# Patient Record
Sex: Female | Born: 1950 | Race: White | Hispanic: No | Marital: Married | Smoking: Former smoker
Health system: Southern US, Community
[De-identification: ages and names within clinical notes are randomized; demographics above are authoritative.]

## PROBLEM LIST (undated history)

## (undated) DIAGNOSIS — K219 Gastro-esophageal reflux disease without esophagitis: Secondary | ICD-10-CM

## (undated) DIAGNOSIS — T7840XA Allergy, unspecified, initial encounter: Secondary | ICD-10-CM

## (undated) DIAGNOSIS — Z87442 Personal history of urinary calculi: Secondary | ICD-10-CM

## (undated) DIAGNOSIS — F329 Major depressive disorder, single episode, unspecified: Secondary | ICD-10-CM

## (undated) DIAGNOSIS — J439 Emphysema, unspecified: Secondary | ICD-10-CM

## (undated) DIAGNOSIS — M797 Fibromyalgia: Secondary | ICD-10-CM

## (undated) DIAGNOSIS — M1990 Unspecified osteoarthritis, unspecified site: Secondary | ICD-10-CM

## (undated) DIAGNOSIS — F32A Depression, unspecified: Secondary | ICD-10-CM

## (undated) DIAGNOSIS — A6000 Herpesviral infection of urogenital system, unspecified: Secondary | ICD-10-CM

## (undated) DIAGNOSIS — Z8619 Personal history of other infectious and parasitic diseases: Secondary | ICD-10-CM

## (undated) DIAGNOSIS — K220 Achalasia of cardia: Secondary | ICD-10-CM

## (undated) HISTORY — DX: Gastro-esophageal reflux disease without esophagitis: K21.9

## (undated) HISTORY — DX: Emphysema, unspecified: J43.9

## (undated) HISTORY — DX: Allergy, unspecified, initial encounter: T78.40XA

## (undated) HISTORY — PX: LAPAROSCOPY: SHX197

---

## 1997-03-17 DIAGNOSIS — M159 Polyosteoarthritis, unspecified: Secondary | ICD-10-CM | POA: Insufficient documentation

## 1999-12-23 ENCOUNTER — Ambulatory Visit (HOSPITAL_COMMUNITY): Admission: RE | Admit: 1999-12-23 | Discharge: 1999-12-23 | Payer: Self-pay | Admitting: Neurosurgery

## 1999-12-23 ENCOUNTER — Encounter: Payer: Self-pay | Admitting: Neurosurgery

## 2004-01-02 ENCOUNTER — Ambulatory Visit: Payer: Self-pay | Admitting: Pain Medicine

## 2004-01-25 ENCOUNTER — Ambulatory Visit: Payer: Self-pay | Admitting: Pain Medicine

## 2004-02-28 ENCOUNTER — Ambulatory Visit: Payer: Self-pay | Admitting: Pain Medicine

## 2004-03-21 ENCOUNTER — Ambulatory Visit: Payer: Self-pay | Admitting: Pain Medicine

## 2004-04-23 ENCOUNTER — Ambulatory Visit: Payer: Self-pay | Admitting: Pain Medicine

## 2004-05-01 ENCOUNTER — Ambulatory Visit: Payer: Self-pay | Admitting: Pain Medicine

## 2004-05-23 ENCOUNTER — Ambulatory Visit: Payer: Self-pay | Admitting: Pain Medicine

## 2004-05-29 ENCOUNTER — Ambulatory Visit: Payer: Self-pay | Admitting: Pain Medicine

## 2004-06-20 ENCOUNTER — Ambulatory Visit: Payer: Self-pay | Admitting: Pain Medicine

## 2004-07-18 ENCOUNTER — Ambulatory Visit: Payer: Self-pay | Admitting: Pain Medicine

## 2004-08-05 ENCOUNTER — Ambulatory Visit: Payer: Self-pay | Admitting: Pain Medicine

## 2004-08-22 ENCOUNTER — Ambulatory Visit: Payer: Self-pay | Admitting: Pain Medicine

## 2004-08-28 ENCOUNTER — Ambulatory Visit: Payer: Self-pay | Admitting: Pain Medicine

## 2004-09-19 ENCOUNTER — Ambulatory Visit: Payer: Self-pay | Admitting: Pain Medicine

## 2004-10-17 ENCOUNTER — Ambulatory Visit: Payer: Self-pay | Admitting: Pain Medicine

## 2004-11-28 ENCOUNTER — Ambulatory Visit: Payer: Self-pay | Admitting: Pain Medicine

## 2004-12-23 ENCOUNTER — Ambulatory Visit: Payer: Self-pay | Admitting: Pain Medicine

## 2005-01-21 ENCOUNTER — Ambulatory Visit: Payer: Self-pay | Admitting: Pain Medicine

## 2005-02-26 ENCOUNTER — Ambulatory Visit: Payer: Self-pay | Admitting: Pain Medicine

## 2005-04-03 ENCOUNTER — Ambulatory Visit: Payer: Self-pay | Admitting: Pain Medicine

## 2005-05-06 ENCOUNTER — Ambulatory Visit: Payer: Self-pay | Admitting: Pain Medicine

## 2005-05-28 ENCOUNTER — Ambulatory Visit: Payer: Self-pay | Admitting: Pain Medicine

## 2005-07-01 ENCOUNTER — Ambulatory Visit: Payer: Self-pay | Admitting: Pain Medicine

## 2005-07-04 DIAGNOSIS — I959 Hypotension, unspecified: Secondary | ICD-10-CM | POA: Insufficient documentation

## 2005-07-24 ENCOUNTER — Ambulatory Visit: Payer: Self-pay | Admitting: Pain Medicine

## 2005-08-26 ENCOUNTER — Ambulatory Visit: Payer: Self-pay | Admitting: Pain Medicine

## 2005-09-25 ENCOUNTER — Ambulatory Visit: Payer: Self-pay | Admitting: Pain Medicine

## 2005-10-21 ENCOUNTER — Ambulatory Visit: Payer: Self-pay | Admitting: Pain Medicine

## 2005-11-27 ENCOUNTER — Emergency Department: Payer: Self-pay | Admitting: Emergency Medicine

## 2005-11-27 ENCOUNTER — Other Ambulatory Visit: Payer: Self-pay

## 2005-12-01 DIAGNOSIS — H8110 Benign paroxysmal vertigo, unspecified ear: Secondary | ICD-10-CM | POA: Insufficient documentation

## 2005-12-02 ENCOUNTER — Ambulatory Visit: Payer: Self-pay | Admitting: Pain Medicine

## 2005-12-22 ENCOUNTER — Ambulatory Visit: Payer: Self-pay | Admitting: Pain Medicine

## 2006-01-20 ENCOUNTER — Ambulatory Visit: Payer: Self-pay | Admitting: Pain Medicine

## 2006-01-26 ENCOUNTER — Ambulatory Visit: Payer: Self-pay | Admitting: Pain Medicine

## 2006-03-05 ENCOUNTER — Ambulatory Visit: Payer: Self-pay | Admitting: Pain Medicine

## 2006-04-13 ENCOUNTER — Ambulatory Visit: Payer: Self-pay | Admitting: Pain Medicine

## 2006-05-05 ENCOUNTER — Ambulatory Visit: Payer: Self-pay | Admitting: Pain Medicine

## 2006-06-09 ENCOUNTER — Ambulatory Visit: Payer: Self-pay | Admitting: Pain Medicine

## 2006-06-29 ENCOUNTER — Ambulatory Visit: Payer: Self-pay | Admitting: Pain Medicine

## 2006-07-16 ENCOUNTER — Ambulatory Visit: Payer: Self-pay | Admitting: Pain Medicine

## 2006-08-13 ENCOUNTER — Ambulatory Visit: Payer: Self-pay | Admitting: Pain Medicine

## 2006-09-07 ENCOUNTER — Ambulatory Visit: Payer: Self-pay

## 2006-09-10 ENCOUNTER — Ambulatory Visit: Payer: Self-pay | Admitting: Pain Medicine

## 2006-10-05 ENCOUNTER — Ambulatory Visit: Payer: Self-pay | Admitting: Pain Medicine

## 2006-11-10 ENCOUNTER — Ambulatory Visit: Payer: Self-pay | Admitting: Pain Medicine

## 2006-12-15 ENCOUNTER — Ambulatory Visit: Payer: Self-pay | Admitting: Pain Medicine

## 2006-12-29 DIAGNOSIS — F419 Anxiety disorder, unspecified: Secondary | ICD-10-CM | POA: Insufficient documentation

## 2007-01-12 ENCOUNTER — Ambulatory Visit: Payer: Self-pay | Admitting: Pain Medicine

## 2007-02-09 ENCOUNTER — Ambulatory Visit: Payer: Self-pay | Admitting: Pain Medicine

## 2007-03-16 ENCOUNTER — Ambulatory Visit: Payer: Self-pay | Admitting: Pain Medicine

## 2007-04-14 ENCOUNTER — Ambulatory Visit: Payer: Self-pay | Admitting: Pain Medicine

## 2007-05-25 ENCOUNTER — Ambulatory Visit: Payer: Self-pay | Admitting: Pain Medicine

## 2007-06-24 ENCOUNTER — Ambulatory Visit: Payer: Self-pay | Admitting: Pain Medicine

## 2007-07-28 ENCOUNTER — Ambulatory Visit: Payer: Self-pay | Admitting: Pain Medicine

## 2007-08-24 ENCOUNTER — Ambulatory Visit: Payer: Self-pay | Admitting: Pain Medicine

## 2007-09-28 ENCOUNTER — Ambulatory Visit: Payer: Self-pay | Admitting: Pain Medicine

## 2007-10-26 ENCOUNTER — Ambulatory Visit: Payer: Self-pay | Admitting: Pain Medicine

## 2007-11-23 ENCOUNTER — Ambulatory Visit: Payer: Self-pay | Admitting: Pain Medicine

## 2007-12-21 ENCOUNTER — Ambulatory Visit: Payer: Self-pay | Admitting: Pain Medicine

## 2008-01-20 ENCOUNTER — Ambulatory Visit: Payer: Self-pay | Admitting: Pain Medicine

## 2008-01-28 ENCOUNTER — Emergency Department: Payer: Self-pay | Admitting: Unknown Physician Specialty

## 2008-03-14 ENCOUNTER — Ambulatory Visit: Payer: Self-pay | Admitting: Pain Medicine

## 2008-04-11 ENCOUNTER — Ambulatory Visit: Payer: Self-pay | Admitting: Pain Medicine

## 2008-05-11 ENCOUNTER — Ambulatory Visit: Payer: Self-pay | Admitting: Pain Medicine

## 2008-06-08 ENCOUNTER — Ambulatory Visit: Payer: Self-pay | Admitting: Pain Medicine

## 2008-07-11 ENCOUNTER — Ambulatory Visit: Payer: Self-pay | Admitting: Pain Medicine

## 2008-08-08 ENCOUNTER — Ambulatory Visit: Payer: Self-pay | Admitting: Pain Medicine

## 2008-08-09 ENCOUNTER — Ambulatory Visit: Payer: Self-pay | Admitting: Pain Medicine

## 2008-09-07 ENCOUNTER — Ambulatory Visit: Payer: Self-pay | Admitting: Pain Medicine

## 2008-10-04 ENCOUNTER — Ambulatory Visit: Payer: Self-pay | Admitting: Pain Medicine

## 2008-10-16 ENCOUNTER — Ambulatory Visit: Payer: Self-pay | Admitting: Pain Medicine

## 2008-12-05 ENCOUNTER — Ambulatory Visit: Payer: Self-pay | Admitting: Pain Medicine

## 2008-12-26 ENCOUNTER — Ambulatory Visit: Payer: Self-pay | Admitting: Pain Medicine

## 2009-02-06 ENCOUNTER — Ambulatory Visit: Payer: Self-pay | Admitting: Pain Medicine

## 2009-03-06 ENCOUNTER — Ambulatory Visit: Payer: Self-pay | Admitting: Pain Medicine

## 2009-04-04 ENCOUNTER — Ambulatory Visit: Payer: Self-pay | Admitting: Pain Medicine

## 2009-05-03 ENCOUNTER — Ambulatory Visit: Payer: Self-pay | Admitting: Pain Medicine

## 2009-05-31 ENCOUNTER — Ambulatory Visit: Payer: Self-pay | Admitting: Pain Medicine

## 2009-06-04 ENCOUNTER — Ambulatory Visit: Payer: Self-pay | Admitting: Pain Medicine

## 2009-06-28 ENCOUNTER — Ambulatory Visit: Payer: Self-pay | Admitting: Pain Medicine

## 2009-07-23 ENCOUNTER — Ambulatory Visit: Payer: Self-pay | Admitting: Internal Medicine

## 2009-08-02 ENCOUNTER — Ambulatory Visit: Payer: Self-pay | Admitting: Pain Medicine

## 2009-08-30 ENCOUNTER — Ambulatory Visit: Payer: Self-pay | Admitting: Pain Medicine

## 2009-09-12 ENCOUNTER — Ambulatory Visit: Payer: Self-pay | Admitting: Pain Medicine

## 2009-10-02 ENCOUNTER — Ambulatory Visit: Payer: Self-pay | Admitting: Pain Medicine

## 2009-10-24 ENCOUNTER — Ambulatory Visit: Payer: Self-pay | Admitting: Pain Medicine

## 2009-11-27 ENCOUNTER — Ambulatory Visit: Payer: Self-pay | Admitting: Pain Medicine

## 2009-12-05 ENCOUNTER — Ambulatory Visit: Payer: Self-pay | Admitting: Pain Medicine

## 2010-01-24 ENCOUNTER — Ambulatory Visit: Payer: Self-pay | Admitting: Pain Medicine

## 2010-01-24 ENCOUNTER — Ambulatory Visit: Payer: Self-pay | Admitting: Family Medicine

## 2010-01-29 ENCOUNTER — Ambulatory Visit: Payer: Self-pay | Admitting: Internal Medicine

## 2010-02-28 ENCOUNTER — Ambulatory Visit: Payer: Self-pay | Admitting: Pain Medicine

## 2010-03-28 ENCOUNTER — Ambulatory Visit: Payer: Self-pay | Admitting: Pain Medicine

## 2010-04-30 ENCOUNTER — Ambulatory Visit: Payer: Self-pay | Admitting: Pain Medicine

## 2010-05-20 ENCOUNTER — Ambulatory Visit: Payer: Self-pay | Admitting: Pain Medicine

## 2010-06-25 ENCOUNTER — Ambulatory Visit: Payer: Self-pay | Admitting: Pain Medicine

## 2010-07-25 ENCOUNTER — Ambulatory Visit: Payer: Self-pay | Admitting: Pain Medicine

## 2010-08-19 ENCOUNTER — Ambulatory Visit: Payer: Self-pay | Admitting: Pain Medicine

## 2010-09-24 ENCOUNTER — Ambulatory Visit: Payer: Self-pay | Admitting: Pain Medicine

## 2010-10-02 ENCOUNTER — Ambulatory Visit: Payer: Self-pay | Admitting: Pain Medicine

## 2010-10-24 ENCOUNTER — Ambulatory Visit: Payer: Self-pay | Admitting: Pain Medicine

## 2010-10-28 ENCOUNTER — Ambulatory Visit: Payer: Self-pay | Admitting: Pain Medicine

## 2010-11-19 ENCOUNTER — Ambulatory Visit: Payer: Self-pay | Admitting: Pain Medicine

## 2010-12-17 ENCOUNTER — Ambulatory Visit: Payer: Self-pay | Admitting: Pain Medicine

## 2010-12-25 ENCOUNTER — Ambulatory Visit: Payer: Self-pay | Admitting: Pain Medicine

## 2010-12-26 ENCOUNTER — Ambulatory Visit: Payer: Self-pay | Admitting: Family Medicine

## 2011-01-03 ENCOUNTER — Ambulatory Visit: Payer: Self-pay | Admitting: Specialist

## 2011-01-21 ENCOUNTER — Ambulatory Visit: Payer: Self-pay | Admitting: Pain Medicine

## 2011-02-20 ENCOUNTER — Ambulatory Visit: Payer: Self-pay | Admitting: Pain Medicine

## 2011-03-25 ENCOUNTER — Ambulatory Visit: Payer: Self-pay | Admitting: Pain Medicine

## 2011-04-25 ENCOUNTER — Ambulatory Visit: Payer: Self-pay | Admitting: Pain Medicine

## 2011-05-21 ENCOUNTER — Ambulatory Visit: Payer: Self-pay | Admitting: Pain Medicine

## 2011-06-17 ENCOUNTER — Ambulatory Visit: Payer: Self-pay | Admitting: Pain Medicine

## 2011-06-30 ENCOUNTER — Ambulatory Visit: Payer: Self-pay | Admitting: Pain Medicine

## 2011-07-22 ENCOUNTER — Ambulatory Visit: Payer: Self-pay | Admitting: Pain Medicine

## 2011-08-21 ENCOUNTER — Ambulatory Visit: Payer: Self-pay | Admitting: Pain Medicine

## 2011-09-16 ENCOUNTER — Ambulatory Visit: Payer: Self-pay | Admitting: Pain Medicine

## 2011-10-16 ENCOUNTER — Ambulatory Visit: Payer: Self-pay | Admitting: Pain Medicine

## 2011-11-18 ENCOUNTER — Ambulatory Visit: Payer: Self-pay | Admitting: Pain Medicine

## 2011-12-18 ENCOUNTER — Ambulatory Visit: Payer: Self-pay | Admitting: Pain Medicine

## 2012-01-15 ENCOUNTER — Ambulatory Visit: Payer: Self-pay | Admitting: Pain Medicine

## 2012-02-17 ENCOUNTER — Ambulatory Visit: Payer: Self-pay | Admitting: Pain Medicine

## 2012-03-16 ENCOUNTER — Ambulatory Visit: Payer: Self-pay | Admitting: Pain Medicine

## 2012-04-15 ENCOUNTER — Ambulatory Visit: Payer: Self-pay | Admitting: Pain Medicine

## 2012-05-17 ENCOUNTER — Ambulatory Visit: Payer: Self-pay | Admitting: Pain Medicine

## 2012-06-08 ENCOUNTER — Ambulatory Visit: Payer: Self-pay | Admitting: Pain Medicine

## 2012-07-15 ENCOUNTER — Ambulatory Visit: Payer: Self-pay | Admitting: Pain Medicine

## 2012-08-16 ENCOUNTER — Ambulatory Visit: Payer: Self-pay | Admitting: Pain Medicine

## 2012-08-27 ENCOUNTER — Ambulatory Visit: Payer: Self-pay | Admitting: Specialist

## 2012-09-14 ENCOUNTER — Ambulatory Visit: Payer: Self-pay | Admitting: Pain Medicine

## 2012-10-13 ENCOUNTER — Ambulatory Visit: Payer: Self-pay | Admitting: Pain Medicine

## 2012-11-09 ENCOUNTER — Ambulatory Visit: Payer: Self-pay | Admitting: Pain Medicine

## 2012-12-07 ENCOUNTER — Ambulatory Visit: Payer: Self-pay | Admitting: Pain Medicine

## 2012-12-27 ENCOUNTER — Ambulatory Visit: Payer: Self-pay | Admitting: Pain Medicine

## 2013-01-04 ENCOUNTER — Ambulatory Visit: Payer: Self-pay | Admitting: Pain Medicine

## 2013-02-03 ENCOUNTER — Ambulatory Visit: Payer: Self-pay | Admitting: Pain Medicine

## 2013-03-07 ENCOUNTER — Ambulatory Visit: Payer: Self-pay | Admitting: Pain Medicine

## 2013-04-06 ENCOUNTER — Ambulatory Visit: Payer: Self-pay | Admitting: Pain Medicine

## 2013-04-18 ENCOUNTER — Ambulatory Visit: Payer: Self-pay | Admitting: Pain Medicine

## 2013-05-05 ENCOUNTER — Ambulatory Visit: Payer: Self-pay | Admitting: Pain Medicine

## 2013-05-09 ENCOUNTER — Ambulatory Visit: Payer: Self-pay | Admitting: Unknown Physician Specialty

## 2013-05-10 DIAGNOSIS — D126 Benign neoplasm of colon, unspecified: Secondary | ICD-10-CM | POA: Insufficient documentation

## 2013-05-11 LAB — PATHOLOGY REPORT

## 2013-06-02 ENCOUNTER — Ambulatory Visit: Payer: Self-pay | Admitting: Pain Medicine

## 2013-06-02 ENCOUNTER — Ambulatory Visit: Payer: Self-pay | Admitting: Unknown Physician Specialty

## 2013-06-15 ENCOUNTER — Ambulatory Visit: Payer: Self-pay | Admitting: Surgery

## 2013-06-22 ENCOUNTER — Ambulatory Visit: Payer: Self-pay | Admitting: Gastroenterology

## 2013-06-29 DIAGNOSIS — K220 Achalasia of cardia: Secondary | ICD-10-CM | POA: Insufficient documentation

## 2013-07-05 ENCOUNTER — Ambulatory Visit: Payer: Self-pay | Admitting: Pain Medicine

## 2013-07-06 ENCOUNTER — Ambulatory Visit: Payer: Self-pay | Admitting: Pain Medicine

## 2013-08-04 ENCOUNTER — Ambulatory Visit: Payer: Self-pay | Admitting: Pain Medicine

## 2013-08-18 DIAGNOSIS — M1990 Unspecified osteoarthritis, unspecified site: Secondary | ICD-10-CM | POA: Insufficient documentation

## 2013-08-24 DIAGNOSIS — M797 Fibromyalgia: Secondary | ICD-10-CM | POA: Insufficient documentation

## 2013-08-24 DIAGNOSIS — F329 Major depressive disorder, single episode, unspecified: Secondary | ICD-10-CM | POA: Insufficient documentation

## 2013-08-24 DIAGNOSIS — J449 Chronic obstructive pulmonary disease, unspecified: Secondary | ICD-10-CM | POA: Insufficient documentation

## 2013-08-24 DIAGNOSIS — F32A Depression, unspecified: Secondary | ICD-10-CM | POA: Insufficient documentation

## 2013-09-01 ENCOUNTER — Ambulatory Visit: Payer: Self-pay | Admitting: Pain Medicine

## 2013-09-29 ENCOUNTER — Ambulatory Visit: Payer: Self-pay | Admitting: Pain Medicine

## 2013-10-05 ENCOUNTER — Ambulatory Visit: Payer: Self-pay | Admitting: Pain Medicine

## 2013-10-27 ENCOUNTER — Ambulatory Visit: Payer: Self-pay | Admitting: Pain Medicine

## 2013-11-24 ENCOUNTER — Ambulatory Visit: Payer: Self-pay | Admitting: Pain Medicine

## 2013-12-22 ENCOUNTER — Ambulatory Visit: Payer: Self-pay | Admitting: Pain Medicine

## 2014-01-24 ENCOUNTER — Ambulatory Visit: Payer: Self-pay | Admitting: Pain Medicine

## 2014-02-23 ENCOUNTER — Ambulatory Visit: Payer: Self-pay | Admitting: Pain Medicine

## 2014-03-28 ENCOUNTER — Ambulatory Visit: Payer: Self-pay | Admitting: Pain Medicine

## 2014-04-10 ENCOUNTER — Ambulatory Visit: Payer: Self-pay | Admitting: Pain Medicine

## 2014-04-27 ENCOUNTER — Ambulatory Visit: Payer: Self-pay | Admitting: Pain Medicine

## 2014-05-22 ENCOUNTER — Ambulatory Visit: Payer: Self-pay | Admitting: Pain Medicine

## 2014-05-29 ENCOUNTER — Ambulatory Visit: Payer: Self-pay | Admitting: Pain Medicine

## 2014-06-20 ENCOUNTER — Ambulatory Visit
Admit: 2014-06-20 | Disposition: A | Discharge status: Home / Self Care | Payer: Self-pay | Attending: Pain Medicine | Admitting: Pain Medicine

## 2014-07-18 ENCOUNTER — Encounter: Payer: Self-pay | Admitting: Pain Medicine

## 2014-07-18 ENCOUNTER — Ambulatory Visit: Payer: BC Managed Care – PPO | Attending: Pain Medicine | Admitting: Pain Medicine

## 2014-07-18 VITALS — BP 133/66 | HR 75 | Temp 98.1°F | Resp 18 | Ht 65.0 in | Wt 150.0 lb

## 2014-07-18 DIAGNOSIS — M5135 Other intervertebral disc degeneration, thoracolumbar region: Secondary | ICD-10-CM | POA: Insufficient documentation

## 2014-07-18 DIAGNOSIS — M51369 Other intervertebral disc degeneration, lumbar region without mention of lumbar back pain or lower extremity pain: Secondary | ICD-10-CM

## 2014-07-18 DIAGNOSIS — M19011 Primary osteoarthritis, right shoulder: Secondary | ICD-10-CM

## 2014-07-18 DIAGNOSIS — M19019 Primary osteoarthritis, unspecified shoulder: Secondary | ICD-10-CM | POA: Diagnosis not present

## 2014-07-18 DIAGNOSIS — M5134 Other intervertebral disc degeneration, thoracic region: Secondary | ICD-10-CM | POA: Diagnosis not present

## 2014-07-18 DIAGNOSIS — M79605 Pain in left leg: Secondary | ICD-10-CM | POA: Diagnosis present

## 2014-07-18 DIAGNOSIS — M170 Bilateral primary osteoarthritis of knee: Secondary | ICD-10-CM

## 2014-07-18 DIAGNOSIS — M1710 Unilateral primary osteoarthritis, unspecified knee: Secondary | ICD-10-CM | POA: Insufficient documentation

## 2014-07-18 DIAGNOSIS — M5136 Other intervertebral disc degeneration, lumbar region: Secondary | ICD-10-CM | POA: Diagnosis not present

## 2014-07-18 DIAGNOSIS — M19012 Primary osteoarthritis, left shoulder: Secondary | ICD-10-CM

## 2014-07-18 DIAGNOSIS — M1990 Unspecified osteoarthritis, unspecified site: Secondary | ICD-10-CM | POA: Insufficient documentation

## 2014-07-18 DIAGNOSIS — M179 Osteoarthritis of knee, unspecified: Secondary | ICD-10-CM | POA: Insufficient documentation

## 2014-07-18 MED ORDER — OXYCODONE HCL 5 MG PO TABS: 5.0000 mg | ORAL_TABLET | ORAL | Status: DC | PRN

## 2014-07-18 MED ORDER — OXYCODONE HCL ER 10 MG PO T12A: 10.0000 mg | EXTENDED_RELEASE_TABLET | Freq: Every morning | ORAL | Status: DC

## 2014-07-18 MED ORDER — OXYCODONE HCL ER 10 MG PO T12A: 10.0000 mg | EXTENDED_RELEASE_TABLET | Freq: Three times a day (TID) | ORAL | Status: DC

## 2014-07-18 NOTE — Progress Notes (Signed)
   Subjective:    Patient ID: Katrina Simon, female    DOB: 1950-04-22, 64 y.o.   MRN: [id number]  Knee Pain  The incident occurred more than 1 week ago. There was no injury mechanism. The pain is present in the left leg, left hip, left thigh, left knee, left ankle, left heel, right hip, right thigh, right knee, left foot, left toes, right leg, right ankle, right heel, right foot and right toes. The quality of the pain is described as aching. The pain is at a severity of 6/10. The pain is moderate. The pain has been constant since onset. Associated symptoms include an inability to bear weight, a loss of motion and numbness. The symptoms are aggravated by movement, palpation and weight bearing. She has tried acetaminophen, immobilization, elevation, heat, ice, non-weight bearing, NSAIDs and rest for the symptoms. The treatment provided moderate relief.      Review of Systems  Cardiovascular: Positive for leg swelling.  Musculoskeletal: Positive for myalgias, back pain, joint swelling, arthralgias and gait problem.  Neurological: Positive for weakness and numbness.       Objective:   Physical Exam  Constitutional: She is oriented to person, place, and time.  HENT:  Head: Normocephalic.  Cardiovascular: Normal rate.   Musculoskeletal: She exhibits edema and tenderness.  Neurological: She is alert and oriented to person, place, and time.          Assessment & Plan:   Patient Active Problem List   Diagnosis Date Noted  . DDD (degenerative disc disease), lumbar 07/18/2014  . DDD (degenerative disc disease), thoracolumbar 07/18/2014  . DJD (degenerative joint disease) of knee 07/18/2014  . DJD (degenerative joint disease) shoulder, sacroiliac joint  07/18/2014   Geniculate nerves of knee

## 2014-07-18 NOTE — Patient Instructions (Addendum)
Trigger Point Injections Patient Information  Description: Trigger points are areas of muscle sensitive to touch which cause pain with movement, sometimes felt some distance from the site of palpation.  Usually the muscle containing these trigger points if felt as a tight band or knot.   The area of maximum tenderness or trigger point is identified, and after antiseptic preparation of the skin, a small needle is placed into this site.  Reproduction of the pain often occurs and numbing medicine (local anesthetic) is injected into the site, sometimes along with steroid preparation.  The entire block usually lasts less than 5 minutes.  Conditions which may be treated by trigger points:   Muscular pain and spasm  Nerve irritation  Preparation for the injection:  1. Do not eat any solid food or dairy products within 6 hours of your appointment. 2. You may drink clear liquids up to 2 hours before appointment.  Clear liquids include water, black coffee, juice or soda.  No milk or cream please. 3. You may take your regular medications, including pain medications, with a sip of water before your appointment.  Diabetics should hold regular insulin ( if take separately) and take 1/2 normal NPH dose the morning of the procedure.  Carry some sugar containing items with you to your appointment. 4. A driver must accompany you and be prepared to drive you home after your procedure.  5. Bring all your current medications with you. 6. An IV may be inserted and sedation may be given at the discretion of the physician.  7. A blood pressure cuff, EKG, and other monitors will often be applied during the procedure.  Some patients may need to have extra oxygen administered for a short period. 8. You will be asked to provide medical information, including your allergies and medications, prior to the procedure.  We must know immediately if you are taking blood thinners (like Coumadin/Warfarin) or if you are allergic to  IV iodine contrast (dye).  We must know if you could possibly be pregnant.  Possible side-effects:   Bleeding from needle site  Infection (rare, may require surgery)  Nerve injury (rare)  Numbness & tingling (temporary)  Punctured lung (if injection around chest)  Light-headedness (temporary)  Pain at injection site (several days)  Decreased blood pressure (rare, temporary)  Weakness in arm/leg (temporary)  Call if you experience:   Hive or difficulty breathing (go to the emergency room)  Inflammation or drainage at the injection site(s)  Teach back 3 done  Please note:  Although the local anesthetic injected can often make your painful muscle feel good for several hours after the injection, the pain may return.  It takes 3-7 days for steroids to work.  You may not notice any pain relief for at least one week.  If effective, we will often do a series of injections spaced 3-6 weeks apart to maximally decrease your pain.  If you have any questions please call [phone] [facility]

## 2014-07-19 NOTE — Addendum Note (Signed)
Addended by: Morley Kos on: 07/19/2014 04:04 PM   Modules accepted: Level of Service

## 2014-07-25 ENCOUNTER — Ambulatory Visit: Payer: BC Managed Care – PPO | Admitting: Pain Medicine

## 2014-07-26 ENCOUNTER — Ambulatory Visit: Payer: BC Managed Care – PPO | Attending: Pain Medicine | Admitting: Pain Medicine

## 2014-07-26 ENCOUNTER — Encounter: Payer: Self-pay | Admitting: Pain Medicine

## 2014-07-26 ENCOUNTER — Other Ambulatory Visit: Payer: Self-pay | Admitting: Pain Medicine

## 2014-07-26 VITALS — BP 128/66 | HR 85 | Temp 98.0°F | Resp 14 | Ht 65.0 in | Wt 154.0 lb

## 2014-07-26 DIAGNOSIS — M170 Bilateral primary osteoarthritis of knee: Secondary | ICD-10-CM

## 2014-07-26 DIAGNOSIS — M545 Low back pain: Secondary | ICD-10-CM | POA: Diagnosis not present

## 2014-07-26 DIAGNOSIS — M1711 Unilateral primary osteoarthritis, right knee: Secondary | ICD-10-CM | POA: Insufficient documentation

## 2014-07-26 MED ORDER — MIDAZOLAM HCL 5 MG/5ML IJ SOLN
INTRAMUSCULAR | Status: AC
  Administered 2014-07-26: 5 mg
  Filled 2014-07-26: qty 5

## 2014-07-26 MED ORDER — FENTANYL CITRATE (PF) 100 MCG/2ML IJ SOLN
INTRAMUSCULAR | Status: AC
  Administered 2014-07-26: 100 ug
  Filled 2014-07-26: qty 2

## 2014-07-26 MED ORDER — LEVOFLOXACIN IN D5W 250 MG/50ML IV SOLN
INTRAVENOUS | Status: AC
  Administered 2014-07-26: 08:00:00
  Filled 2014-07-26: qty 50

## 2014-07-26 MED ORDER — BUPIVACAINE HCL (PF) 0.25 % IJ SOLN
INTRAMUSCULAR | Status: AC
  Administered 2014-07-26: 08:00:00
  Filled 2014-07-26: qty 30

## 2014-07-26 MED ORDER — TRIAMCINOLONE ACETONIDE 40 MG/ML IJ SUSP
INTRAMUSCULAR | Status: AC
  Administered 2014-07-26: 08:00:00
  Filled 2014-07-26: qty 1

## 2014-07-26 NOTE — Progress Notes (Deleted)
MR:  08/25/2014 UDS:  07/18/2014

## 2014-07-26 NOTE — Patient Instructions (Addendum)
Continue present medications and antibiotics  F/U PCP for evaliation of  BP and general medical  Condition.  F/U surgical evaluation.  F/U neurological evaluation.  May consider radiofrequency rhizolysis or intraspinal procedures pending response to present treatment and F/U evaluation.  Patient to call Pain Management Center should patient have concerns prior to scheduled return appointment.     Knee Injection Joint injections are shots. Your caregiver will place a needle into your knee joint. The needle is used to put medicine into the joint. These shots can be used to help treat different painful knee conditions such as osteoarthritis, bursitis, local flare-ups of rheumatoid arthritis, and pseudogout. Anti-inflammatory medicines such as corticosteroids and anesthetics are the most common medicines used for joint and soft tissue injections.  PROCEDURE  The skin over the kneecap will be cleaned with an antiseptic solution.  Your caregiver will inject a small amount of a local anesthetic (a medicine like Novocaine) just under the skin in the area that was cleaned.  After the area becomes numb, a second injection is done. This second injection usually includes an anesthetic and an anti-inflammatory medicine called a steroid or cortisone. The needle is carefully placed in between the kneecap and the knee, and the medicine is injected into the joint space.  After the injection is done, the needle is removed. Your caregiver may place a bandage over the injection site. The whole procedure takes no more than a couple of minutes. BEFORE THE PROCEDURE  Wash all of the skin around the entire knee area. Try to remove any loose, scaling skin. There is no other specific preparation necessary unless advised otherwise by your caregiver. LET YOUR CAREGIVER KNOW ABOUT:   Allergies.  Medications taken including herbs, eye drops, over the counter medications, and creams.  Use of steroids (by mouth or  creams).  Possible pregnancy, if applicable.  Previous problems with anesthetics or Novocaine.  History of blood clots (thrombophlebitis).  History of bleeding or blood problems.  Previous surgery.  Other health problems. RISKS AND COMPLICATIONS Side effects from cortisone shots are rare. They include:   Slight bruising of the skin.  Shrinkage of the normal fatty tissue under the skin where the shot was given.  Increase in pain after the shot.  Infection.  Weakening of tendons or tendon rupture.  Allergic reaction to the medicine.  Diabetics may have a temporary increase in their blood sugar after a shot.  Cortisone can temporarily weaken the immune system. While receiving these shots, you should not get certain vaccines. Also, avoid contact with anyone who has chickenpox or measles. Especially if you have never had these diseases or have not been previously immunized. Your immune system may not be strong enough to fight off the infection while the cortisone is in your system. AFTER THE PROCEDURE   You can go home after the procedure.  You may need to put ice on the joint 15-20 minutes every 3 or 4 hours until the pain goes away.  You may need to put an elastic bandage on the joint. HOME CARE INSTRUCTIONS   Only take over-the-counter or prescription medicines for pain, discomfort, or fever as directed by your caregiver.  You should avoid stressing the joint. Unless advised otherwise, avoid activities that put a lot of pressure on a knee joint, such as:  Jogging.  Bicycling.  Recreational climbing.  Hiking.  Laying down and elevating the leg/knee above the level of your heart can help to minimize swelling. SEEK MEDICAL CARE IF:  You have repeated or worsening swelling.  There is drainage from the puncture area.  You develop red streaking that extends above or below the site where the needle was inserted. SEEK IMMEDIATE MEDICAL CARE IF:   You develop a  fever.  You have pain that gets worse even though you are taking pain medicine.  The area is red and warm, and you have trouble moving the joint. MAKE SURE YOU:   Understand these instructions.  Will watch your condition.  Will get help right away if you are not doing well or get worse. Document Released: 05/25/2006 Document Revised: 05/26/2011 Document Reviewed: 02/19/2007 [facility] Patient Information 2015 [place], [place]. This information is not intended to replace advice given to you by your health care provider. Make sure you discuss any questions you have with your health care provider. Pain Management Discharge Instructions  General Discharge Instructions :  If you need to reach your doctor call: Monday-Friday 8:00 am - 4:00 pm at [phone] or toll free [phone].  After clinic hours [phone] to have operator reach doctor.  Bring all of your medication bottles to all your appointments in the pain clinic.  To cancel or reschedule your appointment with Pain Management please remember to call 24 hours in advance to avoid a fee.  Refer to the educational materials which you have been given on: General Risks, I had my Procedure. Discharge Instructions, Post Sedation.  Post Procedure Instructions:  The drugs you were given will stay in your system until tomorrow, so for the next 24 hours you should not drive, make any legal decisions or drink any alcoholic beverages.  You may eat anything you prefer, but it is better to start with liquids then soups and crackers, and gradually work up to solid foods.  Please notify your doctor immediately if you have any unusual bleeding, trouble breathing or pain that is not related to your normal pain.  Depending on the type of procedure that was done, some parts of your body may feel week and/or numb.  This usually clears up by tonight or the next day.  Walk with the use of an assistive device or accompanied by an adult for the 24  hours.  You may use ice on the affected area for the first 24 hours.  Put ice in a Ziploc bag and cover with a towel and place against area 15 minutes on 15 minutes off.  You may switch to heat after 24 hours.

## 2014-07-26 NOTE — Progress Notes (Signed)
Patient is a 64 year old female who returns to pain management Center for further evaluation and treatment of pain involving the lower back and lower extremity regions with severe pain involving the region of the right knee patient significant degenerative changes of the right knee. We will proceed with geniculate nerve block of the right knee in attempt to decrease severity of patient's symptoms minimize the risk of medication escalation hopefully retard progression of patient's symptoms and avoid the need for more involved treatment this patient. Expectations of the procedure were discussed and explained the patient was understanding and in agreement with suggested treatment plan   Geniculate nerve blocks of the right knee  Patient was taken to fluoroscopy suite. Patient in supine position with EKG blood pressure, pulse, and pulse oximetry monitoring IN place. Patient was administered IV Versed and IV fentanyl for moderate conscious sedation. Betadine prep proposed entry site accomplished and local anesthetic skin wheal to proposed entry site accomplished  Needle placement for block of the superior lateral geniculate nerve  The patient in supine position, and under fluoroscopic guidance local anesthetic skin wheal was accomplished with 1-1/2% lidocaine to proposed needle entry site a 22-gauge spinal needle was inserted at the junction of the shaft of the femur and condyle of the femur lateral aspect. Following needle placement at the junction of the shaft of the femur and condyle of the femur on the lateral side needle placement was accomplished on the medial side utilizing the same technique.  Needle placement for block of the superior medial geniculate nerve  The technique was repeated as was performed for needle placement for block of the superior lateral geniculate nerve.  Needle placement block of the inferior medial geniculate nerve  The patient in the supine position and under fluoroscopic  guidance, local anesthetic skin wheal of 1-1/2% lidocaine was injected at the proposed needle entry site. A 22-gauge needle was inserted at the junction of the shaft of the tibia and condyle of the tibia medial aspect of the tibia. Following needle placement at the junction of the shaft of the tibia and condyle of tibia, needle placement was then verified on lateral view for needle placements for the superior geniculate nerve needles in the inferior geniculate nerve needle.  Lateral view of the shaft of the femur revealed the needle tips of the needles positioned for block of the superior geniculate nerves were noted to be one half the distance the shaft of the femur. Lateral view of the shaft of the tibia revealed the needle tip of the needle position for block of the inferior geniculate nerve is noted to be one half the distance the shaft of the tibia. Each needle was injected with 1 cc of Pepcid bupivacaine with Kenalog. A total of 10 mg of Kenalog was used for the procedure. The patient tolerated the procedure well  Plan  Continue present medications oxycodone and OxyContin  F/U PCP for evaliation of  BP and general medical  condition.  F/U surgical evaluation.  F/U nrurological evaluation.  May consider radiofrequency rhizolysis or intraspinal procedures pending response to present treatment and F/U evaluation.  Patient to call Pain Management Center should patient have concerns prior to scheduled return appointment.

## 2014-07-26 NOTE — Progress Notes (Signed)
Discharged via w/c at  0910 . Tolerating po fluids well. Instructions (verbal and written) given to patient. Teachback x3.

## 2014-07-27 ENCOUNTER — Telehealth: Payer: Self-pay | Admitting: *Deleted

## 2014-07-27 NOTE — Addendum Note (Signed)
Addended by: Dewayne Shorter on: 07/27/2014 10:52 AM   Modules accepted: Orders

## 2014-07-27 NOTE — Telephone Encounter (Signed)
Patient denies any problems after procedure.

## 2014-08-09 ENCOUNTER — Other Ambulatory Visit: Payer: Self-pay | Admitting: Pain Medicine

## 2014-08-20 ENCOUNTER — Other Ambulatory Visit: Payer: Self-pay | Admitting: Pain Medicine

## 2014-08-20 DIAGNOSIS — M5136 Other intervertebral disc degeneration, lumbar region: Secondary | ICD-10-CM

## 2014-08-20 DIAGNOSIS — M5135 Other intervertebral disc degeneration, thoracolumbar region: Secondary | ICD-10-CM

## 2014-08-20 DIAGNOSIS — M170 Bilateral primary osteoarthritis of knee: Secondary | ICD-10-CM

## 2014-08-20 DIAGNOSIS — M159 Polyosteoarthritis, unspecified: Secondary | ICD-10-CM

## 2014-08-20 DIAGNOSIS — M150 Primary generalized (osteo)arthritis: Secondary | ICD-10-CM

## 2014-08-23 ENCOUNTER — Ambulatory Visit: Payer: BC Managed Care – PPO | Attending: Pain Medicine | Admitting: Pain Medicine

## 2014-08-23 VITALS — BP 139/65 | HR 65 | Temp 98.0°F | Resp 14 | Ht 65.0 in | Wt 155.0 lb

## 2014-08-23 DIAGNOSIS — M545 Low back pain: Secondary | ICD-10-CM | POA: Diagnosis present

## 2014-08-23 DIAGNOSIS — M19019 Primary osteoarthritis, unspecified shoulder: Secondary | ICD-10-CM | POA: Insufficient documentation

## 2014-08-23 DIAGNOSIS — M19012 Primary osteoarthritis, left shoulder: Secondary | ICD-10-CM | POA: Diagnosis not present

## 2014-08-23 DIAGNOSIS — M5136 Other intervertebral disc degeneration, lumbar region: Secondary | ICD-10-CM | POA: Insufficient documentation

## 2014-08-23 DIAGNOSIS — M461 Sacroiliitis, not elsewhere classified: Secondary | ICD-10-CM | POA: Insufficient documentation

## 2014-08-23 DIAGNOSIS — M170 Bilateral primary osteoarthritis of knee: Secondary | ICD-10-CM | POA: Insufficient documentation

## 2014-08-23 DIAGNOSIS — M47816 Spondylosis without myelopathy or radiculopathy, lumbar region: Secondary | ICD-10-CM | POA: Diagnosis not present

## 2014-08-23 DIAGNOSIS — M79605 Pain in left leg: Secondary | ICD-10-CM | POA: Diagnosis present

## 2014-08-23 DIAGNOSIS — M533 Sacrococcygeal disorders, not elsewhere classified: Secondary | ICD-10-CM | POA: Insufficient documentation

## 2014-08-23 DIAGNOSIS — M79604 Pain in right leg: Secondary | ICD-10-CM | POA: Diagnosis present

## 2014-08-23 DIAGNOSIS — M159 Polyosteoarthritis, unspecified: Secondary | ICD-10-CM

## 2014-08-23 DIAGNOSIS — M19011 Primary osteoarthritis, right shoulder: Secondary | ICD-10-CM | POA: Insufficient documentation

## 2014-08-23 DIAGNOSIS — M150 Primary generalized (osteo)arthritis: Secondary | ICD-10-CM

## 2014-08-23 DIAGNOSIS — M5135 Other intervertebral disc degeneration, thoracolumbar region: Secondary | ICD-10-CM

## 2014-08-23 MED ORDER — OXYCODONE HCL 5 MG PO TABS: ORAL_TABLET | ORAL | Status: DC

## 2014-08-23 MED ORDER — OXYCODONE HCL ER 10 MG PO T12A: EXTENDED_RELEASE_TABLET | ORAL | Status: DC

## 2014-08-23 NOTE — Progress Notes (Signed)
Discharged to home ambulatory with script in hand for oxycodone 5 mg and oxycodone 10 mg.  Pre procedure instructions given with teach back 3 done.

## 2014-08-23 NOTE — Progress Notes (Signed)
   Subjective:    Patient ID: Katrina Simon, female    DOB: 11-Dec-1950, 64 y.o.   MRN: [id number]  HPI  Patient is 64 year old female returns to [place] for further evaluation and treatment of pain involving the lower back lower extremity region and shoulders as well as knees. Patient has returned of significant pain involving the region of the knee. We have discussed patient's condition and will consider patient for injection of knee at time return appointment. Patient states that the pain interferes with activities of daily living including standing walking and also interferes with patient's ability to obtain restful sleep. We will consider injection of knee at return appointment as discussed. The patient is understanding and agrees with suggested treatment plan.     Review of Systems     Objective:   Physical Exam  There was tenderness of the splenius capitis and occipitalis regions palpation which reproduced pain of minimal degree with minimal tenderness of the trapezius levator scapula and rhomboid musculature regions. There was minimal tenderness of the acromioclavicular glenohumeral joint region. Patient was with unremarkable drop test. Tinel and Phalen's maneuver were without increased pain of any significant degree. Patient appeared to be with slightly decreased grip strength. Palpation over the thoracic facet thoracic paraspinal musculature region reproduced mild discomfort. Palpation over the lumbar paraspinal muscles lumbar facet region reproduced mild discomfort on the right as well as on the left. With the extension and palpation over the lumbar facets reproduce moderately severe discomfort. Palpation of the PSIS and PII S region reproduced mild to moderate discomfort. There was mild to moderate tends of the gluteal and piriformis musculature regions. Mild tenderness of the greater trochanteric region noted. Palpation of the knees reveal patient to be with crepitus of the  knees with without increased warmth and erythema of the knees. It was negative anterior and posterior drawer signs. No ballottement of the patella was noted. There was increased tenderness to palpation of the knees. There was negative and an anterior and posterior drawer signs. Range of motion of the knee call significant pain with crepitus pain noted of significant degree of both the left and right knees. Abdomen nontender and no costovertebral tenderness was noted.      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  Knee injections to be performed at time of return appointment as planned  F/U PCP for evaliation of  BP and general medical  condition.  F/U surgical evaluation.  F/U neurological evaluation.  May consider radiofrequency rhizolysis or intraspinal procedures pending response to present treatment and F/U evaluation.  Patient to call Pain Management Center should patient have concerns prior to scheduled return appointment.

## 2014-08-23 NOTE — Addendum Note (Signed)
Addended by: Burtis Junes on: 08/23/2014 07:39 AM   Modules accepted: Orders, Level of Service

## 2014-08-23 NOTE — Progress Notes (Signed)
   Subjective:    Patient ID: Katrina Simon, female    DOB: [birth year], 64 y.o.   MRN: [id number]  HPI    Review of Systems     Objective:   Physical Exam        Assessment & Plan:

## 2014-08-23 NOTE — Patient Instructions (Addendum)
Continue present medications.  Knee injection as planned and 2 weeks  F/U PCP for evaliation of  BP and general medical  condition.  F/U surgical evaluation.  F/U neurological evaluation.  May consider radiofrequency rhizolysis or intraspinal procedures pending response to present treatment and F/U evaluation.  Patient to call [facility] should patient have concerns prior to scheduled return appointment. GENERAL RISKS AND COMPLICATIONS  What are the risk, side effects and possible complications? Generally speaking, most procedures are safe.  However, with any procedure there are risks, side effects, and the possibility of complications.  The risks and complications are dependent upon the sites that are lesioned, or the type of nerve block to be performed.  The closer the procedure is to the spine, the more serious the risks are.  Great care is taken when placing the radio frequency needles, block needles or lesioning probes, but sometimes complications can occur. 1. Infection: Any time there is an injection through the skin, there is a risk of infection.  This is why sterile conditions are used for these blocks.  There are four possible types of infection. 1. Localized skin infection. 2. Central Nervous System Infection-This can be in the form of Meningitis, which can be deadly. 3. Epidural Infections-This can be in the form of an epidural abscess, which can cause pressure inside of the spine, causing compression of the spinal cord with subsequent paralysis. This would require an emergency surgery to decompress, and there are no guarantees that the patient would recover from the paralysis. 4. Discitis-This is an infection of the intervertebral discs.  It occurs in about 1% of discography procedures.  It is difficult to treat and it may lead to surgery.        2. Pain: the needles have to go through skin and soft tissues, will cause soreness.       3. Damage to internal structures:   The nerves to be lesioned may be near blood vessels or    other nerves which can be potentially damaged.       4. Bleeding: Bleeding is more common if the patient is taking blood thinners such as  aspirin, Coumadin, Ticiid, Plavix, etc., or if he/she have some genetic predisposition  such as hemophilia. Bleeding into the spinal canal can cause compression of the spinal  cord with subsequent paralysis.  This would require an emergency surgery to  decompress and there are no guarantees that the patient would recover from the  paralysis.       5. Pneumothorax:  Puncturing of a lung is a possibility, every time a needle is introduced in  the area of the chest or upper back.  Pneumothorax refers to free air around the  collapsed lung(s), inside of the thoracic cavity (chest cavity).  Another two possible  complications related to a similar event would include: Hemothorax and Chylothorax.   These are variations of the Pneumothorax, where instead of air around the collapsed  lung(s), you may have blood or chyle, respectively.       6. Spinal headaches: They may occur with any procedures in the area of the spine.       7. Persistent CSF (Cerebro-Spinal Fluid) leakage: This is a rare problem, but may occur  with prolonged intrathecal or epidural catheters either due to the formation of a fistulous  track or a dural tear.       8. Nerve damage: By working so close to the spinal cord, there is always a  possibility of  nerve damage, which could be as serious as a permanent spinal cord injury with  paralysis.       9. Death:  Although rare, severe deadly allergic reactions known as "Anaphylactic  reaction" can occur to any of the medications used.      10. Worsening of the symptoms:  We can always make thing worse.  What are the chances of something like this happening? Chances of any of this occuring are extremely low.  By statistics, you have more of a chance of getting killed in a motor vehicle accident: while  driving to the hospital than any of the above occurring .  Nevertheless, you should be aware that they are possibilities.  In general, it is similar to taking a shower.  Everybody knows that you can slip, hit your head and get killed.  Does that mean that you should not shower again?  Nevertheless always keep in mind that statistics do not mean anything if you happen to be on the wrong side of them.  Even if a procedure has a 1 (one) in a 1,000,000 (million) chance of going wrong, it you happen to be that one..Also, keep in mind that by statistics, you have more of a chance of having something go wrong when taking medications.  Who should not have this procedure? If you are on a blood thinning medication (e.g. Coumadin, Plavix, see list of "Blood Thinners"), or if you have an active infection going on, you should not have the procedure.  If you are taking any blood thinners, please inform your physician.  How should I prepare for this procedure?  Do not eat or drink anything at least six hours prior to the procedure.  Bring a driver with you .  It cannot be a taxi.  Come accompanied by an adult that can drive you back, and that is strong enough to help you if your legs get weak or numb from the local anesthetic.  Take all of your medicines the morning of the procedure with just enough water to swallow them.  If you have diabetes, make sure that you are scheduled to have your procedure done first thing in the morning, whenever possible.  If you have diabetes, take only half of your insulin dose and notify our nurse that you have done so as soon as you arrive at the clinic.  If you are diabetic, but only take blood sugar pills (oral hypoglycemic), then do not take them on the morning of your procedure.  You may take them after you have had the procedure.  Do not take aspirin or any aspirin-containing medications, at least eleven (11) days prior to the procedure.  They may prolong bleeding.  Wear  loose fitting clothing that may be easy to take off and that you would not mind if it got stained with Betadine or blood.  Do not wear any jewelry or perfume  Remove any nail coloring.  It will interfere with some of our monitoring equipment.  NOTE: Remember that this is not meant to be interpreted as a complete list of all possible complications.  Unforeseen problems may occur.  BLOOD THINNERS The following drugs contain aspirin or other products, which can cause increased bleeding during surgery and should not be taken for 2 weeks prior to and 1 week after surgery.  If you should need take something for relief of minor pain, you may take acetaminophen which is found in Tylenol,m Datril, Anacin-3 and Panadol. It  is not blood thinner. The products listed below are.  Do not take any of the products listed below in addition to any listed on your instruction sheet.  A.P.C or A.P.C with Codeine Codeine Phosphate Capsules #3 Ibuprofen Ridaura  ABC compound Congesprin Imuran rimadil  Advil Cope Indocin Robaxisal  Alka-Seltzer Effervescent Pain Reliever and Antacid Coricidin or Coricidin-D  Indomethacin Rufen  Alka-Seltzer plus Cold Medicine Cosprin Ketoprofen S-A-C Tablets  Anacin Analgesic Tablets or Capsules Coumadin Korlgesic Salflex  Anacin Extra Strength Analgesic tablets or capsules CP-2 Tablets Lanoril Salicylate  Anaprox Cuprimine Capsules Levenox Salocol  Anexsia-D Dalteparin Magan Salsalate  Anodynos Darvon compound Magnesium Salicylate Sine-off  Ansaid Dasin Capsules Magsal Sodium Salicylate  Anturane Depen Capsules Marnal Soma  APF Arthritis pain formula Dewitt's Pills Measurin Stanback  Argesic Dia-Gesic Meclofenamic Sulfinpyrazone  Arthritis Bayer Timed Release Aspirin Diclofenac Meclomen Sulindac  Arthritis pain formula Anacin Dicumarol Medipren Supac  Analgesic (Safety coated) Arthralgen Diffunasal Mefanamic Suprofen  Arthritis Strength Bufferin Dihydrocodeine Mepro Compound  Suprol  Arthropan liquid Dopirydamole Methcarbomol with Aspirin Synalgos  ASA tablets/Enseals Disalcid Micrainin Tagament  Ascriptin Doan's Midol Talwin  Ascriptin A/D Dolene Mobidin Tanderil  Ascriptin Extra Strength Dolobid Moblgesic Ticlid  Ascriptin with Codeine Doloprin or Doloprin with Codeine Momentum Tolectin  Asperbuf Duoprin Mono-gesic Trendar  Aspergum Duradyne Motrin or Motrin IB Triminicin  Aspirin plain, buffered or enteric coated Durasal Myochrisine Trigesic  Aspirin Suppositories Easprin Nalfon Trillsate  Aspirin with Codeine Ecotrin Regular or Extra Strength Naprosyn Uracel  Atromid-S Efficin Naproxen Ursinus  Auranofin Capsules Elmiron Neocylate Vanquish  Axotal Emagrin Norgesic Verin  Azathioprine Empirin or Empirin with Codeine Normiflo Vitamin E  Azolid Emprazil Nuprin Voltaren  Bayer Aspirin plain, buffered or children's or timed BC Tablets or powders Encaprin Orgaran Warfarin Sodium  Buff-a-Comp Enoxaparin Orudis Zorpin  Buff-a-Comp with Codeine Equegesic Os-Cal-Gesic   Buffaprin Excedrin plain, buffered or Extra Strength Oxalid   Bufferin Arthritis Strength Feldene Oxphenbutazone   Bufferin plain or Extra Strength Feldene Capsules Oxycodone with Aspirin   Bufferin with Codeine Fenoprofen Fenoprofen Pabalate or Pabalate-SF   Buffets II Flogesic Panagesic   Buffinol plain or Extra Strength Florinal or Florinal with Codeine Panwarfarin   Buf-Tabs Flurbiprofen Penicillamine   Butalbital Compound Four-way cold tablets Penicillin   Butazolidin Fragmin Pepto-Bismol   Carbenicillin Geminisyn Percodan   Carna Arthritis Reliever Geopen Persantine   Carprofen Gold's salt Persistin   Chloramphenicol Goody's Phenylbutazone   Chloromycetin Haltrain Piroxlcam   Clmetidine heparin Plaquenil   Cllnoril Hyco-pap Ponstel   Clofibrate Hydroxy chloroquine Propoxyphen         Before stopping any of these medications, be sure to consult the physician who ordered them.  Some,  such as Coumadin (Warfarin) are ordered to prevent or treat serious conditions such as "deep thrombosis", "pumonary embolisms", and other heart problems.  The amount of time that you may need off of the medication may also vary with the medication and the reason for which you were taking it.  If you are taking any of these medications, please make sure you notify your pain physician before you undergo any procedures.         Knee Injection Joint injections are shots. Your caregiver will place a needle into your knee joint. The needle is used to put medicine into the joint. These shots can be used to help treat different painful knee conditions such as osteoarthritis, bursitis, local flare-ups of rheumatoid arthritis, and pseudogout. Anti-inflammatory medicines such as corticosteroids and anesthetics are the most  common medicines used for joint and soft tissue injections.  PROCEDURE 2. The skin over the kneecap will be cleaned with an antiseptic solution. 3. Your caregiver will inject a small amount of a local anesthetic (a medicine like Novocaine) just under the skin in the area that was cleaned. 4. After the area becomes numb, a second injection is done. This second injection usually includes an anesthetic and an anti-inflammatory medicine called a steroid or cortisone. The needle is carefully placed in between the kneecap and the knee, and the medicine is injected into the joint space. 5. After the injection is done, the needle is removed. Your caregiver may place a bandage over the injection site. The whole procedure takes no more than a couple of minutes. BEFORE THE PROCEDURE  Wash all of the skin around the entire knee area. Try to remove any loose, scaling skin. There is no other specific preparation necessary unless advised otherwise by your caregiver. LET YOUR CAREGIVER KNOW ABOUT:   Allergies.  Medications taken including herbs, eye drops, over the counter medications, and  creams.  Use of steroids (by mouth or creams).  Possible pregnancy, if applicable.  Previous problems with anesthetics or Novocaine.  History of blood clots (thrombophlebitis).  History of bleeding or blood problems.  Previous surgery.  Other health problems. RISKS AND COMPLICATIONS Side effects from cortisone shots are rare. They include:   Slight bruising of the skin.  Shrinkage of the normal fatty tissue under the skin where the shot was given.  Increase in pain after the shot.  Infection.  Weakening of tendons or tendon rupture.  Allergic reaction to the medicine.  Diabetics may have a temporary increase in their blood sugar after a shot.  Cortisone can temporarily weaken the immune system. While receiving these shots, you should not get certain vaccines. Also, avoid contact with anyone who has chickenpox or measles. Especially if you have never had these diseases or have not been previously immunized. Your immune system may not be strong enough to fight off the infection while the cortisone is in your system. AFTER THE PROCEDURE   You can go home after the procedure.  You may need to put ice on the joint 15-20 minutes every 3 or 4 hours until the pain goes away.  You may need to put an elastic bandage on the joint. HOME CARE INSTRUCTIONS  12. Only take over-the-counter or prescription medicines for pain, discomfort, or fever as directed by your caregiver. 13. You should avoid stressing the joint. Unless advised otherwise, avoid activities that put a lot of pressure on a knee joint, such as: 1. Jogging. 2. Bicycling. 3. Recreational climbing. 4. Hiking. 14. Laying down and elevating the leg/knee above the level of your heart can help to minimize swelling. SEEK MEDICAL CARE IF:   You have repeated or worsening swelling.  There is drainage from the puncture area.  You develop red streaking that extends above or below the site where the needle was  inserted. SEEK IMMEDIATE MEDICAL CARE IF:   You develop a fever.  You have pain that gets worse even though you are taking pain medicine.  The area is red and warm, and you have trouble moving the joint. MAKE SURE YOU:   Understand these instructions.  Will watch your condition.  Will get help right away if you are not doing well or get worse. Document Released: 05/25/2006 Document Revised: 05/26/2011 Document Reviewed: 02/19/2007 [facility] Patient Information 2015 [place], [place]. This information is not intended  to replace advice given to you by your health care provider. Make sure you discuss any questions you have with your health care provider.  

## 2014-09-06 ENCOUNTER — Ambulatory Visit: Payer: BC Managed Care – PPO | Admitting: Pain Medicine

## 2014-09-21 ENCOUNTER — Ambulatory Visit: Payer: BC Managed Care – PPO | Attending: Pain Medicine | Admitting: Pain Medicine

## 2014-09-21 ENCOUNTER — Encounter: Payer: Self-pay | Admitting: Pain Medicine

## 2014-09-21 VITALS — BP 135/63 | HR 76 | Temp 97.9°F | Resp 16 | Ht 65.0 in | Wt 158.0 lb

## 2014-09-21 DIAGNOSIS — M47816 Spondylosis without myelopathy or radiculopathy, lumbar region: Secondary | ICD-10-CM | POA: Diagnosis not present

## 2014-09-21 DIAGNOSIS — M19012 Primary osteoarthritis, left shoulder: Secondary | ICD-10-CM | POA: Diagnosis not present

## 2014-09-21 DIAGNOSIS — M533 Sacrococcygeal disorders, not elsewhere classified: Secondary | ICD-10-CM | POA: Insufficient documentation

## 2014-09-21 DIAGNOSIS — M159 Polyosteoarthritis, unspecified: Secondary | ICD-10-CM

## 2014-09-21 DIAGNOSIS — M79604 Pain in right leg: Secondary | ICD-10-CM | POA: Diagnosis present

## 2014-09-21 DIAGNOSIS — M19011 Primary osteoarthritis, right shoulder: Secondary | ICD-10-CM

## 2014-09-21 DIAGNOSIS — M5136 Other intervertebral disc degeneration, lumbar region: Secondary | ICD-10-CM | POA: Diagnosis not present

## 2014-09-21 DIAGNOSIS — M79605 Pain in left leg: Secondary | ICD-10-CM | POA: Diagnosis present

## 2014-09-21 DIAGNOSIS — M545 Low back pain: Secondary | ICD-10-CM | POA: Diagnosis present

## 2014-09-21 DIAGNOSIS — M5135 Other intervertebral disc degeneration, thoracolumbar region: Secondary | ICD-10-CM

## 2014-09-21 DIAGNOSIS — M150 Primary generalized (osteo)arthritis: Secondary | ICD-10-CM

## 2014-09-21 DIAGNOSIS — M170 Bilateral primary osteoarthritis of knee: Secondary | ICD-10-CM | POA: Diagnosis not present

## 2014-09-21 MED ORDER — OXYCODONE HCL 5 MG PO TABS: ORAL_TABLET | ORAL | Status: DC

## 2014-09-21 MED ORDER — OXYCODONE HCL ER 10 MG PO T12A: EXTENDED_RELEASE_TABLET | ORAL | Status: DC

## 2014-09-21 NOTE — Progress Notes (Signed)
Discharge patient home ambulatory at 0759hrs Teach back done Scripts given for oxycontin  And oxycodone

## 2014-09-21 NOTE — Patient Instructions (Addendum)
Continue present medication OxyContin and oxycodone  F/U PCP for evaliation of  BP and general medical  condition.  F/U surgical evaluation  F/U neurological evaluation  May consider radiofrequency rhizolysis or intraspinal procedures pending response to present treatment and F/U evaluation.  Patient to call [facility] should patient have concerns prior to scheduled return appointment.  Your were given scrips for Oxycodone and Oxycontin today

## 2014-09-21 NOTE — Progress Notes (Signed)
Safety precautions to be maintained throughout the outpatient stay will include: orient to surroundings, keep bed in low position, maintain call bell within reach at all times, provide assistance with transfer out of bed and ambulation.  

## 2014-09-21 NOTE — Progress Notes (Signed)
   Subjective:    Patient ID: Katrina Simon, female    DOB: July 14, 1950, 64 y.o.   MRN: [id number]  HPI  Patient is 64 year old female returns to [facility] for further evaluation and treatment of pain involving the lower back and lower extremity regions especially. Patient states that the pain is fairly well-controlled except for pain involving the knees. Patient will undergo follow-up orthopedic evaluation and is being considered for Synvisc injection of the right knee. We will observe patient's response to present treatment and will continue medications OxyContin and oxycodone. The patient was understanding and in agreement with suggested treatment plan.    Review of Systems     Objective:   Physical Exam There was mild tenderness of the splenius capitis and occipitalis musculature region. Palpation of the trapezius levator rhomboid musculature regions was without severe tenderness to palpation no crepitus of the thoracic region was noted. Patient appeared to be with slightly decreased grip strength with Tinel and Phalen for been without increased pain of significant degree. Palpation over the lumbar paraspinal musculature region lumbar facet region was with moderate tends to palpation. Lateral bending and rotation was with increased pain of moderate degree. There was tenderness of the PSIS and PII S regions of moderate degree. Straight leg raising tolerates approximately 30 without increased pain with dorsiflexion. There was negative clonus negative Homans. Knee was a tenderness to palpation without ballottement of the patella. Crepitus of the knee was noted and there was pain with range of motion maneuvers of the knee. No ballottement of the patella was noted. There was negative clonus negative Homans. 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  F/U surgical evaluation  F/U neurological evaluation  Orthopedic evaluation with Synvisc injection of knee as planned  May consider radiofrequency rhizolysis or intraspinal procedures pending response to present treatment and F/U evaluation.  Patient to call [facility] Management Center should patient have concerns prior to scheduled return appointment.

## 2014-10-19 ENCOUNTER — Ambulatory Visit: Payer: BC Managed Care – PPO | Attending: Pain Medicine | Admitting: Pain Medicine

## 2014-10-19 VITALS — BP 138/68 | HR 67 | Temp 98.2°F | Resp 16 | Ht 65.0 in | Wt 155.0 lb

## 2014-10-19 DIAGNOSIS — M170 Bilateral primary osteoarthritis of knee: Secondary | ICD-10-CM | POA: Diagnosis not present

## 2014-10-19 DIAGNOSIS — M533 Sacrococcygeal disorders, not elsewhere classified: Secondary | ICD-10-CM | POA: Insufficient documentation

## 2014-10-19 DIAGNOSIS — M47816 Spondylosis without myelopathy or radiculopathy, lumbar region: Secondary | ICD-10-CM | POA: Insufficient documentation

## 2014-10-19 DIAGNOSIS — M19012 Primary osteoarthritis, left shoulder: Secondary | ICD-10-CM | POA: Insufficient documentation

## 2014-10-19 DIAGNOSIS — M79604 Pain in right leg: Secondary | ICD-10-CM | POA: Diagnosis present

## 2014-10-19 DIAGNOSIS — M79605 Pain in left leg: Secondary | ICD-10-CM | POA: Diagnosis present

## 2014-10-19 DIAGNOSIS — M19011 Primary osteoarthritis, right shoulder: Secondary | ICD-10-CM | POA: Insufficient documentation

## 2014-10-19 DIAGNOSIS — M5136 Other intervertebral disc degeneration, lumbar region: Secondary | ICD-10-CM | POA: Diagnosis not present

## 2014-10-19 DIAGNOSIS — M5135 Other intervertebral disc degeneration, thoracolumbar region: Secondary | ICD-10-CM

## 2014-10-19 DIAGNOSIS — M160 Bilateral primary osteoarthritis of hip: Secondary | ICD-10-CM

## 2014-10-19 DIAGNOSIS — M545 Low back pain: Secondary | ICD-10-CM | POA: Diagnosis present

## 2014-10-19 MED ORDER — OXYCODONE HCL 5 MG PO TABS: ORAL_TABLET | ORAL | Status: DC

## 2014-10-19 MED ORDER — OXYCODONE HCL ER 10 MG PO T12A: EXTENDED_RELEASE_TABLET | ORAL | Status: DC

## 2014-10-19 NOTE — Patient Instructions (Signed)
Continue present medication oxycodone and OxyContin  F/U PCP Dr. Caryn Section for evaliation of  BP and general medical  condition  F/U surgical evaluation  F/U neurological evaluation  May consider radiofrequency rhizolysis or intraspinal procedures pending response to present treatment and F/U evaluation   Patient to call [facility] Center should patient have concerns prior to scheduled return appointmen.

## 2014-10-19 NOTE — Progress Notes (Signed)
   Subjective:    Patient ID: Katrina Simon, female    DOB: 18-Jun-1950, 64 y.o.   MRN: [id number]  HPI  Patient is 64 year old female returns to [facility] for further evaluation and treatment of pain involving the region of the lower back lower extremity region with pain in the shoulder of lesser degree. Present time patient states that pain is fairly well-controlled. Patient denies any significant pain of the lower back lower extremity region with standing walking twisting turning maneuvers. We discussed patient's condition and will continue present medication  at this time and may consider modification of treatment should there be significant change in condition. The patient was understanding and in agreement with suggested treatment plan.   Review of Systems     Objective:   Physical Exam    there was mild tenderness of the splenius capitis and occipitalis musculature region. Patient appeared to be with unremarkable Spurling's maneuver. Patient able to perform drop test without difficulty. And appeared to be with bilaterally equal grip strength. Tinel and Phalen's maneuver without increased pain of significant degree. There was tenderness to palpation over the thoracic facet thoracic paraspinal muscles region a minimal degree. Palpation over the lumbar paraspinal muscles lumbar facet region was with minimal tenderness to palpation with lateral bending and rotation and extension to palpation of the lumbar facets reproducing minimal discomfort. Minimal tenderness of the greater trochanteric region iliotibial band region. There was straight leg raising tolerated to approximately 30 without increased pain with dorsiflexion noted with negative clonus negative Homans. There was question decreased EHL strength without sensory deficit of dermatomal distribution detected there was negative clonus negative Homans. Abdomen nontender and no costovertebral angle tenderness was noted.       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 Caryn Section for evaliation of  BP and general medical  condition  F/U surgical evaluation  F/U neurological evaluation  May consider radiofrequency rhizolysis or intraspinal procedures pending response to present treatment and F/U evaluation   Patient to call [facility] Center should patient have concerns prior to scheduled return appointmen.

## 2014-10-19 NOTE — Progress Notes (Signed)
Safety precautions to be maintained throughout the outpatient stay will include: orient to surroundings, keep bed in low position, maintain call bell within reach at all times, provide assistance with transfer out of bed and ambulation.  

## 2014-10-24 ENCOUNTER — Encounter: Payer: BC Managed Care – PPO | Admitting: Pain Medicine

## 2014-10-30 ENCOUNTER — Other Ambulatory Visit: Payer: Self-pay | Admitting: Pain Medicine

## 2014-11-21 ENCOUNTER — Ambulatory Visit: Payer: BC Managed Care – PPO | Attending: Pain Medicine | Admitting: Pain Medicine

## 2014-11-21 ENCOUNTER — Encounter: Payer: Self-pay | Admitting: Pain Medicine

## 2014-11-21 VITALS — BP 133/62 | HR 76 | Temp 96.0°F | Resp 18 | Ht 65.0 in | Wt 157.0 lb

## 2014-11-21 DIAGNOSIS — M19011 Primary osteoarthritis, right shoulder: Secondary | ICD-10-CM | POA: Insufficient documentation

## 2014-11-21 DIAGNOSIS — M5135 Other intervertebral disc degeneration, thoracolumbar region: Secondary | ICD-10-CM

## 2014-11-21 DIAGNOSIS — M79605 Pain in left leg: Secondary | ICD-10-CM | POA: Diagnosis present

## 2014-11-21 DIAGNOSIS — M5126 Other intervertebral disc displacement, lumbar region: Secondary | ICD-10-CM | POA: Insufficient documentation

## 2014-11-21 DIAGNOSIS — M19012 Primary osteoarthritis, left shoulder: Secondary | ICD-10-CM | POA: Insufficient documentation

## 2014-11-21 DIAGNOSIS — M545 Low back pain: Secondary | ICD-10-CM | POA: Diagnosis present

## 2014-11-21 DIAGNOSIS — M533 Sacrococcygeal disorders, not elsewhere classified: Secondary | ICD-10-CM | POA: Diagnosis not present

## 2014-11-21 DIAGNOSIS — M170 Bilateral primary osteoarthritis of knee: Secondary | ICD-10-CM | POA: Insufficient documentation

## 2014-11-21 DIAGNOSIS — M47816 Spondylosis without myelopathy or radiculopathy, lumbar region: Secondary | ICD-10-CM | POA: Diagnosis not present

## 2014-11-21 DIAGNOSIS — M5136 Other intervertebral disc degeneration, lumbar region: Secondary | ICD-10-CM | POA: Diagnosis not present

## 2014-11-21 DIAGNOSIS — M160 Bilateral primary osteoarthritis of hip: Secondary | ICD-10-CM

## 2014-11-21 DIAGNOSIS — M79604 Pain in right leg: Secondary | ICD-10-CM | POA: Diagnosis present

## 2014-11-21 MED ORDER — OXYCODONE HCL ER 10 MG PO T12A: EXTENDED_RELEASE_TABLET | ORAL | Status: DC

## 2014-11-21 MED ORDER — OXYCODONE HCL 5 MG PO TABS: ORAL_TABLET | ORAL | Status: DC

## 2014-11-21 NOTE — Patient Instructions (Signed)
PLAN   Continue present medications oxycodone and OxyContin  F/U PCP Dr. Caryn Section for evaliation of  BP and general medical  condition  F/U surgical evaluation. May consider pending follow-up evaluations  F/U neurological evaluation. May consider pending follow-up evaluations  May consider radiofrequency rhizolysis or intraspinal procedures pending response to present treatment and F/U evaluation   Patient to call [facility] Management Center should patient have concerns prior to scheduled return appointment.

## 2014-11-21 NOTE — Progress Notes (Signed)
Safety precautions to be maintained throughout the outpatient stay will include: orient to surroundings, keep bed in low position, maintain call bell within reach at all times, provide assistance with transfer out of bed and ambulation.  

## 2014-11-21 NOTE — Progress Notes (Signed)
   Subjective:    Patient ID: Katrina Simon, female    DOB: May 10, 1950, 64 y.o.   MRN: [id number]  HPI  Patient is 64 year old female returns to [place] for pain involving the lower back and lower extremity region. Patient is pain involving shoulder as well. Pain is fairly well-controlled. We will continue oxycodone and OxyContin as prescribed and patient is to call [facility] should any change in condition prior to scheduled return appointment. The patient is understanding and agrees with suggested treatment plan.    Review of Systems     Objective:   Physical Exam  There was mild tenderness of the splenius capitis and occipitalis musculature region. Palpation of the cervical facet cervical paraspinal musculature region reproduced mild discomfort. There was mild tenderness over the thoracic facet thoracic paraspinal musculature region. There was mild tenderness of the acromioclavicular and glenohumeral. There was unremarkable without tenderness. Patient appeared to be with bilaterally equal grip. Tinel and Phalen's maneuver without increased pain of significant degree. Palpation over the lumbar paraspinals region lumbar facet palpation of mild degree. Lateral bending and rotation and extension and palpation of the lumbar facets reproduce mild to moderate discomfort. There was tenderness of the gluteal and piriformis musculature region of mild degree. There was mild tenderness of the PSIS and PII S regions. Straight leg raising was tolerates approximately 30 without increased pain with dorsiflexion noted. There was negative clonus negative Homans. DTRs difficult to elicit difficulty relaxing. Abdomen nontender and no costovertebral angle tenderness noted.    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 Caryn Section for evaliation of  BP and general medical  condition  F/U surgical evaluation. May consider pending follow-up evaluations  F/U neurological evaluation. May consider pending follow-up evaluations  May consider radiofrequency rhizolysis or intraspinal procedures pending response to present treatment and F/U evaluation   Patient to call [facility] should patient have concerns prior to scheduled return appointment.

## 2014-12-06 DIAGNOSIS — Z87442 Personal history of urinary calculi: Secondary | ICD-10-CM | POA: Insufficient documentation

## 2014-12-06 DIAGNOSIS — K219 Gastro-esophageal reflux disease without esophagitis: Secondary | ICD-10-CM | POA: Insufficient documentation

## 2014-12-06 DIAGNOSIS — G894 Chronic pain syndrome: Secondary | ICD-10-CM | POA: Insufficient documentation

## 2014-12-19 ENCOUNTER — Encounter: Payer: Self-pay | Admitting: Pain Medicine

## 2014-12-19 ENCOUNTER — Ambulatory Visit: Payer: BC Managed Care – PPO | Attending: Pain Medicine | Admitting: Pain Medicine

## 2014-12-19 VITALS — BP 122/59 | HR 76 | Temp 98.4°F | Resp 15 | Ht 65.0 in | Wt 154.0 lb

## 2014-12-19 DIAGNOSIS — M19011 Primary osteoarthritis, right shoulder: Secondary | ICD-10-CM

## 2014-12-19 DIAGNOSIS — M533 Sacrococcygeal disorders, not elsewhere classified: Secondary | ICD-10-CM | POA: Insufficient documentation

## 2014-12-19 DIAGNOSIS — M19012 Primary osteoarthritis, left shoulder: Secondary | ICD-10-CM | POA: Insufficient documentation

## 2014-12-19 DIAGNOSIS — M5135 Other intervertebral disc degeneration, thoracolumbar region: Secondary | ICD-10-CM

## 2014-12-19 DIAGNOSIS — M5136 Other intervertebral disc degeneration, lumbar region: Secondary | ICD-10-CM | POA: Diagnosis not present

## 2014-12-19 DIAGNOSIS — M47816 Spondylosis without myelopathy or radiculopathy, lumbar region: Secondary | ICD-10-CM | POA: Diagnosis not present

## 2014-12-19 DIAGNOSIS — M79605 Pain in left leg: Secondary | ICD-10-CM | POA: Diagnosis present

## 2014-12-19 DIAGNOSIS — M79604 Pain in right leg: Secondary | ICD-10-CM | POA: Diagnosis present

## 2014-12-19 DIAGNOSIS — M150 Primary generalized (osteo)arthritis: Secondary | ICD-10-CM

## 2014-12-19 DIAGNOSIS — M170 Bilateral primary osteoarthritis of knee: Secondary | ICD-10-CM

## 2014-12-19 DIAGNOSIS — M159 Polyosteoarthritis, unspecified: Secondary | ICD-10-CM

## 2014-12-19 DIAGNOSIS — M51369 Other intervertebral disc degeneration, lumbar region without mention of lumbar back pain or lower extremity pain: Secondary | ICD-10-CM

## 2014-12-19 DIAGNOSIS — M160 Bilateral primary osteoarthritis of hip: Secondary | ICD-10-CM

## 2014-12-19 DIAGNOSIS — M545 Low back pain: Secondary | ICD-10-CM | POA: Diagnosis present

## 2014-12-19 MED ORDER — OXYCODONE HCL 5 MG PO TABS: ORAL_TABLET | ORAL | Status: DC

## 2014-12-19 MED ORDER — OXYCODONE HCL ER 10 MG PO T12A: EXTENDED_RELEASE_TABLET | ORAL | Status: DC

## 2014-12-19 NOTE — Progress Notes (Signed)
Safety precautions to be maintained throughout the outpatient stay will include: orient to surroundings, keep bed in low position, maintain call bell within reach at all times, provide assistance with transfer out of bed and ambulation.  

## 2014-12-19 NOTE — Patient Instructions (Signed)
PLAN   Continue present medication OxyContin and oxycodone  F/U PCP Dr. Caryn Section for evaliation of  BP and general medical  condition  F/U surgical evaluation. May consider pending follow-up evaluations  F/U neurological evaluation. May consider pending follow-up evaluations  May consider radiofrequency rhizolysis or intraspinal procedures pending response to present treatment and F/U evaluation   Patient to call [facility] Center should patient have concerns prior to scheduled return appointment.

## 2014-12-19 NOTE — Progress Notes (Signed)
   Subjective:    Patient ID: Katrina Simon, female    DOB: February 03, 1951, 64 y.o.   MRN: [id number]  HPI Patient 64 year old female returns to [place] for follow-up evaluation and treatment of pain involving the lower back and lower extremity regions. The patient states that the pain occurs across the lower back region and radiates to the lower extremities. Patient states that pain of the shoulders is fairly well-controlled at this time. Patient states that the pain increases as the day progresses The patient denies any trauma change in events of daily living the cause change in symptomatology patient will continue oxycodone and OxyContin as prescribed at this time and will call [facility] Center should there be change in condition prior to scheduled return appointment the patient was in agreement with suggested treatment plan   Review of Systems     Objective:   Physical Exam  There was tenderness to palpation of paraspinal muscle region cervical region cervical facet region of mild degree. There was mild tenderness to palpation over these cervical facet cervical paraspinal musculature region as well as the splenius capitis and occipitalis musculature regions. There appeared to be unremarkable Spurling's maneuver. There was minimal tenderness of the acromioclavicular and glenohumeral joint regions. Patient appeared to be with bilaterally equal grip strength. Tinel and Phalen's maneuver were without increase of pain of significant degree. There was tenderness over the lumbar paraspinal muscles lumbar facet region of mild degree. Lateral bending and rotation and extension and palpation of the lumbar facets reproduce mild discomfort. Straight leg raising was tolerated to 30 without increased pain with dorsiflexion noted. There was negative clonus negative Homans. There was mild tinnitus of the PSIS and PII S regions. EHL strength appeared to be slightly decreased with no sensory deficit  of dermatomal distribution detected. Abdomen was nontender with no costovertebral angle tenderness noted.      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 May consider pending follow-up evaluations  F/U neurological evaluation. May consider pending follow-up evaluations  May consider radiofrequency rhizolysis or intraspinal procedures pending response to present treatment and F/U evaluation   Patient to call [facility] Management Center should patient have concerns prior to scheduled return appointment.

## 2015-01-18 ENCOUNTER — Ambulatory Visit: Payer: BC Managed Care – PPO | Attending: Pain Medicine | Admitting: Pain Medicine

## 2015-01-18 ENCOUNTER — Encounter: Payer: Self-pay | Admitting: Pain Medicine

## 2015-01-18 VITALS — BP 132/67 | HR 66 | Temp 97.9°F | Resp 16 | Ht 65.0 in | Wt 154.0 lb

## 2015-01-18 DIAGNOSIS — M159 Polyosteoarthritis, unspecified: Secondary | ICD-10-CM

## 2015-01-18 DIAGNOSIS — M47816 Spondylosis without myelopathy or radiculopathy, lumbar region: Secondary | ICD-10-CM | POA: Diagnosis not present

## 2015-01-18 DIAGNOSIS — M79602 Pain in left arm: Secondary | ICD-10-CM | POA: Diagnosis present

## 2015-01-18 DIAGNOSIS — M51369 Other intervertebral disc degeneration, lumbar region without mention of lumbar back pain or lower extremity pain: Secondary | ICD-10-CM

## 2015-01-18 DIAGNOSIS — M5136 Other intervertebral disc degeneration, lumbar region: Secondary | ICD-10-CM | POA: Insufficient documentation

## 2015-01-18 DIAGNOSIS — M533 Sacrococcygeal disorders, not elsewhere classified: Secondary | ICD-10-CM | POA: Insufficient documentation

## 2015-01-18 DIAGNOSIS — M542 Cervicalgia: Secondary | ICD-10-CM | POA: Diagnosis present

## 2015-01-18 DIAGNOSIS — M19012 Primary osteoarthritis, left shoulder: Secondary | ICD-10-CM | POA: Diagnosis not present

## 2015-01-18 DIAGNOSIS — M19011 Primary osteoarthritis, right shoulder: Secondary | ICD-10-CM | POA: Diagnosis not present

## 2015-01-18 DIAGNOSIS — M79601 Pain in right arm: Secondary | ICD-10-CM | POA: Diagnosis present

## 2015-01-18 DIAGNOSIS — M170 Bilateral primary osteoarthritis of knee: Secondary | ICD-10-CM | POA: Diagnosis not present

## 2015-01-18 DIAGNOSIS — M160 Bilateral primary osteoarthritis of hip: Secondary | ICD-10-CM

## 2015-01-18 DIAGNOSIS — M5135 Other intervertebral disc degeneration, thoracolumbar region: Secondary | ICD-10-CM

## 2015-01-18 DIAGNOSIS — M150 Primary generalized (osteo)arthritis: Secondary | ICD-10-CM

## 2015-01-18 MED ORDER — ORPHENADRINE CITRATE 30 MG/ML IJ SOLN: 60.0000 mg | Freq: Once | INTRAMUSCULAR | Status: DC

## 2015-01-18 MED ORDER — SODIUM CHLORIDE 0.9 % IJ SOLN: 20.0000 mL | Freq: Once | INTRAMUSCULAR | Status: DC

## 2015-01-18 MED ORDER — TRIAMCINOLONE ACETONIDE 40 MG/ML IJ SUSP: 40.0000 mg | Freq: Once | INTRAMUSCULAR | Status: DC

## 2015-01-18 MED ORDER — LACTATED RINGERS IV SOLN: 1000.0000 mL | INTRAVENOUS | Status: DC

## 2015-01-18 MED ORDER — OXYCODONE HCL ER 10 MG PO T12A: EXTENDED_RELEASE_TABLET | ORAL | Status: DC

## 2015-01-18 MED ORDER — FENTANYL CITRATE (PF) 100 MCG/2ML IJ SOLN: 100.0000 ug | Freq: Once | INTRAMUSCULAR | Status: DC

## 2015-01-18 MED ORDER — OXYCODONE HCL 5 MG PO TABS: ORAL_TABLET | ORAL | Status: DC

## 2015-01-18 MED ORDER — CEFAZOLIN SODIUM 1-5 GM-% IV SOLN: 1.0000 g | Freq: Once | INTRAVENOUS | Status: DC

## 2015-01-18 MED ORDER — LIDOCAINE HCL (PF) 1 % IJ SOLN: 10.0000 mL | Freq: Once | INTRAMUSCULAR | Status: DC

## 2015-01-18 MED ORDER — MIDAZOLAM HCL 2 MG/2ML IJ SOLN: 5.0000 mg | Freq: Once | INTRAMUSCULAR | Status: DC

## 2015-01-18 MED ORDER — MIDAZOLAM HCL 5 MG/5ML IJ SOLN: 5.0000 mg | Freq: Once | INTRAMUSCULAR | Status: DC

## 2015-01-18 MED ORDER — BUPIVACAINE HCL (PF) 0.25 % IJ SOLN: 30.0000 mL | Freq: Once | INTRAMUSCULAR | Status: DC

## 2015-01-18 NOTE — Patient Instructions (Signed)
PLAN   Continue present medication OxyContin and oxycodone  F/U PCP Dr. Caryn Section for evaliation of  BP and general medical  condition  F/U surgical evaluation. May consider pending follow-up evaluations  F/U neurological evaluation. May consider pending follow-up evaluations  May consider radiofrequency rhizolysis or intraspinal procedures pending response to present treatment and F/U evaluation   Patient to call [facility] Center should patient have concerns prior to scheduled return appointment.

## 2015-01-18 NOTE — Progress Notes (Signed)
Safety precautions to be maintained throughout the outpatient stay will include: orient to surroundings, keep bed in low position, maintain call bell within reach at all times, provide assistance with transfer out of bed and ambulation.  

## 2015-01-18 NOTE — Progress Notes (Signed)
   Subjective:    Patient ID: Katrina Simon, female    DOB: February 15, 1951, 64 y.o.   MRN: [id number]  HPI Patient is a 64 year old female who returns to pain management center for further evaluation and treatment of pain involving the region of the neck upper extremities and shoulder lower back and lower extremity regions.. Patient recently was able to go horseback riding since her pain was so well controlled. Continue present medications and avoid interventional treatment. The patient was in agreement with suggested treatment plan      Review of Systems     Objective:   Physical Exam  There was tenderness to palpation of the paraspinal musculature and cervical facet region as well as the thoracic spinal musculature and thoracic facet region there was tenderness of the splenius capitis and occipitalis regions as well. There was no crepitus of the thoracic region noted. Palpation over the lower thoracic region was evidence of moderate muscle spasm. There was minimal tenderness to palpation of the acromioclavicular and glenohumeral joint regions The patient appeared to be without equal grip strength and Tinel and Phalen's maneuver were without increase of pain of significant degree. Straight leg raising was tolerates approximately 30 without increased pain with dorsiflexion. No sensory deficit or dermatomal distribution was detected. There was negative clonus negative Homans. There was mild tenderness of the PSIS and PIIS regions. 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 May consider pending follow-up evaluations  F/U neurological evaluation. May consider pending follow-up evaluations  May consider radiofrequency rhizolysis or intraspinal procedures pending response to present treatment and F/U evaluation   Patient to call Pain Management Center should patient have concerns prior to scheduled return appointment.          Assessment & Plan:

## 2015-02-05 ENCOUNTER — Other Ambulatory Visit: Payer: Self-pay | Admitting: Pain Medicine

## 2015-02-12 ENCOUNTER — Telehealth: Payer: Self-pay

## 2015-02-12 NOTE — Telephone Encounter (Signed)
Patient wants a knee injection asap

## 2015-02-12 NOTE — Telephone Encounter (Signed)
[facility]  Schedule knee injection for  Monday, 02/19/2015

## 2015-02-15 ENCOUNTER — Ambulatory Visit: Payer: BC Managed Care – PPO | Admitting: Pain Medicine

## 2015-02-19 ENCOUNTER — Ambulatory Visit: Payer: BC Managed Care – PPO | Attending: Pain Medicine | Admitting: Pain Medicine

## 2015-02-19 ENCOUNTER — Encounter: Payer: Self-pay | Admitting: Pain Medicine

## 2015-02-19 VITALS — BP 136/83 | HR 81 | Temp 98.5°F | Resp 16 | Ht 65.0 in | Wt 153.0 lb

## 2015-02-19 DIAGNOSIS — M1711 Unilateral primary osteoarthritis, right knee: Secondary | ICD-10-CM | POA: Insufficient documentation

## 2015-02-19 DIAGNOSIS — M5136 Other intervertebral disc degeneration, lumbar region: Secondary | ICD-10-CM

## 2015-02-19 DIAGNOSIS — M5135 Other intervertebral disc degeneration, thoracolumbar region: Secondary | ICD-10-CM

## 2015-02-19 DIAGNOSIS — M19011 Primary osteoarthritis, right shoulder: Secondary | ICD-10-CM

## 2015-02-19 DIAGNOSIS — M545 Low back pain: Secondary | ICD-10-CM | POA: Insufficient documentation

## 2015-02-19 DIAGNOSIS — M160 Bilateral primary osteoarthritis of hip: Secondary | ICD-10-CM

## 2015-02-19 DIAGNOSIS — M170 Bilateral primary osteoarthritis of knee: Secondary | ICD-10-CM

## 2015-02-19 DIAGNOSIS — M19012 Primary osteoarthritis, left shoulder: Secondary | ICD-10-CM

## 2015-02-19 DIAGNOSIS — M25561 Pain in right knee: Secondary | ICD-10-CM | POA: Diagnosis present

## 2015-02-19 DIAGNOSIS — M159 Polyosteoarthritis, unspecified: Secondary | ICD-10-CM

## 2015-02-19 DIAGNOSIS — M150 Primary generalized (osteo)arthritis: Secondary | ICD-10-CM

## 2015-02-19 DIAGNOSIS — M47816 Spondylosis without myelopathy or radiculopathy, lumbar region: Secondary | ICD-10-CM

## 2015-02-19 MED ORDER — CEFAZOLIN SODIUM 1 G IJ SOLR
INTRAMUSCULAR | Status: AC
  Filled 2015-02-19: qty 10

## 2015-02-19 MED ORDER — LACTATED RINGERS IV SOLN: 1000.0000 mL | INTRAVENOUS | Status: DC

## 2015-02-19 MED ORDER — FENTANYL CITRATE (PF) 100 MCG/2ML IJ SOLN: 100.0000 ug | Freq: Once | INTRAMUSCULAR | Status: DC

## 2015-02-19 MED ORDER — LEVOFLOXACIN 250 MG PO TABS: 250.0000 mg | ORAL_TABLET | Freq: Every day | ORAL | Status: DC

## 2015-02-19 MED ORDER — LEVOFLOXACIN IN D5W 250 MG/50ML IV SOLN: 250.0000 mg | Freq: Once | INTRAVENOUS | Status: DC

## 2015-02-19 MED ORDER — CEFAZOLIN SODIUM 1-5 GM-% IV SOLN
1.0000 g | Freq: Once | INTRAVENOUS | Status: DC
Start: 2015-02-19 — End: 2015-02-19

## 2015-02-19 MED ORDER — MIDAZOLAM HCL 5 MG/5ML IJ SOLN
INTRAMUSCULAR | Status: AC
  Administered 2015-02-19: 3 mg via INTRAVENOUS
  Filled 2015-02-19: qty 5

## 2015-02-19 MED ORDER — BUPIVACAINE HCL (PF) 0.25 % IJ SOLN: 30.0000 mL | Freq: Once | INTRAMUSCULAR | Status: DC

## 2015-02-19 MED ORDER — OXYCODONE HCL 5 MG PO TABS: ORAL_TABLET | ORAL | Status: DC

## 2015-02-19 MED ORDER — CEFUROXIME AXETIL 250 MG PO TABS: 250.0000 mg | ORAL_TABLET | Freq: Two times a day (BID) | ORAL | Status: DC

## 2015-02-19 MED ORDER — LIDOCAINE HCL (PF) 1 % IJ SOLN: 10.0000 mL | Freq: Once | INTRAMUSCULAR | Status: DC

## 2015-02-19 MED ORDER — MIDAZOLAM HCL 5 MG/5ML IJ SOLN: 5.0000 mg | Freq: Once | INTRAMUSCULAR | Status: DC

## 2015-02-19 MED ORDER — TRIAMCINOLONE ACETONIDE 40 MG/ML IJ SUSP
INTRAMUSCULAR | Status: AC
  Administered 2015-02-19: 13:00:00
  Filled 2015-02-19: qty 1

## 2015-02-19 MED ORDER — ORPHENADRINE CITRATE 30 MG/ML IJ SOLN
60.0000 mg | Freq: Once | INTRAMUSCULAR | Status: DC
Start: 2015-02-19 — End: 2017-08-19

## 2015-02-19 MED ORDER — TRIAMCINOLONE ACETONIDE 40 MG/ML IJ SUSP: 40.0000 mg | Freq: Once | INTRAMUSCULAR | Status: DC

## 2015-02-19 MED ORDER — BUPIVACAINE HCL (PF) 0.25 % IJ SOLN
INTRAMUSCULAR | Status: AC
  Administered 2015-02-19: 13:00:00
  Filled 2015-02-19: qty 30

## 2015-02-19 MED ORDER — LEVOFLOXACIN IN D5W 250 MG/50ML IV SOLN
INTRAVENOUS | Status: AC
  Administered 2015-02-19: 13:00:00
  Filled 2015-02-19: qty 50

## 2015-02-19 MED ORDER — FENTANYL CITRATE (PF) 100 MCG/2ML IJ SOLN
INTRAMUSCULAR | Status: AC
  Administered 2015-02-19: 100 ug via INTRAVENOUS
  Filled 2015-02-19: qty 2

## 2015-02-19 MED ORDER — OXYCODONE HCL ER 10 MG PO T12A: EXTENDED_RELEASE_TABLET | ORAL | Status: DC

## 2015-02-19 NOTE — Progress Notes (Signed)
Subjective:    Patient ID: Katrina Simon, female    DOB: [birth year], 64 y.o.   MRN: [id number]  HPI  Geniculate nerve blocks of the right knee   The patient is a 64 y.o. female who returns to the [facility] for further evaluation and treatment of pain involving the lumbar lower extremity region with severe pain of the l right knee. Prior studies reveal patient to be with significant degenerative joint disease of the knee. . The patient has undergone surgical evaluation including injections of knee by surgeon and is with continued pain of severe degree of the knees right We will proceed with geniculate nerve blocks of the right knee in an attempt to decrease severity of symptoms, minimize the risk of medication escalation, hopefully retard progression of symptoms and avoid the need for more involved treatment.  The risks benefits and expectations of the procedure were discussed with the patient. The patient was with understanding and in agreement with suggested treatment plan.  DESCRIPTION OF PROCEDURE: Geniculate nerve blocks of the right knee. The  procedure was performed with IV Versed and IV fentanyl, conscious sedation and under fluoroscopic guidance.  NEEDLE PLACEMENT FOR BLOCK OF THE LATERAL SUPERIOR GENICULATE NERVE: The patient was taking to the fluoroscopy suite. With the patient supine, with knee in flexed position, Betadine prep of proposed entry site accomplished.  IV Versed, IV fentanyl conscious sedation, EKG, blood pressure, pulse and pulse oximetry monitoring were all in place. Under fluoroscopic guidance, a 22-gauge needle was inserted in the region of the right knee with needle placed at the lateral border of the femur at the junction of the shaft of the femur and the condyle of the femur.  Following needle placement at the lateral aspect of the knee, needle placement was then accomplished in the region of the medial aspect of the knee.  NEEDLE PLACEMENT FOR BLOCK OF THE  MEDIAL SUPERIOR GENICULATE NERVE:  Under fluoroscopic guidance, a 22 - gauge needle was inserted in the region of the right knee with needle placed at the medial border of the femur at the junction of the shaft of the femur and the condyle of the femur.   NEEDLE PLACEMENT FOR BLOCK OF THE MEDIAL INFERIOR GENICULATE NERVE:  Under fluoroscopic guidance, a 22 - gauge needle was inserted in the region of the right knee with needle placed at the junction of the shaft and plateau of the tibia.   Following needle placement on AP view of needles placed in all three locations, placement was then verified on lateral view with the tips of the superior lateral and superior medial needles documented to be one half the distance of the shaft of the femur and the tip of the inferior medial geniculate needle documented to be one half the distance of the shaft of the tibia.  Following documentation of needle placements on lateral view, each needle was injected with one mL of 0.25% bupivacaine with Kenalog. A total of 10 mg of Kenalog was utilized for the entire procedure. The patient tolerated the procedure well.    PLAN 1. Medications: Continue present medications oxycodone and OxyContin 2. Follow-up appointment with PCP Dr. Caryn Section for evaluation of blood pressure and general medical condition. 3. Follow-up surgical evaluation 4. Follow-up neurological evaluation 5. He patient may be a candidate for radiofrequency rhizolysis and other treatment pending response to treatment on today's visit and follow-up evaluation. 6. The patient is advised to adhere to proper body mechanics and avoid activities  which appear to aggravate condition   Review of Systems     Objective:   Physical Exam        Assessment & Plan:

## 2015-02-19 NOTE — Patient Instructions (Addendum)
Continue present medications OxyContin and oxycodone and begin antibiotic Levaquin today as prescribed AND NOT TAKE CEFTIN  F/U PCP Dr. Caryn Section for evaliation of  BP and general medical  Condition.  F/U surgical evaluation. To be considered as discussed  F/U neurological evaluation. May consider  May consider radiofrequency rhizolysis or intraspinal procedures pending response to present treatment and F/U evaluation.  Patient to call Pain Management Center should patient have concerns prior to scheduled return appointment.   Knee Injection A knee injection is a procedure to get medicine into your knee joint. Your health care provider puts a needle into the joint and injects medicine with an attached syringe. The injected medicine may relieve the pain, swelling, and stiffness of arthritis. The injected medicine may also help to lubricate and cushion your knee joint. You may need more than one injection. LET [facility] CARE PROVIDER KNOW ABOUT:  Any allergies you have.  All medicines you are taking, including vitamins, herbs, eye drops, creams, and over-the-counter medicines.  Previous problems you or members of your family have had with the use of anesthetics.  Any blood disorders you have.  Previous surgeries you have had.  Any medical conditions you may have. RISKS AND COMPLICATIONS Generally, this is a safe procedure. However, problems may occur, including:  Infection.  Bleeding.  Worsening symptoms.  Damage to the area around your knee.  Allergic reaction to any of the medicines.  Skin reactions from repeated injections. BEFORE THE PROCEDURE  Ask your health care provider about changing or stopping your regular medicines. This is especially important if you are taking diabetes medicines or blood thinners.  Plan to have someone take you home after the procedure. PROCEDURE  You will sit or lie down in a position for your knee to be treated.  The skin over your  kneecap will be cleaned with a germ-killing solution (antiseptic).  You will be given a medicine that numbs the area (local anesthetic). You may feel some stinging.  After your knee becomes numb, you will have a second injection. This is the medicine. This needle is carefully placed between your kneecap and your knee. The medicine is injected into the joint space.  At the end of the procedure, the needle will be removed.  A bandage (dressing) may be placed over the injection site. The procedure may vary among health care providers and hospitals. AFTER THE PROCEDURE  You may have to move your knee through its full range of motion. This helps to get all of the medicine into your joint space.  Your blood pressure, heart rate, breathing rate, and blood oxygen level will be monitored often until the medicines you were given have worn off.  You will be watched to make sure that you do not have a reaction to the injected medicine.   This information is not intended to replace advice given to you by your health care provider. Make sure you discuss any questions you have with your health care provider.   Document Released: 05/25/2006 Document Revised: 03/24/2014 Document Reviewed: 01/11/2014 [employer] Interactive Patient Education 2016 [employer] Pain Management Discharge Instructions  General Discharge Instructions :  If you need to reach your doctor call: Monday-Friday 8:00 am - 4:00 pm at [phone] or toll free [phone].  After clinic hours [phone] to have operator reach doctor.  Bring all of your medication bottles to all your appointments in the pain clinic.  To cancel or reschedule your appointment with Pain Management please remember to call 24  hours in advance to avoid a fee.  Refer to the educational materials which you have been given on: General Risks, I had my Procedure. Discharge Instructions, Post Sedation.  Post Procedure Instructions:  The drugs you were  given will stay in your system until tomorrow, so for the next 24 hours you should not drive, make any legal decisions or drink any alcoholic beverages.  You may eat anything you prefer, but it is better to start with liquids then soups and crackers, and gradually work up to solid foods.  Please notify your doctor immediately if you have any unusual bleeding, trouble breathing or pain that is not related to your normal pain.  Depending on the type of procedure that was done, some parts of your body may feel week and/or numb.  This usually clears up by tonight or the next day.  Walk with the use of an assistive device or accompanied by an adult for the 24 hours.  You may use ice on the affected area for the first 24 hours.  Put ice in a Ziploc bag and cover with a towel and place against area 15 minutes on 15 minutes off.  You may switch to heat after 24 hours.

## 2015-02-20 ENCOUNTER — Telehealth: Payer: Self-pay | Admitting: *Deleted

## 2015-02-20 NOTE — Telephone Encounter (Signed)
Left voicemail with patient to call our office if she has any questions or concerns re; procedure on yesterday.

## 2015-03-02 ENCOUNTER — Other Ambulatory Visit: Payer: Self-pay | Admitting: Pain Medicine

## 2015-03-22 ENCOUNTER — Encounter: Payer: Self-pay | Admitting: Pain Medicine

## 2015-03-22 ENCOUNTER — Ambulatory Visit: Payer: BC Managed Care – PPO | Attending: Pain Medicine | Admitting: Pain Medicine

## 2015-03-22 VITALS — BP 116/62 | HR 73 | Temp 98.5°F | Resp 16 | Ht 65.0 in | Wt 157.0 lb

## 2015-03-22 DIAGNOSIS — M159 Polyosteoarthritis, unspecified: Secondary | ICD-10-CM

## 2015-03-22 DIAGNOSIS — M19011 Primary osteoarthritis, right shoulder: Secondary | ICD-10-CM | POA: Diagnosis not present

## 2015-03-22 DIAGNOSIS — M150 Primary generalized (osteo)arthritis: Secondary | ICD-10-CM

## 2015-03-22 DIAGNOSIS — M19012 Primary osteoarthritis, left shoulder: Secondary | ICD-10-CM | POA: Insufficient documentation

## 2015-03-22 DIAGNOSIS — M160 Bilateral primary osteoarthritis of hip: Secondary | ICD-10-CM

## 2015-03-22 DIAGNOSIS — M5136 Other intervertebral disc degeneration, lumbar region: Secondary | ICD-10-CM | POA: Diagnosis not present

## 2015-03-22 DIAGNOSIS — M25511 Pain in right shoulder: Secondary | ICD-10-CM | POA: Diagnosis present

## 2015-03-22 DIAGNOSIS — M47816 Spondylosis without myelopathy or radiculopathy, lumbar region: Secondary | ICD-10-CM | POA: Diagnosis not present

## 2015-03-22 DIAGNOSIS — M533 Sacrococcygeal disorders, not elsewhere classified: Secondary | ICD-10-CM | POA: Diagnosis not present

## 2015-03-22 DIAGNOSIS — M170 Bilateral primary osteoarthritis of knee: Secondary | ICD-10-CM | POA: Diagnosis not present

## 2015-03-22 DIAGNOSIS — M542 Cervicalgia: Secondary | ICD-10-CM | POA: Diagnosis present

## 2015-03-22 DIAGNOSIS — M5135 Other intervertebral disc degeneration, thoracolumbar region: Secondary | ICD-10-CM

## 2015-03-22 DIAGNOSIS — M25512 Pain in left shoulder: Secondary | ICD-10-CM | POA: Diagnosis present

## 2015-03-22 MED ORDER — OXYCODONE HCL 5 MG PO TABS: ORAL_TABLET | ORAL | Status: DC

## 2015-03-22 MED ORDER — OXYCODONE HCL ER 10 MG PO T12A: EXTENDED_RELEASE_TABLET | ORAL | Status: DC

## 2015-03-22 NOTE — Patient Instructions (Addendum)
PLAN   Continue present medication OxyContin and oxycodone  Shoulder injection to be performed at time of return appointment  F/U PCP Dr. Caryn Section for evaliation of  BP and general medical  condition  F/U surgical evaluation. May consider pending follow-up evaluations  F/U neurological evaluation. May consider pending follow-up evaluations  May consider radiofrequency rhizolysis or intraspinal procedures pending response to present treatment and F/U evaluation   Patient to call Pain Management Center should patient have concerns prior to scheduled return appointment.GENERAL RISKS AND COMPLICATIONS  What are the risk, side effects and possible complications? Generally speaking, most procedures are safe.  However, with any procedure there are risks, side effects, and the possibility of complications.  The risks and complications are dependent upon the sites that are lesioned, or the type of nerve block to be performed.  The closer the procedure is to the spine, the more serious the risks are.  Great care is taken when placing the radio frequency needles, block needles or lesioning probes, but sometimes complications can occur. 1. Infection: Any time there is an injection through the skin, there is a risk of infection.  This is why sterile conditions are used for these blocks.  There are four possible types of infection. 1. Localized skin infection. 2. Central Nervous System Infection-This can be in the form of Meningitis, which can be deadly. 3. Epidural Infections-This can be in the form of an epidural abscess, which can cause pressure inside of the spine, causing compression of the spinal cord with subsequent paralysis. This would require an emergency surgery to decompress, and there are no guarantees that the patient would recover from the paralysis. 4. Discitis-This is an infection of the intervertebral discs.  It occurs in about 1% of discography procedures.  It is difficult to treat and it  may lead to surgery.        2. Pain: the needles have to go through skin and soft tissues, will cause soreness.       3. Damage to internal structures:  The nerves to be lesioned may be near blood vessels or    other nerves which can be potentially damaged.       4. Bleeding: Bleeding is more common if the patient is taking blood thinners such as  aspirin, Coumadin, Ticiid, Plavix, etc., or if he/she have some genetic predisposition  such as hemophilia. Bleeding into the spinal canal can cause compression of the spinal  cord with subsequent paralysis.  This would require an emergency surgery to  decompress and there are no guarantees that the patient would recover from the  paralysis.       5. Pneumothorax:  Puncturing of a lung is a possibility, every time a needle is introduced in  the area of the chest or upper back.  Pneumothorax refers to free air around the  collapsed lung(s), inside of the thoracic cavity (chest cavity).  Another two possible  complications related to a similar event would include: Hemothorax and Chylothorax.   These are variations of the Pneumothorax, where instead of air around the collapsed  lung(s), you may have blood or chyle, respectively.       6. Spinal headaches: They may occur with any procedures in the area of the spine.       7. Persistent CSF (Cerebro-Spinal Fluid) leakage: This is a rare problem, but may occur  with prolonged intrathecal or epidural catheters either due to the formation of a fistulous  track or a dural tear.  8. Nerve damage: By working so close to the spinal cord, there is always a possibility of  nerve damage, which could be as serious as a permanent spinal cord injury with  paralysis.       9. Death:  Although rare, severe deadly allergic reactions known as "Anaphylactic  reaction" can occur to any of the medications used.      10. Worsening of the symptoms:  We can always make thing worse.  What are the chances of something like this  happening? Chances of any of this occuring are extremely low.  By statistics, you have more of a chance of getting killed in a motor vehicle accident: while driving to the hospital than any of the above occurring .  Nevertheless, you should be aware that they are possibilities.  In general, it is similar to taking a shower.  Everybody knows that you can slip, hit your head and get killed.  Does that mean that you should not shower again?  Nevertheless always keep in mind that statistics do not mean anything if you happen to be on the wrong side of them.  Even if a procedure has a 1 (one) in a 1,000,000 (million) chance of going wrong, it you happen to be that one..Also, keep in mind that by statistics, you have more of a chance of having something go wrong when taking medications.  Who should not have this procedure? If you are on a blood thinning medication (e.g. Coumadin, Plavix, see list of "Blood Thinners"), or if you have an active infection going on, you should not have the procedure.  If you are taking any blood thinners, please inform your physician.  How should I prepare for this procedure?  Do not eat or drink anything at least six hours prior to the procedure.  Bring a driver with you .  It cannot be a taxi.  Come accompanied by an adult that can drive you back, and that is strong enough to help you if your legs get weak or numb from the local anesthetic.  Take all of your medicines the morning of the procedure with just enough water to swallow them.  If you have diabetes, make sure that you are scheduled to have your procedure done first thing in the morning, whenever possible.  If you have diabetes, take only half of your insulin dose and notify our nurse that you have done so as soon as you arrive at the clinic.  If you are diabetic, but only take blood sugar pills (oral hypoglycemic), then do not take them on the morning of your procedure.  You may take them after you have had the  procedure.  Do not take aspirin or any aspirin-containing medications, at least eleven (11) days prior to the procedure.  They may prolong bleeding.  Wear loose fitting clothing that may be easy to take off and that you would not mind if it got stained with Betadine or blood.  Do not wear any jewelry or perfume  Remove any nail coloring.  It will interfere with some of our monitoring equipment.  NOTE: Remember that this is not meant to be interpreted as a complete list of all possible complications.  Unforeseen problems may occur.  BLOOD THINNERS The following drugs contain aspirin or other products, which can cause increased bleeding during surgery and should not be taken for 2 weeks prior to and 1 week after surgery.  If you should need take something for relief of minor  pain, you may take acetaminophen which is found in Tylenol,m Datril, Anacin-3 and Panadol. It is not blood thinner. The products listed below are.  Do not take any of the products listed below in addition to any listed on your instruction sheet.  A.P.C or A.P.C with Codeine Codeine Phosphate Capsules #3 Ibuprofen Ridaura  ABC compound Congesprin Imuran rimadil  Advil Cope Indocin Robaxisal  Alka-Seltzer Effervescent Pain Reliever and Antacid Coricidin or Coricidin-D  Indomethacin Rufen  Alka-Seltzer plus Cold Medicine Cosprin Ketoprofen S-A-C Tablets  Anacin Analgesic Tablets or Capsules Coumadin Korlgesic Salflex  Anacin Extra Strength Analgesic tablets or capsules CP-2 Tablets Lanoril Salicylate  Anaprox Cuprimine Capsules Levenox Salocol  Anexsia-D Dalteparin Magan Salsalate  Anodynos Darvon compound Magnesium Salicylate Sine-off  Ansaid Dasin Capsules Magsal Sodium Salicylate  Anturane Depen Capsules Marnal Soma  APF Arthritis pain formula Dewitt's Pills Measurin Stanback  Argesic Dia-Gesic Meclofenamic Sulfinpyrazone  Arthritis Bayer Timed Release Aspirin Diclofenac Meclomen Sulindac  Arthritis pain formula  Anacin Dicumarol Medipren Supac  Analgesic (Safety coated) Arthralgen Diffunasal Mefanamic Suprofen  Arthritis Strength Bufferin Dihydrocodeine Mepro Compound Suprol  Arthropan liquid Dopirydamole Methcarbomol with Aspirin Synalgos  ASA tablets/Enseals Disalcid Micrainin Tagament  Ascriptin Doan's Midol Talwin  Ascriptin A/D Dolene Mobidin Tanderil  Ascriptin Extra Strength Dolobid Moblgesic Ticlid  Ascriptin with Codeine Doloprin or Doloprin with Codeine Momentum Tolectin  Asperbuf Duoprin Mono-gesic Trendar  Aspergum Duradyne Motrin or Motrin IB Triminicin  Aspirin plain, buffered or enteric coated Durasal Myochrisine Trigesic  Aspirin Suppositories Easprin Nalfon Trillsate  Aspirin with Codeine Ecotrin Regular or Extra Strength Naprosyn Uracel  Atromid-S Efficin Naproxen Ursinus  Auranofin Capsules Elmiron Neocylate Vanquish  Axotal Emagrin Norgesic Verin  Azathioprine Empirin or Empirin with Codeine Normiflo Vitamin E  Azolid Emprazil Nuprin Voltaren  Bayer Aspirin plain, buffered or children's or timed BC Tablets or powders Encaprin Orgaran Warfarin Sodium  Buff-a-Comp Enoxaparin Orudis Zorpin  Buff-a-Comp with Codeine Equegesic Os-Cal-Gesic   Buffaprin Excedrin plain, buffered or Extra Strength Oxalid   Bufferin Arthritis Strength Feldene Oxphenbutazone   Bufferin plain or Extra Strength Feldene Capsules Oxycodone with Aspirin   Bufferin with Codeine Fenoprofen Fenoprofen Pabalate or Pabalate-SF   Buffets II Flogesic Panagesic   Buffinol plain or Extra Strength Florinal or Florinal with Codeine Panwarfarin   Buf-Tabs Flurbiprofen Penicillamine   Butalbital Compound Four-way cold tablets Penicillin   Butazolidin Fragmin Pepto-Bismol   Carbenicillin Geminisyn Percodan   Carna Arthritis Reliever Geopen Persantine   Carprofen Gold's salt Persistin   Chloramphenicol Goody's Phenylbutazone   Chloromycetin Haltrain Piroxlcam   Clmetidine heparin Plaquenil   Cllnoril Hyco-pap  Ponstel   Clofibrate Hydroxy chloroquine Propoxyphen         Before stopping any of these medications, be sure to consult the physician who ordered them.  Some, such as Coumadin (Warfarin) are ordered to prevent or treat serious conditions such as "deep thrombosis", "pumonary embolisms", and other heart problems.  The amount of time that you may need off of the medication may also vary with the medication and the reason for which you were taking it.  If you are taking any of these medications, please make sure you notify your pain physician before you undergo any procedures.         Pain Management Discharge Instructions  General Discharge Instructions :  If you need to reach your doctor call: Monday-Friday 8:00 am - 4:00 pm at [phone] or toll free [phone].  After clinic hours [phone] to have operator reach doctor.  Bring all of  your medication bottles to all your appointments in the pain clinic.  To cancel or reschedule your appointment with Pain Management please remember to call 24 hours in advance to avoid a fee.  Refer to the educational materials which you have been given on: General Risks, I had my Procedure. Discharge Instructions, Post Sedation.  Post Procedure Instructions:  The drugs you were given will stay in your system until tomorrow, so for the next 24 hours you should not drive, make any legal decisions or drink any alcoholic beverages.  You may eat anything you prefer, but it is better to start with liquids then soups and crackers, and gradually work up to solid foods.  Please notify your doctor immediately if you have any unusual bleeding, trouble breathing or pain that is not related to your normal pain.  Depending on the type of procedure that was done, some parts of your body may feel week and/or numb.  This usually clears up by tonight or the next day.  Walk with the use of an assistive device or accompanied by an adult for the 24  hours.  You may use ice on the affected area for the first 24 hours.  Put ice in a Ziploc bag and cover with a towel and place against area 15 minutes on 15 minutes off.  You may switch to heat after 24 hours.Trigger Point Injection Trigger points are areas where you have muscle pain. A trigger point injection is a shot given in the trigger point to relieve that pain. A trigger point might feel like a knot in your muscle. It hurts to press on a trigger point. Sometimes the pain spreads out (radiates) to other parts of the body. For example, pressing on a trigger point in your shoulder might cause pain in your arm or neck. You might have one trigger point. Or, you might have more than one. People often have trigger points in their upper back and lower back. They also occur often in the neck and shoulders. Pain from a trigger point lasts for a long time. It can make it hard to keep moving. You might not be able to do the exercise or physical therapy that could help you deal with the pain. A trigger point injection may help. It does not work for everyone. But, it may relieve your pain for a few days or a few months. A trigger point injection does not cure long-lasting (chronic) pain. LET YOUR CAREGIVER KNOW ABOUT: 2. Any allergies (especially to latex, lidocaine, or steroids). 3. Blood-thinning medicines that you take. These drugs can lead to bleeding or bruising after an injection. They include: 1. Aspirin. 2. Ibuprofen. 3. Clopidogrel. 4. Warfarin. 4. Other medicines you take. This includes all vitamins, herbs, eyedrops, over-the-counter medicines, and creams. 5. Use of steroids. 6. Recent infections. 7. Past problems with numbing medicines. 8. Bleeding problems. 9. Surgeries you have had. 10. Other health problems. RISKS AND COMPLICATIONS A trigger point injection is a safe treatment. However, problems may develop, such as:  Minor side effects usually go away in 1 to 2 days. These may  include:  Soreness.  Bruising.  Stiffness.  More serious problems are rare. But, they may include:  Bleeding under the skin (hematoma).  Skin infection.  Breaking off of the needle under your skin.  Lung puncture.  The trigger point injection may not work for you. BEFORE THE PROCEDURE You may need to stop taking any medicine that thins your blood. This is to prevent  bleeding and bruising. Usually these medicines are stopped several days before the injection. No other preparation is needed. PROCEDURE  A trigger point injection can be given in your caregiver's office or in a clinic. Each injection takes 2 minutes or less.  Your caregiver will feel for trigger points. The caregiver may use a marker to circle the area for the injection.  The skin over the trigger point will be washed with a germ-killing (antiseptic) solution.  The caregiver pinches the spot for the injection.  Then, a very thin needle is used for the shot. You may feel pain or a twitching feeling when the needle enters the trigger point.  A numbing solution may be injected into the trigger point. Sometimes a drug to keep down swelling, redness, and warmth (inflammation) is also injected.  Your caregiver moves the needle around the trigger zone until the tightness and twitching goes away.  After the injection, your caregiver may put gentle pressure over the injection site.  Then it is covered with a bandage. AFTER THE PROCEDURE  You can go right home after the injection.  The bandage can be taken off after a few hours.  You may feel sore and stiff for 1 to 2 days.  Go back to your regular activities slowly. Your caregiver may ask you to stretch your muscles. Do not do anything that takes extra energy for a few days.  Follow your caregiver's instructions to manage and treat other pain.   This information is not intended to replace advice given to you by your health care provider. Make sure you discuss  any questions you have with your health care provider.   Document Released: 02/20/2011 Document Revised: 06/28/2012 Document Reviewed: 02/20/2011 Elsevier Interactive Patient Education [employer].

## 2015-03-22 NOTE — Progress Notes (Signed)
   Subjective:    Patient ID: Katrina Simon, female    DOB: Aug 09, 1950, 65 y.o.   MRN: [id number]  HPI The patient is a 65 year old female who returns to pain management for further evaluation and treatment of pain involving the neck shoulders and upper mid lower back and lower extremity regions. The patient states that she has had some return of significant pain involving the region of the shoulder. The patient states she is in hopes of being able to undergo injection of the shoulder at time of return appointment. The patient denies any trauma change in events of daily living the cost change in symptomatology. The patient states that due to previous treatment in pain management standard that she has been able to return to riding horses and is able to perform activities of daily living without severely disabling pain interfering with activities of daily living. The patient continues OxyContin and oxycodone and we will proceed with shoulder injection at time return appointment in attempt to decrease severity of symptoms, minimize progression of symptoms, and avoid the need for more involved treatment. The patient agreed to suggested treatment plan     Review of Systems     Objective:   Physical Exam there was tenderness to palpation over the splenius capitis and occipitalis musculature regions of mild to moderate degree. Palpation of the cervical facet cervical paraspinal musculature regions reproduce mild to moderate discomfort. There was mild to moderate tenderness to palpation over the thoracic facet thoracic paraspinal musculature region without crepitus of the thoracic region noted. Palpation of the acromioclavicular and glenohumeral joint regions reproduces moderate to moderately severe pain with limited range of motion of the shoulder. Patient had some difficulty performing drop test. Tinel and Phalen's maneuver were without increase of pain and grip strength appeared to be slightly decreased.  Palpation over the lumbar paraspinal musculature region lumbar facet region was resistance to palpation of moderate degree with lateral bending rotation extension and palpation of the lumbar facets reproducing moderate discomfort. There was tenderness over the PSIS and PII S region as well as the gluteal and piriformis musculature region a mild degree. Mild tenderness of the greater trochanteric region iliotibial band region was noted. Lateral bending rotation extension and palpation of the lumbar facets reproduced pain of a moderate degree on reexamination of the patient.. No sensory deficit or dermatomal distribution was detected. There was negative clonus negative Homans. Abdomen was nontender with no costovertebral angle tenderness noted.      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 Caryn Section for evaliation of  BP and general medical  condition  F/U surgical evaluation. May consider pending follow-up evaluations  F/U neurological evaluation. May consider pending follow-up evaluations  May consider radiofrequency rhizolysis or intraspinal procedures pending response to present treatment and F/U evaluation   Patient to call Pain Management Center should patient have concerns prior to scheduled return appointment.

## 2015-03-22 NOTE — Progress Notes (Signed)
Safety precautions to be maintained throughout the outpatient stay will include: orient to surroundings, keep bed in low position, maintain call bell within reach at all times, provide assistance with transfer out of bed and ambulation.  

## 2015-04-18 ENCOUNTER — Ambulatory Visit: Payer: BC Managed Care – PPO | Attending: Pain Medicine | Admitting: Pain Medicine

## 2015-04-18 ENCOUNTER — Encounter: Payer: Self-pay | Admitting: Pain Medicine

## 2015-04-18 VITALS — BP 108/54 | HR 69 | Temp 96.6°F | Resp 14 | Ht 65.0 in | Wt 160.0 lb

## 2015-04-18 DIAGNOSIS — M533 Sacrococcygeal disorders, not elsewhere classified: Secondary | ICD-10-CM | POA: Diagnosis not present

## 2015-04-18 DIAGNOSIS — M51369 Other intervertebral disc degeneration, lumbar region without mention of lumbar back pain or lower extremity pain: Secondary | ICD-10-CM

## 2015-04-18 DIAGNOSIS — M5136 Other intervertebral disc degeneration, lumbar region: Secondary | ICD-10-CM | POA: Diagnosis not present

## 2015-04-18 DIAGNOSIS — M5135 Other intervertebral disc degeneration, thoracolumbar region: Secondary | ICD-10-CM

## 2015-04-18 DIAGNOSIS — M5030 Other cervical disc degeneration, unspecified cervical region: Secondary | ICD-10-CM | POA: Insufficient documentation

## 2015-04-18 DIAGNOSIS — M47816 Spondylosis without myelopathy or radiculopathy, lumbar region: Secondary | ICD-10-CM

## 2015-04-18 DIAGNOSIS — M19012 Primary osteoarthritis, left shoulder: Secondary | ICD-10-CM | POA: Diagnosis not present

## 2015-04-18 DIAGNOSIS — M542 Cervicalgia: Secondary | ICD-10-CM | POA: Diagnosis present

## 2015-04-18 DIAGNOSIS — M546 Pain in thoracic spine: Secondary | ICD-10-CM | POA: Diagnosis present

## 2015-04-18 DIAGNOSIS — M25512 Pain in left shoulder: Secondary | ICD-10-CM | POA: Diagnosis present

## 2015-04-18 DIAGNOSIS — M19011 Primary osteoarthritis, right shoulder: Secondary | ICD-10-CM

## 2015-04-18 DIAGNOSIS — M170 Bilateral primary osteoarthritis of knee: Secondary | ICD-10-CM

## 2015-04-18 DIAGNOSIS — M150 Primary generalized (osteo)arthritis: Secondary | ICD-10-CM

## 2015-04-18 DIAGNOSIS — M160 Bilateral primary osteoarthritis of hip: Secondary | ICD-10-CM

## 2015-04-18 DIAGNOSIS — M159 Polyosteoarthritis, unspecified: Secondary | ICD-10-CM

## 2015-04-18 MED ORDER — OXYCODONE HCL ER 10 MG PO T12A: EXTENDED_RELEASE_TABLET | ORAL | Status: DC

## 2015-04-18 MED ORDER — OXYCODONE HCL 5 MG PO TABS: ORAL_TABLET | ORAL | Status: DC

## 2015-04-18 NOTE — Progress Notes (Signed)
Safety precautions to be maintained throughout the outpatient stay will include: orient to surroundings, keep bed in low position, maintain call bell within reach at all times, provide assistance with transfer out of bed and ambulation.  

## 2015-04-18 NOTE — Progress Notes (Signed)
   Subjective:    Patient ID: Katrina Simon, female    DOB: [birth year], 65 y.o.   MRN: [id number]  HPI  The patient is a 65 year old female who returns to pain management for further evaluation and treatment of pain involving the neck shoulders entire back upper and lower extremity regions. The patient states that the back pain is fairly well-controlled at this time. The patient has significant pain and limited range of motion of the left shoulder. The patient is without known trauma change in events of daily living which could've contributed to the pain and paresthesias of the left shoulder. At the present time we will continue present medications and will proceed with interventional treatment of the shoulder. We will also refer patient for surgical evaluation to discuss results of MRI of the shoulder and for further recommendations regarding patient treatment. The patient was with understanding and agreement suggested treatment plan       Review of Systems     Objective:   Physical Exam  There was tends to palpation of paraspinal musculature in the cervical region cervical facet region a mild to moderate degree with mild to moderate tenderness of the splenius capitis and occipitalis musculature regions. Palpation of the acromioclavicular and glenohumeral joint regions reproduce severe discomfort on the left and there was significantly limited range of motion of the left shoulder with inability to a isn't ABduct the left shoulder to 90. I There was decreased grip strength with Tinel and Phalen's maneuver reproducing minimal discomfort palpation of the thoracic facet thoracic paraspinal must reason was with tenderness to palpation of moderate degree.. There was moderate muscle spasms noted in the thoracic musculature region. Palpation over the lumbar paraspinal must reason lumbar facet region was with minimal discomfort with lateral bending rotation extension and palpation of the lumbar facets  reproducing minimal discomfort. There was minimal tenderness along the greater trochanteric region and iliotibial band region. Straight leg raise was tolerates approximately 30 without increase of pain with dorsiflexion noted. DTRs appeared to be trace at the knees and there was negative clonus negative Homans with no sensory deficit of dermatomal distribution detected. The knees were with mild tenderness to palpation with crepitus with negative anterior and posterior drawer signs without ballottement of the patella. 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 Caryn Section for evaliation of  BP and general medical  condition  F/U surgical evaluation. May consider pending follow-up evaluations  Ask the nurses and secretary the date of MRI of the left shoulder  F/U neurological evaluation. May consider pending follow-up evaluations  May consider radiofrequency rhizolysis or intraspinal procedures pending response to present treatment and F/U evaluation   Patient to call Pain Management Center should patient have concerns prior to scheduled return appointment

## 2015-04-18 NOTE — Patient Instructions (Addendum)
PLAN   Continue present medication OxyContin and oxycodo  Shoulder injection to be performed at time of return appointment  F/U PCP Dr. Caryn Section for evaliation of  BP and general medical  condition  F/U surgical evaluation. May consider pending follow-up evaluations  Ask the nurses and secretary the date of MRI of the left shoulder  F/U neurological evaluation. May consider pending follow-up evaluations  May consider radiofrequency rhizolysis or intraspinal procedures pending response to present treatment and F/U evaluation   Patient to call Pain Management Center should patient have concerns prior to scheduled return appointmentPain Management Discharge Instructions  General Discharge Instructions :  If you need to reach your doctor call: Monday-Friday 8:00 am - 4:00 pm at [phone] or toll free [phone].  After clinic hours [phone] to have operator reach doctor.  Bring all of your medication bottles to all your appointments in the pain clinic.  To cancel or reschedule your appointment with Pain Management please remember to call 24 hours in advance to avoid a fee.  Refer to the educational materials which you have been given on: General Risks, I had my Procedure. Discharge Instructions, Post Sedation.  Post Procedure Instructions:  The drugs you were given will stay in your system until tomorrow, so for the next 24 hours you should not drive, make any legal decisions or drink any alcoholic beverages.  You may eat anything you prefer, but it is better to start with liquids then soups and crackers, and gradually work up to solid foods.  Please notify your doctor immediately if you have any unusual bleeding, trouble breathing or pain that is not related to your normal pain.  Depending on the type of procedure that was done, some parts of your body may feel week and/or numb.  This usually clears up by tonight or the next day.  Walk with the use of an assistive device  or accompanied by an adult for the 24 hours.  You may use ice on the affected area for the first 24 hours.  Put ice in a Ziploc bag and cover with a towel and place against area 15 minutes on 15 minutes off.  You may switch to heat after 24 hours.GENERAL RISKS AND COMPLICATIONS  What are the risk, side effects and possible complications? Generally speaking, most procedures are safe.  However, with any procedure there are risks, side effects, and the possibility of complications.  The risks and complications are dependent upon the sites that are lesioned, or the type of nerve block to be performed.  The closer the procedure is to the spine, the more serious the risks are.  Great care is taken when placing the radio frequency needles, block needles or lesioning probes, but sometimes complications can occur.  Infection: Any time there is an injection through the skin, there is a risk of infection.  This is why sterile conditions are used for these blocks.  There are four possible types of infection.  Localized skin infection.  Central Nervous System Infection-This can be in the form of Meningitis, which can be deadly.  Epidural Infections-This can be in the form of an epidural abscess, which can cause pressure inside of the spine, causing compression of the spinal cord with subsequent paralysis. This would require an emergency surgery to decompress, and there are no guarantees that the patient would recover from the paralysis.  Discitis-This is an infection of the intervertebral discs.  It occurs in about 1% of discography procedures.  It is difficult to  treat and it may lead to surgery.        2. Pain: the needles have to go through skin and soft tissues, will cause soreness.       3. Damage to internal structures:  The nerves to be lesioned may be near blood vessels or    other nerves which can be potentially damaged.       4. Bleeding: Bleeding is more common if the patient is taking blood  thinners such as  aspirin, Coumadin, Ticiid, Plavix, etc., or if he/she have some genetic predisposition  such as hemophilia. Bleeding into the spinal canal can cause compression of the spinal  cord with subsequent paralysis.  This would require an emergency surgery to  decompress and there are no guarantees that the patient would recover from the  paralysis.       5. Pneumothorax:  Puncturing of a lung is a possibility, every time a needle is introduced in  the area of the chest or upper back.  Pneumothorax refers to free air around the  collapsed lung(s), inside of the thoracic cavity (chest cavity).  Another two possible  complications related to a similar event would include: Hemothorax and Chylothorax.   These are variations of the Pneumothorax, where instead of air around the collapsed  lung(s), you may have blood or chyle, respectively.       6. Spinal headaches: They may occur with any procedures in the area of the spine.       7. Persistent CSF (Cerebro-Spinal Fluid) leakage: This is a rare problem, but may occur  with prolonged intrathecal or epidural catheters either due to the formation of a fistulous  track or a dural tear.       8. Nerve damage: By working so close to the spinal cord, there is always a possibility of  nerve damage, which could be as serious as a permanent spinal cord injury with  paralysis.       9. Death:  Although rare, severe deadly allergic reactions known as "Anaphylactic  reaction" can occur to any of the medications used.      10. Worsening of the symptoms:  We can always make thing worse.  What are the chances of something like this happening? Chances of any of this occuring are extremely low.  By statistics, you have more of a chance of getting killed in a motor vehicle accident: while driving to the hospital than any of the above occurring .  Nevertheless, you should be aware that they are possibilities.  In general, it is similar to taking a shower.  Everybody knows  that you can slip, hit your head and get killed.  Does that mean that you should not shower again?  Nevertheless always keep in mind that statistics do not mean anything if you happen to be on the wrong side of them.  Even if a procedure has a 1 (one) in a 1,000,000 (million) chance of going wrong, it you happen to be that one..Also, keep in mind that by statistics, you have more of a chance of having something go wrong when taking medications.  Who should not have this procedure? If you are on a blood thinning medication (e.g. Coumadin, Plavix, see list of "Blood Thinners"), or if you have an active infection going on, you should not have the procedure.  If you are taking any blood thinners, please inform your physician.  How should I prepare for this procedure?  Do not eat or  drink anything at least six hours prior to the procedure.  Bring a driver with you .  It cannot be a taxi.  Come accompanied by an adult that can drive you back, and that is strong enough to help you if your legs get weak or numb from the local anesthetic.  Take all of your medicines the morning of the procedure with just enough water to swallow them.  If you have diabetes, make sure that you are scheduled to have your procedure done first thing in the morning, whenever possible.  If you have diabetes, take only half of your insulin dose and notify our nurse that you have done so as soon as you arrive at the clinic.  If you are diabetic, but only take blood sugar pills (oral hypoglycemic), then do not take them on the morning of your procedure.  You may take them after you have had the procedure.  Do not take aspirin or any aspirin-containing medications, at least eleven (11) days prior to the procedure.  They may prolong bleeding.  Wear loose fitting clothing that may be easy to take off and that you would not mind if it got stained with Betadine or blood.  Do not wear any jewelry or perfume  Remove any nail  coloring.  It will interfere with some of our monitoring equipment.  NOTE: Remember that this is not meant to be interpreted as a complete list of all possible complications.  Unforeseen problems may occur.  BLOOD THINNERS The following drugs contain aspirin or other products, which can cause increased bleeding during surgery and should not be taken for 2 weeks prior to and 1 week after surgery.  If you should need take something for relief of minor pain, you may take acetaminophen which is found in Tylenol,m Datril, Anacin-3 and Panadol. It is not blood thinner. The products listed below are.  Do not take any of the products listed below in addition to any listed on your instruction sheet.  A.P.C or A.P.C with Codeine Codeine Phosphate Capsules #3 Ibuprofen Ridaura  ABC compound Congesprin Imuran rimadil  Advil Cope Indocin Robaxisal  Alka-Seltzer Effervescent Pain Reliever and Antacid Coricidin or Coricidin-D  Indomethacin Rufen  Alka-Seltzer plus Cold Medicine Cosprin Ketoprofen S-A-C Tablets  Anacin Analgesic Tablets or Capsules Coumadin Korlgesic Salflex  Anacin Extra Strength Analgesic tablets or capsules CP-2 Tablets Lanoril Salicylate  Anaprox Cuprimine Capsules Levenox Salocol  Anexsia-D Dalteparin Magan Salsalate  Anodynos Darvon compound Magnesium Salicylate Sine-off  Ansaid Dasin Capsules Magsal Sodium Salicylate  Anturane Depen Capsules Marnal Soma  APF Arthritis pain formula Dewitt's Pills Measurin Stanback  Argesic Dia-Gesic Meclofenamic Sulfinpyrazone  Arthritis Bayer Timed Release Aspirin Diclofenac Meclomen Sulindac  Arthritis pain formula Anacin Dicumarol Medipren Supac  Analgesic (Safety coated) Arthralgen Diffunasal Mefanamic Suprofen  Arthritis Strength Bufferin Dihydrocodeine Mepro Compound Suprol  Arthropan liquid Dopirydamole Methcarbomol with Aspirin Synalgos  ASA tablets/Enseals Disalcid Micrainin Tagament  Ascriptin Doan's Midol Talwin  Ascriptin A/D Dolene  Mobidin Tanderil  Ascriptin Extra Strength Dolobid Moblgesic Ticlid  Ascriptin with Codeine Doloprin or Doloprin with Codeine Momentum Tolectin  Asperbuf Duoprin Mono-gesic Trendar  Aspergum Duradyne Motrin or Motrin IB Triminicin  Aspirin plain, buffered or enteric coated Durasal Myochrisine Trigesic  Aspirin Suppositories Easprin Nalfon Trillsate  Aspirin with Codeine Ecotrin Regular or Extra Strength Naprosyn Uracel  Atromid-S Efficin Naproxen Ursinus  Auranofin Capsules Elmiron Neocylate Vanquish  Axotal Emagrin Norgesic Verin  Azathioprine Empirin or Empirin with Codeine Normiflo Vitamin E  Azolid Emprazil Nuprin  Voltaren  Bayer Aspirin plain, buffered or children's or timed BC Tablets or powders Encaprin Orgaran Warfarin Sodium  Buff-a-Comp Enoxaparin Orudis Zorpin  Buff-a-Comp with Codeine Equegesic Os-Cal-Gesic   Buffaprin Excedrin plain, buffered or Extra Strength Oxalid   Bufferin Arthritis Strength Feldene Oxphenbutazone   Bufferin plain or Extra Strength Feldene Capsules Oxycodone with Aspirin   Bufferin with Codeine Fenoprofen Fenoprofen Pabalate or Pabalate-SF   Buffets II Flogesic Panagesic   Buffinol plain or Extra Strength Florinal or Florinal with Codeine Panwarfarin   Buf-Tabs Flurbiprofen Penicillamine   Butalbital Compound Four-way cold tablets Penicillin   Butazolidin Fragmin Pepto-Bismol   Carbenicillin Geminisyn Percodan   Carna Arthritis Reliever Geopen Persantine   Carprofen Gold's salt Persistin   Chloramphenicol Goody's Phenylbutazone   Chloromycetin Haltrain Piroxlcam   Clmetidine heparin Plaquenil   Cllnoril Hyco-pap Ponstel   Clofibrate Hydroxy chloroquine Propoxyphen         Before stopping any of these medications, be sure to consult the physician who ordered them.  Some, such as Coumadin (Warfarin) are ordered to prevent or treat serious conditions such as "deep thrombosis", "pumonary embolisms", and other heart problems.  The amount of time that  you may need off of the medication may also vary with the medication and the reason for which you were taking it.  If you are taking any of these medications, please make sure you notify your pain physician before you undergo any procedures.         Trigger Point Injection Trigger points are areas where you have muscle pain. A trigger point injection is a shot given in the trigger point to relieve that pain. A trigger point might feel like a knot in your muscle. It hurts to press on a trigger point. Sometimes the pain spreads out (radiates) to other parts of the body. For example, pressing on a trigger point in your shoulder might cause pain in your arm or neck. You might have one trigger point. Or, you might have more than one. People often have trigger points in their upper back and lower back. They also occur often in the neck and shoulders. Pain from a trigger point lasts for a long time. It can make it hard to keep moving. You might not be able to do the exercise or physical therapy that could help you deal with the pain. A trigger point injection may help. It does not work for everyone. But, it may relieve your pain for a few days or a few months. A trigger point injection does not cure long-lasting (chronic) pain. LET YOUR CAREGIVER KNOW ABOUT:  Any allergies (especially to latex, lidocaine, or steroids).  Blood-thinning medicines that you take. These drugs can lead to bleeding or bruising after an injection. They include:  Aspirin.  Ibuprofen.  Clopidogrel.  Warfarin.  Other medicines you take. This includes all vitamins, herbs, eyedrops, over-the-counter medicines, and creams.  Use of steroids.  Recent infections.  Past problems with numbing medicines.  Bleeding problems.  Surgeries you have had.  Other health problems. RISKS AND COMPLICATIONS A trigger point injection is a safe treatment. However, problems may develop, such as:  Minor side effects usually go away  in 1 to 2 days. These may include:  Soreness.  Bruising.  Stiffness.  More serious problems are rare. But, they may include:  Bleeding under the skin (hematoma).  Skin infection.  Breaking off of the needle under your skin.  Lung puncture.  The trigger point injection may not work for  you. BEFORE THE PROCEDURE You may need to stop taking any medicine that thins your blood. This is to prevent bleeding and bruising. Usually these medicines are stopped several days before the injection. No other preparation is needed. PROCEDURE  A trigger point injection can be given in your caregiver's office or in a clinic. Each injection takes 2 minutes or less.  Your caregiver will feel for trigger points. The caregiver may use a marker to circle the area for the injection.  The skin over the trigger point will be washed with a germ-killing (antiseptic) solution.  The caregiver pinches the spot for the injection.  Then, a very thin needle is used for the shot. You may feel pain or a twitching feeling when the needle enters the trigger point.  A numbing solution may be injected into the trigger point. Sometimes a drug to keep down swelling, redness, and warmth (inflammation) is also injected.  Your caregiver moves the needle around the trigger zone until the tightness and twitching goes away.  After the injection, your caregiver may put gentle pressure over the injection site.  Then it is covered with a bandage. AFTER THE PROCEDURE  You can go right home after the injection.  The bandage can be taken off after a few hours.  You may feel sore and stiff for 1 to 2 days.  Go back to your regular activities slowly. Your caregiver may ask you to stretch your muscles. Do not do anything that takes extra energy for a few days.  Follow your caregiver's instructions to manage and treat other pain.   This information is not intended to replace advice given to you by your health care  provider. Make sure you discuss any questions you have with your health care provider.   Document Released: 02/20/2011 Document Revised: 06/28/2012 Document Reviewed: 02/20/2011 Elsevier Interactive Patient Education 2016 [employer]. Constipation, Adult Constipation is when a person has fewer than three bowel movements a week, has difficulty having a bowel movement, or has stools that are dry, hard, or larger than normal. As people grow older, constipation is more common. A low-fiber diet, not taking in enough fluids, and taking certain medicines may make constipation worse.  CAUSES   Certain medicines, such as antidepressants, pain medicine, iron supplements, antacids, and water pills.   Certain diseases, such as diabetes, irritable bowel syndrome (IBS), thyroid disease, or depression.   Not drinking enough water.   Not eating enough fiber-rich foods.   Stress or travel.   Lack of physical activity or exercise.   Ignoring the urge to have a bowel movement.   Using laxatives too much.  SIGNS AND SYMPTOMS   Having fewer than three bowel movements a week.   Straining to have a bowel movement.   Having stools that are hard, dry, or larger than normal.   Feeling full or bloated.   Pain in the lower abdomen.   Not feeling relief after having a bowel movement.  DIAGNOSIS  Your health care provider will take a medical history and perform a physical exam. Further testing may be done for severe constipation. Some tests may include:  A barium enema X-ray to examine your rectum, colon, and, sometimes, your small intestine.   A sigmoidoscopy to examine your lower colon.   A colonoscopy to examine your entire colon. TREATMENT  Treatment will depend on the severity of your constipation and what is causing it. Some dietary treatments include drinking more fluids and eating more fiber-rich foods. Lifestyle  treatments may include regular exercise. If these diet and  lifestyle recommendations do not help, your health care provider may recommend taking over-the-counter laxative medicines to help you have bowel movements. Prescription medicines may be prescribed if over-the-counter medicines do not work.  HOME CARE INSTRUCTIONS   Eat foods that have a lot of fiber, such as fruits, vegetables, whole grains, and beans.  Limit foods high in fat and processed sugars, such as french fries, hamburgers, cookies, candies, and soda.   A fiber supplement may be added to your diet if you cannot get enough fiber from foods.   Drink enough fluids to keep your urine clear or pale yellow.   Exercise regularly or as directed by your health care provider.   Go to the restroom when you have the urge to go. Do not hold it.   Only take over-the-counter or prescription medicines as directed by your health care provider. Do not take other medicines for constipation without talking to your health care provider first.  Blair IF:   You have bright red blood in your stool.   Your constipation lasts for more than 4 days or gets worse.   You have abdominal or rectal pain.   You have thin, pencil-like stools.   You have unexplained weight loss. MAKE SURE YOU:   Understand these instructions.  Will watch your condition.  Will get help right away if you are not doing well or get worse.   This information is not intended to replace advice given to you by your health care provider. Make sure you discuss any questions you have with your health care provider.   Document Released: 11/30/2003 Document Revised: 03/24/2014 Document Reviewed: 12/13/2012 Elsevier Interactive Patient Education [employer].

## 2015-04-25 ENCOUNTER — Ambulatory Visit: Payer: BC Managed Care – PPO | Attending: Pain Medicine | Admitting: Pain Medicine

## 2015-04-25 ENCOUNTER — Encounter: Payer: Self-pay | Admitting: Pain Medicine

## 2015-04-25 VITALS — BP 140/57 | HR 75 | Temp 97.9°F | Resp 16 | Ht 65.0 in | Wt 160.0 lb

## 2015-04-25 DIAGNOSIS — M25512 Pain in left shoulder: Secondary | ICD-10-CM | POA: Diagnosis present

## 2015-04-25 DIAGNOSIS — M159 Polyosteoarthritis, unspecified: Secondary | ICD-10-CM

## 2015-04-25 DIAGNOSIS — M170 Bilateral primary osteoarthritis of knee: Secondary | ICD-10-CM

## 2015-04-25 DIAGNOSIS — M5136 Other intervertebral disc degeneration, lumbar region: Secondary | ICD-10-CM

## 2015-04-25 DIAGNOSIS — M19012 Primary osteoarthritis, left shoulder: Secondary | ICD-10-CM | POA: Diagnosis not present

## 2015-04-25 DIAGNOSIS — M160 Bilateral primary osteoarthritis of hip: Secondary | ICD-10-CM

## 2015-04-25 DIAGNOSIS — M5135 Other intervertebral disc degeneration, thoracolumbar region: Secondary | ICD-10-CM

## 2015-04-25 DIAGNOSIS — M150 Primary generalized (osteo)arthritis: Secondary | ICD-10-CM

## 2015-04-25 DIAGNOSIS — M47816 Spondylosis without myelopathy or radiculopathy, lumbar region: Secondary | ICD-10-CM

## 2015-04-25 DIAGNOSIS — M19011 Primary osteoarthritis, right shoulder: Secondary | ICD-10-CM

## 2015-04-25 MED ORDER — ORPHENADRINE CITRATE 30 MG/ML IJ SOLN: 60.0000 mg | Freq: Once | INTRAMUSCULAR | Status: DC

## 2015-04-25 MED ORDER — SODIUM CHLORIDE 0.9% FLUSH: 20.0000 mL | Freq: Once | INTRAVENOUS | Status: DC

## 2015-04-25 MED ORDER — BUPIVACAINE HCL (PF) 0.25 % IJ SOLN: 30.0000 mL | Freq: Once | INTRAMUSCULAR | Status: DC

## 2015-04-25 MED ORDER — BUPIVACAINE HCL (PF) 0.25 % IJ SOLN
INTRAMUSCULAR | Status: AC
  Administered 2015-04-25: 10:00:00
  Filled 2015-04-25: qty 30

## 2015-04-25 MED ORDER — TRIAMCINOLONE ACETONIDE 40 MG/ML IJ SUSP
INTRAMUSCULAR | Status: AC
  Administered 2015-04-25: 10:00:00
  Filled 2015-04-25: qty 1

## 2015-04-25 MED ORDER — TRIAMCINOLONE ACETONIDE 40 MG/ML IJ SUSP: 40.0000 mg | Freq: Once | INTRAMUSCULAR | Status: DC

## 2015-04-25 MED ORDER — SODIUM CHLORIDE 0.9 % IJ SOLN
INTRAMUSCULAR | Status: AC
  Administered 2015-04-25: 10:00:00
  Filled 2015-04-25: qty 10

## 2015-04-25 NOTE — Progress Notes (Signed)
   Subjective:    Patient ID: Katrina Simon, female    DOB: 1950/07/20, 65 y.o.   MRN: [id number]  HPI                                             LEFT SHOULDER INJECTION   Patient is 65 year old female who returns to pain management center for further evaluation and treatment of pain involving the left shoulder. The patient has history of degenerative joint disease of the shoulder and decision has been made to proceed with shoulder injection in attempt to decrease severity of symptoms, minimize progression of symptoms, and avoid the need for more involved treatment. The risks benefits and expectations of procedure have been discussed and explained to patient who is with understanding and wished to proceed with procedure as planned.   Description Of Procedure:  Left Shoulder Injection  The patient assumed the sitting position and Betadine prep of proposed entry site was performed after identification of landmarks for left shoulder injection were identified. EKG, blood pressure, pulse, and pulse oximetry monitors were all in place.  Left Shoulder Injection (Anterior Approach) Following identification of landmarks for left shoulder injection, a 22-gauge needle was inserted and 2 cc of 0.25% bupivacaine with Kenalog was injected for left shoulder injection anterior approach.  Left Shoulder Injection (Posterior Approach) Following identification of landmarks for left shoulder injection, a 22-gauge needle was inserted and 2 cc of 0.25% bupivacaine with Kenalog was injected for left shoulder injection posterior approach.  Myoneural block injections of the trapezius musculature region Following Betadine prep of proposed entry site a 22-gauge needle was inserted into trapezius musculature region and following negative aspiration 2 cc of 0.25% bupivacaine with Norflex was injected for myoneural block injection 4 of the trapezius musculature region.  The patient tolerated procedure well  A total of  40 mg of Kenalog was utilized for the procedure     PLAN   Continue present medication OxyContin and oxycodo  F/U PCP Dr. Caryn Section for evaliation of  BP and general medical  condition  F/U surgical evaluation. May consider pending follow-up evaluations  Ask the nurses and secretary the date of MRI of the left shoulder  F/U neurological evaluation. May consider pending follow-up evaluations  Minimize activity as discussed and avoid activities which appear to aggravate shoulder, neck, and upper extremity   May consider radiofrequency rhizolysis or intraspinal procedures pending response to present treatment and F/U evaluation   Patient to call Pain Management Center should patient have concerns prior to scheduled return appointment        Review of Systems     Objective:   Physical Exam        Assessment & Plan:

## 2015-04-25 NOTE — Progress Notes (Signed)
Safety precautions to be maintained throughout the outpatient stay will include: orient to surroundings, keep bed in low position, maintain call bell within reach at all times, provide assistance with transfer out of bed and ambulation.  

## 2015-04-25 NOTE — Patient Instructions (Addendum)
PLAN   Continue present medication OxyContin and oxycodo  F/U PCP Dr. Caryn Section for evaliation of  BP and general medical  condition  F/U surgical evaluation. May consider pending follow-up evaluations  Ask the nurses and secretary the date of MRI of the left shoulder  F/U neurological evaluation. May consider pending follow-up evaluations  May consider radiofrequency rhizolysis or intraspinal procedures pending response to present treatment and F/U evaluation   Patient to call Pain Management Center should patient have concerns prior to scheduled return appointmentTrigger Point Injection Trigger points are areas where you have muscle pain. A trigger point injection is a shot given in the trigger point to relieve that pain. A trigger point might feel like a knot in your muscle. It hurts to press on a trigger point. Sometimes the pain spreads out (radiates) to other parts of the body. For example, pressing on a trigger point in your shoulder might cause pain in your arm or neck. You might have one trigger point. Or, you might have more than one. People often have trigger points in their upper back and lower back. They also occur often in the neck and shoulders. Pain from a trigger point lasts for a long time. It can make it hard to keep moving. You might not be able to do the exercise or physical therapy that could help you deal with the pain. A trigger point injection may help. It does not work for everyone. But, it may relieve your pain for a few days or a few months. A trigger point injection does not cure long-lasting (chronic) pain. LET YOUR CAREGIVER KNOW ABOUT:  Any allergies (especially to latex, lidocaine, or steroids).  Blood-thinning medicines that you take. These drugs can lead to bleeding or bruising after an injection. They include:  Aspirin.  Ibuprofen.  Clopidogrel.  Warfarin.  Other medicines you take. This includes all vitamins, herbs, eyedrops, over-the-counter  medicines, and creams.  Use of steroids.  Recent infections.  Past problems with numbing medicines.  Bleeding problems.  Surgeries you have had.  Other health problems. RISKS AND COMPLICATIONS A trigger point injection is a safe treatment. However, problems may develop, such as:  Minor side effects usually go away in 1 to 2 days. These may include:  Soreness.  Bruising.  Stiffness.  More serious problems are rare. But, they may include:  Bleeding under the skin (hematoma).  Skin infection.  Breaking off of the needle under your skin.  Lung puncture.  The trigger point injection may not work for you. BEFORE THE PROCEDURE You may need to stop taking any medicine that thins your blood. This is to prevent bleeding and bruising. Usually these medicines are stopped several days before the injection. No other preparation is needed. PROCEDURE  A trigger point injection can be given in your caregiver's office or in a clinic. Each injection takes 2 minutes or less.  Your caregiver will feel for trigger points. The caregiver may use a marker to circle the area for the injection.  The skin over the trigger point will be washed with a germ-killing (antiseptic) solution.  The caregiver pinches the spot for the injection.  Then, a very thin needle is used for the shot. You may feel pain or a twitching feeling when the needle enters the trigger point.  A numbing solution may be injected into the trigger point. Sometimes a drug to keep down swelling, redness, and warmth (inflammation) is also injected.  Your caregiver moves the needle around the trigger  zone until the tightness and twitching goes away.  After the injection, your caregiver may put gentle pressure over the injection site.  Then it is covered with a bandage. AFTER THE PROCEDURE  You can go right home after the injection.  The bandage can be taken off after a few hours.  You may feel sore and stiff for 1 to 2  days.  Go back to your regular activities slowly. Your caregiver may ask you to stretch your muscles. Do not do anything that takes extra energy for a few days.  Follow your caregiver's instructions to manage and treat other pain.   This information is not intended to replace advice given to you by your health care provider. Make sure you discuss any questions you have with your health care provider.   Document Released: 02/20/2011 Document Revised: 06/28/2012 Document Reviewed: 02/20/2011 Else[employer] Interactive Patient Education 2016 [employer] Pain Management Discharge Instructions  General Discharge Instructions :  If you need to reach your doctor call: Monday-Friday 8:00 am - 4:00 pm at [phone] or toll free [phone].  After clinic hours [phone] to have operator reach doctor.  Bring all of your medication bottles to all your appointments in the pain clinic.  To cancel or reschedule your appointment with Pain Management please remember to call 24 hours in advance to avoid a fee.  Refer to the educational materials which you have been given on: General Risks, I had my Procedure. Discharge Instructions, Post Sedation.  Post Procedure Instructions:  The drugs you were given will stay in your system until tomorrow, so for the next 24 hours you should not drive, make any legal decisions or drink any alcoholic beverages.  You may eat anything you prefer, but it is better to start with liquids then soups and crackers, and gradually work up to solid foods.  Please notify your doctor immediately if you have any unusual bleeding, trouble breathing or pain that is not related to your normal pain.  Depending on the type of procedure that was done, some parts of your body may feel week and/or numb.  This usually clears up by tonight or the next day.  Walk with the use of an assistive device or accompanied by an adult for the 24 hours.  You may use ice on the affected area  for the first 24 hours.  Put ice in a Ziploc bag and cover with a towel and place against area 15 minutes on 15 minutes off.  You may switch to heat after 24 hours.

## 2015-04-26 ENCOUNTER — Telehealth: Payer: Self-pay | Admitting: *Deleted

## 2015-04-26 NOTE — Telephone Encounter (Signed)
No answer post procedure phone call 

## 2015-05-16 ENCOUNTER — Encounter: Payer: Self-pay | Admitting: Pain Medicine

## 2015-05-16 ENCOUNTER — Ambulatory Visit: Payer: BC Managed Care – PPO | Attending: Pain Medicine | Admitting: Pain Medicine

## 2015-05-16 VITALS — BP 117/55 | HR 69 | Temp 97.2°F | Resp 15 | Ht 65.0 in | Wt 166.0 lb

## 2015-05-16 DIAGNOSIS — M47816 Spondylosis without myelopathy or radiculopathy, lumbar region: Secondary | ICD-10-CM | POA: Insufficient documentation

## 2015-05-16 DIAGNOSIS — M159 Polyosteoarthritis, unspecified: Secondary | ICD-10-CM

## 2015-05-16 DIAGNOSIS — M5030 Other cervical disc degeneration, unspecified cervical region: Secondary | ICD-10-CM | POA: Diagnosis not present

## 2015-05-16 DIAGNOSIS — M19011 Primary osteoarthritis, right shoulder: Secondary | ICD-10-CM

## 2015-05-16 DIAGNOSIS — M533 Sacrococcygeal disorders, not elsewhere classified: Secondary | ICD-10-CM | POA: Diagnosis not present

## 2015-05-16 DIAGNOSIS — M19012 Primary osteoarthritis, left shoulder: Secondary | ICD-10-CM | POA: Diagnosis not present

## 2015-05-16 DIAGNOSIS — M25511 Pain in right shoulder: Secondary | ICD-10-CM | POA: Diagnosis present

## 2015-05-16 DIAGNOSIS — M170 Bilateral primary osteoarthritis of knee: Secondary | ICD-10-CM

## 2015-05-16 DIAGNOSIS — M5135 Other intervertebral disc degeneration, thoracolumbar region: Secondary | ICD-10-CM

## 2015-05-16 DIAGNOSIS — M150 Primary generalized (osteo)arthritis: Secondary | ICD-10-CM

## 2015-05-16 DIAGNOSIS — M5136 Other intervertebral disc degeneration, lumbar region: Secondary | ICD-10-CM | POA: Diagnosis not present

## 2015-05-16 DIAGNOSIS — M25512 Pain in left shoulder: Secondary | ICD-10-CM | POA: Diagnosis present

## 2015-05-16 DIAGNOSIS — M160 Bilateral primary osteoarthritis of hip: Secondary | ICD-10-CM

## 2015-05-16 MED ORDER — OXYCODONE HCL 5 MG PO TABS
ORAL_TABLET | ORAL | Status: DC
Start: 2015-05-16 — End: 2015-06-14

## 2015-05-16 MED ORDER — OXYCODONE HCL ER 10 MG PO T12A: EXTENDED_RELEASE_TABLET | ORAL | Status: DC

## 2015-05-16 NOTE — Patient Instructions (Signed)
PLAN   Continue present medication OxyContin and oxycod  F/U PCP Dr. Caryn Section for evaliation of  BP and general medical  condition  F/U surgical evaluation. May consider pending follow-up evaluations  Ask the nurses and secretary the date of MRI of the left shoulder  F/U neurological evaluation. May consider pending follow-up evaluations  May consider radiofrequency rhizolysis or intraspinal procedures pending response to present treatment and F/U evaluation   Patient to call Pain Management Center should patient have concerns prior to scheduled return appointment

## 2015-05-16 NOTE — Progress Notes (Signed)
Safety precautions to be maintained throughout the outpatient stay will include: orient to surroundings, keep bed in low position, maintain call bell within reach at all times, provide assistance with transfer out of bed and ambulation.  

## 2015-05-16 NOTE — Progress Notes (Signed)
   Subjective:    Patient ID: Katrina Simon, female    DOB: 1950/12/07, 65 y.o.   MRN: [id number]  HPI  The patient is a 65 year old female who returns to pain management for further evaluation and treatment of pain involving shoulders as well as the lower back lower extremity region and knees. The patient states that she had wonderful relief of pain following shoulder injection performed at time of previous visits to pain management Center. The patient states that she is able to perform activities of daily living including reaching and lifting pushing pulling without any significant pain or limited range of motion. The patient denies any trauma change in events of daily living the call significant change in symptomatology. The patient states the pain involving the lower back lower extremity region and knees appears to be fairly well-controlled at this time. We will continue patient's medications at this time and will consider patient for interventional treatment as well as modification of other aspects of patient's treatment regimen pending follow-up evaluation. The patient was with understanding and in agreement with suggested treatment plan.  Review of Systems     Objective:   Physical Exam  There was tenderness to palpation of the splenius capitis and occipitalis musculature region a mild degree with mild tenderness of the cervical facet cervical paraspinal musculature region. Palpation of the acromioclavicular and glenohumeral joint regions reproduces minimal discomfort. Patient was able to perform drop test without difficulty. There was minimal tenderness over the thoracic facet thoracic paraspinal musculature region with no crepitus of the thoracic region noted. Tinel and Phalen's maneuver were without increase of pain of significant degree. The patient appeared to be with bilaterally equal grip strength. Palpation over the upper and mid thoracic regions reproduces minimal discomfort. Palpation  of the lower thoracic paraspinal musculature region was with mild to moderate discomfort. Lateral bending rotation extension and palpation of the lumbar facets reproduce mild to moderate discomfort. Palpation over the PSIS and PII S region reproduced mild to moderate discomfort. There was mild tenderness of the gluteal and piriformis musculature regions. Palpation of the greater trochanteric region and iliotibial band regions reproduces minimal discomfort. There was no increased pain with pressure applied to the ileum with patient in lateral decubitus position. Straight leg raise was tolerates approximately 30 without increase of pain with dorsiflexion noted. The knees were with crepitus of the knees with mild tenderness to palpation without ballottement of the patella. EHL strength appeared to be slightly decreased. There was negative clonus negative Homans. There was no sensory deficit of the lower extremities dermatomal distribution detected. Abdomen was nontender with no costovertebral tenderness noted.      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 Caryn Section for evaliation of  BP and general medical  condition  F/U surgical evaluation. May consider pending follow-up evaluations  F/U neurological evaluation. May consider pending follow-up evaluations  May consider radiofrequency rhizolysis or intraspinal procedures pending response to present treatment and F/U evaluation   Patient to call Pain Management Center should patient have concerns prior to scheduled return appointment

## 2015-05-29 ENCOUNTER — Other Ambulatory Visit: Payer: Self-pay | Admitting: Pain Medicine

## 2015-06-13 ENCOUNTER — Other Ambulatory Visit: Payer: Self-pay | Admitting: Pain Medicine

## 2015-06-14 ENCOUNTER — Encounter: Payer: Self-pay | Admitting: Pain Medicine

## 2015-06-14 ENCOUNTER — Ambulatory Visit: Payer: BC Managed Care – PPO | Attending: Pain Medicine | Admitting: Pain Medicine

## 2015-06-14 VITALS — BP 128/82 | HR 76 | Temp 97.8°F | Resp 16 | Ht 65.0 in | Wt 168.0 lb

## 2015-06-14 DIAGNOSIS — M47816 Spondylosis without myelopathy or radiculopathy, lumbar region: Secondary | ICD-10-CM | POA: Diagnosis not present

## 2015-06-14 DIAGNOSIS — M25511 Pain in right shoulder: Secondary | ICD-10-CM | POA: Diagnosis present

## 2015-06-14 DIAGNOSIS — M533 Sacrococcygeal disorders, not elsewhere classified: Secondary | ICD-10-CM | POA: Insufficient documentation

## 2015-06-14 DIAGNOSIS — M5136 Other intervertebral disc degeneration, lumbar region: Secondary | ICD-10-CM | POA: Diagnosis not present

## 2015-06-14 DIAGNOSIS — M160 Bilateral primary osteoarthritis of hip: Secondary | ICD-10-CM

## 2015-06-14 DIAGNOSIS — M19012 Primary osteoarthritis, left shoulder: Secondary | ICD-10-CM | POA: Insufficient documentation

## 2015-06-14 DIAGNOSIS — M19011 Primary osteoarthritis, right shoulder: Secondary | ICD-10-CM | POA: Insufficient documentation

## 2015-06-14 DIAGNOSIS — M5030 Other cervical disc degeneration, unspecified cervical region: Secondary | ICD-10-CM | POA: Insufficient documentation

## 2015-06-14 DIAGNOSIS — M25512 Pain in left shoulder: Secondary | ICD-10-CM | POA: Diagnosis present

## 2015-06-14 DIAGNOSIS — M150 Primary generalized (osteo)arthritis: Secondary | ICD-10-CM

## 2015-06-14 DIAGNOSIS — M5135 Other intervertebral disc degeneration, thoracolumbar region: Secondary | ICD-10-CM

## 2015-06-14 DIAGNOSIS — M159 Polyosteoarthritis, unspecified: Secondary | ICD-10-CM

## 2015-06-14 DIAGNOSIS — M170 Bilateral primary osteoarthritis of knee: Secondary | ICD-10-CM

## 2015-06-14 MED ORDER — OXYCODONE HCL 5 MG PO TABS: ORAL_TABLET | ORAL | Status: DC

## 2015-06-14 MED ORDER — OXYCODONE HCL ER 10 MG PO T12A: EXTENDED_RELEASE_TABLET | ORAL | Status: DC

## 2015-06-14 NOTE — Progress Notes (Signed)
Safety precautions to be maintained throughout the outpatient stay will include: orient to surroundings, keep bed in low position, maintain call bell within reach at all times, provide assistance with transfer out of bed and ambulation.  

## 2015-06-14 NOTE — Patient Instructions (Signed)
PLAN   Continue present medication OxyContin and oxycodone  F/U PCP Dr. Caryn Section for evaliation of  BP and general medical  condition  F/U surgical evaluation. May consider pending follow-up evaluations  Ask the nurses and secretary the date of MRI of the left shoulder  F/U neurological evaluation. May consider pending follow-up evaluations  May consider radiofrequency rhizolysis or intraspinal procedures pending response to present treatment and F/U evaluation   Patient to call Pain Management Center should patient have concerns prior to scheduled return appointment

## 2015-06-14 NOTE — Progress Notes (Signed)
   Subjective:    Patient ID: Katrina Simon, female    DOB: 1950/07/16, 65 y.o.   MRN: [id number]  HPI  The patient is a 65 year old female who returns to pain management for further evaluation and treatment of pain involving shoulders and Back upper and lower extremity regions. The patient states that she is doing remarkably well at this time and is looking forward to riding her horse. The patient denied any trauma or change in events of daily living the call significant change in symptomatology. The patient is tolerating medications consisting of OxyContin and oxycodone without any undesirable side effects. The patient is able to perform most activities of daily living as well as obtaining restful sleep without experiencing any significant pain. We will continue present medications as prescribed and we'll remain available to consider patient for modifications of treatment regimen as discussed. All agreed to suggested treatment plan.  Review of Systems     Objective:   Physical Exam   There was minimal tenderness of the splenius capitis and a separate talus musculature regions. Palpation of the cervical facet cervical paraspinal musculature region was with minimal discomfort. There was mild tenderness of the acromioclavicular and glenohumeral joint regions of the left and right shoulders. The patient was able to perform drop test without significant difficulty. The patient appeared to be with bilaterally equal grip strength and Tinel and Phalen's maneuver were without increase of pain of significant degree. Palpation of the thoracic region was attends to palpation of minimal degree with no crepitus of the thoracic region noted. Palpation over the lumbar paraspinal musculatures and lumbar facet region was attends to palpation of mild to moderate degree with lateral bending rotation extension and palpation of the lumbar facets reproducing mild to moderate discomfort. Straight leg raise was tolerated to  30 without increase of pain with dorsiflexion noted. There was minimal tenderness of the PSIS and PII S region as well as the gluteal and piriformis musculature region. There was minimal tenderness of the greater trochanteric region and iliotibial band region. The knees were with crepitus with negative anterior and posterior drawer signs without ballottement of patella. There was negative anterior as well as negative posterior drawer signs. EHL strength appeared to be slightly decreased. No sensory deficit or dermatomal distribution detected. There was negative clonus negative Homans. 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 Caryn Section for evaliation of  BP and general medical  condition  F/U surgical evaluation. May consider pending follow-up evaluations  Ask the nurses and secretary the date of MRI of the left shoulder  F/U neurological evaluation. May consider pending follow-up evaluations  May consider radiofrequency rhizolysis or intraspinal procedures pending response to present treatment and F/U evaluation   Patient to call Pain Management Center should patient have concerns prior to scheduled return appointment

## 2015-07-12 ENCOUNTER — Encounter: Payer: Self-pay | Admitting: Pain Medicine

## 2015-07-12 ENCOUNTER — Ambulatory Visit: Payer: BC Managed Care – PPO | Attending: Pain Medicine | Admitting: Pain Medicine

## 2015-07-12 VITALS — BP 119/72 | HR 74 | Temp 98.2°F | Resp 16 | Ht 65.0 in | Wt 166.0 lb

## 2015-07-12 DIAGNOSIS — M25512 Pain in left shoulder: Secondary | ICD-10-CM | POA: Diagnosis present

## 2015-07-12 DIAGNOSIS — M5136 Other intervertebral disc degeneration, lumbar region: Secondary | ICD-10-CM

## 2015-07-12 DIAGNOSIS — M170 Bilateral primary osteoarthritis of knee: Secondary | ICD-10-CM

## 2015-07-12 DIAGNOSIS — M5030 Other cervical disc degeneration, unspecified cervical region: Secondary | ICD-10-CM | POA: Diagnosis not present

## 2015-07-12 DIAGNOSIS — M19012 Primary osteoarthritis, left shoulder: Secondary | ICD-10-CM | POA: Diagnosis not present

## 2015-07-12 DIAGNOSIS — M47816 Spondylosis without myelopathy or radiculopathy, lumbar region: Secondary | ICD-10-CM | POA: Diagnosis not present

## 2015-07-12 DIAGNOSIS — M25511 Pain in right shoulder: Secondary | ICD-10-CM | POA: Diagnosis present

## 2015-07-12 DIAGNOSIS — M19011 Primary osteoarthritis, right shoulder: Secondary | ICD-10-CM | POA: Insufficient documentation

## 2015-07-12 DIAGNOSIS — M533 Sacrococcygeal disorders, not elsewhere classified: Secondary | ICD-10-CM | POA: Insufficient documentation

## 2015-07-12 DIAGNOSIS — M150 Primary generalized (osteo)arthritis: Secondary | ICD-10-CM

## 2015-07-12 DIAGNOSIS — M5135 Other intervertebral disc degeneration, thoracolumbar region: Secondary | ICD-10-CM

## 2015-07-12 DIAGNOSIS — M159 Polyosteoarthritis, unspecified: Secondary | ICD-10-CM

## 2015-07-12 DIAGNOSIS — M160 Bilateral primary osteoarthritis of hip: Secondary | ICD-10-CM

## 2015-07-12 MED ORDER — OXYCODONE HCL ER 10 MG PO T12A: EXTENDED_RELEASE_TABLET | ORAL | Status: DC

## 2015-07-12 MED ORDER — OXYCODONE HCL 5 MG PO TABS: ORAL_TABLET | ORAL | Status: DC

## 2015-07-12 NOTE — Patient Instructions (Signed)
PLAN   Continue present medication OxyContin and oxycodone  F/U PCP Dr. Caryn Section for evaliation of  BP and general medical  condition  F/U surgical evaluation. May consider pending follow-up evaluations  Ask the nurses and secretary the date of MRI of the left shoulder  F/U neurological evaluation. May consider pending follow-up evaluations  May consider radiofrequency rhizolysis or intraspinal procedures pending response to present treatment and F/U evaluation   Patient to call Pain Management Center should patient have concerns prior to scheduled return appointment

## 2015-07-12 NOTE — Progress Notes (Signed)
Safety precautions to be maintained throughout the outpatient stay will include: orient to surroundings, keep bed in low position, maintain call bell within reach at all times, provide assistance with transfer out of bed and ambulation.  

## 2015-07-12 NOTE — Progress Notes (Signed)
   Subjective:    Patient ID: Katrina Simon, female    DOB: [birth year], 65 y.o.   MRN: [id number]  HPI  The patient is a 65 year old female who returns to pain management for further evaluation and treatment of pain involving the shoulders entire back upper and lower extremity regions. The patient also has had pain involving the knees. At the present time patient states that she is doing quite well and pain is well controlled. The patient states that she is looking forward to going horseback riding. The patient is with some pain with prolonged standing and walking which is fairly well-controlled with treatment regimen presently being implemented The patient is tolerating medications oxycodone and OxyContin with out any undesirable side effects. We will continue medications as prescribed and we'll remain available to consider modification of treatment regimen should they be change in condition. All agreed to suggested treatment plan.     Review of Systems     Objective:   Physical Exam  There was tenderness to palpation of the splenius capitis and occipitalis musculature regions of mild degree with mild tenderness of the cervical facet and thoracic facet region with mild tenderness over the region of the acromioclavicular and glenohumeral joint regions. There was what appeared to be unremarkable Spurling's maneuver. Tinel and Phalen's maneuver were without increase of pain of significant degree. Palpation over the thoracic region thoracic facet region was attends to palpation of moderate degree in the lower thoracic region with moderate muscle spasms of the lower thoracic region noted. Tenderness over the lumbar paraspinal musculatures and lumbar facet region a moderate degree with tenderness over the PSIS and PII S region reproducing mild to moderate discomfort. There was moderate tenderness to palpation of the greater trochanteric region and iliotibial band region. Patient was able to stand on  tiptoes and heels with minor difficulty. No sensory deficit or dermatomal distribution detected. Knees were without excessive tends to palpation. Crepitus of the knees were noted. There was negative anterior and posterior drawer signs without ballottement of the patella. 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 Caryn Section for evaliation of  BP and general medical  condition  F/U surgical evaluation. May consider pending follow-up evaluations  Ask the nurses and secretary the date of MRI of the left shoulder  F/U neurological evaluation. May consider pending follow-up evaluations  May consider radiofrequency rhizolysis or intraspinal procedures pending response to present treatment and F/U evaluation   Patient to call Pain Management Center should patient have concerns prior to scheduled return appointment

## 2015-08-09 ENCOUNTER — Encounter: Payer: Self-pay | Admitting: Pain Medicine

## 2015-08-09 ENCOUNTER — Ambulatory Visit: Payer: BC Managed Care – PPO | Attending: Pain Medicine | Admitting: Pain Medicine

## 2015-08-09 VITALS — BP 128/54 | HR 68 | Temp 98.1°F | Resp 16 | Ht 65.0 in | Wt 165.0 lb

## 2015-08-09 DIAGNOSIS — M160 Bilateral primary osteoarthritis of hip: Secondary | ICD-10-CM

## 2015-08-09 DIAGNOSIS — M51369 Other intervertebral disc degeneration, lumbar region without mention of lumbar back pain or lower extremity pain: Secondary | ICD-10-CM

## 2015-08-09 DIAGNOSIS — M5136 Other intervertebral disc degeneration, lumbar region: Secondary | ICD-10-CM

## 2015-08-09 DIAGNOSIS — M19011 Primary osteoarthritis, right shoulder: Secondary | ICD-10-CM | POA: Diagnosis not present

## 2015-08-09 DIAGNOSIS — M62830 Muscle spasm of back: Secondary | ICD-10-CM | POA: Diagnosis not present

## 2015-08-09 DIAGNOSIS — M47816 Spondylosis without myelopathy or radiculopathy, lumbar region: Secondary | ICD-10-CM

## 2015-08-09 DIAGNOSIS — M19012 Primary osteoarthritis, left shoulder: Secondary | ICD-10-CM | POA: Diagnosis not present

## 2015-08-09 DIAGNOSIS — M150 Primary generalized (osteo)arthritis: Secondary | ICD-10-CM

## 2015-08-09 DIAGNOSIS — M5030 Other cervical disc degeneration, unspecified cervical region: Secondary | ICD-10-CM | POA: Insufficient documentation

## 2015-08-09 DIAGNOSIS — M170 Bilateral primary osteoarthritis of knee: Secondary | ICD-10-CM

## 2015-08-09 DIAGNOSIS — M533 Sacrococcygeal disorders, not elsewhere classified: Secondary | ICD-10-CM | POA: Insufficient documentation

## 2015-08-09 DIAGNOSIS — M5135 Other intervertebral disc degeneration, thoracolumbar region: Secondary | ICD-10-CM

## 2015-08-09 DIAGNOSIS — M542 Cervicalgia: Secondary | ICD-10-CM | POA: Diagnosis present

## 2015-08-09 DIAGNOSIS — M159 Polyosteoarthritis, unspecified: Secondary | ICD-10-CM

## 2015-08-09 MED ORDER — OXYCODONE HCL 5 MG PO TABS: ORAL_TABLET | ORAL | Status: DC

## 2015-08-09 MED ORDER — OXYCODONE HCL ER 10 MG PO T12A: EXTENDED_RELEASE_TABLET | ORAL | Status: DC

## 2015-08-09 NOTE — Patient Instructions (Signed)
PLAN   Continue present medication OxyContin and oxycodone  F/U PCP Dr. Caryn Section for evaliation of  BP and general medical  condition  F/U surgical evaluation. May consider pending follow-up evaluations  Ask the nurses and secretary the date of MRI of the left shoulder  F/U neurological evaluation. May consider pending follow-up evaluations  May consider radiofrequency rhizolysis or intraspinal procedures pending response to present treatment and F/U evaluation   Patient to call Pain Management Center should patient have concerns prior to scheduled return appointment

## 2015-08-09 NOTE — Progress Notes (Signed)
Safety precautions to be maintained throughout the outpatient stay will include: orient to surroundings, keep bed in low position, maintain call bell within reach at all times, provide assistance with transfer out of bed and ambulation.  

## 2015-08-10 NOTE — Progress Notes (Signed)
   Subjective:    Patient ID: Katrina Simon, female    DOB: 1950/06/30, 65 y.o.   MRN: [id number]  HPI  The patient is a 65 year old female who returns to pain management for further evaluation and treatment of pain involving the neck of mild-to-moderate degree as well as the shoulders of moderate degree and lower back lower extremity pain of moderate degree to moderately severe degree with pain of the knees as well. The patient states that the present time the pain is fairly well-controlled. The patient is without trauma change in events of daily living the call significant change in symptomatology. The patient has been able to return to some of her activities that she enjoys including horseback riding. At the present time patient states that she will continue present treatment regimen and we'll call pain management should they be significant exacerbation of her symptoms. The patient states the pain of the knees is fairly well-controlled at this time. The patient states that the lower back lower extremity pain also appears to be fairly well controlled. We will continue patient's medications consisting of oxycodone and OxyContin and we'll remain available to consider modifications of treatment pending follow-up evaluation. All agreed to suggested treatment plan.  Review of Systems     Objective:   Physical Exam  Palpation of the splenius capitis and occipitalis musculature regions reproduced pain of mild-to-moderate degree with mild to moderate tenderness of the nuchal facet cervical paraspinal musculature region. Palpation of the acromioclavicular and glenohumeral joint regions reproduced pain of mild-to-moderate degree. There was unremarkable Spurling's maneuver. The patient was with what appeared to be bilaterally equal grip strength without significant increase of pain with Tinel and Phalen's maneuver.. Palpation of the thoracic region was attends to palpation with evidence of muscle spasm of the  lower thoracic paraspinal musculature region with no crepitus of the thoracic region noted. Palpation over the lumbar paraspinal musculatures and lumbar facet region was with moderate discomfort with lateral bending rotation extension and palpation of the lumbar facets reproducing moderate discomfort. There was tenderness of the knees and crepitus of the knees with anterior and posterior drawer signs being negative. No ballottement of the patella was noted. Straight leg raise was tolerates approximately 30 without increased pain with dorsiflexion noted. There was mild to moderate tenderness of the greater trochanteric region iliotibial band region. Acid dermatomal distribution detected. There was negative clonus negative Homans. Abdomen nontender with no costovertebral tenderness noted.      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 Caryn Section for evaliation of  BP and general medical  condition  F/U surgical evaluation. May consider pending follow-up evaluations  Ask the nurses and secretary the date of MRI of the left shoulder  F/U neurological evaluation. May consider pending follow-up evaluations  May consider radiofrequency rhizolysis or intraspinal procedures pending response to present treatment and F/U evaluation   Patient to call Pain Management Center should patient have concerns prior to scheduled return appointment

## 2015-09-06 ENCOUNTER — Encounter: Payer: Self-pay | Admitting: Pain Medicine

## 2015-09-06 ENCOUNTER — Ambulatory Visit: Payer: BC Managed Care – PPO | Attending: Pain Medicine | Admitting: Pain Medicine

## 2015-09-06 VITALS — BP 132/63 | HR 66 | Temp 98.4°F | Resp 15 | Ht 65.0 in | Wt 167.0 lb

## 2015-09-06 DIAGNOSIS — M5136 Other intervertebral disc degeneration, lumbar region: Secondary | ICD-10-CM | POA: Insufficient documentation

## 2015-09-06 DIAGNOSIS — M533 Sacrococcygeal disorders, not elsewhere classified: Secondary | ICD-10-CM | POA: Insufficient documentation

## 2015-09-06 DIAGNOSIS — M5135 Other intervertebral disc degeneration, thoracolumbar region: Secondary | ICD-10-CM

## 2015-09-06 DIAGNOSIS — M5030 Other cervical disc degeneration, unspecified cervical region: Secondary | ICD-10-CM | POA: Insufficient documentation

## 2015-09-06 DIAGNOSIS — M19011 Primary osteoarthritis, right shoulder: Secondary | ICD-10-CM | POA: Diagnosis not present

## 2015-09-06 DIAGNOSIS — M62830 Muscle spasm of back: Secondary | ICD-10-CM | POA: Insufficient documentation

## 2015-09-06 DIAGNOSIS — M19012 Primary osteoarthritis, left shoulder: Secondary | ICD-10-CM | POA: Diagnosis not present

## 2015-09-06 DIAGNOSIS — M170 Bilateral primary osteoarthritis of knee: Secondary | ICD-10-CM | POA: Diagnosis present

## 2015-09-06 DIAGNOSIS — M47816 Spondylosis without myelopathy or radiculopathy, lumbar region: Secondary | ICD-10-CM | POA: Diagnosis not present

## 2015-09-06 DIAGNOSIS — M160 Bilateral primary osteoarthritis of hip: Secondary | ICD-10-CM

## 2015-09-06 MED ORDER — OXYCODONE HCL 5 MG PO TABS: ORAL_TABLET | ORAL | Status: DC

## 2015-09-06 MED ORDER — OXYCODONE HCL ER 10 MG PO T12A: EXTENDED_RELEASE_TABLET | ORAL | Status: DC

## 2015-09-06 NOTE — Progress Notes (Signed)
Safety precautions to be maintained throughout the outpatient stay will include: orient to surroundings, keep bed in low position, maintain call bell within reach at all times, provide assistance with transfer out of bed and ambulation.  

## 2015-09-06 NOTE — Patient Instructions (Addendum)
PLAN   Continue present medication OxyContin and oxycodone  Genicular nerve blocks of knees to be performed at time of return appointment  F/U PCP Dr. Caryn Section for evaliation of  BP and general medical  condition  F/U surgical evaluation. May consider pending follow-up evaluations  Ask the nurses and secretary the date of MRI of the left shoulder  F/U neurological evaluation. May consider PNCV/EMG studies and other studies pending follow-up evaluations  May consider radiofrequency rhizolysis or intraspinal procedures pending response to present treatment and F/U evaluation   Patient to call [facility] Management Center should patient have concerns prior to scheduled return appointmentKnee Injection A knee injection is a procedure to get medicine into your knee joint. Your health care provider puts a needle into the joint and injects medicine with an attached syringe. The injected medicine may relieve the pain, swelling, and stiffness of arthritis. The injected medicine may also help to lubricate and cushion your knee joint. You may need more than one injection. LET [facility] CARE PROVIDER KNOW ABOUT:  Any allergies you have.  All medicines you are taking, including vitamins, herbs, eye drops, creams, and over-the-counter medicines.  Previous problems you or members of your family have had with the use of anesthetics.  Any blood disorders you have.  Previous surgeries you have had.  Any medical conditions you may have. RISKS AND COMPLICATIONS Generally, this is a safe procedure. However, problems may occur, including:  Infection.  Bleeding.  Worsening symptoms.  Damage to the area around your knee.  Allergic reaction to any of the medicines.  Skin reactions from repeated injections. BEFORE THE PROCEDURE  Ask your health care provider about changing or stopping your regular medicines. This is especially important if you are taking diabetes medicines or blood thinners.  Plan  to have someone take you home after the procedure. PROCEDURE  You will sit or lie down in a position for your knee to be treated.  The skin over your kneecap will be cleaned with a germ-killing solution (antiseptic).  You will be given a medicine that numbs the area (local anesthetic). You may feel some stinging.  After your knee becomes numb, you will have a second injection. This is the medicine. This needle is carefully placed between your kneecap and your knee. The medicine is injected into the joint space.  At the end of the procedure, the needle will be removed.  A bandage (dressing) may be placed over the injection site. The procedure may vary among health care providers and hospitals. AFTER THE PROCEDURE  You may have to move your knee through its full range of motion. This helps to get all of the medicine into your joint space.  Your blood pressure, heart rate, breathing rate, and blood oxygen level will be monitored often until the medicines you were given have worn off.  You will be watched to make sure that you do not have a reaction to the injected medicine.   This information is not intended to replace advice given to you by your health care provider. Make sure you discuss any questions you have with your health care provider.   Document Released: 05/25/2006 Document Revised: 03/24/2014 Document Reviewed: 01/11/2014 Elsevier Interactive Patient Education 2016 [place]  What are the risk, side effects and possible complications? Generally speaking, most procedures are safe.  However, with any procedure there are risks, side effects, and the possibility of complications.  The risks and complications are dependent upon the  sites that are lesioned, or the type of nerve block to be performed.  The closer the procedure is to the spine, the more serious the risks are.  Great care is taken when placing the radio frequency needles, block  needles or lesioning probes, but sometimes complications can occur.  Infection: Any time there is an injection through the skin, there is a risk of infection.  This is why sterile conditions are used for these blocks.  There are four possible types of infection.  Localized skin infection.  Central Nervous System Infection-This can be in the form of Meningitis, which can be deadly.  Epidural Infections-This can be in the form of an epidural abscess, which can cause pressure inside of the spine, causing compression of the spinal cord with subsequent paralysis. This would require an emergency surgery to decompress, and there are no guarantees that the patient would recover from the paralysis.  Discitis-This is an infection of the intervertebral discs.  It occurs in about 1% of discography procedures.  It is difficult to treat and it may lead to surgery.        2. Pain: the needles have to go through skin and soft tissues, will cause soreness.       3. Damage to internal structures:  The nerves to be lesioned may be near blood vessels or    other nerves which can be potentially damaged.       4. Bleeding: Bleeding is more common if the patient is taking blood thinners such as  aspirin, Coumadin, Ticiid, Plavix, etc., or if he/she have some genetic predisposition  such as hemophilia. Bleeding into the spinal canal can cause compression of the spinal  cord with subsequent paralysis.  This would require an emergency surgery to  decompress and there are no guarantees that the patient would recover from the  paralysis.       5. Pneumothorax:  Puncturing of a lung is a possibility, every time a needle is introduced in  the area of the chest or upper back.  Pneumothorax refers to free air around the  collapsed lung(s), inside of the thoracic cavity (chest cavity).  Another two possible  complications related to a similar event would include: Hemothorax and Chylothorax.   These are variations of the  Pneumothorax, where instead of air around the collapsed  lung(s), you may have blood or chyle, respectively.       6. Spinal headaches: They may occur with any procedures in the area of the spine.       7. Persistent CSF (Cerebro-Spinal Fluid) leakage: This is a rare problem, but may occur  with prolonged intrathecal or epidural catheters either due to the formation of a fistulous  track or a dural tear.       8. Nerve damage: By working so close to the spinal cord, there is always a possibility of  nerve damage, which could be as serious as a permanent spinal cord injury with  paralysis.       9. Death:  Although rare, severe deadly allergic reactions known as "Anaphylactic  reaction" can occur to any of the medications used.      10. Worsening of the symptoms:  We can always make thing worse.  What are the chances of something like this happening? Chances of any of this occuring are extremely low.  By statistics, you have more of a chance of getting killed in a motor vehicle accident: while driving to the hospital than any  of the above occurring .  Nevertheless, you should be aware that they are possibilities.  In general, it is similar to taking a shower.  Everybody knows that you can slip, hit your head and get killed.  Does that mean that you should not shower again?  Nevertheless always keep in mind that statistics do not mean anything if you happen to be on the wrong side of them.  Even if a procedure has a 1 (one) in a 1,000,000 (million) chance of going wrong, it you happen to be that one..Also, keep in mind that by statistics, you have more of a chance of having something go wrong when taking medications.  Who should not have this procedure? If you are on a blood thinning medication (e.g. Coumadin, Plavix, see list of "Blood Thinners"), or if you have an active infection going on, you should not have the procedure.  If you are taking any blood thinners, please inform your physician.  How  should I prepare for this procedure?  Do not eat or drink anything at least six hours prior to the procedure.  Bring a driver with you .  It cannot be a taxi.  Come accompanied by an adult that can drive you back, and that is strong enough to help you if your legs get weak or numb from the local anesthetic.  Take all of your medicines the morning of the procedure with just enough water to swallow them.  If you have diabetes, make sure that you are scheduled to have your procedure done first thing in the morning, whenever possible.  If you have diabetes, take only half of your insulin dose and notify our nurse that you have done so as soon as you arrive at the clinic.  If you are diabetic, but only take blood sugar pills (oral hypoglycemic), then do not take them on the morning of your procedure.  You may take them after you have had the procedure.  Do not take aspirin or any aspirin-containing medications, at least eleven (11) days prior to the procedure.  They may prolong bleeding.  Wear loose fitting clothing that may be easy to take off and that you would not mind if it got stained with Betadine or blood.  Do not wear any jewelry or perfume  Remove any nail coloring.  It will interfere with some of our monitoring equipment.  NOTE: Remember that this is not meant to be interpreted as a complete list of all possible complications.  Unforeseen problems may occur.  BLOOD THINNERS The following drugs contain aspirin or other products, which can cause increased bleeding during surgery and should not be taken for 2 weeks prior to and 1 week after surgery.  If you should need take something for relief of minor pain, you may take acetaminophen which is found in Tylenol,m Datril, Anacin-3 and Panadol. It is not blood thinner. The products listed below are.  Do not take any of the products listed below in addition to any listed on your instruction sheet.  A.P.C or A.P.C with Codeine Codeine  Phosphate Capsules #3 Ibuprofen Ridaura  ABC compound Congesprin Imuran rimadil  Advil Cope Indocin Robaxisal  Alka-Seltzer Effervescent Pain Reliever and Antacid Coricidin or Coricidin-D  Indomethacin Rufen  Alka-Seltzer plus Cold Medicine Cosprin Ketoprofen S-A-C Tablets  Anacin Analgesic Tablets or Capsules Coumadin Korlgesic Salflex  Anacin Extra Strength Analgesic tablets or capsules CP-2 Tablets Lanoril Salicylate  Anaprox Cuprimine Capsules Levenox Salocol  Anexsia-D Dalteparin Magan Salsalate  Anodynos  Darvon compound Magnesium Salicylate Sine-off  Ansaid Dasin Capsules Magsal Sodium Salicylate  Anturane Depen Capsules Marnal Soma  APF Arthritis pain formula Dewitt's Pills Measurin Stanback  Argesic Dia-Gesic Meclofenamic Sulfinpyrazone  Arthritis Bayer Timed Release Aspirin Diclofenac Meclomen Sulindac  Arthritis pain formula Anacin Dicumarol Medipren Supac  Analgesic (Safety coated) Arthralgen Diffunasal Mefanamic Suprofen  Arthritis Strength Bufferin Dihydrocodeine Mepro Compound Suprol  Arthropan liquid Dopirydamole Methcarbomol with Aspirin Synalgos  ASA tablets/Enseals Disalcid Micrainin Tagament  Ascriptin Doan's Midol Talwin  Ascriptin A/D Dolene Mobidin Tanderil  Ascriptin Extra Strength Dolobid Moblgesic Ticlid  Ascriptin with Codeine Doloprin or Doloprin with Codeine Momentum Tolectin  Asperbuf Duoprin Mono-gesic Trendar  Aspergum Duradyne Motrin or Motrin IB Triminicin  Aspirin plain, buffered or enteric coated Durasal Myochrisine Trigesic  Aspirin Suppositories Easprin Nalfon Trillsate  Aspirin with Codeine Ecotrin Regular or Extra Strength Naprosyn Uracel  Atromid-S Efficin Naproxen Ursinus  Auranofin Capsules Elmiron Neocylate Vanquish  Axotal Emagrin Norgesic Verin  Azathioprine Empirin or Empirin with Codeine Normiflo Vitamin E  Azolid Emprazil Nuprin Voltaren  Bayer Aspirin plain, buffered or children's or timed BC Tablets or powders Encaprin Orgaran  Warfarin Sodium  Buff-a-Comp Enoxaparin Orudis Zorpin  Buff-a-Comp with Codeine Equegesic Os-Cal-Gesic   Buffaprin Excedrin plain, buffered or Extra Strength Oxalid   Bufferin Arthritis Strength Feldene Oxphenbutazone   Bufferin plain or Extra Strength Feldene Capsules Oxycodone with Aspirin   Bufferin with Codeine Fenoprofen Fenoprofen Pabalate or Pabalate-SF   Buffets II Flogesic Panagesic   Buffinol plain or Extra Strength Florinal or Florinal with Codeine Panwarfarin   Buf-Tabs Flurbiprofen Penicillamine   Butalbital Compound Four-way cold tablets Penicillin   Butazolidin Fragmin Pepto-Bismol   Carbenicillin Geminisyn Percodan   Carna Arthritis Reliever Geopen Persantine   Carprofen Gold's salt Persistin   Chloramphenicol Goody's Phenylbutazone   Chloromycetin Haltrain Piroxlcam   Clmetidine heparin Plaquenil   Cllnoril Hyco-pap Ponstel   Clofibrate Hydroxy chloroquine Propoxyphen         Before stopping any of these medications, be sure to consult the physician who ordered them.  Some, such as Coumadin (Warfarin) are ordered to prevent or treat serious conditions such as "deep thrombosis", "pumonary embolisms", and other heart problems.  The amount of time that you may need off of the medication may also vary with the medication and the reason for which you were taking it.  If you are taking any of these medications, please make sure you notify your pain physician before you undergo any procedures.

## 2015-09-06 NOTE — Progress Notes (Signed)
   Subjective:    Patient ID: Katrina Simon, female    DOB: Feb 27, 1951, 65 y.o.   MRN: [id number]  HPI  Patient is a 65 year old female who returns to pain management for further evaluation and treatment of pain Involving the lower back lower extremity regions with pain involving the knees especially the patient also has had pain involving the shoulder which is fairly well-controlled at this time. The patient states that the pain involving the knees is aggravated by standing walking and becomes more intense as the day progresses patient is without trauma and is with known degenerative joint disease of the knees. The patient is undergone orthopedic evaluation of knees and palpation is for surgical intervention. The patient has had significant relief of pain following previous genicular nerve blocks of knee. We will proceed with genicular nerve blocks of knees at the time of return appointment and patient will continue oxycodone and OxyContin as prescribed. The patient agreed to suggested treatment plan.    Review of Systems     Objective:   Physical Exam  There was tenderness to palpation of the paraspinal musculature region of the cervical region cervical facet region palpation which reproduces pain of mild degree with mild tenderness of the splenius capitis and occipitalis regions. Palpation of the acromioclavicular and glenohumeral joint region was with moderate discomfort with patient having mild difficulty performing drop test. The patient appeared to be with bilaterally equal grip strength and Tinel and Phalen's maneuver were without increased pain of significant degree. Palpation over the thoracic region was with no crepitus of the thoracic region noted and was with minimal muscle spasm. Palpation over the lumbar paraspinal musculatures and lumbar facet region was with mild muscle spasm and patient was with straight leg raising tolerates approximately 20 without an increase of pain with  dorsiflexion noted. EHL strength appeared to be equal with slightly decreased. The knees were attends to palpation of moderately severe degree with crepitus of the knees with negative anterior and posterior drawer signs without ballottement of the patella. There was tenderness over the PSIS and PII S regions of mild degree with mild tenderness of the greater trochanteric region iliotibial band region. There was negative clonus negative Homans.      Assessment & Plan:   Degenerative joint disease of knees  Degenerative disc disease lumbar spine L3-4 and L4-5 degenerative changes predominantly with multilevel degenerative disc disease lumbar spine  Lumbar facet syndrome  Sacroiliac joint dysfunction  Degenerative disc disease cervical spine  Degenerative joint disease of shoulders      PLAN   Continue present medication OxyContin and oxycodone  Genicular nerve blocks of knees to be performed at time of return appointment  F/U PCP Dr. Caryn Section for evaliation of  BP and general medical  condition  F/U surgical evaluation. May consider pending follow-up evaluations  Ask the nurses and secretary the date of MRI of the left shoulder  F/U neurological evaluation. May consider PNCV/EMG studies and other studies pending follow-up evaluations  May consider radiofrequency rhizolysis or intraspinal procedures pending response to present treatment and F/U evaluation   Patient to call Pain Management Center should patient have concerns prior to scheduled return appointment

## 2015-09-24 ENCOUNTER — Ambulatory Visit: Payer: BC Managed Care – PPO | Attending: Pain Medicine | Admitting: Pain Medicine

## 2015-09-24 ENCOUNTER — Encounter: Payer: Self-pay | Admitting: Pain Medicine

## 2015-09-24 VITALS — BP 112/66 | HR 80 | Temp 97.2°F | Resp 12 | Ht 65.0 in | Wt 165.0 lb

## 2015-09-24 DIAGNOSIS — M19012 Primary osteoarthritis, left shoulder: Secondary | ICD-10-CM

## 2015-09-24 DIAGNOSIS — M5135 Other intervertebral disc degeneration, thoracolumbar region: Secondary | ICD-10-CM

## 2015-09-24 DIAGNOSIS — M170 Bilateral primary osteoarthritis of knee: Secondary | ICD-10-CM | POA: Insufficient documentation

## 2015-09-24 DIAGNOSIS — M47816 Spondylosis without myelopathy or radiculopathy, lumbar region: Secondary | ICD-10-CM

## 2015-09-24 DIAGNOSIS — M545 Low back pain: Secondary | ICD-10-CM | POA: Insufficient documentation

## 2015-09-24 DIAGNOSIS — M19011 Primary osteoarthritis, right shoulder: Secondary | ICD-10-CM

## 2015-09-24 DIAGNOSIS — M5136 Other intervertebral disc degeneration, lumbar region: Secondary | ICD-10-CM

## 2015-09-24 DIAGNOSIS — M160 Bilateral primary osteoarthritis of hip: Secondary | ICD-10-CM

## 2015-09-24 DIAGNOSIS — M159 Polyosteoarthritis, unspecified: Secondary | ICD-10-CM

## 2015-09-24 DIAGNOSIS — M150 Primary generalized (osteo)arthritis: Secondary | ICD-10-CM

## 2015-09-24 MED ORDER — LIDOCAINE HCL (PF) 1 % IJ SOLN
10.0000 mL | Freq: Once | INTRAMUSCULAR | Status: AC
  Administered 2015-09-24: 10 mL via SUBCUTANEOUS
  Filled 2015-09-24: qty 10

## 2015-09-24 MED ORDER — MIDAZOLAM HCL 5 MG/5ML IJ SOLN
5.0000 mg | Freq: Once | INTRAMUSCULAR | Status: AC
  Administered 2015-09-24: 3 mg via INTRAVENOUS
  Filled 2015-09-24: qty 5

## 2015-09-24 MED ORDER — LACTATED RINGERS IV SOLN
1000.0000 mL | INTRAVENOUS | Status: DC
  Administered 2015-09-24: 1000 mL via INTRAVENOUS

## 2015-09-24 MED ORDER — BUPIVACAINE HCL (PF) 0.25 % IJ SOLN
30.0000 mL | Freq: Once | INTRAMUSCULAR | Status: AC
  Administered 2015-09-24: 30 mL
  Filled 2015-09-24: qty 30

## 2015-09-24 MED ORDER — TRIAMCINOLONE ACETONIDE 40 MG/ML IJ SUSP
40.0000 mg | Freq: Once | INTRAMUSCULAR | Status: AC
  Administered 2015-09-24: 40 mg
  Filled 2015-09-24: qty 1

## 2015-09-24 MED ORDER — LEVOFLOXACIN 250 MG PO TABS: ORAL_TABLET | ORAL | Status: DC

## 2015-09-24 MED ORDER — FENTANYL CITRATE (PF) 100 MCG/2ML IJ SOLN
100.0000 ug | Freq: Once | INTRAMUSCULAR | Status: AC
  Administered 2015-09-24: 100 ug via INTRAVENOUS
  Filled 2015-09-24: qty 2

## 2015-09-24 MED ORDER — LEVOFLOXACIN IN D5W 250 MG/50ML IV SOLN
250.0000 mg | Freq: Once | INTRAVENOUS | Status: AC
  Administered 2015-09-24: 250 mg via INTRAVENOUS
  Filled 2015-09-24: qty 50

## 2015-09-24 NOTE — Patient Instructions (Addendum)
PLAN   Continue present medications OxyContin and oxycodone and begin antibiotic Levaquin today as prescribed   F/U PCP Dr. Caryn Section for evaliation of  BP and general medical condition.  F/U surgical evaluation. To be considered as discussed  F/U neurological evaluation. May consider PNCV/EMG studies and other studies  May consider radiofrequency rhizolysis or intraspinal procedures pending response to present treatment and F/U evaluation.  Patient to call Pain Management Center should patient have concerns prior to scheduled return appointment. Pain Management Discharge Instructions  General Discharge Instructions :  If you need to reach your doctor call: Monday-Friday 8:00 am - 4:00 pm at [phone] or toll free [phone].  After clinic hours [phone] to have operator reach doctor.  Bring all of your medication bottles to all your appointments in the pain clinic.  To cancel or reschedule your appointment with Pain Management please remember to call 24 hours in advance to avoid a fee.  Refer to the educational materials which you have been given on: General Risks, I had my Procedure. Discharge Instructions, Post Sedation.  Post Procedure Instructions:  The drugs you were given will stay in your system until tomorrow, so for the next 24 hours you should not drive, make any legal decisions or drink any alcoholic beverages.  You may eat anything you prefer, but it is better to start with liquids then soups and crackers, and gradually work up to solid foods.  Please notify your doctor immediately if you have any unusual bleeding, trouble breathing or pain that is not related to your normal pain.  Depending on the type of procedure that was done, some parts of your body may feel week and/or numb.  This usually clears up by tonight or the next day.  Walk with the use of an assistive device or accompanied by an adult for the 24 hours.  You may use ice on the affected area for  the first 24 hours.  Put ice in a Ziploc bag and cover with a towel and place against area 15 minutes on 15 minutes off.  You may switch to heat after 24 hours.

## 2015-09-24 NOTE — Progress Notes (Signed)
Safety precautions to be maintained throughout the outpatient stay will include: orient to surroundings, keep bed in low position, maintain call bell within reach at all times, provide assistance with transfer out of bed and ambulation.  

## 2015-09-24 NOTE — Progress Notes (Signed)
Subjective:    Patient ID: Katrina Simon, female    DOB: 1950/09/08, 65 y.o.   MRN: [id number]  HPI  Genicular nerve blocks of the Left and Right knees   The patient is a 65 y.o. female who returns to the Pain Management Center for further evaluation and treatment of pain involving the lumbar lower extremity region with severe pain of the left and right knees. Prior studies reveal patient to be with significant degenerative joint disease of the knees.  We will proceed with genicular nerve blocks of the left and right knees in an attempt to decrease severity of symptoms, minimize the risk of medication escalation, hopefully retard progression of symptoms and avoid the need for more involved treatment.  The risks benefits and expectations of the procedure were discussed with the patient. The patient was with understanding and in agreement with suggested treatment plan.  DESCRIPTION OF PROCEDURE: Genicular nerve blocks of the left knee. The  procedure was performed with IV Versed and IV fentanyl, conscious sedation and under fluoroscopic guidance.  NEEDLE PLACEMENT FOR BLOCK OF THE LATERAL SUPERIOR GENICULAR NERVE: The patient was taking to the fluoroscopy suite. With the patient supine, with knee in flexed position, Betadine prep of proposed entry site accomplished.  IV Versed, IV fentanyl conscious sedation, EKG, blood pressure, pulse, capnography, and pulse oximetry monitoring were all in place. Under fluoroscopic guidance, a 22-gauge needle was inserted in the region of the left knee with needle placed at the lateral border of the femur at the junction of the shaft of the femur and the condyle of the femur.  Following needle placement at the lateral aspect of the knee, needle placement was then accomplished in the region of the medial aspect of the knee.  NEEDLE PLACEMENT FOR BLOCK OF THE MEDIAL SUPERIOR GENICULAR NERVE:  Under fluoroscopic guidance, a 22 - gauge needle was inserted in the region of  the left knee with needle placed at the medial border of the femur at the junction of the shaft of the femur and the condyle of the femur.   NEEDLE PLACEMENT FOR BLOCK OF THE MEDIAL INFERIOR GENICULAR NERVE:  Under fluoroscopic guidance, a 22 - gauge needle was inserted in the region of the left knee with needle placed at the junction of the shaft and plateau of the tibia.   Following needle placement on AP view of needles placed in all three locations, placement was then verified on lateral view with the tips of the superior lateral and superior medial needles documented to be one half the distance of the shaft of the femur and the tip of the inferior medial geniculate needle documented to be one half the distance of the shaft of the tibia.  Following documentation of needle placements on lateral view, each needle was injected with one mL of 0.25% bupivacaine with Kenalog   GENICULAR NERVE BLOCKS OF THE RIGHT KNEE  The procedure was performed on the right knee exactly as was performed on the left knee utilizing the same technique      . A total of 20 mg of Kenalog was utilized for the entire procedure. The patient tolerated the procedure well.    PLAN 1. Medications: Continue present medications oxycodone and OxyContin 2. Follow-up appointment with PCP Dr. Caryn Section for evaluation of blood pressure and general medical condition. 3. Follow-up surgical evaluation Has been addressed 4. Follow-up neurological evaluation. May consider PNCV EMG studies and other studies 5. The patient may be a candidate for  radiofrequency rhizolysis and other treatment pending response to treatment on today's visit and follow-up evaluation. 6. The patient is advised to adhere to proper body mechanics and avoid activities which appear to aggravate condition   Review of Systems     Objective:   Physical Exam        Assessment & Plan:

## 2015-09-25 ENCOUNTER — Telehealth: Payer: Self-pay | Admitting: *Deleted

## 2015-09-25 NOTE — Telephone Encounter (Signed)
Left voicemail with patient to contact our office if there are questions or concerns re; procedure on yesterday.

## 2015-10-04 ENCOUNTER — Ambulatory Visit: Payer: BC Managed Care – PPO | Attending: Pain Medicine | Admitting: Pain Medicine

## 2015-10-04 ENCOUNTER — Encounter: Payer: Self-pay | Admitting: Pain Medicine

## 2015-10-04 VITALS — BP 121/53 | HR 71 | Temp 98.4°F | Resp 16 | Ht 65.0 in | Wt 165.0 lb

## 2015-10-04 DIAGNOSIS — M533 Sacrococcygeal disorders, not elsewhere classified: Secondary | ICD-10-CM | POA: Insufficient documentation

## 2015-10-04 DIAGNOSIS — M5030 Other cervical disc degeneration, unspecified cervical region: Secondary | ICD-10-CM | POA: Insufficient documentation

## 2015-10-04 DIAGNOSIS — M19012 Primary osteoarthritis, left shoulder: Secondary | ICD-10-CM | POA: Insufficient documentation

## 2015-10-04 DIAGNOSIS — M47816 Spondylosis without myelopathy or radiculopathy, lumbar region: Secondary | ICD-10-CM | POA: Diagnosis not present

## 2015-10-04 DIAGNOSIS — M170 Bilateral primary osteoarthritis of knee: Secondary | ICD-10-CM | POA: Diagnosis not present

## 2015-10-04 DIAGNOSIS — M150 Primary generalized (osteo)arthritis: Secondary | ICD-10-CM

## 2015-10-04 DIAGNOSIS — M19011 Primary osteoarthritis, right shoulder: Secondary | ICD-10-CM | POA: Diagnosis not present

## 2015-10-04 DIAGNOSIS — M5136 Other intervertebral disc degeneration, lumbar region: Secondary | ICD-10-CM | POA: Insufficient documentation

## 2015-10-04 DIAGNOSIS — M25511 Pain in right shoulder: Secondary | ICD-10-CM | POA: Diagnosis present

## 2015-10-04 DIAGNOSIS — M25512 Pain in left shoulder: Secondary | ICD-10-CM | POA: Diagnosis present

## 2015-10-04 DIAGNOSIS — M160 Bilateral primary osteoarthritis of hip: Secondary | ICD-10-CM

## 2015-10-04 DIAGNOSIS — M159 Polyosteoarthritis, unspecified: Secondary | ICD-10-CM

## 2015-10-04 DIAGNOSIS — M5135 Other intervertebral disc degeneration, thoracolumbar region: Secondary | ICD-10-CM

## 2015-10-04 MED ORDER — OXYCODONE HCL 5 MG PO TABS: ORAL_TABLET | ORAL | Status: DC

## 2015-10-04 MED ORDER — OXYCODONE HCL ER 10 MG PO T12A: EXTENDED_RELEASE_TABLET | ORAL | Status: DC

## 2015-10-04 NOTE — Progress Notes (Signed)
Patient here for medication management Safety precautions to be maintained throughout the outpatient stay will include: orient to surroundings, keep bed in low position, maintain call bell within reach at all times, provide assistance with transfer out of bed and ambulation.  

## 2015-10-04 NOTE — Patient Instructions (Addendum)
PLAN   Continue present medication OxyContin and oxycodone  Lumbar facet, medial branch nerve, blocks to be performed at time of return appointment  F/U PCP Dr. Caryn Section for evaliation of  BP and general medical  condition  F/U surgical evaluation. May consider pending follow-up evaluations  F/U neurological evaluation. May consider PNCV/EMG studies and other studies pending follow-up evaluations  May consider radiofrequency rhizolysis or intraspinal procedures pending response to present treatment and F/U evaluation   Patient to call [facility] should patient have concerns prior to scheduled return appointmentGENERAL RISKS AND COMPLICATIONS  What are the risk, side effects and possible complications? Generally speaking, most procedures are safe.  However, with any procedure there are risks, side effects, and the possibility of complications.  The risks and complications are dependent upon the sites that are lesioned, or the type of nerve block to be performed.  The closer the procedure is to the spine, the more serious the risks are.  Great care is taken when placing the radio frequency needles, block needles or lesioning probes, but sometimes complications can occur. 1. Infection: Any time there is an injection through the skin, there is a risk of infection.  This is why sterile conditions are used for these blocks.  There are four possible types of infection. 1. Localized skin infection. 2. Central Nervous System Infection-This can be in the form of Meningitis, which can be deadly. 3. Epidural Infections-This can be in the form of an epidural abscess, which can cause pressure inside of the spine, causing compression of the spinal cord with subsequent paralysis. This would require an emergency surgery to decompress, and there are no guarantees that the patient would recover from the paralysis. 4. Discitis-This is an infection of the intervertebral discs.  It occurs in about 1% of  discography procedures.  It is difficult to treat and it may lead to surgery.        2. Pain: the needles have to go through skin and soft tissues, will cause soreness.       3. Damage to internal structures:  The nerves to be lesioned may be near blood vessels or    other nerves which can be potentially damaged.       4. Bleeding: Bleeding is more common if the patient is taking blood thinners such as  aspirin, Coumadin, Ticiid, Plavix, etc., or if he/she have some genetic predisposition  such as hemophilia. Bleeding into the spinal canal can cause compression of the spinal  cord with subsequent paralysis.  This would require an emergency surgery to  decompress and there are no guarantees that the patient would recover from the  paralysis.       5. Pneumothorax:  Puncturing of a lung is a possibility, every time a needle is introduced in  the area of the chest or upper back.  Pneumothorax refers to free air around the  collapsed lung(s), inside of the thoracic cavity (chest cavity).  Another two possible  complications related to a similar event would include: Hemothorax and Chylothorax.   These are variations of the Pneumothorax, where instead of air around the collapsed  lung(s), you may have blood or chyle, respectively.       6. Spinal headaches: They may occur with any procedures in the area of the spine.       7. Persistent CSF (Cerebro-Spinal Fluid) leakage: This is a rare problem, but may occur  with prolonged intrathecal or epidural catheters either due to the formation  of a fistulous  track or a dural tear.       8. Nerve damage: By working so close to the spinal cord, there is always a possibility of  nerve damage, which could be as serious as a permanent spinal cord injury with  paralysis.       9. Death:  Although rare, severe deadly allergic reactions known as "Anaphylactic  reaction" can occur to any of the medications used.      10. Worsening of the symptoms:  We can always make thing  worse.  What are the chances of something like this happening? Chances of any of this occuring are extremely low.  By statistics, you have more of a chance of getting killed in a motor vehicle accident: while driving to the hospital than any of the above occurring .  Nevertheless, you should be aware that they are possibilities.  In general, it is similar to taking a shower.  Everybody knows that you can slip, hit your head and get killed.  Does that mean that you should not shower again?  Nevertheless always keep in mind that statistics do not mean anything if you happen to be on the wrong side of them.  Even if a procedure has a 1 (one) in a 1,000,000 (million) chance of going wrong, it you happen to be that one..Also, keep in mind that by statistics, you have more of a chance of having something go wrong when taking medications.  Who should not have this procedure? If you are on a blood thinning medication (e.g. Coumadin, Plavix, see list of "Blood Thinners"), or if you have an active infection going on, you should not have the procedure.  If you are taking any blood thinners, please inform your physician.  How should I prepare for this procedure?  Do not eat or drink anything at least six hours prior to the procedure.  Bring a driver with you .  It cannot be a taxi.  Come accompanied by an adult that can drive you back, and that is strong enough to help you if your legs get weak or numb from the local anesthetic.  Take all of your medicines the morning of the procedure with just enough water to swallow them.  If you have diabetes, make sure that you are scheduled to have your procedure done first thing in the morning, whenever possible.  If you have diabetes, take only half of your insulin dose and notify our nurse that you have done so as soon as you arrive at the clinic.  If you are diabetic, but only take blood sugar pills (oral hypoglycemic), then do not take them on the morning of your  procedure.  You may take them after you have had the procedure.  Do not take aspirin or any aspirin-containing medications, at least eleven (11) days prior to the procedure.  They may prolong bleeding.  Wear loose fitting clothing that may be easy to take off and that you would not mind if it got stained with Betadine or blood.  Do not wear any jewelry or perfume  Remove any nail coloring.  It will interfere with some of our monitoring equipment.  NOTE: Remember that this is not meant to be interpreted as a complete list of all possible complications.  Unforeseen problems may occur.  BLOOD THINNERS The following drugs contain aspirin or other products, which can cause increased bleeding during surgery and should not be taken for 2 weeks prior to and  1 week after surgery.  If you should need take something for relief of minor pain, you may take acetaminophen which is found in Tylenol,m Datril, Anacin-3 and Panadol. It is not blood thinner. The products listed below are.  Do not take any of the products listed below in addition to any listed on your instruction sheet.  A.P.C or A.P.C with Codeine Codeine Phosphate Capsules #3 Ibuprofen Ridaura  ABC compound Congesprin Imuran rimadil  Advil Cope Indocin Robaxisal  Alka-Seltzer Effervescent Pain Reliever and Antacid Coricidin or Coricidin-D  Indomethacin Rufen  Alka-Seltzer plus Cold Medicine Cosprin Ketoprofen S-A-C Tablets  Anacin Analgesic Tablets or Capsules Coumadin Korlgesic Salflex  Anacin Extra Strength Analgesic tablets or capsules CP-2 Tablets Lanoril Salicylate  Anaprox Cuprimine Capsules Levenox Salocol  Anexsia-D Dalteparin Magan Salsalate  Anodynos Darvon compound Magnesium Salicylate Sine-off  Ansaid Dasin Capsules Magsal Sodium Salicylate  Anturane Depen Capsules Marnal Soma  APF Arthritis pain formula Dewitt's Pills Measurin Stanback  Argesic Dia-Gesic Meclofenamic Sulfinpyrazone  Arthritis Bayer Timed Release Aspirin  Diclofenac Meclomen Sulindac  Arthritis pain formula Anacin Dicumarol Medipren Supac  Analgesic (Safety coated) Arthralgen Diffunasal Mefanamic Suprofen  Arthritis Strength Bufferin Dihydrocodeine Mepro Compound Suprol  Arthropan liquid Dopirydamole Methcarbomol with Aspirin Synalgos  ASA tablets/Enseals Disalcid Micrainin Tagament  Ascriptin Doan's Midol Talwin  Ascriptin A/D Dolene Mobidin Tanderil  Ascriptin Extra Strength Dolobid Moblgesic Ticlid  Ascriptin with Codeine Doloprin or Doloprin with Codeine Momentum Tolectin  Asperbuf Duoprin Mono-gesic Trendar  Aspergum Duradyne Motrin or Motrin IB Triminicin  Aspirin plain, buffered or enteric coated Durasal Myochrisine Trigesic  Aspirin Suppositories Easprin Nalfon Trillsate  Aspirin with Codeine Ecotrin Regular or Extra Strength Naprosyn Uracel  Atromid-S Efficin Naproxen Ursinus  Auranofin Capsules Elmiron Neocylate Vanquish  Axotal Emagrin Norgesic Verin  Azathioprine Empirin or Empirin with Codeine Normiflo Vitamin E  Azolid Emprazil Nuprin Voltaren  Bayer Aspirin plain, buffered or children's or timed BC Tablets or powders Encaprin Orgaran Warfarin Sodium  Buff-a-Comp Enoxaparin Orudis Zorpin  Buff-a-Comp with Codeine Equegesic Os-Cal-Gesic   Buffaprin Excedrin plain, buffered or Extra Strength Oxalid   Bufferin Arthritis Strength Feldene Oxphenbutazone   Bufferin plain or Extra Strength Feldene Capsules Oxycodone with Aspirin   Bufferin with Codeine Fenoprofen Fenoprofen Pabalate or Pabalate-SF   Buffets II Flogesic Panagesic   Buffinol plain or Extra Strength Florinal or Florinal with Codeine Panwarfarin   Buf-Tabs Flurbiprofen Penicillamine   Butalbital Compound Four-way cold tablets Penicillin   Butazolidin Fragmin Pepto-Bismol   Carbenicillin Geminisyn Percodan   Carna Arthritis Reliever Geopen Persantine   Carprofen Gold's salt Persistin   Chloramphenicol Goody's Phenylbutazone   Chloromycetin Haltrain Piroxlcam    Clmetidine heparin Plaquenil   Cllnoril Hyco-pap Ponstel   Clofibrate Hydroxy chloroquine Propoxyphen         Before stopping any of these medications, be sure to consult the physician who ordered them.  Some, such as Coumadin (Warfarin) are ordered to prevent or treat serious conditions such as "deep thrombosis", "pumonary embolisms", and other heart problems.  The amount of time that you may need off of the medication may also vary with the medication and the reason for which you were taking it.  If you are taking any of these medications, please make sure you notify your pain physician before you undergo any procedures.         Facet Joint Block The facet joints connect the bones of the spine (vertebrae). They make it possible for you to bend, twist, and make other movements with your spine.  They also prevent you from overbending, overtwisting, and making other excessive movements.  A facet joint block is a procedure where a numbing medicine (anesthetic) is injected into a facet joint. Often, a type of anti-inflammatory medicine called a steroid is also injected. A facet joint block may be done for two reasons:  2. Diagnosis. A facet joint block may be done as a test to see whether neck or back pain is caused by a worn-down or infected facet joint. If the pain gets better after a facet joint block, it means the pain is probably coming from the facet joint. If the pain does not get better, it means the pain is probably not coming from the facet joint.  3. Therapy. A facet joint block may be done to relieve neck or back pain caused by a facet joint. A facet joint block is only done as a therapy if the pain does not improve with medicine, exercise programs, physical therapy, and other forms of pain management. LET [facility] CARE PROVIDER KNOW ABOUT:   Any allergies you have.   All medicines you are taking, including vitamins, herbs, eyedrops, and over-the-counter medicines and creams.    Previous problems you or members of your family have had with the use of anesthetics.   Any blood disorders you have had.   Other health problems you have. RISKS AND COMPLICATIONS Generally, having a facet joint block is safe. However, as with any procedure, complications can occur. Possible complications associated with having a facet joint block include:   Bleeding.   Injury to a nerve near the injection site.   Pain at the injection site.   Weakness or numbness in areas controlled by nerves near the injection site.   Infection.   Temporary fluid retention.   Allergic reaction to anesthetics or medicines used during the procedure. BEFORE THE PROCEDURE   Follow your health care provider's instructions if you are taking dietary supplements or medicines. You may need to stop taking them or reduce your dosage.   Do not take any new dietary supplements or medicines without asking your health care provider first.   Follow your health care provider's instructions about eating and drinking before the procedure. You may need to stop eating and drinking several hours before the procedure.   Arrange to have an adult drive you home after the procedure. PROCEDURE 12. You may need to remove your clothing and dress in an open-back gown so that your health care provider can access your spine.  13. The procedure will be done while you are lying on an X-ray table. Most of the time you will be asked to lie on your stomach, but you may be asked to lie in a different position if an injection will be made in your neck.  14. Special machines will be used to monitor your oxygen levels, heart rate, and blood pressure.  15. If an injection will be made in your neck, an intravenous (IV) tube will be inserted into one of your veins. Fluids and medicine will flow directly into your body through the IV tube.  16. The area over the facet joint where the injection will be made will be  cleaned with an antiseptic soap. The surrounding skin will be covered with sterile drapes.  17. An anesthetic will be applied to your skin to make the injection area numb. You may feel a temporary stinging or burning sensation.  18. A video X-ray machine will be used to locate the joint.  A contrast dye may be injected into the facet joint area to help with locating the joint.  19. When the joint is located, an anesthetic medicine will be injected into the joint through the needle.  35. Your health care provider will ask you whether you feel pain relief. If you do feel relief, a steroid may be injected to provide pain relief for a longer period of time. If you do not feel relief or feel only partial relief, additional injections of an anesthetic may be made in other facet joints.  21. The needle will be removed, the skin will be cleansed, and bandages will be applied.  AFTER THE PROCEDURE   You will be observed for 15-30 minutes before being allowed to go home. Do not drive. Have an adult drive you or take a taxi or public transportation instead.   If you feel pain relief, the pain will return in several hours or days when the anesthetic wears off.   You may feel pain relief 2-14 days after the procedure. The amount of time this relief lasts varies from person to person.   It is normal to feel some tenderness over the injected area(s) for 2 days following the procedure.   If you have diabetes, you may have a temporary increase in blood sugar.   This information is not intended to replace advice given to you by your health care provider. Make sure you discuss any questions you have with your health care provider.   Document Released: 07/23/2006 Document Revised: 03/24/2014 Document Reviewed: 12/22/2011 Elsevier Interactive Patient Education [employer].

## 2015-10-04 NOTE — Progress Notes (Signed)
   Subjective:    Patient ID: Katrina Simon, female    DOB: 30-May-1950, 65 y.o.   MRN: [id number]  HPI  The patient is a 65 year old female who returns to pain management for further evaluation and treatment of pain involving the region of the shoulders back and lower extremity regions. The patient is status post genicular nerve blocks of the knee with significant improvement of pain of the knees. The patient states that she is in hopes of being able to undergo treatment for her back at this time. We discussed patient's condition and we will consider performing lumbar facet, medial branch nerve, blocks at time return appointment in attempt to decrease severity of symptoms, minimize progression of symptoms, and avoid the need for more involved treatment. All agreed with suggested treatment plan the patient will continue oxycodone and OxyContin as prescribed at this time  Review of Systems     Objective:   Physical Exam   There was tenderness of the splenius capitis and occipitalis region palpation which reproduces mild discomfort with mild tenderness of the cervical facet cervical paraspinal musculature region as well as the thoracic facet thoracic paraspinal musculature region. No crepitus of the thoracic region was noted. There appeared to be unremarkable Spurling's maneuver. Tinel and Phalen's maneuver were without increase of pain of significant degree. There was tenderness over the lumbar facet lumbar paraspinal musculature region a moderate degree with lateral bending rotation extension and palpation of the lumbar facets reproducing moderately severe discomfort. Palpation over the PSIS and PII S region was attends to palpation of moderately severe degree. No definite sensory deficit or dermatomal distribution detected. There was negative clonus negative Homans. 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 Caryn Section for evaliation of  BP and general medical  condition  F/U surgical evaluation. May consider pending follow-up evaluations  F/U neurological evaluation. May consider PNCV/EMG studies and other studies pending follow-up evaluations  May consider radiofrequency rhizolysis or intraspinal procedures pending response to present treatment and F/U evaluation   Patient to call Pain Management Center should patient have concerns prior to scheduled return appointment

## 2015-10-10 ENCOUNTER — Ambulatory Visit: Payer: BC Managed Care – PPO | Attending: Pain Medicine | Admitting: Pain Medicine

## 2015-10-10 ENCOUNTER — Encounter: Payer: Self-pay | Admitting: Pain Medicine

## 2015-10-10 VITALS — BP 118/65 | HR 72 | Temp 97.4°F | Resp 14 | Ht 65.0 in | Wt 165.0 lb

## 2015-10-10 DIAGNOSIS — M159 Polyosteoarthritis, unspecified: Secondary | ICD-10-CM

## 2015-10-10 DIAGNOSIS — M150 Primary generalized (osteo)arthritis: Secondary | ICD-10-CM

## 2015-10-10 DIAGNOSIS — M47816 Spondylosis without myelopathy or radiculopathy, lumbar region: Secondary | ICD-10-CM | POA: Diagnosis not present

## 2015-10-10 DIAGNOSIS — M160 Bilateral primary osteoarthritis of hip: Secondary | ICD-10-CM

## 2015-10-10 DIAGNOSIS — M170 Bilateral primary osteoarthritis of knee: Secondary | ICD-10-CM

## 2015-10-10 DIAGNOSIS — M5135 Other intervertebral disc degeneration, thoracolumbar region: Secondary | ICD-10-CM

## 2015-10-10 DIAGNOSIS — M19012 Primary osteoarthritis, left shoulder: Secondary | ICD-10-CM

## 2015-10-10 DIAGNOSIS — M545 Low back pain: Secondary | ICD-10-CM | POA: Diagnosis present

## 2015-10-10 DIAGNOSIS — M5136 Other intervertebral disc degeneration, lumbar region: Secondary | ICD-10-CM | POA: Insufficient documentation

## 2015-10-10 DIAGNOSIS — M79606 Pain in leg, unspecified: Secondary | ICD-10-CM | POA: Diagnosis present

## 2015-10-10 DIAGNOSIS — M19011 Primary osteoarthritis, right shoulder: Secondary | ICD-10-CM

## 2015-10-10 MED ORDER — ORPHENADRINE CITRATE 30 MG/ML IJ SOLN
60.0000 mg | Freq: Once | INTRAMUSCULAR | Status: AC
  Administered 2015-10-10: 60 mg via INTRAMUSCULAR

## 2015-10-10 MED ORDER — LEVOFLOXACIN 250 MG PO TABS: 250.0000 mg | ORAL_TABLET | Freq: Every day | ORAL | 0 refills | Status: DC

## 2015-10-10 MED ORDER — LACTATED RINGERS IV SOLN: 1000.0000 mL | INTRAVENOUS | Status: DC

## 2015-10-10 MED ORDER — MIDAZOLAM HCL 5 MG/5ML IJ SOLN: 5.0000 mg | Freq: Once | INTRAMUSCULAR | Status: DC

## 2015-10-10 MED ORDER — TRIAMCINOLONE ACETONIDE 40 MG/ML IJ SUSP
40.0000 mg | Freq: Once | INTRAMUSCULAR | Status: AC
  Administered 2015-10-10: 40 mg

## 2015-10-10 MED ORDER — CEFAZOLIN SODIUM 1 G IJ SOLR
INTRAMUSCULAR | Status: AC
  Filled 2015-10-10: qty 10

## 2015-10-10 MED ORDER — LEVOFLOXACIN IN D5W 250 MG/50ML IV SOLN
INTRAVENOUS | Status: AC
  Administered 2015-10-10: 200 mg via INTRAVENOUS
  Filled 2015-10-10: qty 50

## 2015-10-10 MED ORDER — ORPHENADRINE CITRATE 30 MG/ML IJ SOLN
INTRAMUSCULAR | Status: AC
  Filled 2015-10-10: qty 2

## 2015-10-10 MED ORDER — BUPIVACAINE HCL (PF) 0.25 % IJ SOLN
30.0000 mL | Freq: Once | INTRAMUSCULAR | Status: AC
  Administered 2015-10-10: 30 mL

## 2015-10-10 MED ORDER — BUPIVACAINE HCL (PF) 0.25 % IJ SOLN
INTRAMUSCULAR | Status: AC
  Filled 2015-10-10: qty 30

## 2015-10-10 MED ORDER — MIDAZOLAM HCL 5 MG/5ML IJ SOLN
INTRAMUSCULAR | Status: AC
  Administered 2015-10-10: 2 mg
  Filled 2015-10-10: qty 5

## 2015-10-10 MED ORDER — FENTANYL CITRATE (PF) 100 MCG/2ML IJ SOLN
100.0000 ug | Freq: Once | INTRAMUSCULAR | Status: AC
  Administered 2015-10-10: 100 ug via INTRAVENOUS

## 2015-10-10 MED ORDER — LEVOFLOXACIN IN D5W 250 MG/50ML IV SOLN
250.0000 mg | Freq: Once | INTRAVENOUS | Status: AC
  Administered 2015-10-10: 200 mg via INTRAVENOUS

## 2015-10-10 MED ORDER — FENTANYL CITRATE (PF) 100 MCG/2ML IJ SOLN
INTRAMUSCULAR | Status: AC
  Administered 2015-10-10: 100 ug via INTRAVENOUS
  Filled 2015-10-10: qty 2

## 2015-10-10 MED ORDER — TRIAMCINOLONE ACETONIDE 40 MG/ML IJ SUSP
INTRAMUSCULAR | Status: AC
  Filled 2015-10-10: qty 1

## 2015-10-10 NOTE — Progress Notes (Signed)
PROCEDURE PERFORMED: Lumbar facet (medial branch block)   NOTE: The patient is a 65 y.o. female who returns to [facility] for further evaluation and treatment of pain involving the lumbar and lower extremity region. 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 There is concern regarding significant component of patient's pain being due to lumbar facet syndrome The risks, benefits, and expectations of the procedure have been discussed and explained to the patient who was understanding and in agreement with suggested treatment plan. We will proceed with interventional treatment as discussed and as explained to the patient who was understanding and wished to proceed with procedure as planned.   DESCRIPTION OF PROCEDURE: Lumbar facet (medial branch block) with IV Versed, IV fentanyl conscious sedation, EKG, blood pressure, pulse, capnography, and pulse oximetry monitoring. The procedure was performed with the patient in the prone position. Betadine prep of proposed entry site performed.   NEEDLE PLACEMENT AT: Left L 2 lumbar facet (medial branch block). Under fluoroscopic guidance with oblique orientation of 15 degrees, a 22-gauge needle was inserted at the L 2 vertebral body level with needle placed at the targeted area of Burton's Eye or Eye of the Scotty Dog with documentation of needle placement in the superior and lateral border of targeted area of Burton's Eye or Eye of the Scotty Dog with oblique orientation of 15 degrees. Following documentation of needle placement at the L 2 vertebral body level, needle placement was then accomplished at the L 3 vertebral body level.   NEEDLE PLACEMENT AT L3, L4, and L5 VERTEBRAL BODY LEVELS ON THE LEFT SIDE The procedure was performed at the L3, L4, and L5 vertebral body levels exactly as was  performed at the L 2 vertebral body level utilizing the same technique and under fluoroscopic guidance.  NEEDLE PLACEMENT AT THE SACRAL ALA with AP view of the lumbosacral spine. With the patient in the prone position, Betadine prep of proposed entry site accomplished, a 22 gauge needle was inserted in the region of the sacral ala (groove formed by the superior articulating process of S1 and the sacral wing). Following documentation of needle placement at the sacral ala, .   Needle placement was then verified at all levels on lateral view. Following documentation of needle placement at all levels on lateral view and following negative aspiration for heme and CSF, each level was injected with 1 mL of 0.25% bupivacaine with Kenalog.     LUMBAR FACET, MEDIAL BRANCH NERVE, BLOCKS PERFORMED ON THE RIGHT SIDE   The procedure was performed on the right side exactly as was performed on the left side at the same levels and utilizing the same technique under fluoroscopic guidance.     The patient tolerated the procedure well. A total of 40 mg of Kenalog was utilized for the procedure.   PLAN:  1. Medications: The patient will continue presently prescribed medications oxycodone and OxyContin 2. May consider modification of treatment regimen at time of return appointment pending response to treatment rendered on today's visit. 3. The patient is to follow-up with primary care physician Dr. Lelon Huh for further evaluation of blood pressure and general medical condition status post steroid injection performed on today's visit. 4. Surgical follow-up evaluation. Has been addressed. Patient is without plans for surgical intervention at this time 5. Neurological follow-up evaluation. May consider PNCV EMG studies and other  studies 6. The patient may be candidate for radiofrequency procedures, implantation type procedures, and other treatment pending response to treatment and follow-up evaluation. 7. The  patient has been advised to call the Pain Management Center prior to scheduled return appointment should there be significant change in condition or should patient have other concerns regarding condition prior to scheduled return appointment.  The patient is understanding and in agreement with suggested treatment plan.

## 2015-10-10 NOTE — Patient Instructions (Addendum)
PLAN   Continue present medications OxyContin and oxycodone and begin antibiotic Levaquin today as prescribed   F/U PCP Dr. Caryn Section for evaliation of  BP and general medical condition.  F/U surgical evaluation. To be considered as discussed  F/U neurological evaluation. May consider PNCV/EMG studies and other studies  May consider radiofrequency rhizolysis or intraspinal procedures pending response to present treatment and F/U evaluation.  Patient to call Pain Management Center should patient have concerns prior to scheduled return appointment.Pain Management Discharge Instructions  General Discharge Instructions :  If you need to reach your doctor call: Monday-Friday 8:00 am - 4:00 pm at [phone] or toll free [phone].  After clinic hours [phone] to have operator reach doctor.  Bring all of your medication bottles to all your appointments in the pain clinic.  To cancel or reschedule your appointment with Pain Management please remember to call 24 hours in advance to avoid a fee.  Refer to the educational materials which you have been given on: General Risks, I had my Procedure. Discharge Instructions, Post Sedation.  Post Procedure Instructions:  The drugs you were given will stay in your system until tomorrow, so for the next 24 hours you should not drive, make any legal decisions or drink any alcoholic beverages.  You may eat anything you prefer, but it is better to start with liquids then soups and crackers, and gradually work up to solid foods.  Please notify your doctor immediately if you have any unusual bleeding, trouble breathing or pain that is not related to your normal pain.  Depending on the type of procedure that was done, some parts of your body may feel week and/or numb.  This usually clears up by tonight or the next day.  Walk with the use of an assistive device or accompanied by an adult for the 24 hours.  You may use ice on the affected area for  the first 24 hours.  Put ice in a Ziploc bag and cover with a towel and place against area 15 minutes on 15 minutes off.  You may switch to heat after 24 hours.GENERAL RISKS AND COMPLICATIONS  What are the risk, side effects and possible complications? Generally speaking, most procedures are safe.  However, with any procedure there are risks, side effects, and the possibility of complications.  The risks and complications are dependent upon the sites that are lesioned, or the type of nerve block to be performed.  The closer the procedure is to the spine, the more serious the risks are.  Great care is taken when placing the radio frequency needles, block needles or lesioning probes, but sometimes complications can occur. 1. Infection: Any time there is an injection through the skin, there is a risk of infection.  This is why sterile conditions are used for these blocks.  There are four possible types of infection. 1. Localized skin infection. 2. Central Nervous System Infection-This can be in the form of Meningitis, which can be deadly. 3. Epidural Infections-This can be in the form of an epidural abscess, which can cause pressure inside of the spine, causing compression of the spinal cord with subsequent paralysis. This would require an emergency surgery to decompress, and there are no guarantees that the patient would recover from the paralysis. 4. Discitis-This is an infection of the intervertebral discs.  It occurs in about 1% of discography procedures.  It is difficult to treat and it may lead to surgery.        2. Pain: the  needles have to go through skin and soft tissues, will cause soreness.       3. Damage to internal structures:  The nerves to be lesioned may be near blood vessels or    other nerves which can be potentially damaged.       4. Bleeding: Bleeding is more common if the patient is taking blood thinners such as  aspirin, Coumadin, Ticiid, Plavix, etc., or if he/she have some genetic  predisposition  such as hemophilia. Bleeding into the spinal canal can cause compression of the spinal  cord with subsequent paralysis.  This would require an emergency surgery to  decompress and there are no guarantees that the patient would recover from the  paralysis.       5. Pneumothorax:  Puncturing of a lung is a possibility, every time a needle is introduced in  the area of the chest or upper back.  Pneumothorax refers to free air around the  collapsed lung(s), inside of the thoracic cavity (chest cavity).  Another two possible  complications related to a similar event would include: Hemothorax and Chylothorax.   These are variations of the Pneumothorax, where instead of air around the collapsed  lung(s), you may have blood or chyle, respectively.       6. Spinal headaches: They may occur with any procedures in the area of the spine.       7. Persistent CSF (Cerebro-Spinal Fluid) leakage: This is a rare problem, but may occur  with prolonged intrathecal or epidural catheters either due to the formation of a fistulous  track or a dural tear.       8. Nerve damage: By working so close to the spinal cord, there is always a possibility of  nerve damage, which could be as serious as a permanent spinal cord injury with  paralysis.       9. Death:  Although rare, severe deadly allergic reactions known as "Anaphylactic  reaction" can occur to any of the medications used.      10. Worsening of the symptoms:  We can always make thing worse.  What are the chances of something like this happening? Chances of any of this occuring are extremely low.  By statistics, you have more of a chance of getting killed in a motor vehicle accident: while driving to the hospital than any of the above occurring .  Nevertheless, you should be aware that they are possibilities.  In general, it is similar to taking a shower.  Everybody knows that you can slip, hit your head and get killed.  Does that mean that you should not  shower again?  Nevertheless always keep in mind that statistics do not mean anything if you happen to be on the wrong side of them.  Even if a procedure has a 1 (one) in a 1,000,000 (million) chance of going wrong, it you happen to be that one..Also, keep in mind that by statistics, you have more of a chance of having something go wrong when taking medications.  Who should not have this procedure? If you are on a blood thinning medication (e.g. Coumadin, Plavix, see list of "Blood Thinners"), or if you have an active infection going on, you should not have the procedure.  If you are taking any blood thinners, please inform your physician.  How should I prepare for this procedure?  Do not eat or drink anything at least six hours prior to the procedure.  Bring a driver with you .  It cannot be a taxi.  Come accompanied by an adult that can drive you back, and that is strong enough to help you if your legs get weak or numb from the local anesthetic.  Take all of your medicines the morning of the procedure with just enough water to swallow them.  If you have diabetes, make sure that you are scheduled to have your procedure done first thing in the morning, whenever possible.  If you have diabetes, take only half of your insulin dose and notify our nurse that you have done so as soon as you arrive at the clinic.  If you are diabetic, but only take blood sugar pills (oral hypoglycemic), then do not take them on the morning of your procedure.  You may take them after you have had the procedure.  Do not take aspirin or any aspirin-containing medications, at least eleven (11) days prior to the procedure.  They may prolong bleeding.  Wear loose fitting clothing that may be easy to take off and that you would not mind if it got stained with Betadine or blood.  Do not wear any jewelry or perfume  Remove any nail coloring.  It will interfere with some of our monitoring equipment.  NOTE: Remember that  this is not meant to be interpreted as a complete list of all possible complications.  Unforeseen problems may occur.  BLOOD THINNERS The following drugs contain aspirin or other products, which can cause increased bleeding during surgery and should not be taken for 2 weeks prior to and 1 week after surgery.  If you should need take something for relief of minor pain, you may take acetaminophen which is found in Tylenol,m Datril, Anacin-3 and Panadol. It is not blood thinner. The products listed below are.  Do not take any of the products listed below in addition to any listed on your instruction sheet.  A.P.C or A.P.C with Codeine Codeine Phosphate Capsules #3 Ibuprofen Ridaura  ABC compound Congesprin Imuran rimadil  Advil Cope Indocin Robaxisal  Alka-Seltzer Effervescent Pain Reliever and Antacid Coricidin or Coricidin-D  Indomethacin Rufen  Alka-Seltzer plus Cold Medicine Cosprin Ketoprofen S-A-C Tablets  Anacin Analgesic Tablets or Capsules Coumadin Korlgesic Salflex  Anacin Extra Strength Analgesic tablets or capsules CP-2 Tablets Lanoril Salicylate  Anaprox Cuprimine Capsules Levenox Salocol  Anexsia-D Dalteparin Magan Salsalate  Anodynos Darvon compound Magnesium Salicylate Sine-off  Ansaid Dasin Capsules Magsal Sodium Salicylate  Anturane Depen Capsules Marnal Soma  APF Arthritis pain formula Dewitt's Pills Measurin Stanback  Argesic Dia-Gesic Meclofenamic Sulfinpyrazone  Arthritis Bayer Timed Release Aspirin Diclofenac Meclomen Sulindac  Arthritis pain formula Anacin Dicumarol Medipren Supac  Analgesic (Safety coated) Arthralgen Diffunasal Mefanamic Suprofen  Arthritis Strength Bufferin Dihydrocodeine Mepro Compound Suprol  Arthropan liquid Dopirydamole Methcarbomol with Aspirin Synalgos  ASA tablets/Enseals Disalcid Micrainin Tagament  Ascriptin Doan's Midol Talwin  Ascriptin A/D Dolene Mobidin Tanderil  Ascriptin Extra Strength Dolobid Moblgesic Ticlid  Ascriptin with Codeine  Doloprin or Doloprin with Codeine Momentum Tolectin  Asperbuf Duoprin Mono-gesic Trendar  Aspergum Duradyne Motrin or Motrin IB Triminicin  Aspirin plain, buffered or enteric coated Durasal Myochrisine Trigesic  Aspirin Suppositories Easprin Nalfon Trillsate  Aspirin with Codeine Ecotrin Regular or Extra Strength Naprosyn Uracel  Atromid-S Efficin Naproxen Ursinus  Auranofin Capsules Elmiron Neocylate Vanquish  Axotal Emagrin Norgesic Verin  Azathioprine Empirin or Empirin with Codeine Normiflo Vitamin E  Azolid Emprazil Nuprin Voltaren  Bayer Aspirin plain, buffered or children's or timed BC Tablets or powders Encaprin Orgaran Warfarin Sodium  Buff-a-Comp Enoxaparin Orudis Zorpin  Buff-a-Comp with Codeine Equegesic Os-Cal-Gesic   Buffaprin Excedrin plain, buffered or Extra Strength Oxalid   Bufferin Arthritis Strength Feldene Oxphenbutazone   Bufferin plain or Extra Strength Feldene Capsules Oxycodone with Aspirin   Bufferin with Codeine Fenoprofen Fenoprofen Pabalate or Pabalate-SF   Buffets II Flogesic Panagesic   Buffinol plain or Extra Strength Florinal or Florinal with Codeine Panwarfarin   Buf-Tabs Flurbiprofen Penicillamine   Butalbital Compound Four-way cold tablets Penicillin   Butazolidin Fragmin Pepto-Bismol   Carbenicillin Geminisyn Percodan   Carna Arthritis Reliever Geopen Persantine   Carprofen Gold's salt Persistin   Chloramphenicol Goody's Phenylbutazone   Chloromycetin Haltrain Piroxlcam   Clmetidine heparin Plaquenil   Cllnoril Hyco-pap Ponstel   Clofibrate Hydroxy chloroquine Propoxyphen         Before stopping any of these medications, be sure to consult the physician who ordered them.  Some, such as Coumadin (Warfarin) are ordered to prevent or treat serious conditions such as "deep thrombosis", "pumonary embolisms", and other heart problems.  The amount of time that you may need off of the medication may also vary with the medication and the reason for which  you were taking it.  If you are taking any of these medications, please make sure you notify your pain physician before you undergo any procedures.

## 2015-10-10 NOTE — Progress Notes (Signed)
Safety precautions to be maintained throughout the outpatient stay will include: orient to surroundings, keep bed in low position, maintain call bell within reach at all times, provide assistance with transfer out of bed and ambulation.  

## 2015-10-11 ENCOUNTER — Telehealth: Payer: Self-pay | Admitting: *Deleted

## 2015-10-11 NOTE — Telephone Encounter (Signed)
Spoke with patient's son in [employer].  Patient is not available but he states that she is doing well.

## 2015-11-01 ENCOUNTER — Ambulatory Visit: Payer: No Typology Code available for payment source | Attending: Pain Medicine | Admitting: Pain Medicine

## 2015-11-01 ENCOUNTER — Encounter: Payer: Self-pay | Admitting: Pain Medicine

## 2015-11-01 VITALS — BP 136/79 | HR 69 | Temp 97.7°F | Resp 15 | Ht 65.0 in | Wt 165.0 lb

## 2015-11-01 DIAGNOSIS — M5030 Other cervical disc degeneration, unspecified cervical region: Secondary | ICD-10-CM | POA: Insufficient documentation

## 2015-11-01 DIAGNOSIS — M533 Sacrococcygeal disorders, not elsewhere classified: Secondary | ICD-10-CM | POA: Diagnosis not present

## 2015-11-01 DIAGNOSIS — M5135 Other intervertebral disc degeneration, thoracolumbar region: Secondary | ICD-10-CM

## 2015-11-01 DIAGNOSIS — M25512 Pain in left shoulder: Secondary | ICD-10-CM | POA: Diagnosis present

## 2015-11-01 DIAGNOSIS — M5136 Other intervertebral disc degeneration, lumbar region: Secondary | ICD-10-CM | POA: Insufficient documentation

## 2015-11-01 DIAGNOSIS — M19012 Primary osteoarthritis, left shoulder: Secondary | ICD-10-CM | POA: Diagnosis not present

## 2015-11-01 DIAGNOSIS — M47816 Spondylosis without myelopathy or radiculopathy, lumbar region: Secondary | ICD-10-CM | POA: Diagnosis not present

## 2015-11-01 DIAGNOSIS — M25511 Pain in right shoulder: Secondary | ICD-10-CM | POA: Diagnosis present

## 2015-11-01 DIAGNOSIS — M159 Polyosteoarthritis, unspecified: Secondary | ICD-10-CM

## 2015-11-01 DIAGNOSIS — M170 Bilateral primary osteoarthritis of knee: Secondary | ICD-10-CM | POA: Diagnosis not present

## 2015-11-01 DIAGNOSIS — M19011 Primary osteoarthritis, right shoulder: Secondary | ICD-10-CM | POA: Diagnosis not present

## 2015-11-01 DIAGNOSIS — M150 Primary generalized (osteo)arthritis: Secondary | ICD-10-CM

## 2015-11-01 MED ORDER — OXYCODONE HCL 5 MG PO TABS: ORAL_TABLET | ORAL | 0 refills | Status: DC

## 2015-11-01 MED ORDER — OXYCODONE HCL ER 10 MG PO T12A: EXTENDED_RELEASE_TABLET | ORAL | 0 refills | Status: DC

## 2015-11-01 NOTE — Progress Notes (Signed)
     The patient is a 65 year old female who returns to pain management for further evaluation and treatment of pain involving the shoulders mid lower back lower extremity region. The patient has had significant improvement of her pain without significant pain occurring in the lower back region status post lumbar facet, medial branch nerve blocks. The patient also is undergone genicular nerve blocks of the knees with significant improvement of pain involving the knees. At the present time patient is able to perform activities of daily living and patient discussed riding her horse. Patient states that she is planning on getting a smaller horse and that she is looking forward to resuming riding horse. We discussed additional treatment regimen and we'll avoid interventional treatment and patient will continue oxycodone and OxyContin at this time. We remain available to consider modification of treatment regimen pending response to treatment and follow-up evaluation. All agreed to suggested treatment plan      Physical examination  There was tenderness to palpation of the splenius capitis and occipitalis region a mild degree with mild tenderness of the acromioclavicular and glenohumeral joint region. Patient was able to perform drop test with moderate difficulty. The patient appeared to be with minimal tenderness of the cervical facet cervical paraspinal musculature and thoracic facet thoracic paraspinal musculature region. There was mild tenderness of the acromioclavicular and glenohumeral joint region and patient was able to perform drop test with minimal difficulty. Palpation of the thoracic region was attends to palpation of mild to moderate degree with no crepitus of the thoracic region noted. There was tenderness over the lumbar facet lumbar paraspinal musculatures and a mild to moderate degree with mild to moderate increase of pain with lateral bending rotation extension and palpation over the lumbar  facets. Straight leg raising was tolerates approximately 30 without a definite increase of pain with dorsiflexion noted. DTRs were difficult to elicit appeared to be trace at the knees. There was minimal tenderness to palpation of the knees with crepitus of the knees with negative anterior and posterior drawer signs without ballottement of the patella. No definite sensory deficit or dermatomal distribution detected. There was negative clonus negative Homans. Abdomen nontender with no costovertebral tenderness noted     Assessment    Degenerative joint disease of knees  Degenerative disc disease lumbar spine L3-4 and L4-5 degenerative changes predominantly with multilevel degenerative disc disease lumbar spine  Lumbar facet syndrome  Sacroiliac joint dysfunction  Degenerative disc disease cervical spine  Degenerative joint disease of shoulders     PLAN   Continue present medication OxyContin and oxycodone  F/U PCP Dr. Caryn Section for evaliation of  BP and general medical  condition  F/U surgical evaluation. May consider pending follow-up evaluations  F/U neurological evaluation. May consider PNCV/EMG studies and other studies pending follow-up evaluations  May consider radiofrequency rhizolysis or intraspinal procedures pending response to present treatment and F/U evaluation   Patient to call Pain Management Center should patient have concerns prior to scheduled return appointment

## 2015-11-01 NOTE — Care Management (Signed)
Safety precautions to be maintained throughout the outpatient stay will include: orient to surroundings, keep bed in low position, maintain call bell within reach at all times, provide assistance with transfer out of bed and ambulation.  

## 2015-11-01 NOTE — Patient Instructions (Signed)
PLAN   Continue present medication OxyContin and oxycodone  F/U PCP Dr. Caryn Section for evaliation of  BP and general medical  condition  F/U surgical evaluation. May consider pending follow-up evaluations  F/U neurological evaluation. May consider PNCV/EMG studies and other studies pending follow-up evaluations  May consider radiofrequency rhizolysis or intraspinal procedures pending response to present treatment and F/U evaluation   Patient to call Pain Management Center should patient have concerns prior to scheduled return appointment

## 2015-11-15 ENCOUNTER — Telehealth: Payer: Self-pay | Admitting: *Deleted

## 2015-11-29 ENCOUNTER — Ambulatory Visit: Payer: No Typology Code available for payment source | Admitting: Pain Medicine

## 2015-12-02 ENCOUNTER — Other Ambulatory Visit: Payer: Self-pay | Admitting: Pain Medicine

## 2015-12-03 ENCOUNTER — Other Ambulatory Visit: Payer: Self-pay | Admitting: Pain Medicine

## 2016-01-07 ENCOUNTER — Other Ambulatory Visit: Payer: Self-pay | Admitting: Pain Medicine

## 2016-02-14 ENCOUNTER — Other Ambulatory Visit: Payer: Self-pay | Admitting: Medical Oncology

## 2016-02-14 DIAGNOSIS — Z1231 Encounter for screening mammogram for malignant neoplasm of breast: Secondary | ICD-10-CM

## 2016-02-15 ENCOUNTER — Other Ambulatory Visit: Payer: Self-pay | Admitting: Medical Oncology

## 2016-02-15 ENCOUNTER — Ambulatory Visit
Admission: RE | Admit: 2016-02-15 | Discharge: 2016-02-15 | Disposition: A | Discharge status: Home / Self Care | Payer: BC Managed Care – PPO | Source: Ambulatory Visit | Attending: Medical Oncology | Admitting: Medical Oncology

## 2016-02-15 DIAGNOSIS — Z1231 Encounter for screening mammogram for malignant neoplasm of breast: Secondary | ICD-10-CM | POA: Diagnosis not present

## 2016-04-07 ENCOUNTER — Other Ambulatory Visit: Payer: Self-pay | Admitting: Pain Medicine

## 2016-04-16 ENCOUNTER — Other Ambulatory Visit: Payer: Self-pay | Admitting: Pain Medicine

## 2016-08-04 ENCOUNTER — Other Ambulatory Visit: Payer: Self-pay | Admitting: Student

## 2016-08-04 DIAGNOSIS — K220 Achalasia of cardia: Secondary | ICD-10-CM

## 2016-08-08 ENCOUNTER — Ambulatory Visit
Admission: RE | Admit: 2016-08-08 | Discharge: 2016-08-08 | Disposition: A | Discharge status: Home / Self Care | Payer: Medicare Other | Source: Ambulatory Visit | Attending: Student | Admitting: Student

## 2016-08-08 DIAGNOSIS — K449 Diaphragmatic hernia without obstruction or gangrene: Secondary | ICD-10-CM | POA: Insufficient documentation

## 2016-08-08 DIAGNOSIS — K220 Achalasia of cardia: Secondary | ICD-10-CM | POA: Diagnosis present

## 2016-08-08 DIAGNOSIS — K219 Gastro-esophageal reflux disease without esophagitis: Secondary | ICD-10-CM | POA: Diagnosis not present

## 2016-08-25 ENCOUNTER — Other Ambulatory Visit: Payer: Self-pay | Admitting: Pain Medicine

## 2016-08-25 DIAGNOSIS — M545 Low back pain: Secondary | ICD-10-CM

## 2016-09-04 ENCOUNTER — Ambulatory Visit
Admission: RE | Admit: 2016-09-04 | Discharge: 2016-09-04 | Disposition: A | Discharge status: Home / Self Care | Payer: Medicare Other | Source: Ambulatory Visit | Attending: Pain Medicine | Admitting: Pain Medicine

## 2016-09-04 DIAGNOSIS — M47896 Other spondylosis, lumbar region: Secondary | ICD-10-CM | POA: Diagnosis not present

## 2016-09-04 DIAGNOSIS — M545 Low back pain: Secondary | ICD-10-CM | POA: Diagnosis present

## 2016-10-02 DIAGNOSIS — A6000 Herpesviral infection of urogenital system, unspecified: Secondary | ICD-10-CM | POA: Insufficient documentation

## 2016-10-06 ENCOUNTER — Other Ambulatory Visit: Payer: Self-pay | Admitting: Family Medicine

## 2016-10-06 DIAGNOSIS — Z780 Asymptomatic menopausal state: Secondary | ICD-10-CM

## 2016-11-18 ENCOUNTER — Encounter: Payer: Self-pay | Admitting: *Deleted

## 2016-11-19 ENCOUNTER — Ambulatory Visit: Payer: Medicare Other | Admitting: Anesthesiology

## 2016-11-19 ENCOUNTER — Ambulatory Visit
Admission: RE | Admit: 2016-11-19 | Discharge: 2016-11-19 | Disposition: A | Discharge status: Home / Self Care | Payer: Medicare Other | Source: Ambulatory Visit | Attending: Unknown Physician Specialty | Admitting: Unknown Physician Specialty

## 2016-11-19 ENCOUNTER — Encounter: Payer: Self-pay | Admitting: Anesthesiology

## 2016-11-19 ENCOUNTER — Encounter
Admission: RE | Disposition: A | Discharge status: Home / Self Care | Payer: Self-pay | Source: Ambulatory Visit | Attending: Unknown Physician Specialty

## 2016-11-19 DIAGNOSIS — M1990 Unspecified osteoarthritis, unspecified site: Secondary | ICD-10-CM | POA: Diagnosis not present

## 2016-11-19 DIAGNOSIS — Z79899 Other long term (current) drug therapy: Secondary | ICD-10-CM | POA: Insufficient documentation

## 2016-11-19 DIAGNOSIS — M797 Fibromyalgia: Secondary | ICD-10-CM | POA: Insufficient documentation

## 2016-11-19 DIAGNOSIS — K219 Gastro-esophageal reflux disease without esophagitis: Secondary | ICD-10-CM | POA: Diagnosis not present

## 2016-11-19 DIAGNOSIS — K2270 Barrett's esophagus without dysplasia: Secondary | ICD-10-CM | POA: Diagnosis not present

## 2016-11-19 DIAGNOSIS — K220 Achalasia of cardia: Secondary | ICD-10-CM | POA: Insufficient documentation

## 2016-11-19 DIAGNOSIS — J439 Emphysema, unspecified: Secondary | ICD-10-CM | POA: Diagnosis not present

## 2016-11-19 DIAGNOSIS — Z87891 Personal history of nicotine dependence: Secondary | ICD-10-CM | POA: Insufficient documentation

## 2016-11-19 DIAGNOSIS — R1310 Dysphagia, unspecified: Secondary | ICD-10-CM | POA: Diagnosis present

## 2016-11-19 DIAGNOSIS — F329 Major depressive disorder, single episode, unspecified: Secondary | ICD-10-CM | POA: Diagnosis not present

## 2016-11-19 HISTORY — DX: Unspecified osteoarthritis, unspecified site: M19.90

## 2016-11-19 HISTORY — DX: Depression, unspecified: F32.A

## 2016-11-19 HISTORY — DX: Major depressive disorder, single episode, unspecified: F32.9

## 2016-11-19 HISTORY — DX: Herpesviral infection of urogenital system, unspecified: A60.00

## 2016-11-19 HISTORY — DX: Fibromyalgia: M79.7

## 2016-11-19 HISTORY — DX: Personal history of urinary calculi: Z87.442

## 2016-11-19 HISTORY — DX: Personal history of other infectious and parasitic diseases: Z86.19

## 2016-11-19 HISTORY — DX: Achalasia of cardia: K22.0

## 2016-11-19 HISTORY — PX: ESOPHAGOGASTRODUODENOSCOPY (EGD) WITH PROPOFOL: SHX5813

## 2016-11-19 SURGERY — ESOPHAGOGASTRODUODENOSCOPY (EGD) WITH PROPOFOL
Anesthesia: General

## 2016-11-19 MED ORDER — PROPOFOL 500 MG/50ML IV EMUL
INTRAVENOUS | Status: DC | PRN
  Administered 2016-11-19: 125 ug/kg/min via INTRAVENOUS

## 2016-11-19 MED ORDER — ONDANSETRON HCL 4 MG/2ML IJ SOLN: 4.0000 mg | Freq: Once | INTRAMUSCULAR | Status: DC | PRN

## 2016-11-19 MED ORDER — FENTANYL CITRATE (PF) 100 MCG/2ML IJ SOLN
INTRAMUSCULAR | Status: AC
  Filled 2016-11-19: qty 2

## 2016-11-19 MED ORDER — LIDOCAINE HCL (PF) 1 % IJ SOLN
2.0000 mL | Freq: Once | INTRAMUSCULAR | Status: AC
  Administered 2016-11-19: 0.3 mL via INTRADERMAL

## 2016-11-19 MED ORDER — PROPOFOL 10 MG/ML IV BOLUS
INTRAVENOUS | Status: DC | PRN
  Administered 2016-11-19: 50 mg via INTRAVENOUS

## 2016-11-19 MED ORDER — FENTANYL CITRATE (PF) 100 MCG/2ML IJ SOLN: 25.0000 ug | INTRAMUSCULAR | Status: DC | PRN

## 2016-11-19 MED ORDER — SODIUM CHLORIDE 0.9 % IV SOLN
INTRAVENOUS | Status: DC
  Administered 2016-11-19: 1000 mL via INTRAVENOUS

## 2016-11-19 MED ORDER — SODIUM CHLORIDE 0.9 % IV SOLN: INTRAVENOUS | Status: DC

## 2016-11-19 MED ORDER — LIDOCAINE HCL (CARDIAC) 20 MG/ML IV SOLN
INTRAVENOUS | Status: DC | PRN
  Administered 2016-11-19: 25 mg via INTRAVENOUS

## 2016-11-19 MED ORDER — LIDOCAINE HCL (PF) 1 % IJ SOLN
INTRAMUSCULAR | Status: AC
  Administered 2016-11-19: 0.3 mL via INTRADERMAL
  Filled 2016-11-19: qty 2

## 2016-11-19 MED ORDER — FENTANYL CITRATE (PF) 100 MCG/2ML IJ SOLN
INTRAMUSCULAR | Status: DC | PRN
  Administered 2016-11-19: 50 ug via INTRAVENOUS

## 2016-11-19 NOTE — H&P (Signed)
Primary Care Physician:  [name], MD Primary Gastroenterologist:  Dr. Vira Agar  Pre-Procedure History & Physical: HPI:  Katrina Simon is a 66 y.o. female is here for an endoscopy.   Past Medical History:  Diagnosis Date  . Achalasia   . Allergy   . Arthritis   . Depression   . Emphysema of lung ([place])   . Fibromyalgia   . GERD (gastroesophageal reflux disease)   . Herpes genitalis   . History of chicken pox   . History of kidney stones     Past Surgical History:  Procedure Laterality Date  . CESAREAN SECTION    . LAPAROSCOPY N/A    done at [facility] to release esophageal muscles    Prior to Admission medications   Medication Sig Start Date End Date Taking? Authorizing Provider  albuterol (PROVENTIL HFA;VENTOLIN HFA) 108 (90 Base) MCG/ACT inhaler Inhale into the lungs every 6 (six) hours as needed for wheezing or shortness of breath.   Yes [provider]  fluticasone (FLONASE) 50 MCG/ACT nasal spray Place 1 spray into both nostrils daily.   Yes [provider]  HYDROcodone-acetaminophen (NORCO) 10-325 MG tablet Take 1 tablet by mouth daily.   Yes [provider]  ALPRAZolam Duanne Moron) 1 MG tablet Take 1 mg by mouth 2 (two) times daily as needed for anxiety. Reported on 10/04/2015    [provider]  Amphetamine ER (ADZENYS XR-ODT) 12.5 MG TBED Take 12.5 mg by mouth daily. Reported on 10/04/2015    [provider]  bisacodyl (DULCOLAX) 10 MG suppository Place rectally. Reported on 10/04/2015 08/28/13   [provider]  cefUROXime (CEFTIN) 250 MG tablet Take 1 tablet (250 mg total) by mouth 2 (two) times daily with a meal. Patient not taking: Reported on 11/18/2016 02/19/15   Mohammed Kindle, MD  dexmethylphenidate (FOCALIN) 10 MG tablet Take 40 mg by mouth as needed. Reported on 10/04/2015 02/17/14   [provider]  dexmethylphenidate (FOCALIN) 5 MG tablet Take 5 mg by mouth every morning. Reported on 10/04/2015     [provider]  diazepam (VALIUM) 5 MG tablet Take 5 mg by mouth as needed. Reported on 10/04/2015    [provider]  levofloxacin (LEVAQUIN) 250 MG tablet Take 1 tablet (250 mg total) by mouth daily. Patient not taking: Reported on 10/04/2015 02/19/15   Mohammed Kindle, MD  levofloxacin [facility]) 250 MG tablet Limit 1 tablet by mouth per day if tolerated Patient not taking: Reported on 10/04/2015 09/24/15   Mohammed Kindle, MD  levofloxacin (LEVAQUIN) 250 MG tablet Take 1 tablet (250 mg total) by mouth daily. Patient not taking: Reported on 11/18/2016 10/10/15   Mohammed Kindle, MD  OLANZapine-FLUoxetine [facility]) 6-50 MG per capsule Take 1 capsule by mouth at bedtime. Reported on 09/06/2015    [provider]  omeprazole (PRILOSEC) 40 MG capsule Take by mouth. Reported on 10/04/2015    [provider]  oxyCODONE (OXYCONTIN) 10 mg 12 hr tablet Limit 1-2 tabs by mouth every 12 hours if tolerated 11/01/15   Mohammed Kindle, MD  oxyCODONE (ROXICODONE) 5 MG immediate release tablet Limit 3-4 tabs by mouth per day for breakthrough pain while taking OxyContin if tolerated 11/01/15   Mohammed Kindle, MD  pantoprazole (PROTONIX) 40 MG tablet Take by mouth. 01/31/14 01/31/15  [provider]  traMADol (ULTRAM) 50 MG tablet Take 50 mg by mouth as needed. Reported on 10/04/2015 03/03/14   [provider]  zolpidem (AMBIEN) 5 MG tablet  Take 5 mg by mouth at bedtime.     [provider]    Allergies as of 08/26/2016 - Review Complete 11/01/2015  Allergen Reaction Noted  . Aspirin Nausea And Vomiting 07/18/2014  . Penicillins Hives 07/18/2014  . Statins  07/18/2014    Family History  Problem Relation Age of Onset  . Arthritis Mother   . Heart disease Father   . Stroke Father     Social History   Social History  . Marital status: Married    Spouse name: N/A  . Number of children: N/A  . Years of education: N/A   Occupational History  . Not  on file.   Social History Main Topics  . Smoking status: Former Smoker    Packs/day: 0.50    Years: 45.00    Types: Cigarettes    Quit date: 09/16/2012  . Smokeless tobacco: Never Used  . Alcohol use No  . Drug use: No  . Sexual activity: Yes    Birth control/ protection: Post-menopausal   Other Topics Concern  . Not on file   Social History Narrative  . No narrative on file    Review of Systems: See HPI, otherwise negative ROS  Physical Exam: BP 132/65   Pulse 63   Temp (!) 97.5 F (36.4 C) (Tympanic)   Resp 17   Ht 5\' 4"  (1.626 m)   Wt 76.2 kg (168 lb)   LMP  (LMP Unknown)   SpO2 97%   BMI 28.84 kg/m  General:   Alert,  pleasant and cooperative in NAD Head:  Normocephalic and atraumatic. Neck:  Supple; no masses or thyromegaly. Lungs:  Clear throughout to auscultation.    Heart:  Regular rate and rhythm. Abdomen:  Soft, nontender and nondistended. Normal bowel sounds, without guarding, and without rebound.   Neurologic:  Alert and  oriented x4;  grossly normal neurologically.  Impression/Plan: Katrina Simon is here for an endoscopy to be performed for dysphagia and previous achalasia  Risks, benefits, limitations, and alternatives regarding  endoscopy have been reviewed with the patient.  Questions have been answered.  All parties agreeable.   Gaylyn Cheers, MD  11/19/2016, 10:05 AM

## 2016-11-19 NOTE — Op Note (Signed)
[facility] Gastroenterology Patient Name: Katrina Simon Procedure Date: 11/19/2016 10:07 AM MRN: [id number] Account #: [id number] Date of Birth: 1950/06/28 Admit Type: Outpatient Age: 66 Room: [facility] ENDO ROOM 3 Gender: Female Note Status: Finalized Procedure:            Upper GI endoscopy Indications:          Dysphagia Providers:            Manya Silvas, MD Referring MD:         Rubbie Battiest. Iona Beard MD, MD (Referring MD) Medicines:            Propofol per Anesthesia Complications:        No immediate complications. Procedure:            Pre-Anesthesia Assessment:                       - After reviewing the risks and benefits, the patient                        was deemed in satisfactory condition to undergo the                        procedure.                       After obtaining informed consent, the endoscope was                        passed under direct vision. Throughout the procedure,                        the patient's blood pressure, pulse, and oxygen                        saturations were monitored continuously. The Endoscope                        was introduced through the mouth, and advanced to the                        second part of duodenum. The upper GI endoscopy was                        accomplished without difficulty. The patient tolerated                        the procedure well. Findings:      There were esophageal mucosal changes suspicious for short-segment       Barrett's esophagus present in the distal esophagus. The maximum       longitudinal extent of these mucosal changes was 2 cm in length. Mucosa       was biopsied with a cold forceps for histology. One specimen bottle was       sent to pathology.      The entire examined stomach was normal. Retroflexed view showed partial       wrap with no signif narrowing.      The examined duodenum was normal. Impression:           - Esophageal mucosal changes suspicious for             short-segment  Barrett's esophagus. Biopsied.                       - Normal stomach.                       - Normal examined duodenum. Recommendation:       - The findings and recommendations were discussed with                        the patient's family. Manya Silvas, MD 11/19/2016 10:25:27 AM This report has been signed electronically. Number of Addenda: 0 Note Initiated On: 11/19/2016 10:07 AM      [facility]

## 2016-11-19 NOTE — Anesthesia Post-op Follow-up Note (Signed)
Anesthesia QCDR form completed.        

## 2016-11-19 NOTE — Anesthesia Preprocedure Evaluation (Addendum)
Anesthesia Evaluation  Patient identified by MRN, date of birth, ID band Patient awake    Reviewed: Allergy & Precautions, NPO status , Patient's Chart, lab work & pertinent test results  Airway Mallampati: II  TM Distance: >3 FB     Dental  (+) Upper Dentures, Lower Dentures   Pulmonary COPD,  COPD inhaler, former smoker,    Pulmonary exam normal        Cardiovascular negative cardio ROS Normal cardiovascular exam     Neuro/Psych PSYCHIATRIC DISORDERS Depression    GI/Hepatic Neg liver ROS, GERD  ,  Endo/Other  negative endocrine ROS  Renal/GU negative Renal ROS     Musculoskeletal  (+) Arthritis , Osteoarthritis,  Fibromyalgia -  Abdominal Normal abdominal exam  (+)   Peds  Hematology negative hematology ROS (+)   Anesthesia Other Findings   Reproductive/Obstetrics                            Anesthesia Physical Anesthesia Plan  ASA: III  Anesthesia Plan: General   Post-op Pain Management:    Induction: Intravenous  PONV Risk Score and Plan:   Airway Management Planned: Nasal Cannula  Additional Equipment:   Intra-op Plan:   Post-operative Plan:   Informed Consent: I have reviewed the patients History and Physical, chart, labs and discussed the procedure including the risks, benefits and alternatives for the proposed anesthesia with the patient or authorized representative who has indicated his/her understanding and acceptance.   Dental advisory given  Plan Discussed with: CRNA and Surgeon  Anesthesia Plan Comments:         Anesthesia Quick Evaluation

## 2016-11-19 NOTE — Transfer of Care (Signed)
Immediate Anesthesia Transfer of Care Note  Patient: Katrina Simon  Procedure(s) Performed: Procedure(s): ESOPHAGOGASTRODUODENOSCOPY (EGD) WITH PROPOFOL (N/A)  Patient Location: PACU and Endoscopy Unit  Anesthesia Type:General  Level of Consciousness: awake  Airway & Oxygen Therapy: Patient Spontanous Breathing  Post-op Assessment: Report given to RN  Post vital signs: stable  Last Vitals:  Vitals:   11/19/16 0931 11/19/16 1029  BP: 132/65 114/65  Pulse: 63 70  Resp: 17 18  Temp: (!) 36.4 C 36.9 C  SpO2: 97% 97%    Last Pain:  Vitals:   11/19/16 1029  TempSrc: Tympanic         Complications: No apparent anesthesia complications

## 2016-11-19 NOTE — Anesthesia Postprocedure Evaluation (Signed)
Anesthesia Post Note  Patient: Katrina Simon  Procedure(s) Performed: Procedure(s) (LRB): ESOPHAGOGASTRODUODENOSCOPY (EGD) WITH PROPOFOL (N/A)  Patient location during evaluation: PACU Anesthesia Type: General Level of consciousness: awake and alert and oriented Pain management: pain level controlled Vital Signs Assessment: post-procedure vital signs reviewed and stable Respiratory status: spontaneous breathing Cardiovascular status: blood pressure returned to baseline Anesthetic complications: no     Last Vitals:  Vitals:   11/19/16 1049 11/19/16 1059  BP: 135/67 134/69  Pulse: 69 71  Resp: 18 14  Temp:    SpO2: 99% 98%    Last Pain:  Vitals:   11/19/16 1029  TempSrc: Tympanic                 [name]

## 2016-11-20 ENCOUNTER — Encounter: Payer: Self-pay | Admitting: Unknown Physician Specialty

## 2016-11-20 LAB — SURGICAL PATHOLOGY

## 2017-08-19 ENCOUNTER — Emergency Department
Admission: EM | Admit: 2017-08-19 | Discharge: 2017-08-19 | Disposition: A | Discharge status: Home / Self Care | Payer: Medicare Other | Attending: Emergency Medicine | Admitting: Emergency Medicine

## 2017-08-19 ENCOUNTER — Encounter: Payer: Self-pay | Admitting: Emergency Medicine

## 2017-08-19 ENCOUNTER — Other Ambulatory Visit: Payer: Self-pay

## 2017-08-19 DIAGNOSIS — Z87891 Personal history of nicotine dependence: Secondary | ICD-10-CM | POA: Diagnosis not present

## 2017-08-19 DIAGNOSIS — R1310 Dysphagia, unspecified: Secondary | ICD-10-CM

## 2017-08-19 MED ORDER — GLUCAGON HCL RDNA (DIAGNOSTIC) 1 MG IJ SOLR
1.0000 mg | Freq: Once | INTRAMUSCULAR | Status: AC
  Administered 2017-08-19: 1 mg via INTRAMUSCULAR

## 2017-08-19 MED ORDER — GLUCAGON HCL RDNA (DIAGNOSTIC) 1 MG IJ SOLR
INTRAMUSCULAR | Status: AC
  Administered 2017-08-19: 1 mg via INTRAMUSCULAR
  Filled 2017-08-19: qty 1

## 2017-08-19 NOTE — ED Triage Notes (Signed)
Pt in via POV with complaints of having "something stuck" pointing to mid sternum area since last night.  Pt states she thinks it is a piece of steak.  Pt states she is unable to take liquids without them coming back up.  Pt with hx of severe reflux.  Pt A/Ox4, vitals WDL.

## 2017-08-19 NOTE — ED Notes (Signed)
Informed RN that patient has been roomed and is ready for evaluation.  Patient in NAD at this time and call bell placed within reach.   

## 2017-08-19 NOTE — ED Provider Notes (Signed)
[facility] Emergency Department Provider Note       Time seen: ----------------------------------------- 1:57 PM on 08/19/2017 -----------------------------------------   I have reviewed the triage vital signs and the nursing notes.  HISTORY   Chief Complaint Esophogeal Stricture    HPI Katrina Simon is a 67 y.o. female with a history of achalasia who presents to the ED for difficulty swallowing.  Patient felt like something was stuck in her esophagus.  She reports she was eating steak last night and felt like it got stuck.  She was unable to tolerate liquids after that.  She does have a history of severe reflux and sees Dr. Vira Agar.  She denies any other complaints at this time.  Past Medical History:  Diagnosis Date  . Achalasia   . Allergy   . Arthritis   . Depression   . Emphysema of lung ([place])   . Fibromyalgia   . GERD (gastroesophageal reflux disease)   . Herpes genitalis   . History of chicken pox   . History of kidney stones     Patient Active Problem List   Diagnosis Date Noted  . Chronic pain syndrome 12/06/2014  . Personal history of urinary calculi 12/06/2014  . Esophageal reflux 12/06/2014  . Facet syndrome, lumbar 09/21/2014  . DJD of shoulder 08/23/2014  . DDD (degenerative disc disease), lumbar 07/18/2014  . DDD (degenerative disc disease), thoracolumbar 07/18/2014  . DJD (degenerative joint disease) of knee 07/18/2014  . DJD (degenerative joint disease) shoulder, sacroiliac joint  07/18/2014  . CAFL (chronic airflow limitation) ([place]) 08/24/2013  . Clinical depression 08/24/2013  . Fibromyalgia 08/24/2013  . Arthritis, degenerative 08/18/2013  . Achalasia 06/29/2013  . Adenomatous colon polyp 05/10/2013  . Anxiety disorder 12/29/2006  . Benign paroxysmal positional nystagmus 12/01/2005  . Hypotension 07/04/2005  . Generalized osteoarthrosis, involving multiple sites 03/17/1997    Past Surgical History:  Procedure  Laterality Date  . CESAREAN SECTION    . ESOPHAGOGASTRODUODENOSCOPY (EGD) WITH PROPOFOL N/A 11/19/2016   Procedure: ESOPHAGOGASTRODUODENOSCOPY (EGD) WITH PROPOFOL;  Surgeon: Manya Silvas, MD;  Location: [facility] ENDOSCOPY;  Service: Endoscopy;  Laterality: N/A;  . LAPAROSCOPY N/A    done at [facility] to release esophageal muscles    Allergies Penicillins; Aspirin; and Statins  Social History Social History   Tobacco Use  . Smoking status: Former Smoker    Packs/day: 0.50    Years: 45.00    Pack years: 22.50    Types: Cigarettes    Last attempt to quit: 09/16/2012    Years since quitting: 4.9  . Smokeless tobacco: Never Used  Substance Use Topics  . Alcohol use: No    Alcohol/week: 0.0 oz  . Drug use: No   Review of Systems Constitutional: Negative for fever. ENT: Positive for dysphagia Cardiovascular: Negative for chest pain. Respiratory: Negative for shortness of breath. Gastrointestinal: Negative for abdominal pain, vomiting and diarrhea. Musculoskeletal: Negative for back pain. Neurological: Negative for headaches, focal weakness or numbness.  All systems negative/normal/unremarkable except as stated in the HPI  ____________________________________________   PHYSICAL EXAM:  VITAL SIGNS: ED Triage Vitals  Enc Vitals Group     BP 08/19/17 1157 (!) 139/58     Pulse Rate 08/19/17 1157 71     Resp 08/19/17 1157 16     Temp 08/19/17 1157 99 F (37.2 C)     Temp Source 08/19/17 1157 Oral     SpO2 08/19/17 1157 96 %     Weight 08/19/17 1158  156 lb (70.8 kg)     Height 08/19/17 1158 5\' 5"  (1.651 m)     Head Circumference --      Peak Flow --      Pain Score 08/19/17 1157 3     Pain Loc --      Pain Edu? --      Excl. in [place]? --    Constitutional: Alert and oriented. Well appearing and in no distress. Eyes: Conjunctivae are normal. Normal extraocular movements. ENT   Head: Normocephalic and atraumatic.   Nose: No congestion/rhinnorhea.    Mouth/Throat: Mucous membranes are moist.   Neck: No stridor. Cardiovascular: Normal rate, regular rhythm. No murmurs, rubs, or gallops. Respiratory: Normal respiratory effort without tachypnea nor retractions. Breath sounds are clear and equal bilaterally. No wheezes/rales/rhonchi. Gastrointestinal: Soft and nontender. Normal bowel sounds Musculoskeletal: Nontender with normal range of motion in extremities. No lower extremity tenderness nor edema. Neurologic:  Normal speech and language. No gross focal neurologic deficits are appreciated.  Skin:  Skin is warm, dry and intact. No rash noted. ___________________________________________  ED COURSE:  As part of my medical decision making, I reviewed the following data within the [place] History obtained from family if available, nursing notes, old chart and ekg, as well as notes from prior ED visits. Patient presented for difficulty swallowing, at the time of my evaluation the esophageal impaction had resolved.  She had received some glucagon prior to my evaluation.   Procedures  ____________________________________________  DIFFERENTIAL DIAGNOSIS   Esophageal food impaction, achalasia, GERD, stricture  FINAL ASSESSMENT AND PLAN  Dysphagia  Plan: The patient had presented for esophageal food impaction which has resolved.  Patient is able to tolerate liquids well and has no trouble swallowing.  She is cleared for outpatient follow-up.   Laurence Aly, MD   Note: This note was generated in part or whole with voice recognition software. Voice recognition is usually quite accurate but there are transcription errors that can and very often do occur. I apologize for any typographical errors that were not detected and corrected.     Earleen Newport, MD 08/19/17 [phone]

## 2017-08-19 NOTE — ED Notes (Signed)
Pt presentation discussed with EDP; see new orders.

## 2018-11-08 ENCOUNTER — Other Ambulatory Visit: Payer: Self-pay | Admitting: Family Medicine

## 2018-11-08 DIAGNOSIS — Z1231 Encounter for screening mammogram for malignant neoplasm of breast: Secondary | ICD-10-CM

## 2018-11-08 DIAGNOSIS — Z780 Asymptomatic menopausal state: Secondary | ICD-10-CM

## 2018-12-29 ENCOUNTER — Ambulatory Visit (INDEPENDENT_AMBULATORY_CARE_PROVIDER_SITE_OTHER): Payer: Medicare Other

## 2018-12-29 ENCOUNTER — Encounter: Payer: Self-pay | Admitting: Podiatry

## 2018-12-29 ENCOUNTER — Other Ambulatory Visit: Payer: Self-pay

## 2018-12-29 ENCOUNTER — Ambulatory Visit: Payer: Medicare Other | Admitting: Podiatry

## 2018-12-29 ENCOUNTER — Other Ambulatory Visit: Payer: Self-pay | Admitting: Podiatry

## 2018-12-29 ENCOUNTER — Telehealth: Payer: Self-pay | Admitting: Podiatry

## 2018-12-29 VITALS — BP 148/85 | HR 71 | Resp 16

## 2018-12-29 DIAGNOSIS — M2012 Hallux valgus (acquired), left foot: Secondary | ICD-10-CM

## 2018-12-29 DIAGNOSIS — M2011 Hallux valgus (acquired), right foot: Secondary | ICD-10-CM

## 2018-12-29 NOTE — Patient Instructions (Signed)
Pre-Operative Instructions  Congratulations, you have decided to take an important step towards improving your quality of life.  You can be assured that the doctors and staff at [facility] will be with you every step of the way.  Here are some important things you should know:  1. Plan to be at the surgery center/hospital at least 1 (one) hour prior to your scheduled time, unless otherwise directed by the surgical center/hospital staff.  You must have a responsible adult accompany you, remain during the surgery and drive you home.  Make sure you have directions to the surgical center/hospital to ensure you arrive on time. 2. If you are having surgery at [facility] or [facility] hospitals, you will need a copy of your medical history and physical form from your family physician within one month prior to the date of surgery. We will give you a form for your primary physician to complete.  3. We make every effort to accommodate the date you request for surgery.  However, there are times where surgery dates or times have to be moved.  We will contact you as soon as possible if a change in schedule is required.   4. No aspirin/ibuprofen for one week before surgery.  If you are on aspirin, any non-steroidal anti-inflammatory medications (Mobic, Aleve, Ibuprofen) should not be taken seven (7) days prior to your surgery.  You make take Tylenol for pain prior to surgery.  5. Medications - If you are taking daily heart and blood pressure medications, seizure, reflux, allergy, asthma, anxiety, pain or diabetes medications, make sure you notify the surgery center/hospital before the day of surgery so they can tell you which medications you should take or avoid the day of surgery. 6. No food or drink after midnight the night before surgery unless directed otherwise by surgical center/hospital staff. 7. No alcoholic beverages 24-hours prior to surgery.  No smoking 24-hours prior or 24-hours after  surgery. 8. Wear loose pants or shorts. They should be loose enough to fit over bandages, boots, and casts. 9. Don't wear slip-on shoes. Sneakers are preferred. 10. Bring your boot with you to the surgery center/hospital.  Also bring crutches or a walker if your physician has prescribed it for you.  If you do not have this equipment, it will be provided for you after surgery. 11. If you have not been contacted by the surgery center/hospital by the day before your surgery, call to confirm the date and time of your surgery. 12. Leave-time from work may vary depending on the type of surgery you have.  Appropriate arrangements should be made prior to surgery with your employer. 13. Prescriptions will be provided immediately following surgery by your doctor.  Fill these as soon as possible after surgery and take the medication as directed. Pain medications will not be refilled on weekends and must be approved by the doctor. 14. Remove nail polish on the operative foot and avoid getting pedicures prior to surgery. 15. Wash the night before surgery.  The night before surgery wash the foot and leg well with water and the antibacterial soap provided. Be sure to pay special attention to beneath the toenails and in between the toes.  Wash for at least three (3) minutes. Rinse thoroughly with water and dry well with a towel.  Perform this wash unless told not to do so by your physician.  Enclosed: 1 Ice pack (please put in freezer the night before surgery)   1 Hibiclens skin cleaner     Pre-op instructions  If you have any questions regarding the instructions, please do not hesitate to call our office.  [place]: [address], , [place] [address] -- [phone]  [place]: [address] [place], [place] [address] -- [phone]  [place]: [address]  [place], [place] [address] -- [phone]   Website: [email or web address] 

## 2018-12-29 NOTE — Telephone Encounter (Signed)
I was reading over my paperwork but I do not see a date on a Friday for my surgery with Dr. Milinda Pointer. He told me to choose a Friday that worked for me.

## 2018-12-29 NOTE — Telephone Encounter (Signed)
Called pt and told her I could not get her scheduled for surgery as I'm in the [facility] office and would get her consent forms tomorrow since she was just seen today. I told her I could tentatively schedule her for Friday 02/04/2019 and call her back tomorrow to confirm that date. Pt stated that date looked fine and she would wait to hear from me tomorrow.

## 2018-12-29 NOTE — Progress Notes (Signed)
Subjective:  Patient ID: Katrina Simon, female    DOB: 1950/10/02,  MRN: [id number] HPI Chief Complaint  Patient presents with  . Foot Pain    1st MPJ left - aching x several months, deformity x years, redness, shoes uncomfortable, having to buy larger and wider shoes  . New Patient (Initial Visit)    68 y.o. female presents with the above complaint.   ROS: Denies fever chills nausea vomiting muscle aches pains calf pain back pain chest pain shortness of breath.  Past Medical History:  Diagnosis Date  . Achalasia   . Allergy   . Arthritis   . Depression   . Emphysema of lung ([place])   . Fibromyalgia   . GERD (gastroesophageal reflux disease)   . Herpes genitalis   . History of chicken pox   . History of kidney stones    Past Surgical History:  Procedure Laterality Date  . CESAREAN SECTION    . ESOPHAGOGASTRODUODENOSCOPY (EGD) WITH PROPOFOL N/A 11/19/2016   Procedure: ESOPHAGOGASTRODUODENOSCOPY (EGD) WITH PROPOFOL;  Surgeon: Manya Silvas, MD;  Location: [facility] ENDOSCOPY;  Service: Endoscopy;  Laterality: N/A;  . LAPAROSCOPY N/A    done at [facility] to release esophageal muscles    Current Outpatient Medications:  .  Budesonide-Formoterol Fumarate (SYMBICORT IN), Inhale into the lungs., Disp: , Rfl:  .  albuterol (PROVENTIL HFA;VENTOLIN HFA) 108 (90 Base) MCG/ACT inhaler, Inhale into the lungs every 6 (six) hours as needed for wheezing or shortness of breath., Disp: , Rfl:  .  dexmethylphenidate (FOCALIN) 10 MG tablet, Take 40 mg by mouth as needed. Reported on 10/04/2015, Disp: , Rfl:  .  FLUoxetine (PROZAC) 40 MG capsule, , Disp: , Rfl:  .  fluticasone (FLONASE) 50 MCG/ACT nasal spray, Place 1 spray into both nostrils daily., Disp: , Rfl:  .  MIRALAX 17 GM/SCOOP powder, TAKE AS DIRECTED FOR COLONIC PREP., Disp: , Rfl:  .  OLANZapine (ZYPREXA) 5 MG tablet, , Disp: , Rfl:  .  oxybutynin (DITROPAN-XL) 5 MG 24 hr tablet, TAKE 1 TABLET (5 MG TOTAL) BY MOUTH ONCE DAILY FOR  99 DAYS PATIENT NEEDS TO SCHEDULE OFFICE VISIT., Disp: , Rfl:  .  oxyCODONE (OXYCONTIN) 10 mg 12 hr tablet, Limit 1-2 tabs by mouth every 12 hours if tolerated, Disp: 90 tablet, Rfl: 0 .  oxyCODONE (ROXICODONE) 5 MG immediate release tablet, Limit 3-4 tabs by mouth per day for breakthrough pain while taking OxyContin if tolerated, Disp: 100 tablet, Rfl: 0 .  pantoprazole (PROTONIX) 40 MG tablet, Take by mouth., Disp: , Rfl:  .  zolpidem (AMBIEN) 5 MG tablet, Take 5 mg by mouth at bedtime. , Disp: , Rfl:   Current Facility-Administered Medications:  .  bupivacaine (PF) (MARCAINE) 0.25 % injection 30 mL, 30 mL, Other, Once, Mohammed Kindle, MD .  bupivacaine (PF) (MARCAINE) 0.25 % injection 30 mL, 30 mL, Other, Once, Mohammed Kindle, MD .  bupivacaine (PF) (MARCAINE) 0.25 % injection 30 mL, 30 mL, Other, Once, Mohammed Kindle, MD .  fentaNYL (SUBLIMAZE) injection 100 mcg, 100 mcg, Intravenous, Once, Mohammed Kindle, MD .  fentaNYL (SUBLIMAZE) injection 100 mcg, 100 mcg, Intravenous, Once, Mohammed Kindle, MD .  lactated ringers infusion 1,000 mL, 1,000 mL, Intravenous, Continuous, Mohammed Kindle, MD .  lactated ringers infusion 1,000 mL, 1,000 mL, Intravenous, Continuous, Mohammed Kindle, MD .  lactated ringers infusion 1,000 mL, 1,000 mL, Intravenous, Continuous, Mohammed Kindle, MD, Last Rate: 125 mL/hr at 09/24/15 0826, 1,000 mL at 09/24/15 0826 .  lactated ringers infusion 1,000 mL, 1,000 mL, Intravenous, Continuous, Mohammed Kindle, MD .  Levofloxacin (LEVAQUIN) IVPB 250 mg, 250 mg, Intravenous, Once, Mohammed Kindle, MD .  lidocaine (PF) (XYLOCAINE) 1 % injection 10 mL, 10 mL, Subcutaneous, Once, Mohammed Kindle, MD .  lidocaine (PF) (XYLOCAINE) 1 % injection 10 mL, 10 mL, Subcutaneous, Once, Mohammed Kindle, MD .  midazolam (VERSED) 5 MG/5ML injection 5 mg, 5 mg, Intravenous, Once, Mohammed Kindle, MD .  midazolam (VERSED) 5 MG/5ML injection 5 mg, 5 mg, Intravenous, Once, Mohammed Kindle, MD .   midazolam (VERSED) 5 MG/5ML injection 5 mg, 5 mg, Intravenous, Once, Mohammed Kindle, MD .  orphenadrine (NORFLEX) injection 60 mg, 60 mg, Intramuscular, Once, Mohammed Kindle, MD .  sodium chloride 0.9 % injection 20 mL, 20 mL, Other, Once, Mohammed Kindle, MD .  sodium chloride flush (NS) 0.9 % injection 20 mL, 20 mL, Other, Once, Mohammed Kindle, MD .  triamcinolone acetonide (KENALOG-40) injection 40 mg, 40 mg, Other, Once, Mohammed Kindle, MD .  triamcinolone acetonide (KENALOG-40) injection 40 mg, 40 mg, Other, Once, Mohammed Kindle, MD .  triamcinolone acetonide (KENALOG-40) injection 40 mg, 40 mg, Other, Once, Mohammed Kindle, MD .  triamcinolone acetonide (KENALOG-40) injection 40 mg, 40 mg, Other, Once, Mohammed Kindle, MD  Allergies  Allergen Reactions  . Penicillins Hives  . Aspirin Nausea And Vomiting  . Statins Other (See Comments)    Myalgia   Review of Systems Objective:   Vitals:   12/29/18 1006  BP: (!) 148/85  Pulse: 71  Resp: 16    General: Well developed, nourished, in no acute distress, alert and oriented x3   Dermatological: Skin is warm, dry and supple bilateral. Nails x 10 are well maintained; remaining integument appears unremarkable at this time. There are no open sores, no preulcerative lesions, no rash or signs of infection present.  Vascular: Dorsalis Pedis artery and Posterior Tibial artery pedal pulses are 2/4 bilateral with immedate capillary fill time. Pedal hair growth present. No varicosities and no lower extremity edema present bilateral.   Neruologic: Grossly intact via light touch bilateral. Vibratory intact via tuning fork bilateral. Protective threshold with Semmes Wienstein monofilament intact to all pedal sites bilateral. Patellar and Achilles deep tendon reflexes 2+ bilateral. No Babinski or clonus noted bilateral.   Musculoskeletal: No gross boney pedal deformities bilateral. No pain, crepitus, or limitation noted with foot and ankle range of  motion bilateral. Muscular strength 5/5 in all groups tested bilateral.  Gait: Unassisted, Nonantalgic.    Radiographs:  Radiographs taken today demonstrate an increase in the first and metatarsal angle greater than 15 degrees hallux abductus angle dislocating at greater than 15 degrees.  There is some dorsiflexion noted of the first metatarsal as well.  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  Plan: Discussed etiology pathology conservative surgical therapies at this point we discussed surgery with a Lapidus procedure and a bunionectomy and as well as a cast.  She understands this and is amenable to it.  We did discuss the possible postop complications which may include but not limited to postop pain bleeding swelling infection recurrence need for further surgery overcorrection under correction loss of digit loss of limb loss of life.  Follow-up with him in the near future for surgical intervention.     Max T. [place], [place]

## 2018-12-30 NOTE — Telephone Encounter (Signed)
Called pt to let her know we could do her surgery on 02/04/2019. Pt stated her family and kids wanted her to wait until after the holiday's to have her surgery. I scheduled pt for 03/25/2019. I told her I could not give her a time and someone from the surgical center would call her a day or two piror to let her know what time to arrive.

## 2019-02-28 ENCOUNTER — Ambulatory Visit
Admission: RE | Admit: 2019-02-28 | Discharge: 2019-02-28 | Disposition: A | Discharge status: Home / Self Care | Payer: Medicare Other | Source: Ambulatory Visit | Attending: Family Medicine | Admitting: Family Medicine

## 2019-02-28 DIAGNOSIS — Z780 Asymptomatic menopausal state: Secondary | ICD-10-CM | POA: Diagnosis not present

## 2019-02-28 DIAGNOSIS — Z1231 Encounter for screening mammogram for malignant neoplasm of breast: Secondary | ICD-10-CM | POA: Diagnosis present

## 2019-03-14 ENCOUNTER — Telehealth: Payer: Self-pay | Admitting: Podiatry

## 2019-03-14 NOTE — Telephone Encounter (Signed)
I'm scheduled for surgery on 03/25/19 and someone called me the other day but I was out and did not get any of the information on what I'm supposed to do. I'm unable to register online due to my Internet being down. Please call with what I need to do.

## 2019-03-14 NOTE — Telephone Encounter (Signed)
Left voicemail for pt letting her know to call [place] that someone may have been calling her to get the information they need over the phone since her Internet is down. Told her to call me back with any other questions/concerns.

## 2019-03-21 ENCOUNTER — Telehealth: Payer: Self-pay | Admitting: Podiatry

## 2019-03-21 NOTE — Telephone Encounter (Signed)
Called pt to verify her insurance for the new year to get benefits for her surgery on Friday. Pt told me she now has [employer] as primary and Medicare A & B as secondary.

## 2019-03-21 NOTE — Telephone Encounter (Signed)
Left message to see if pt's insurance has changed and if so, to update it so I can check her benefits for her surgery this Friday with Dr. Milinda Pointer.

## 2019-03-21 NOTE — Telephone Encounter (Signed)
DOS: 03/25/2019  SURGICAL PROCEDURES: Lapidus Procedure Including Bunionectomy [id number]) and Cast Application Left.  State BCBS Supplement Policy Effective : [id number]  -  03/16/9998  Member Liability Summary       In-Network   Max Per Benefit Period Year-to-Date Remaining     CoInsurance 30%      Deductible $1500.00 $1500.00     Out-Of-Pocket $5900.00 $5886.68  AMBULATORY SURGERY      In Network Copay Coinsurance Not Applicable [id number]  per  [place]

## 2019-03-24 ENCOUNTER — Other Ambulatory Visit: Payer: Self-pay | Admitting: Podiatry

## 2019-03-24 MED ORDER — OXYCODONE-ACETAMINOPHEN 10-325 MG PO TABS: 1.0000 | ORAL_TABLET | Freq: Three times a day (TID) | ORAL | 0 refills | Status: AC | PRN

## 2019-03-24 MED ORDER — ONDANSETRON HCL 4 MG PO TABS: 4.0000 mg | ORAL_TABLET | Freq: Three times a day (TID) | ORAL | 0 refills | Status: DC | PRN

## 2019-03-24 MED ORDER — CLINDAMYCIN HCL 150 MG PO CAPS: 150.0000 mg | ORAL_CAPSULE | Freq: Three times a day (TID) | ORAL | 0 refills | Status: DC

## 2019-03-25 ENCOUNTER — Encounter: Payer: Self-pay | Admitting: Podiatry

## 2019-03-25 DIAGNOSIS — M2012 Hallux valgus (acquired), left foot: Secondary | ICD-10-CM | POA: Diagnosis not present

## 2019-03-30 ENCOUNTER — Ambulatory Visit (INDEPENDENT_AMBULATORY_CARE_PROVIDER_SITE_OTHER): Payer: Medicare Other

## 2019-03-30 ENCOUNTER — Encounter: Payer: Self-pay | Admitting: Podiatry

## 2019-03-30 ENCOUNTER — Ambulatory Visit (INDEPENDENT_AMBULATORY_CARE_PROVIDER_SITE_OTHER): Payer: Medicare Other | Admitting: Podiatry

## 2019-03-30 ENCOUNTER — Other Ambulatory Visit: Payer: Self-pay

## 2019-03-30 VITALS — BP 107/65 | HR 91 | Temp 98.8°F

## 2019-03-30 DIAGNOSIS — M2012 Hallux valgus (acquired), left foot: Secondary | ICD-10-CM | POA: Diagnosis not present

## 2019-03-30 DIAGNOSIS — Z9889 Other specified postprocedural states: Secondary | ICD-10-CM

## 2019-03-30 NOTE — Progress Notes (Signed)
She presents today for her first postop visit date of surgery March 25, 2019 status post Lapidus procedure and cast.  She states that she has been walking on it some and that she got it wet a little bit but tried drying it.  She denies fever chills nausea vomiting muscle aches and pains.  Objective: Vital signs are stable she is alert and oriented x3.  Cast is intact dirty and flattened on the bottom left foot.  Cast is loose proximally and distally has good range of motion of the toes with good sensation.  Radiographs taken today demonstrate internal fixation in good position.  Consisting of a screw and a staple.  Assessment: Well-healing surgical foot.  Plan: Follow-up with her in 1 week for cast change.

## 2019-04-06 ENCOUNTER — Encounter: Payer: Self-pay | Admitting: Podiatry

## 2019-04-06 ENCOUNTER — Other Ambulatory Visit: Payer: Self-pay

## 2019-04-06 ENCOUNTER — Ambulatory Visit (INDEPENDENT_AMBULATORY_CARE_PROVIDER_SITE_OTHER): Payer: Medicare Other | Admitting: Podiatry

## 2019-04-06 DIAGNOSIS — M2012 Hallux valgus (acquired), left foot: Secondary | ICD-10-CM

## 2019-04-06 DIAGNOSIS — Z9889 Other specified postprocedural states: Secondary | ICD-10-CM

## 2019-04-06 NOTE — Progress Notes (Signed)
She presents today postop visit #2 date of surgery March 25, 2019 Lapidus procedure including bunionectomy with cast application.  Last time she was in she had been noncompliant from getting the foot wet she states that she got it wet again and got the toes really wet.  She states that she just let it dry off and that she patted dry.  She denies fever chills nausea vomiting muscle aches pains.  Goes on to say she had also been walking on it.  Objective: Vital signs are stable alert oriented x3.  Cast is intact once removed demonstrates a wet bandage to the toes with an area of maceration to the distal portion of the incision left.  She has good range of motion of the toes.  Proximal incision is intact just about 1 cm of the distalmost aspect of the incision it appears to be macerated.  Assessment: Well-healing surgical foot though she is noncompliant getting the foot wet.  Plan: Redressed the foot today dressed a compressive dressing with antibiotic ointment to the wound.  She will watch for signs and symptoms of infection.  Fever chills nausea vomiting muscle aches and pains.  Also went ahead and recasted the foot leg today leaving that area open for visualization.

## 2019-04-20 ENCOUNTER — Other Ambulatory Visit: Payer: Self-pay

## 2019-04-20 ENCOUNTER — Encounter: Payer: Self-pay | Admitting: Podiatry

## 2019-04-20 ENCOUNTER — Ambulatory Visit (INDEPENDENT_AMBULATORY_CARE_PROVIDER_SITE_OTHER): Payer: Medicare Other | Admitting: Podiatry

## 2019-04-20 ENCOUNTER — Ambulatory Visit (INDEPENDENT_AMBULATORY_CARE_PROVIDER_SITE_OTHER): Payer: Medicare Other

## 2019-04-20 DIAGNOSIS — M2012 Hallux valgus (acquired), left foot: Secondary | ICD-10-CM

## 2019-04-20 DIAGNOSIS — Z9889 Other specified postprocedural states: Secondary | ICD-10-CM

## 2019-04-20 NOTE — Progress Notes (Signed)
She presents today for postop visit date of surgery March 25, 2019 status post Lapidus procedure with bunionectomy.  She states that she is doing really well she states the pain is decreased a lot.  Denies fever chills nausea vomiting muscle aches pains calf pain back pain chest pain shortness of breath.  Objective: Vital signs are stable alert and oriented x3.  Pulses are palpable.  Cast was removed because of her walking on it she has some superficial ulcerations dorsal distal aspect of the incision site left foot.  No drainage no serosanguineous drainage no purulence no malodor.  Radiographs demonstrate a rectus first metatarsophalangeal joint and rectus toe.  Assessment well-healing surgical foot superficial ulceration secondary to ambulation in a nonweightbearing weightbearing cast.  Plan: Put her in a cam walker today dressed a compressive dressing discussed local wound care she understands is amenable to follow-up with me in 2 weeks.

## 2019-05-04 ENCOUNTER — Ambulatory Visit (INDEPENDENT_AMBULATORY_CARE_PROVIDER_SITE_OTHER): Payer: Medicare Other

## 2019-05-04 ENCOUNTER — Other Ambulatory Visit: Payer: Self-pay

## 2019-05-04 ENCOUNTER — Encounter: Payer: Self-pay | Admitting: Podiatry

## 2019-05-04 ENCOUNTER — Ambulatory Visit (INDEPENDENT_AMBULATORY_CARE_PROVIDER_SITE_OTHER): Payer: Medicare Other | Admitting: Podiatry

## 2019-05-04 DIAGNOSIS — M2012 Hallux valgus (acquired), left foot: Secondary | ICD-10-CM | POA: Diagnosis not present

## 2019-05-04 DIAGNOSIS — Z9889 Other specified postprocedural states: Secondary | ICD-10-CM

## 2019-05-04 NOTE — Progress Notes (Signed)
She presents today date of surgery March 25, 2019 status post Lapidus procedure including bunionectomy states that she still has some pain in the area.  She would like to be out of her boot.  Objective: Vital signs are stable she is alert and oriented x3.  Pulses are palpable.  She has great range of motion of the first metatarsophalangeal joint and the toe is rectus her lesser toes are a bit laterally dislocated but should straighten up over time.  Radiographs taken today demonstrate a mildly elevated first metatarsal part of it is the way her entire foot is sitting.  I do feel that all in all she is doing very well and it appears that she is healing the osteotomy site.  Assessment: Well-healing surgical foot.  Plan: Put her in a Darco shoe for the next 2 to 3 weeks follow-up with her at that time get back into her regular shoe gear.

## 2019-05-19 ENCOUNTER — Ambulatory Visit: Payer: Medicare Other | Attending: Internal Medicine

## 2019-05-19 DIAGNOSIS — Z23 Encounter for immunization: Secondary | ICD-10-CM

## 2019-05-19 NOTE — Progress Notes (Signed)
   Covid-19 Vaccination Clinic  Name:  Katrina Simon    MRN: [id number] DOB: June 25, 1950  05/19/2019  Katrina Simon was observed post Covid-19 immunization for 15 minutes without incident. She was provided with Vaccine Information Sheet and instruction to access the V-Safe system.   Katrina Simon was instructed to call 911 with any severe reactions post vaccine: Marland Kitchen Difficulty breathing  . Swelling of face and throat  . A fast heartbeat  . A bad rash all over body  . Dizziness and weakness   Immunizations Administered    Name Date Dose VIS Date Route   Pfizer COVID-19 Vaccine 05/19/2019  9:06 AM 0.3 mL 02/25/2019 Intramuscular   Manufacturer: [place]   Lot: [id number]   [place]: [id number]

## 2019-05-30 ENCOUNTER — Ambulatory Visit (INDEPENDENT_AMBULATORY_CARE_PROVIDER_SITE_OTHER): Payer: Medicare Other

## 2019-05-30 ENCOUNTER — Other Ambulatory Visit: Payer: Self-pay

## 2019-05-30 ENCOUNTER — Encounter: Payer: Self-pay | Admitting: Podiatry

## 2019-05-30 ENCOUNTER — Ambulatory Visit (INDEPENDENT_AMBULATORY_CARE_PROVIDER_SITE_OTHER): Payer: Medicare Other | Admitting: Podiatry

## 2019-05-30 VITALS — Temp 96.4°F

## 2019-05-30 DIAGNOSIS — M2012 Hallux valgus (acquired), left foot: Secondary | ICD-10-CM

## 2019-05-30 DIAGNOSIS — Z9889 Other specified postprocedural states: Secondary | ICD-10-CM

## 2019-05-30 MED ORDER — GABAPENTIN 300 MG PO CAPS: 300.0000 mg | ORAL_CAPSULE | Freq: Every day | ORAL | 3 refills | Status: DC

## 2019-05-30 MED ORDER — METHYLPREDNISOLONE 4 MG PO TABS: ORAL_TABLET | ORAL | 0 refills | Status: DC

## 2019-05-30 NOTE — Progress Notes (Addendum)
She presents today date of surgery March 25, 2019 Lapidus procedure left foot.  States that hurts all the time he turned purple after a soak it in warm water right foot aches and throbs pretty much all the time she states that her pain levels about a 5 out of 10 for the most part.Swollen most of the time.  Objective: Vital signs are stable she is alert and oriented x3 presents in a pair of Uggs today.  Ambulating relatively normally.  Left foot is swollen and indurated from the incision distally. Appears to have some venous insuffiency.  She had great range of motion previously and now is starting to become tight on dorsiflexion.  Radiographically the toe is rectus and all of her toes are looking much better.  Assessment: Decrease in range of motion after Lapidus procedure secondary to scar tissue and swelling. Venous insufficency.  Plan: This point I will send her for physical therapy start on a Medrol Dosepak to see if we can get this inflammation down.  And from her restless leg something, started on 300 mg of gabapentin. Requesting compression devices for the edema.

## 2019-05-31 ENCOUNTER — Telehealth: Payer: Self-pay | Admitting: *Deleted

## 2019-05-31 NOTE — Telephone Encounter (Signed)
Pt called states she would be able to get a free foot massage if Dr. Marta Antu.

## 2019-06-08 ENCOUNTER — Telehealth: Payer: Self-pay | Admitting: *Deleted

## 2019-06-08 NOTE — Telephone Encounter (Signed)
[employer] sent a certificate of Medical Necessity for a pneumatic compression device.  Dr. Milinda Pointer completed the form.  He stated the patient has chronic venous insufficiency.  The request form was faxed back to [facility].

## 2019-06-09 ENCOUNTER — Ambulatory Visit: Payer: Medicare Other | Attending: Internal Medicine

## 2019-06-09 DIAGNOSIS — Z23 Encounter for immunization: Secondary | ICD-10-CM

## 2019-06-21 ENCOUNTER — Ambulatory Visit (INDEPENDENT_AMBULATORY_CARE_PROVIDER_SITE_OTHER): Payer: Medicare Other

## 2019-06-21 ENCOUNTER — Other Ambulatory Visit: Payer: Self-pay

## 2019-06-21 ENCOUNTER — Ambulatory Visit (INDEPENDENT_AMBULATORY_CARE_PROVIDER_SITE_OTHER): Payer: Medicare Other | Admitting: Podiatry

## 2019-06-21 DIAGNOSIS — M2012 Hallux valgus (acquired), left foot: Secondary | ICD-10-CM

## 2019-06-21 DIAGNOSIS — Z9889 Other specified postprocedural states: Secondary | ICD-10-CM

## 2019-06-21 DIAGNOSIS — M778 Other enthesopathies, not elsewhere classified: Secondary | ICD-10-CM | POA: Diagnosis not present

## 2019-06-22 ENCOUNTER — Encounter: Payer: Self-pay | Admitting: Podiatry

## 2019-06-22 NOTE — Progress Notes (Signed)
Subjective:  Patient ID: Katrina Simon, female    DOB: 09-05-1950,  MRN: [id number]  Chief Complaint  Patient presents with  . Routine Post Op    pt is here for a f/u on a routine post op of the left foot, pt is still in pain since the last time she was here, pt also states that the swelling has gone down, but is looking to get something to help with the throbbing.    69 y.o. female presents with the above complaint.  Patient presents with a follow-up from a Lapidus procedure left foot that was done in January 8 by Dr. Milinda Pointer.  Patient states that there is still pain associated with it.  The pain appears to be following the course of the incision site.  There is mild reactive redness following the course of the incision site.  Patient has not been taking gabapentin.  There is throbbing pain which is 8 out of 10 associated with on the pain scale.  There is pain on the dorsal midfoot that seems to be getting worse.  She denies any other acute treatment options.  She has knots tried anything else.   Review of Systems: Negative except as noted in the HPI. Denies N/V/F/Ch.  Past Medical History:  Diagnosis Date  . Achalasia   . Allergy   . Arthritis   . Depression   . Emphysema of lung ([place])   . Fibromyalgia   . GERD (gastroesophageal reflux disease)   . Herpes genitalis   . History of chicken pox   . History of kidney stones     Current Outpatient Medications:  .  albuterol (PROVENTIL HFA;VENTOLIN HFA) 108 (90 Base) MCG/ACT inhaler, Inhale into the lungs every 6 (six) hours as needed for wheezing or shortness of breath., Disp: , Rfl:  .  Budesonide-Formoterol Fumarate (SYMBICORT IN), Inhale into the lungs., Disp: , Rfl:  .  clindamycin (CLEOCIN) 150 MG capsule, Take 1 capsule (150 mg total) by mouth 3 (three) times daily., Disp: 30 capsule, Rfl: 0 .  dexmethylphenidate (FOCALIN) 10 MG tablet, Take 40 mg by mouth as needed. Reported on 10/04/2015, Disp: , Rfl:  .  FLUoxetine (PROZAC) 40 MG  capsule, , Disp: , Rfl:  .  fluticasone (FLONASE) 50 MCG/ACT nasal spray, Place 1 spray into both nostrils daily., Disp: , Rfl:  .  gabapentin (NEURONTIN) 300 MG capsule, Take 1 capsule (300 mg total) by mouth at bedtime., Disp: 30 capsule, Rfl: 3 .  methylPREDNISolone (MEDROL) 4 MG tablet, Take as directed on package, Disp: 21 tablet, Rfl: 0 .  MIRALAX 17 GM/SCOOP powder, TAKE AS DIRECTED FOR COLONIC PREP., Disp: , Rfl:  .  OLANZapine (ZYPREXA) 5 MG tablet, , Disp: , Rfl:  .  ondansetron (ZOFRAN) 4 MG tablet, Take 1 tablet (4 mg total) by mouth every 8 (eight) hours as needed., Disp: 20 tablet, Rfl: 0 .  oxybutynin (DITROPAN-XL) 10 MG 24 hr tablet, Take 10 mg by mouth daily., Disp: , Rfl:  .  oxybutynin (DITROPAN-XL) 5 MG 24 hr tablet, TAKE 1 TABLET (5 MG TOTAL) BY MOUTH ONCE DAILY FOR 99 DAYS PATIENT NEEDS TO SCHEDULE OFFICE VISIT., Disp: , Rfl:  .  oxyCODONE (OXYCONTIN) 10 mg 12 hr tablet, Limit 1-2 tabs by mouth every 12 hours if tolerated, Disp: 90 tablet, Rfl: 0 .  oxyCODONE (ROXICODONE) 5 MG immediate release tablet, Limit 3-4 tabs by mouth per day for breakthrough pain while taking OxyContin if tolerated, Disp: 100 tablet, Rfl: 0 .  XTAMPZA ER 9 MG C12A, Take 1 capsule by mouth 2 (two) times daily., Disp: , Rfl:  .  zolpidem (AMBIEN) 5 MG tablet, Take 5 mg by mouth at bedtime. , Disp: , Rfl:  .  pantoprazole (PROTONIX) 40 MG tablet, Take by mouth., Disp: , Rfl:   Current Facility-Administered Medications:  .  bupivacaine (PF) (MARCAINE) 0.25 % injection 30 mL, 30 mL, Other, Once, Mohammed Kindle, MD .  bupivacaine (PF) (MARCAINE) 0.25 % injection 30 mL, 30 mL, Other, Once, Mohammed Kindle, MD .  bupivacaine (PF) (MARCAINE) 0.25 % injection 30 mL, 30 mL, Other, Once, Mohammed Kindle, MD .  fentaNYL (SUBLIMAZE) injection 100 mcg, 100 mcg, Intravenous, Once, Mohammed Kindle, MD .  fentaNYL (SUBLIMAZE) injection 100 mcg, 100 mcg, Intravenous, Once, Mohammed Kindle, MD .  lactated ringers infusion  1,000 mL, 1,000 mL, Intravenous, Continuous, Mohammed Kindle, MD .  lactated ringers infusion 1,000 mL, 1,000 mL, Intravenous, Continuous, Mohammed Kindle, MD .  lactated ringers infusion 1,000 mL, 1,000 mL, Intravenous, Continuous, Mohammed Kindle, MD, Last Rate: 125 mL/hr at 09/24/15 0826, 1,000 mL at 09/24/15 0826 .  lactated ringers infusion 1,000 mL, 1,000 mL, Intravenous, Continuous, Mohammed Kindle, MD .  Levofloxacin (LEVAQUIN) IVPB 250 mg, 250 mg, Intravenous, Once, Mohammed Kindle, MD .  lidocaine (PF) (XYLOCAINE) 1 % injection 10 mL, 10 mL, Subcutaneous, Once, Mohammed Kindle, MD .  lidocaine (PF) (XYLOCAINE) 1 % injection 10 mL, 10 mL, Subcutaneous, Once, Mohammed Kindle, MD .  midazolam (VERSED) 5 MG/5ML injection 5 mg, 5 mg, Intravenous, Once, Mohammed Kindle, MD .  midazolam (VERSED) 5 MG/5ML injection 5 mg, 5 mg, Intravenous, Once, Mohammed Kindle, MD .  midazolam (VERSED) 5 MG/5ML injection 5 mg, 5 mg, Intravenous, Once, Mohammed Kindle, MD .  orphenadrine (NORFLEX) injection 60 mg, 60 mg, Intramuscular, Once, Mohammed Kindle, MD .  sodium chloride 0.9 % injection 20 mL, 20 mL, Other, Once, Mohammed Kindle, MD .  sodium chloride flush (NS) 0.9 % injection 20 mL, 20 mL, Other, Once, Mohammed Kindle, MD .  triamcinolone acetonide (KENALOG-40) injection 40 mg, 40 mg, Other, Once, Mohammed Kindle, MD .  triamcinolone acetonide (KENALOG-40) injection 40 mg, 40 mg, Other, Once, Mohammed Kindle, MD .  triamcinolone acetonide (KENALOG-40) injection 40 mg, 40 mg, Other, Once, Mohammed Kindle, MD .  triamcinolone acetonide (KENALOG-40) injection 40 mg, 40 mg, Other, Once, Mohammed Kindle, MD  Social History   Tobacco Use  Smoking Status Former Smoker  . Packs/day: 0.50  . Years: 45.00  . Pack years: 22.50  . Types: Cigarettes  . Quit date: 09/16/2012  . Years since quitting: 6.7  Smokeless Tobacco Never Used    Allergies  Allergen Reactions  . Penicillins Hives  . Aspirin Nausea And Vomiting    . Statins Other (See Comments)    Myalgia   Objective:  There were no vitals filed for this visit. There is no height or weight on file to calculate BMI. Constitutional Well developed. Well nourished.  Vascular Dorsalis pedis pulses palpable bilaterally. Posterior tibial pulses palpable bilaterally. Capillary refill normal to all digits.  No cyanosis or clubbing noted. Pedal hair growth normal.  Neurologic Normal speech. Oriented to person, place, and time. Epicritic sensation to light touch grossly present bilaterally.  Dermatologic Nails well groomed and normal in appearance. No open wounds. No skin lesions.  Orthopedic:  Pain on palpation to the left dorsal midfoot at the site of the incision site.  No pain at the location of the hardware.  Incision  site is completely reepithelialized.  Mild redness noted coursing along the incision site.   Radiographs: 3 views of skeletally mature adult left foot: Hardware is intact without any signs of loosening.  Good consolidation noted across the Lapidus fusion site.  No other bony abnormalities identified.  Good correction and alignment noted. Assessment:   1. Hallux valgus of left foot   2. Status post left foot surgery   3. Capsulitis of right foot    Plan:  Patient was evaluated and treated and all questions answered.  Left dorsal midfoot capsulitis -Explained to the patient the etiology of capsulitis and various treatment options were discussed.  I believe patient will benefit from a steroid injection to help decrease inflammatory component and the pain associated with it.  Patient agrees with the plan would like to proceed with a steroid injection. -A steroid injection was performed at right dorsal midfoot using 1% plain Lidocaine and 10 mg of Kenalog. This was well tolerated.    No follow-ups on file.

## 2019-07-06 ENCOUNTER — Ambulatory Visit (INDEPENDENT_AMBULATORY_CARE_PROVIDER_SITE_OTHER): Payer: Medicare Other

## 2019-07-06 ENCOUNTER — Ambulatory Visit (INDEPENDENT_AMBULATORY_CARE_PROVIDER_SITE_OTHER): Payer: Medicare Other | Admitting: Podiatry

## 2019-07-06 ENCOUNTER — Encounter: Payer: Medicare Other | Admitting: Podiatry

## 2019-07-06 ENCOUNTER — Other Ambulatory Visit: Payer: Self-pay

## 2019-07-06 ENCOUNTER — Encounter: Payer: Self-pay | Admitting: Podiatry

## 2019-07-06 VITALS — Temp 97.1°F

## 2019-07-06 DIAGNOSIS — M2012 Hallux valgus (acquired), left foot: Secondary | ICD-10-CM | POA: Diagnosis not present

## 2019-07-06 DIAGNOSIS — Z9889 Other specified postprocedural states: Secondary | ICD-10-CM | POA: Diagnosis not present

## 2019-07-06 NOTE — Progress Notes (Signed)
She presents today date of surgery January 2021 Lapidus procedure including bunionectomy she was doing very well until the last couple of weeks where she started to have a lot of swelling in her midfoot.  Radiographically she said had some widening between the first and second toes and then is really started to hurt after Dr. Posey Pronto placed an injection last week.  Objective: Vital signs stable alert oriented x3.  Pulses are palpable.  The arthrodesis site in the posterior aspect of the midfoot appears to be healing very nicely however once you evaluate the first metatarsophalangeal joint is easy to see that she has a varus deformity with an obvious tear of the lateral capsule.  The toe is in a varus position and there is considerable amount of edema no erythema cellulitis drainage or odor.  No open lesions or wounds.  Assessment: Hallux varus left foot.  Plan: Discussed etiology pathology conservative versus surgical therapies I expressed to her that one of the 2 procedures will be performed either Keller implant with a single stem to help hold it in position or a fusion which will keep it in position forever.  She understands this and is amenable to it.  We did discuss possible postop complications may include but not limited to delayed union postop pain bleeding swelling infection recurrence need for further surgery overcorrection under correction loss of digit loss of limb loss of life.

## 2019-07-06 NOTE — Patient Instructions (Signed)
Pre-Operative Instructions  Congratulations, you have decided to take an important step towards improving your quality of life.  You can be assured that the doctors and staff at [facility] will be with you every step of the way.  Here are some important things you should know:  1. Plan to be at the surgery center/hospital at least 1 (one) hour prior to your scheduled time, unless otherwise directed by the surgical center/hospital staff.  You must have a responsible adult accompany you, remain during the surgery and drive you home.  Make sure you have directions to the surgical center/hospital to ensure you arrive on time. 2. If you are having surgery at [facility] or [facility] hospitals, you will need a copy of your medical history and physical form from your family physician within one month prior to the date of surgery. We will give you a form for your primary physician to complete.  3. We make every effort to accommodate the date you request for surgery.  However, there are times where surgery dates or times have to be moved.  We will contact you as soon as possible if a change in schedule is required.   4. No aspirin/ibuprofen for one week before surgery.  If you are on aspirin, any non-steroidal anti-inflammatory medications (Mobic, Aleve, Ibuprofen) should not be taken seven (7) days prior to your surgery.  You make take Tylenol for pain prior to surgery.  5. Medications - If you are taking daily heart and blood pressure medications, seizure, reflux, allergy, asthma, anxiety, pain or diabetes medications, make sure you notify the surgery center/hospital before the day of surgery so they can tell you which medications you should take or avoid the day of surgery. 6. No food or drink after midnight the night before surgery unless directed otherwise by surgical center/hospital staff. 7. No alcoholic beverages 24-hours prior to surgery.  No smoking 24-hours prior or 24-hours after  surgery. 8. Wear loose pants or shorts. They should be loose enough to fit over bandages, boots, and casts. 9. Don't wear slip-on shoes. Sneakers are preferred. 10. Bring your boot with you to the surgery center/hospital.  Also bring crutches or a walker if your physician has prescribed it for you.  If you do not have this equipment, it will be provided for you after surgery. 11. If you have not been contacted by the surgery center/hospital by the day before your surgery, call to confirm the date and time of your surgery. 12. Leave-time from work may vary depending on the type of surgery you have.  Appropriate arrangements should be made prior to surgery with your employer. 13. Prescriptions will be provided immediately following surgery by your doctor.  Fill these as soon as possible after surgery and take the medication as directed. Pain medications will not be refilled on weekends and must be approved by the doctor. 14. Remove nail polish on the operative foot and avoid getting pedicures prior to surgery. 15. Wash the night before surgery.  The night before surgery wash the foot and leg well with water and the antibacterial soap provided. Be sure to pay special attention to beneath the toenails and in between the toes.  Wash for at least three (3) minutes. Rinse thoroughly with water and dry well with a towel.  Perform this wash unless told not to do so by your physician.  Enclosed: 1 Ice pack (please put in freezer the night before surgery)   1 Hibiclens skin cleaner     Pre-op instructions  If you have any questions regarding the instructions, please do not hesitate to call our office.  [place]: [address], [place], [place] [address] -- [phone]  [place]: [address] [place], [place] [address] -- [phone]  [place]: [address], [place], [place] [address] -- [phone]   Website: [email or web address] 

## 2019-07-20 ENCOUNTER — Other Ambulatory Visit: Payer: Self-pay | Admitting: Podiatry

## 2019-07-20 MED ORDER — CLINDAMYCIN HCL 150 MG PO CAPS: 150.0000 mg | ORAL_CAPSULE | Freq: Three times a day (TID) | ORAL | 0 refills | Status: DC

## 2019-07-20 MED ORDER — OXYCODONE-ACETAMINOPHEN 10-325 MG PO TABS: 1.0000 | ORAL_TABLET | Freq: Three times a day (TID) | ORAL | 0 refills | Status: AC | PRN

## 2019-07-20 MED ORDER — ONDANSETRON HCL 4 MG PO TABS: 4.0000 mg | ORAL_TABLET | Freq: Three times a day (TID) | ORAL | 0 refills | Status: DC | PRN

## 2019-07-22 ENCOUNTER — Encounter: Payer: Self-pay | Admitting: Podiatry

## 2019-07-22 DIAGNOSIS — M2012 Hallux valgus (acquired), left foot: Secondary | ICD-10-CM | POA: Diagnosis not present

## 2019-07-27 ENCOUNTER — Encounter: Payer: Self-pay | Admitting: Podiatry

## 2019-07-27 ENCOUNTER — Ambulatory Visit (INDEPENDENT_AMBULATORY_CARE_PROVIDER_SITE_OTHER): Payer: Medicare Other

## 2019-07-27 ENCOUNTER — Other Ambulatory Visit: Payer: Self-pay

## 2019-07-27 ENCOUNTER — Ambulatory Visit (INDEPENDENT_AMBULATORY_CARE_PROVIDER_SITE_OTHER): Payer: Medicare Other | Admitting: Podiatry

## 2019-07-27 DIAGNOSIS — M2012 Hallux valgus (acquired), left foot: Secondary | ICD-10-CM | POA: Diagnosis not present

## 2019-07-27 DIAGNOSIS — Z9889 Other specified postprocedural states: Secondary | ICD-10-CM

## 2019-07-27 NOTE — Progress Notes (Signed)
She presents today for her first postop visit she is status post first metatarsophalangeal joint fusion with Integra system.  She states that she is having a lot of throbbing with the Percocet is not taking care of.  She denies fever chills nausea vomiting muscle aches pains calf pain back pain chest pain shortness of breath.  Objective: Vital signs are stable she is alert oriented x3.  Dressed no dressing intact was removed demonstrates moderate edema overlying the first metatarsophalangeal joint minimal if any erythema no cellulitis drainage or odor.  Incision site appears to be healing very nicely there is no dehiscence no separation.  Radiographs taken today demonstrate plates and screws and staple intact first metatarsophalangeal joint but no gapping visible.  Assessment: Well-healing surgical toe.  Plan: Redressed today dressed a compressive dressing like to follow-up with her in 1 week to reevaluate.  She will continue partial weightbearing to the heel with crutches.

## 2019-08-08 ENCOUNTER — Ambulatory Visit (INDEPENDENT_AMBULATORY_CARE_PROVIDER_SITE_OTHER): Payer: Medicare Other | Admitting: Podiatry

## 2019-08-08 ENCOUNTER — Encounter: Payer: Self-pay | Admitting: Podiatry

## 2019-08-08 ENCOUNTER — Other Ambulatory Visit: Payer: Self-pay

## 2019-08-08 DIAGNOSIS — Z9889 Other specified postprocedural states: Secondary | ICD-10-CM

## 2019-08-08 NOTE — Progress Notes (Signed)
She presents today 07/22/2019 status post first metatarsophalangeal joint fusion she states that is doing better she presents today walking with 1 crutch instead of being completely nonweightbearing.  Objective: Vital signs are stable she is alert oriented x3.  Pulses are palpable.  Toe is rectus in good position.  Sutures are intact margins are well coapted.  Remove the sutures today margins remain well coapted.  Assessment: Well-healing surgical foot left.  Plan: I will follow-up with her in 2 weeks continue nonweightbearing status crutches and Cam walker.

## 2019-08-09 ENCOUNTER — Encounter: Payer: Self-pay | Admitting: Podiatry

## 2019-08-22 ENCOUNTER — Encounter: Payer: Medicare Other | Admitting: Podiatry

## 2019-08-24 ENCOUNTER — Other Ambulatory Visit: Payer: Self-pay

## 2019-08-24 ENCOUNTER — Ambulatory Visit (INDEPENDENT_AMBULATORY_CARE_PROVIDER_SITE_OTHER): Payer: Medicare Other | Admitting: Podiatry

## 2019-08-24 ENCOUNTER — Encounter: Payer: Self-pay | Admitting: Podiatry

## 2019-08-24 ENCOUNTER — Ambulatory Visit (INDEPENDENT_AMBULATORY_CARE_PROVIDER_SITE_OTHER): Payer: Medicare Other

## 2019-08-24 DIAGNOSIS — Z9889 Other specified postprocedural states: Secondary | ICD-10-CM

## 2019-08-24 DIAGNOSIS — M2012 Hallux valgus (acquired), left foot: Secondary | ICD-10-CM

## 2019-08-24 NOTE — Progress Notes (Signed)
She presents today for postop visit she is 1 month out now date of surgery 07/22/2019 first metatarsophalangeal joint fusion left.  States that she has been doing pretty well with that other than when she went to see Dr. Primus Bravo on Monday she felt like she was on it too much and it was kind of sore that day.  She continues to walk with 1 crutch to the right side.  She denies fever chills nausea vomiting muscle aches pains calf pain back pain chest pain shortness of breath.  Objective: Vital signs are stable alert and oriented x3 pulses are palpable.  Minimal edema to the surgical site, which is gone on to heal uneventfully.  She has no range of motion at the first metatarsophalangeal joint there is no pain on palpation.  Radiographs taken today of the left foot demonstrates good compression across the first metatarsophalangeal joint internal fixation is intact and does not appear to be loosening.  Assessment: Well-healing surgical toe.  Plan: Put her back in her compression anklet cam walker for another 3 weeks we will follow-up with her at that time for another x-ray hopefully she will have her on complete incorporation and fusion which we can then put her in a Darco shoe or letter back into her tennis shoe.  Until then she is to continue the use of the cam walker.

## 2019-09-05 ENCOUNTER — Encounter: Payer: Medicare Other | Admitting: Podiatry

## 2019-09-14 ENCOUNTER — Ambulatory Visit: Payer: Medicare Other

## 2019-09-14 ENCOUNTER — Encounter: Payer: Self-pay | Admitting: Podiatry

## 2019-09-14 ENCOUNTER — Ambulatory Visit (INDEPENDENT_AMBULATORY_CARE_PROVIDER_SITE_OTHER): Payer: Medicare Other | Admitting: Podiatry

## 2019-09-14 ENCOUNTER — Other Ambulatory Visit: Payer: Self-pay

## 2019-09-14 DIAGNOSIS — M2012 Hallux valgus (acquired), left foot: Secondary | ICD-10-CM

## 2019-09-14 DIAGNOSIS — Z9889 Other specified postprocedural states: Secondary | ICD-10-CM

## 2019-09-14 MED ORDER — GABAPENTIN 300 MG PO CAPS: 600.0000 mg | ORAL_CAPSULE | Freq: Every day | ORAL | 3 refills | Status: DC

## 2019-09-14 NOTE — Progress Notes (Signed)
She presents today status post IPJ fusion hallux left.  States that it still hurts a lot but all in all it seems to be doing okay.  Objective: Vital signs are stable she is alert and oriented x3.  There is no erythema edema cellulitis drainage or odor.  Radiographs demonstrate a well-healing first metatarsophalangeal joint.  Much decrease in edema.  Assessment well-healing fusion site first metatarsophalangeal joint left.  Plan: I will try to let her get back into her regular tennis shoe number follow-up with her in 1 month.

## 2019-10-17 ENCOUNTER — Encounter: Payer: Medicare Other | Admitting: Podiatry

## 2019-10-19 ENCOUNTER — Ambulatory Visit (INDEPENDENT_AMBULATORY_CARE_PROVIDER_SITE_OTHER): Payer: Medicare Other | Admitting: Podiatry

## 2019-10-19 ENCOUNTER — Encounter: Payer: Self-pay | Admitting: Podiatry

## 2019-10-19 ENCOUNTER — Ambulatory Visit (INDEPENDENT_AMBULATORY_CARE_PROVIDER_SITE_OTHER): Payer: Medicare Other

## 2019-10-19 ENCOUNTER — Other Ambulatory Visit: Payer: Self-pay

## 2019-10-19 DIAGNOSIS — M778 Other enthesopathies, not elsewhere classified: Secondary | ICD-10-CM | POA: Diagnosis not present

## 2019-10-19 DIAGNOSIS — M2012 Hallux valgus (acquired), left foot: Secondary | ICD-10-CM

## 2019-10-19 DIAGNOSIS — M7752 Other enthesopathy of left foot: Secondary | ICD-10-CM

## 2019-10-19 DIAGNOSIS — Z9889 Other specified postprocedural states: Secondary | ICD-10-CM

## 2019-10-19 NOTE — Progress Notes (Signed)
She presents today for follow-up of her first metatarsophalangeal joint fusion back in May states that fusion is doing pretty well but the second toe is starting to turn outward.  She does have a history of rheumatoid arthritis according to her.  Objective: Vital signs are stable she is alert oriented x3 there is no erythema edema cellulitis drainage odor is good rigid fusion at the first metatarsophalangeal joint.  She also has some tenderness palpation to the second interdigital space against the lateral aspect of the second metatarsal phalangeal joint.  Assessment: Well-healing surgical foot with capsulitis of the second metatarsophalangeal.  Plan: I injected this area today with 20 mg Kenalog 5 mg Marcaine point maximal tenderness.  Tolerated seizure well without complications follow-up with me in a few weeks.

## 2019-10-22 DIAGNOSIS — N183 Chronic kidney disease, stage 3 unspecified: Secondary | ICD-10-CM | POA: Insufficient documentation

## 2019-11-30 ENCOUNTER — Ambulatory Visit: Payer: Medicare Other | Admitting: Podiatry

## 2019-12-14 ENCOUNTER — Other Ambulatory Visit: Payer: Self-pay

## 2019-12-14 ENCOUNTER — Ambulatory Visit (INDEPENDENT_AMBULATORY_CARE_PROVIDER_SITE_OTHER): Payer: Medicare Other | Admitting: Podiatry

## 2019-12-14 DIAGNOSIS — G5792 Unspecified mononeuropathy of left lower limb: Secondary | ICD-10-CM | POA: Diagnosis not present

## 2019-12-14 MED ORDER — DICLOFENAC SODIUM 1 % EX GEL: 4.0000 g | Freq: Four times a day (QID) | CUTANEOUS | 3 refills | Status: AC

## 2019-12-14 NOTE — Progress Notes (Signed)
She presents today states that she has a tight compression kind of feeling across her foot.  She states that seems to be improving she does not have any pain in the foot anymore.  Objective: Vital signs are stable she is alert oriented x3.  Pulses are palpable.  There is no erythema edema cellulitis drainage or odor sensation is intact per Semmes Weinstein monofilament.  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  Plan: I recommended Voltaren gel to be used on the foot.  I will follow-up with her if this is not improved by January which will be a year from surgery.  I did discuss possible nerve blocks.

## 2020-01-14 ENCOUNTER — Other Ambulatory Visit: Payer: Self-pay | Admitting: Podiatry

## 2020-01-15 NOTE — Telephone Encounter (Signed)
Please advise 

## 2020-01-26 DIAGNOSIS — K2100 Gastro-esophageal reflux disease with esophagitis, without bleeding: Secondary | ICD-10-CM

## 2020-01-26 HISTORY — DX: Gastro-esophageal reflux disease with esophagitis, without bleeding: K21.00

## 2020-02-24 ENCOUNTER — Other Ambulatory Visit: Payer: Self-pay | Admitting: Neurology

## 2020-02-24 ENCOUNTER — Other Ambulatory Visit (HOSPITAL_COMMUNITY): Payer: Self-pay | Admitting: Neurology

## 2020-02-24 DIAGNOSIS — Z818 Family history of other mental and behavioral disorders: Secondary | ICD-10-CM

## 2020-02-24 DIAGNOSIS — G3184 Mild cognitive impairment, so stated: Secondary | ICD-10-CM

## 2020-03-07 ENCOUNTER — Ambulatory Visit (HOSPITAL_COMMUNITY)
Admission: RE | Admit: 2020-03-07 | Discharge: 2020-03-07 | Disposition: A | Discharge status: Home / Self Care | Payer: Medicare Other | Source: Ambulatory Visit | Attending: Neurology | Admitting: Neurology

## 2020-03-07 ENCOUNTER — Other Ambulatory Visit: Payer: Self-pay

## 2020-03-07 DIAGNOSIS — G3184 Mild cognitive impairment, so stated: Secondary | ICD-10-CM | POA: Diagnosis present

## 2020-03-07 DIAGNOSIS — Z818 Family history of other mental and behavioral disorders: Secondary | ICD-10-CM | POA: Diagnosis present

## 2020-03-07 MED ORDER — GADOBUTROL 1 MMOL/ML IV SOLN
7.0000 mL | Freq: Once | INTRAVENOUS | Status: AC | PRN
  Administered 2020-03-07: 7 mL via INTRAVENOUS

## 2020-05-18 ENCOUNTER — Other Ambulatory Visit: Payer: Self-pay | Admitting: Podiatry

## 2020-05-20 NOTE — Telephone Encounter (Signed)
Please advise 

## 2020-05-22 ENCOUNTER — Encounter: Payer: Self-pay | Admitting: *Deleted

## 2020-07-10 ENCOUNTER — Other Ambulatory Visit: Payer: Self-pay

## 2020-07-11 ENCOUNTER — Ambulatory Visit: Payer: Medicare Other | Admitting: Gastroenterology

## 2020-07-25 ENCOUNTER — Other Ambulatory Visit: Payer: Self-pay

## 2020-07-25 ENCOUNTER — Inpatient Hospital Stay
Admission: EM | Admit: 2020-07-25 | Discharge: 2020-07-28 | DRG: 070 | Disposition: A | Discharge status: Home Health | Payer: Medicare Other | Attending: Internal Medicine | Admitting: Internal Medicine

## 2020-07-25 ENCOUNTER — Emergency Department: Payer: Medicare Other

## 2020-07-25 ENCOUNTER — Encounter: Payer: Self-pay | Admitting: Emergency Medicine

## 2020-07-25 DIAGNOSIS — W19XXXA Unspecified fall, initial encounter: Secondary | ICD-10-CM

## 2020-07-25 DIAGNOSIS — M5134 Other intervertebral disc degeneration, thoracic region: Secondary | ICD-10-CM | POA: Diagnosis present

## 2020-07-25 DIAGNOSIS — K219 Gastro-esophageal reflux disease without esophagitis: Secondary | ICD-10-CM | POA: Diagnosis present

## 2020-07-25 DIAGNOSIS — Z880 Allergy status to penicillin: Secondary | ICD-10-CM

## 2020-07-25 DIAGNOSIS — F0390 Unspecified dementia without behavioral disturbance: Secondary | ICD-10-CM | POA: Diagnosis present

## 2020-07-25 DIAGNOSIS — Z20822 Contact with and (suspected) exposure to covid-19: Secondary | ICD-10-CM | POA: Diagnosis present

## 2020-07-25 DIAGNOSIS — Z79899 Other long term (current) drug therapy: Secondary | ICD-10-CM | POA: Diagnosis not present

## 2020-07-25 DIAGNOSIS — G2581 Restless legs syndrome: Secondary | ICD-10-CM | POA: Diagnosis present

## 2020-07-25 DIAGNOSIS — F32A Depression, unspecified: Secondary | ICD-10-CM | POA: Diagnosis present

## 2020-07-25 DIAGNOSIS — Z888 Allergy status to other drugs, medicaments and biological substances status: Secondary | ICD-10-CM | POA: Diagnosis not present

## 2020-07-25 DIAGNOSIS — E43 Unspecified severe protein-calorie malnutrition: Secondary | ICD-10-CM | POA: Insufficient documentation

## 2020-07-25 DIAGNOSIS — M797 Fibromyalgia: Secondary | ICD-10-CM | POA: Diagnosis present

## 2020-07-25 DIAGNOSIS — Z79891 Long term (current) use of opiate analgesic: Secondary | ICD-10-CM

## 2020-07-25 DIAGNOSIS — Z87891 Personal history of nicotine dependence: Secondary | ICD-10-CM

## 2020-07-25 DIAGNOSIS — R296 Repeated falls: Secondary | ICD-10-CM | POA: Diagnosis present

## 2020-07-25 DIAGNOSIS — R4189 Other symptoms and signs involving cognitive functions and awareness: Secondary | ICD-10-CM | POA: Diagnosis present

## 2020-07-25 DIAGNOSIS — Z886 Allergy status to analgesic agent status: Secondary | ICD-10-CM

## 2020-07-25 DIAGNOSIS — J439 Emphysema, unspecified: Secondary | ICD-10-CM | POA: Diagnosis present

## 2020-07-25 DIAGNOSIS — D649 Anemia, unspecified: Secondary | ICD-10-CM | POA: Diagnosis present

## 2020-07-25 DIAGNOSIS — R410 Disorientation, unspecified: Secondary | ICD-10-CM | POA: Diagnosis present

## 2020-07-25 DIAGNOSIS — G9340 Encephalopathy, unspecified: Secondary | ICD-10-CM | POA: Diagnosis present

## 2020-07-25 DIAGNOSIS — G894 Chronic pain syndrome: Secondary | ICD-10-CM | POA: Diagnosis present

## 2020-07-25 LAB — CBC
HCT: 32.7 % — ABNORMAL LOW (ref 36.0–46.0)
Hemoglobin: 10.6 g/dL — ABNORMAL LOW (ref 12.0–15.0)
MCH: 28 pg (ref 26.0–34.0)
MCHC: 32.4 g/dL (ref 30.0–36.0)
MCV: 86.3 fL (ref 80.0–100.0)
Platelets: 228 10*3/uL (ref 150–400)
RBC: 3.79 MIL/uL — ABNORMAL LOW (ref 3.87–5.11)
RDW: 15.9 % — ABNORMAL HIGH (ref 11.5–15.5)
WBC: 7 10*3/uL (ref 4.0–10.5)
nRBC: 0 % (ref 0.0–0.2)

## 2020-07-25 LAB — RESP PANEL BY RT-PCR (FLU A&B, COVID) ARPGX2
Influenza A by PCR: NEGATIVE
Influenza B by PCR: NEGATIVE
SARS Coronavirus 2 by RT PCR: NEGATIVE

## 2020-07-25 LAB — COMPREHENSIVE METABOLIC PANEL
ALT: 14 U/L (ref 0–44)
AST: 21 U/L (ref 15–41)
Albumin: 3.8 g/dL (ref 3.5–5.0)
Alkaline Phosphatase: 92 U/L (ref 38–126)
Anion gap: 8 (ref 5–15)
BUN: 13 mg/dL (ref 8–23)
CO2: 31 mmol/L (ref 22–32)
Calcium: 9.5 mg/dL (ref 8.9–10.3)
Chloride: 102 mmol/L (ref 98–111)
Creatinine, Ser: 0.83 mg/dL (ref 0.44–1.00)
GFR, Estimated: >60 mL/min (ref >60)
Glucose, Bld: 95 mg/dL (ref 70–99)
Potassium: 4.1 mmol/L (ref 3.5–5.1)
Sodium: 141 mmol/L (ref 135–145)
Total Bilirubin: 0.7 mg/dL (ref 0.3–1.2)
Total Protein: 7 g/dL (ref 6.5–8.1)

## 2020-07-25 LAB — URINALYSIS, COMPLETE (UACMP) WITH MICROSCOPIC
Bacteria, UA: NONE SEEN
Bilirubin Urine: NEGATIVE
Glucose, UA: NEGATIVE mg/dL
Ketones, ur: NEGATIVE mg/dL
Leukocytes,Ua: NEGATIVE
Nitrite: NEGATIVE
Protein, ur: NEGATIVE mg/dL
Specific Gravity, Urine: 1.004 — ABNORMAL LOW (ref 1.005–1.030)
pH: 9 — ABNORMAL HIGH (ref 5.0–8.0)

## 2020-07-25 LAB — LIPASE, BLOOD: Lipase: 24 U/L (ref 11–51)

## 2020-07-25 LAB — URINE DRUG SCREEN, QUALITATIVE (ARMC ONLY)
Amphetamines, Ur Screen: NOT DETECTED
Barbiturates, Ur Screen: NOT DETECTED
Benzodiazepine, Ur Scrn: NOT DETECTED
Cannabinoid 50 Ng, Ur ~~LOC~~: NOT DETECTED
Cocaine Metabolite,Ur ~~LOC~~: NOT DETECTED
MDMA (Ecstasy)Ur Screen: NOT DETECTED
Methadone Scn, Ur: NOT DETECTED
Opiate, Ur Screen: NOT DETECTED
Phencyclidine (PCP) Ur S: NOT DETECTED
Tricyclic, Ur Screen: NOT DETECTED

## 2020-07-25 LAB — ETHANOL: Alcohol, Ethyl (B): <10 mg/dL (ref <10)

## 2020-07-25 LAB — TROPONIN I (HIGH SENSITIVITY): Troponin I (High Sensitivity): 4 ng/L (ref <18)

## 2020-07-25 MED ORDER — SODIUM CHLORIDE 0.9 % IV BOLUS
1000.0000 mL | Freq: Once | INTRAVENOUS | Status: AC
  Administered 2020-07-25: 1000 mL via INTRAVENOUS

## 2020-07-25 MED ORDER — ACETAMINOPHEN 650 MG RE SUPP: 650.0000 mg | Freq: Four times a day (QID) | RECTAL | Status: DC | PRN

## 2020-07-25 MED ORDER — ALBUTEROL SULFATE (2.5 MG/3ML) 0.083% IN NEBU: 2.5000 mg | INHALATION_SOLUTION | RESPIRATORY_TRACT | Status: DC | PRN

## 2020-07-25 MED ORDER — LACTATED RINGERS IV SOLN: INTRAVENOUS | Status: DC

## 2020-07-25 MED ORDER — NALOXONE HCL 2 MG/2ML IJ SOSY
0.4000 mg | PREFILLED_SYRINGE | INTRAMUSCULAR | Status: DC | PRN
  Filled 2020-07-25: qty 2

## 2020-07-25 MED ORDER — ACETAMINOPHEN 325 MG PO TABS: 650.0000 mg | ORAL_TABLET | Freq: Four times a day (QID) | ORAL | Status: DC | PRN

## 2020-07-25 NOTE — ED Provider Notes (Signed)
[facility] Emergency Department Provider Note  Time seen: 8:38 PM  I have reviewed the triage vital signs and the nursing notes.   HISTORY  Chief Complaint Hallucinations, Fall, and Altered Mental Status   HPI Katrina Simon is a 70 y.o. female with a past medical history of depression, fibromyalgia, gastric reflux, presents to the emergency department with her son for altered mental status and multiple falls.  According to the son since last night she has had 3 falls overnight and today.  Patient did not sleep last night was up very confused trying to clean and dust the house.  Son states today the patient is very out of it altered and confused.  Here the patient appears quite confused she attempts to answer questions although with rambling speech, unable to answer most questions.  We will follow basic commands.   Past Medical History:  Diagnosis Date  . Achalasia   . Allergy   . Arthritis   . Depression   . Emphysema of lung ([place])   . Fibromyalgia   . GERD (gastroesophageal reflux disease)   . Herpes genitalis   . History of chicken pox   . History of kidney stones     Patient Active Problem List   Diagnosis Date Noted  . Herpes genitalis 10/02/2016  . Chronic pain syndrome 12/06/2014  . Personal history of urinary calculi 12/06/2014  . Esophageal reflux 12/06/2014  . Facet syndrome, lumbar 09/21/2014  . DJD of shoulder 08/23/2014  . DDD (degenerative disc disease), lumbar 07/18/2014  . DDD (degenerative disc disease), thoracolumbar 07/18/2014  . DJD (degenerative joint disease) of knee 07/18/2014  . DJD (degenerative joint disease) shoulder, sacroiliac joint  07/18/2014  . CAFL (chronic airflow limitation) ([place]) 08/24/2013  . Clinical depression 08/24/2013  . Fibromyalgia 08/24/2013  . Arthritis, degenerative 08/18/2013  . Achalasia 06/29/2013  . Adenomatous colon polyp 05/10/2013  . Anxiety disorder 12/29/2006  . Benign paroxysmal positional  nystagmus 12/01/2005  . Hypotension 07/04/2005  . Generalized osteoarthrosis, involving multiple sites 03/17/1997    Past Surgical History:  Procedure Laterality Date  . CESAREAN SECTION    . ESOPHAGOGASTRODUODENOSCOPY (EGD) WITH PROPOFOL N/A 11/19/2016   Procedure: ESOPHAGOGASTRODUODENOSCOPY (EGD) WITH PROPOFOL;  Surgeon: Manya Silvas, MD;  Location: [facility] ENDOSCOPY;  Service: Endoscopy;  Laterality: N/A;  . LAPAROSCOPY N/A    done at [facility] to release esophageal muscles    Prior to Admission medications   Medication Sig Start Date End Date Taking? Authorizing Provider  albuterol (PROVENTIL HFA;VENTOLIN HFA) 108 (90 Base) MCG/ACT inhaler Inhale into the lungs every 6 (six) hours as needed for wheezing or shortness of breath.    [provider]  Budesonide-Formoterol Fumarate (SYMBICORT IN) Inhale into the lungs.    [provider]  clindamycin (CLEOCIN) 150 MG capsule Take 1 capsule (150 mg total) by mouth 3 (three) times daily. 03/24/19   Hyatt, Max T, DPM  clindamycin (CLEOCIN) 150 MG capsule Take 1 capsule (150 mg total) by mouth 3 (three) times daily. 07/20/19   Hyatt, Max T, DPM  dexmethylphenidate (FOCALIN) 10 MG tablet Take 40 mg by mouth as needed. Reported on 10/04/2015 02/17/14   [provider]  diclofenac Sodium (VOLTAREN) 1 % GEL Apply 4 g topically 4 (four) times daily. 12/14/19   Hyatt, Max T, DPM  FLUoxetine (PROZAC) 40 MG capsule  12/20/18   [provider]  fluticasone (FLONASE) 50 MCG/ACT nasal spray Place 1 spray into both nostrils daily.  [provider]  gabapentin (NEURONTIN) 300 MG capsule TAKE 2 CAPSULES (600 MG TOTAL) BY MOUTH AT BEDTIME. 05/22/20   Hyatt, Max T, DPM  methylPREDNISolone (MEDROL) 4 MG tablet Take as directed on package 05/30/19   Hyatt, Max T, DPM  MIRALAX 17 GM/SCOOP powder TAKE AS DIRECTED FOR COLONIC PREP. 11/24/18   [provider]  OLANZapine (ZYPREXA) 5 MG tablet  12/24/18   [provider]  ondansetron (ZOFRAN) 4 MG tablet Take 1 tablet (4 mg total) by mouth every 8 (eight) hours as needed. 03/24/19   Hyatt, Max T, DPM  ondansetron (ZOFRAN) 4 MG tablet Take 1 tablet (4 mg total) by mouth every 8 (eight) hours as needed. 07/20/19   Hyatt, Max T, DPM  oxybutynin (DITROPAN-XL) 10 MG 24 hr tablet Take 10 mg by mouth daily. 06/20/19   [provider]  oxybutynin (DITROPAN-XL) 5 MG 24 hr tablet TAKE 1 TABLET (5 MG TOTAL) BY MOUTH ONCE DAILY FOR 99 DAYS PATIENT NEEDS TO SCHEDULE OFFICE VISIT. 10/29/18   [provider]  oxyCODONE (OXYCONTIN) 10 mg 12 hr tablet Limit 1-2 tabs by mouth every 12 hours if tolerated 11/01/15   Mohammed Kindle, MD  oxyCODONE (ROXICODONE) 5 MG immediate release tablet Limit 3-4 tabs by mouth per day for breakthrough pain while taking OxyContin if tolerated 11/01/15   Mohammed Kindle, MD  pantoprazole (PROTONIX) 40 MG tablet Take by mouth. 01/31/14 01/31/15  [provider]  QUEtiapine (SEROQUEL) 50 MG tablet Take by mouth. 02/18/20   [provider]  XTAMPZA ER 9 MG C12A Take 1 capsule by mouth 2 (two) times daily. 04/04/19   [provider]  zolpidem (AMBIEN) 5 MG tablet Take 5 mg by mouth at bedtime.     [provider]    Allergies  Allergen Reactions  . Penicillins Hives  . Aspirin Nausea And Vomiting  . Statins Other (See Comments)    Myalgia    Family History  Problem Relation Age of Onset  . Arthritis Mother   . Heart disease Father   . Stroke Father     Social History Social History   Tobacco Use  . Smoking status: Former Smoker    Packs/day: 0.50    Years: 45.00    Pack years: 22.50    Types: Cigarettes    Quit date: 09/16/2012    Years since quitting: 7.8  . Smokeless tobacco: Never Used  Vaping Use  . Vaping Use: Some days  Substance Use Topics  . Alcohol use: No    Alcohol/week: 0.0 standard drinks  . Drug use: No    Review of Systems Constitutional: Negative for  fever. Cardiovascular: Negative for chest pain. Respiratory: Negative for shortness of breath. Gastrointestinal: Negative for abdominal pain Musculoskeletal: Negative for musculoskeletal complaints Neurological: Negative for headache All other ROS negative  ____________________________________________   PHYSICAL EXAM:  VITAL SIGNS: ED Triage Vitals  Enc Vitals Group     BP 07/25/20 1920 (!) 145/72     Pulse Rate 07/25/20 1920 68     Resp 07/25/20 1920 15     Temp 07/25/20 1920 98.2 F (36.8 C)     Temp Source 07/25/20 1920 Oral     SpO2 07/25/20 1920 98 %     Weight 07/25/20 1917 103 lb (46.7 kg)     Height 07/25/20 1917 5' (1.524 m)     Head Circumference --      Peak Flow --      Pain  Score 07/25/20 1917 0     Pain Loc --      Pain Edu? --      Excl. in [place]? --    Constitutional: Alert and oriented. Well appearing and in no distress. Eyes: Normal exam ENT      Head: Normocephalic and atraumatic.      Mouth/Throat: Mucous membranes are moist. Cardiovascular: Normal rate, regular rhythm. Respiratory: Normal respiratory effort without tachypnea nor retractions. Breath sounds are clear Gastrointestinal: Soft and nontender. No distention.  Musculoskeletal: Nontender with normal range of motion in all extremities.  Neurologic:  Normal speech and language. No gross focal neurologic deficits Skin:  Skin is warm, dry and intact.  Psychiatric: Mood and affect are normal.   ____________________________________________    EKG  EKG viewed and interpreted by myself shows a normal sinus rhythm at 70 bpm with a narrow QRS, normal axis, normal intervals, no concerning ST changes.  ____________________________________________    RADIOLOGY  CT scan of the head is negative. X-ray negative.  ____________________________________________   INITIAL IMPRESSION / ASSESSMENT AND PLAN / ED COURSE  Pertinent labs & imaging results that were available during my care of the patient  were reviewed by me and considered in my medical decision making (see chart for details).   Patient presents emergency department with confusion and 3 falls over the past 24 hours.  Son states this is very atypical behavior has never seen her like this before.  On review of systems questioning patient does state dysuria as the only real finding.  Patient is confused and for the most part has rambling speech that does not answer the asked question.  We will check labs, urinalysis, COVID swab.  We will obtain CT imaging of the head as a precaution as well as a chest x-ray looking for possible infectious etiology or intracranial injury/stroke/bleed.  We will continue to closely monitor the patient in the emergency department.  I fully expect the patient will require admission to the hospital service given her current presentation.  Patient's work-up is essentially nonrevealing in the emergency department.  CT scan negative, x-rays negative.  Urinalysis and urine drug screen negative.  Patient remains confused.  We will admit to the hospital service for acute delirium.  Son agreeable to plan of care.  Katrina Simon was evaluated in Emergency Department on 07/25/2020 for the symptoms described in the history of present illness. She was evaluated in the context of the global COVID-19 pandemic, which necessitated consideration that the patient might be at risk for infection with the SARS-CoV-2 virus that causes COVID-19. Institutional protocols and algorithms that pertain to the evaluation of patients at risk for COVID-19 are in a state of rapid change based on information released by regulatory bodies including the CDC and federal and state organizations. These policies and algorithms were followed during the patient's care in the ED.  ____________________________________________   FINAL CLINICAL IMPRESSION(S) / ED DIAGNOSES  Altered mental status Delirium   Harvest Dark, MD 07/25/20 2333

## 2020-07-25 NOTE — H&P (Signed)
History and Physical    PLEASE NOTE THAT DRAGON DICTATION SOFTWARE WAS USED IN THE CONSTRUCTION OF THIS NOTE.   Katrina Simon AYT:[id number] DOB: 01/06/1951 DOA: 07/25/2020  PCP: [name], MD Patient coming from: home   I have personally briefly reviewed patient's old medical records in [place]  Chief Complaint: Altered mental status  HPI: Katrina Simon is a 70 y.o. 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   In the setting of the patient's presenting altered mental status, the following history is provided via my discussions with the patient's son, who was present at bedside, in addition to my discussions with the emergency department physician, and via chart review.  The patient's son conveys that the patient has exhibited 1 day of progressive confusion starting approximately between 1700-2000 on the evening of 07/24/2020.  Relative to her reported baseline mental status at which the patient is alert and oriented, patient has been progressively more confused and somnolent over the last day.  Over the last few years, the patient's son reports that the patient has been intermittently confused, providing the example of the patient forgetting urine which her son graduated from high school.  However, in spite of this intermittent forgetfulness with successful redirection, son conveys that the patient has never previously exhibited the degree of confusion that she has shown over the last day.   Son conveys that the patient lives at home with her husband, who is in charge of providing her with all of her outpatient medications on a daily basis in order to ensure  consistent compliance with her home medication regimen given her history of intermittent confusion.  Her son conveys his concern over the large number of medications that the patient is prescribed as an outpatient, and feels that her history of intermittent confusion is as a consequence of these home medications.  He believes that she was recently diagnosed with restless leg syndrome and started on a new medication for such, although he does not recall the specific nature of this medication.  Otherwise, he is unsure as to any recent modifications to the patient's home medication regimen, including unsure of any recent other additions, subtractions, or dose modifications.  He is not aware of any substance abuse history for the patient, and feels that she was in her normal state of health leading up to development of progressive confusion starting 1 day ago.   The patient's son also conveys that the patient has reportedly fallen 3 times over the last day, with 1 fall in the evening of 07/24/2020 as well as 2 falls earlier today.  The son is unsure as to the specific nature mechanism of these falls, as they occurred while the patient was at her home with her husband.  He is unsure if these falls were witnessed.  He does not believe that they were associate with any loss of consciousness.  The patient's daughter reportedly went to the patient's house twice earlier today to help the patient off of the floor after experiencing need to aforementioned falls.  She reportedly fell the patient conscious, but unable to extricate herself from the floor.   Son does not believe that the patient has been experiencing any recent  fever, chills, or full body rigors.  She has not been complaining of any recent shortness of breath or cough, nor any acute focal weakness, change in sensation, dysarthria, facial droop, acute change in vision, vertigo, or dysphagia.  No reported recent headache, neck stiffness, rash.  Additionally, son  does not believe that the patient has been experiencing any acute urinary symptoms, nausea/vomiting, abdominal pain, or diarrhea. no history of seizures.      ED Course:  Vital signs in the ED were notable for the following: Temperature max 98.2, heart rate 67-73; blood pressure 112/61 -145/72; respiratory rate 15-19; oxygen saturation 96 to 100% on room air.  Labs were notable for the following: CMP was notable for the following: Sodium 141, bicarbonate 31, anion gap 8, creatinine 0.83 relative to 1.0 when checked in September 2021, glucose 95, calcium 9.5, albumin 3.8, with additional liver enzymes within normal limits.  High-sensitivity troponin I x1 found to be 4.  CBC notable for white blood cell count of 7000, hemoglobin 10.6 relative to 10.7 incidental 2021, with presenting hemoglobin associated with normocytic/normochromic findings.  Urinalysis showed no white blood cells, no bacteria, nitrate negative, leukocyte Estrace negative, small hemoglobin, no RBCs.  Urinary drug screen was pan negative, and serum ethanol was found to be less than 10.  Nasopharyngeal COVID-19/influenza PCR performed in the emergency department this evening, and found to be negative.  EKG showed sinus rhythm with heart rate 70, normal intervals, and no evidence of T wave or ST changes, including no evidence of ST elevation.  Chest x-ray showed no evidence of acute cardiopulmonary process.  CT head showed chronic small vessel ischemic changes to the white matter, but no evidence of acute intracranial process, including no evidence of acute infarct or intracranial hemorrhage.  While in the ED, the following were administered: Normal saline x1 L bolus presumptively, the patient was admitted to the [place] floor for further evaluation and management of presenting acute encephalopathy.     Review of Systems: As per HPI otherwise 10 point review of systems negative.   Past Medical History:  Diagnosis Date  . Achalasia    . Allergy   . Arthritis   . Depression   . Emphysema of lung ([place])   . Fibromyalgia   . GERD (gastroesophageal reflux disease)   . Herpes genitalis   . History of chicken pox   . History of kidney stones     Past Surgical History:  Procedure Laterality Date  . CESAREAN SECTION    . ESOPHAGOGASTRODUODENOSCOPY (EGD) WITH PROPOFOL N/A 11/19/2016   Procedure: ESOPHAGOGASTRODUODENOSCOPY (EGD) WITH PROPOFOL;  Surgeon: Manya Silvas, MD;  Location: [facility] ENDOSCOPY;  Service: Endoscopy;  Laterality: N/A;  . LAPAROSCOPY N/A    done at [facility] to release esophageal muscles    Social History:  reports that she quit smoking about 7 years ago. Her smoking use included cigarettes. She has a 22.50 pack-year smoking history. She has never used smokeless tobacco. She reports that she does not drink alcohol and does not use drugs.   Allergies  Allergen Reactions  . Penicillins Hives  . Aspirin Nausea And Vomiting  . Statins Other (See Comments)    Myalgia    Family History  Problem Relation Age of Onset  . Arthritis Mother   . Heart disease Father   . Stroke Father      Prior to Admission medications   Medication Sig Start Date End Date Taking? Authorizing Provider  albuterol (PROVENTIL HFA;VENTOLIN HFA) 108 (  90 Base) MCG/ACT inhaler Inhale into the lungs every 6 (six) hours as needed for wheezing or shortness of breath.   Yes [provider]  diclofenac Sodium (VOLTAREN) 1 % GEL Apply 4 g topically 4 (four) times daily. 12/14/19  Yes Hyatt, Max T, DPM  FLUoxetine (PROZAC) 40 MG capsule Take 40 mg by mouth daily. 12/20/18  Yes [provider]  gabapentin (NEURONTIN) 300 MG capsule TAKE 2 CAPSULES (600 MG TOTAL) BY MOUTH AT BEDTIME. 05/22/20  Yes Hyatt, Max T, DPM  OLANZapine (ZYPREXA) 5 MG tablet Take 5 mg by mouth daily. 12/24/18  Yes [provider]  oxyCODONE (ROXICODONE) 15 MG immediate release tablet Take 0.5-1 tablets by mouth 5 (five) times daily  as needed. 07/20/20  Yes [provider]  QUEtiapine (SEROQUEL) 50 MG tablet Take 50 mg by mouth 2 (two) times daily. 02/18/20  Yes [provider]     Objective    Physical Exam: Vitals:   07/25/20 1930 07/25/20 1945 07/25/20 2015 07/25/20 2104  BP: (!) 137/116   (!) 137/96  Pulse: 67   70  Resp: 15 19 11 17   Temp:    98.1 F (36.7 C)  TempSrc:    Oral  SpO2: 97%   100%  Weight:      Height:        General: appears to be stated age; somnolent; will briefly open her eyes to verbal stimuli, but falls back asleep prior to answering questions posterior; unable to follow instructions Skin: warm, dry, no rash Head:  AT/[place] Mouth:  Oral mucosa membranes appear dry, normal dentition Neck: supple; trachea midline Heart:  RRR; did not appreciate any M/R/G Lungs: CTAB, did not appreciate any wheezes, rales, or rhonchi Abdomen: + BS; soft, ND Vascular: 2+ pedal pulses b/l; 2+ radial pulses b/l Extremities: no peripheral edema, no muscle wasting Neuro: In the setting of the patient's current mental status and associated inability to follow instructions, unable to perform full neurologic exam at this time.  As such, assessment of strength, sensation, and cranial nerves is limited at this time. Patient noted to spontaneously move all 4 extremities. No tremors.     Labs on Admission: I have personally reviewed following labs and imaging studies  CBC: Recent Labs  Lab 07/25/20 2021  WBC 7.0  HGB 10.6*  HCT 32.7*  MCV 86.3  PLT [id number]   Basic Metabolic Panel: Recent Labs  Lab 07/25/20 2021  NA 141  K 4.1  CL 102  CO2 31  GLUCOSE 95  BUN 13  CREATININE 0.83  CALCIUM 9.5   GFR: Estimated Creatinine Clearance: 46.6 mL/min (by C-G formula based on SCr of 0.83 mg/dL). Liver Function Tests: Recent Labs  Lab 07/25/20 2021  AST 21  ALT 14  ALKPHOS 92  BILITOT 0.7  PROT 7.0  ALBUMIN 3.8   Recent Labs  Lab 07/25/20 2021  LIPASE 24   No results for  input(s): AMMONIA in the last 168 hours. Coagulation Profile: No results for input(s): INR, PROTIME in the last 168 hours. Cardiac Enzymes: No results for input(s): CKTOTAL, CKMB, CKMBINDEX, TROPONINI in the last 168 hours. BNP (last 3 results) No results for input(s): PROBNP in the last 8760 hours. HbA1C: No results for input(s): HGBA1C in the last 72 hours. CBG: No results for input(s): GLUCAP in the last 168 hours. Lipid Profile: No results for input(s): CHOL, HDL, LDLCALC, TRIG, CHOLHDL, LDLDIRECT in the last 72 hours. Thyroid Function Tests: No results for input(s): TSH,  T4TOTAL, FREET4, T3FREE, THYROIDAB in the last 72 hours. Anemia Panel: No results for input(s): VITAMINB12, FOLATE, FERRITIN, TIBC, IRON, RETICCTPCT in the last 72 hours. Urine analysis:    Component Value Date/Time   COLORURINE STRAW (A) 07/25/2020 2021   APPEARANCEUR CLEAR (A) 07/25/2020 2021   LABSPEC 1.004 (L) 07/25/2020 2021   PHURINE 9.0 (H) 07/25/2020 2021   GLUCOSEU NEGATIVE 07/25/2020 2021   HGBUR SMALL (A) 07/25/2020 2021   BILIRUBINUR NEGATIVE 07/25/2020 2021   KETONESUR NEGATIVE 07/25/2020 2021   PROTEINUR NEGATIVE 07/25/2020 2021   NITRITE NEGATIVE 07/25/2020 2021   LEUKOCYTESUR NEGATIVE 07/25/2020 2021    Radiological Exams on Admission: CT Head Wo Contrast  Result Date: 07/25/2020 CLINICAL DATA:  Head trauma fall EXAM: CT HEAD WITHOUT CONTRAST TECHNIQUE: Contiguous axial images were obtained from the base of the skull through the vertex without intravenous contrast. COMPARISON:  MRI 03/07/2020, CT 11/27/2005 FINDINGS: Brain: No acute territorial infarction, hemorrhage or intracranial mass. Moderate chronic small vessel ischemic change of the white matter. Chronic right frontal white matter infarct. Stable ventricle size. Vascular: No hyperdense vessel.  Carotid vascular calcification Skull: Normal. Negative for fracture or focal lesion. Sinuses/Orbits: Left frontal ethmoidal osteoma Other: None  IMPRESSION: 1. No CT evidence for acute intracranial abnormality. 2. Chronic small vessel ischemic changes of the white matter Electronically Signed   By: Donavan Foil M.D.   On: 07/25/2020 20:10   DG Chest Portable 1 View  Result Date: 07/25/2020 CLINICAL DATA:  Altered mental status EXAM: PORTABLE CHEST 1 VIEW COMPARISON:  CT 08/27/2012, chest x-ray 12/26/2010 FINDINGS: No focal opacity, consolidation or pleural effusion. Coarse chronic appearing interstitial opacity. Normal heart size. No pneumothorax IMPRESSION: No active disease. Electronically Signed   By: Donavan Foil M.D.   On: 07/25/2020 20:11     EKG: Independently reviewed, with result as described above.    Assessment/Plan   Katrina Simon is a 70 y.o. 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   Principal Problem:   Acute encephalopathy Active Problems:   Chronic pain syndrome   Falls   Emphysema of lung (HCC)   Depression   Chronic anemia     #) Acute encephalopathy: 1 day of progressive confusion and somnolence relative to baseline mental status.  Underlying source not completely clear at this time, although there is suspicion for contribution from polypharmacy as well as the presence of several central acting home medications, including fluoxetine, gabapentin, scheduled olanzapine, scheduled quetiapine, as needed oxycodone immediate release, with reported instructions of taking up to 5 times a day on a as needed basis.  Additionally, patient's son conveys that the patient was recently started on a new medication for treatment of restless leg syndrome, although he is unsure as to the specific identity  of this medication.  I have placed a consult with our inpatient pharmacist for assistance with accurate reconciliation of the patient's home medications, including clarification of any recent additions to this home list.  We will also check CPK level given report of 3 falls over the course the last day, with unclear duration of time in which patient remained on the floor in order to evaluate for rhabdomyolysis and metabolic encephalopathy on this basis.  However, this  is felt to be less likely at the present time, given that it appears that the patient's confusion started prior to onset of her first fall.  Of note, CT head showed no evidence of acute intracranial process, including no evidence of intracranial hemorrhage.  No evidence of underlying infectious process at this time, as urinalysis is inconsistent with UTI, chest x-ray shows no evidence of acute cardiopulmonary process, including no evidence of pneumonia, and COVID-19 PCR performed this evening was found to be negative.  No overt metabolic abnormalities per preliminary laboratory analysis performed in the ED this evening.  We will check VBG to evaluate for any element of hypercapnic encephalopathy given the patient's documented history of COPD with increasing somnolence and confusion over the last day.  Overall, will hold home medications for now to allow for a washout.  Given suspected contributions from her home medication list, while awaiting input from inpatient pharmacy consult, as above, and while pursuing additional evaluation for other sources of acute encephalopathy, as further detailed below.  Acute ischemic stroke, while still in the differential, is felt to be less likely at this time relative to the above.  However, if no alternative explanation for the patient's presenting acute encephalopathy is identified, could consider pursuing MRI of the brain to further evaluate this.  Seizures are also felt to be less likely at this time.  We will  keep patient n.p.o. until mental status improves sufficiently that the patient is able to participate in and pass nursing bedside swallow screen.     Plan: NPO. Nursing bedside swallow evaluation prior to the initiation of a diet/oral medications, as described above. CMP in the morning. Repeat CBC in the morning. check VBG to evaluate for any contribution from hypercapnic encephalopathy. Check TSH. check B12 and CPK level.  Check INR and ammonia level.  Check ionized calcium level.  Inpatient pharmacy consult for assistance with accurate reconciliation of home medications as well as clarification of any recent modifications of this home regimen, as further detailed above.  Holding home medications pending the patient passing nursing bedside swallow screen as well as the result of this inpatient pharmacy consult.  Fall precautions ordered.      #) Falls: Patient son reports the patient is experienced 3 falls over the course of the last day, although the specific nature and circumstances of these falls is unclear, including if the patient experienced any loss of consciousness with these falls.  Suspect a mechanical aspect to these falls in the setting of primary acute encephalopathy, as further detailed above.  Of note, noncontrast CT of the head performed this evening showed no evidence of acute intracranial process.  Plan: Further evaluation management of presenting acute encephalopathy, as further detailed above.  Fall precautions ordered.  Check vitamin B12 level.  Repeat CMP and CBC in the morning.  Check CPK level, as above.       #) COPD: Documented history of such, in the setting of report of the patient being a former smoker, having completely quit smoking in 2014 after a greater than 20-pack-year history of smoking.  In the setting of presenting progressive somnolence and confusion, will check VBG to evaluate for any contribution from hypercapnic encephalopathy.  Overall, no significant  evidence to suggest acute COPD exacerbation at this time.   Plan: Check VBG, as above.  As needed albuterol nebulizer.  Add on serum phosphorus level.  Repeat BMP in the morning.      #) Depression: Outpatient mental health regimen reportedly  includes fluoxetine, gabapentin, scheduled olanzapine, scheduled quetiapine.   Plan: Holding outpatient medications from setting of current n.p.o. status as well as pending inpatient pharmacy consult, as above.      #) Chronic pain syndrome: In the context of a documented history of degenerative disc disease of the thoracic and lumbar spine, the patient is reportedly on chronic opioid therapy, including oxycodone immediate release, prescribed on a as needed basis to be taken up to 5 times a day.  Interestingly, presenting urinary drug screen was negative for opioids.   Plan: Holding home as needed oxycodone for now.  Inpatient pharmacy consult, as above.  Check VBG.      #) Chronic anemia: Patient has a documented history of chronic anemia, baseline hemoglobin reported to be between 10-11.  Presenting hemoglobin of 10.6 consistent with most recent prior value of 10.7 in September 2021.  Presenting hemoglobin associated with normocytic/normochromic findings, without evidence of active bleed.  Plan: Repeat CBC in the morning.  Check INR as a component of evaluation for underlying hepatic dysfunction in the setting of presenting acute encephalopathy.       #) Restless leg syndrome: Patient son conveys that the patient was recently diagnosed with this, and started on an outpatient medication for treatment of such, although he is unsure as to the specific nature of this medication.  Plan: As a component of the inpatient pharmacy consult request that I have placed, I have asked for assistance in identifying the recently initiated outpatient medication is treatment of the patient's RLS.        DVT prophylaxis: SCDs Code Status: Full  code Family Communication: Patient's case was discussed with her son, who was present at bedside Disposition Plan: Per Rounding Team Consults called: none  Admission status: Inpatient; MedSurg     Of note, this patient was added by me to the following Admit List/Treatment Team: armcadmits.      PLEASE NOTE THAT DRAGON DICTATION SOFTWARE WAS USED IN THE CONSTRUCTION OF THIS NOTE.   [facility] Pager [phone] From [place]  Otherwise, please contact night-coverage  [email or web address] Password [facility]   07/25/2020, 10:23 PM

## 2020-07-25 NOTE — Progress Notes (Signed)
Brief note regarding plan, with full H&P to follow:  70 year old female with history of COPD, depression, chronic pain syndrome on chronic opioid therapy as an outpatient, who is admitted for further evaluation and management of acute encephalopathy after presenting with 1 day of confusion/somnolence as well as falls at home.  No overt evidence of underlying infection or metabolic derangement per preliminary labs.  Suspect contribution from polypharmacy as well as impact of several central acting home medications.  Currently n.p.o. pending nursing bedside swallow screen.  Holding all home medications for now to allow for washout period.  Also placed an inpatient pharmacy consultation for assistance with accurate reconciliation of medication list.  History was provided by the patient's son, Katrina Simon.  Also expanded the evaluation for potential metabolic contributions, including checking ionized calcium level, B12 level, ammonia, INR, CPK.  We will also check VBG given history of COPD.  Presenting CT head showed no evidence of acute intracranial process.     Katrina Bertin, DO Hospitalist

## 2020-07-25 NOTE — ED Triage Notes (Signed)
Pt to ED from home c/o AMS.  Pt's son states patient was outside yesterday acting normal, had a fall unknown if hit head, states started having abnormal behavior last night like lying on the floor, dusting in the middle of the night.  Pt with small abrasion to right forearm from fall last night.  Pt does states urinary frequency but denies other urinary symptoms or pain.  Pt alert to self and place but disoriented to time and situation.

## 2020-07-26 DIAGNOSIS — G9340 Encephalopathy, unspecified: Secondary | ICD-10-CM | POA: Diagnosis not present

## 2020-07-26 DIAGNOSIS — E43 Unspecified severe protein-calorie malnutrition: Secondary | ICD-10-CM | POA: Insufficient documentation

## 2020-07-26 DIAGNOSIS — D649 Anemia, unspecified: Secondary | ICD-10-CM | POA: Diagnosis present

## 2020-07-26 DIAGNOSIS — J439 Emphysema, unspecified: Secondary | ICD-10-CM | POA: Diagnosis present

## 2020-07-26 DIAGNOSIS — W19XXXA Unspecified fall, initial encounter: Secondary | ICD-10-CM | POA: Diagnosis present

## 2020-07-26 DIAGNOSIS — F32A Depression, unspecified: Secondary | ICD-10-CM | POA: Diagnosis present

## 2020-07-26 LAB — MAGNESIUM
Magnesium: 1.7 mg/dL (ref 1.7–2.4)
Magnesium: 1.7 mg/dL (ref 1.7–2.4)

## 2020-07-26 LAB — COMPREHENSIVE METABOLIC PANEL
ALT: 13 U/L (ref 0–44)
AST: 18 U/L (ref 15–41)
Albumin: 3.1 g/dL — ABNORMAL LOW (ref 3.5–5.0)
Alkaline Phosphatase: 79 U/L (ref 38–126)
Anion gap: 10 (ref 5–15)
BUN: 12 mg/dL (ref 8–23)
CO2: 27 mmol/L (ref 22–32)
Calcium: 8.8 mg/dL — ABNORMAL LOW (ref 8.9–10.3)
Chloride: 104 mmol/L (ref 98–111)
Creatinine, Ser: 0.67 mg/dL (ref 0.44–1.00)
GFR, Estimated: >60 mL/min (ref >60)
Glucose, Bld: 91 mg/dL (ref 70–99)
Potassium: 4.1 mmol/L (ref 3.5–5.1)
Sodium: 141 mmol/L (ref 135–145)
Total Bilirubin: 0.6 mg/dL (ref 0.3–1.2)
Total Protein: 5.9 g/dL — ABNORMAL LOW (ref 6.5–8.1)

## 2020-07-26 LAB — CBC
HCT: 29.4 % — ABNORMAL LOW (ref 36.0–46.0)
Hemoglobin: 9.4 g/dL — ABNORMAL LOW (ref 12.0–15.0)
MCH: 27.3 pg (ref 26.0–34.0)
MCHC: 32 g/dL (ref 30.0–36.0)
MCV: 85.5 fL (ref 80.0–100.0)
Platelets: 209 10*3/uL (ref 150–400)
RBC: 3.44 MIL/uL — ABNORMAL LOW (ref 3.87–5.11)
RDW: 15.9 % — ABNORMAL HIGH (ref 11.5–15.5)
WBC: 5.8 10*3/uL (ref 4.0–10.5)
nRBC: 0 % (ref 0.0–0.2)

## 2020-07-26 LAB — TSH: TSH: 0.304 u[IU]/mL — ABNORMAL LOW (ref 0.350–4.500)

## 2020-07-26 LAB — URINE DRUG SCREEN, QUALITATIVE (ARMC ONLY)
Amphetamines, Ur Screen: NOT DETECTED
Barbiturates, Ur Screen: NOT DETECTED
Benzodiazepine, Ur Scrn: NOT DETECTED
Cannabinoid 50 Ng, Ur ~~LOC~~: NOT DETECTED
Cocaine Metabolite,Ur ~~LOC~~: NOT DETECTED
MDMA (Ecstasy)Ur Screen: NOT DETECTED
Methadone Scn, Ur: NOT DETECTED
Opiate, Ur Screen: NOT DETECTED
Phencyclidine (PCP) Ur S: NOT DETECTED
Tricyclic, Ur Screen: NOT DETECTED

## 2020-07-26 LAB — T4, FREE: Free T4: 1.04 ng/dL (ref 0.61–1.12)

## 2020-07-26 LAB — AMMONIA: Ammonia: 26 umol/L (ref 9–35)

## 2020-07-26 LAB — HIV ANTIBODY (ROUTINE TESTING W REFLEX): HIV Screen 4th Generation wRfx: NONREACTIVE

## 2020-07-26 LAB — PHOSPHORUS: Phosphorus: 4.7 mg/dL — ABNORMAL HIGH (ref 2.5–4.6)

## 2020-07-26 LAB — CK: Total CK: 76 U/L (ref 38–234)

## 2020-07-26 LAB — VITAMIN B12: Vitamin B-12: 414 pg/mL (ref 180–914)

## 2020-07-26 LAB — PROTIME-INR
INR: 1.1 (ref 0.8–1.2)
Prothrombin Time: 14.3 seconds (ref 11.4–15.2)

## 2020-07-26 LAB — TROPONIN I (HIGH SENSITIVITY): Troponin I (High Sensitivity): 5 ng/L (ref <18)

## 2020-07-26 MED ORDER — OXYCODONE HCL 5 MG PO TABS
5.0000 mg | ORAL_TABLET | Freq: Three times a day (TID) | ORAL | Status: DC | PRN
  Administered 2020-07-26 – 2020-07-28 (×3): 5 mg via ORAL
  Filled 2020-07-26 (×3): qty 1

## 2020-07-26 MED ORDER — OLANZAPINE 5 MG PO TABS
5.0000 mg | ORAL_TABLET | Freq: Every day | ORAL | Status: DC
  Administered 2020-07-26 – 2020-07-27 (×2): 5 mg via ORAL
  Filled 2020-07-26 (×3): qty 1

## 2020-07-26 MED ORDER — ENSURE ENLIVE PO LIQD
237.0000 mL | Freq: Three times a day (TID) | ORAL | Status: DC
  Administered 2020-07-26 – 2020-07-28 (×6): 237 mL via ORAL

## 2020-07-26 MED ORDER — GABAPENTIN 300 MG PO CAPS
300.0000 mg | ORAL_CAPSULE | Freq: Every day | ORAL | Status: DC
  Administered 2020-07-26 – 2020-07-27 (×2): 300 mg via ORAL
  Filled 2020-07-26 (×2): qty 1

## 2020-07-26 MED ORDER — ADULT MULTIVITAMIN W/MINERALS CH
1.0000 | ORAL_TABLET | Freq: Every day | ORAL | Status: DC
  Administered 2020-07-26 – 2020-07-28 (×3): 1 via ORAL
  Filled 2020-07-26 (×3): qty 1

## 2020-07-26 MED ORDER — ENOXAPARIN SODIUM 40 MG/0.4ML IJ SOSY
40.0000 mg | PREFILLED_SYRINGE | INTRAMUSCULAR | Status: DC
  Administered 2020-07-26 – 2020-07-27 (×2): 40 mg via SUBCUTANEOUS
  Filled 2020-07-26 (×2): qty 0.4

## 2020-07-26 MED ORDER — FLUOXETINE HCL 20 MG PO CAPS
40.0000 mg | ORAL_CAPSULE | Freq: Every day | ORAL | Status: DC
  Administered 2020-07-26 – 2020-07-28 (×3): 40 mg via ORAL
  Filled 2020-07-26 (×3): qty 2

## 2020-07-26 NOTE — Progress Notes (Signed)
Initial Nutrition Assessment  DOCUMENTATION CODES:  Severe malnutrition in context of chronic illness,Severe malnutrition in context of social or environmental circumstances  INTERVENTION:  Continue current diet order.  Add Ensure Enlive po TID, each supplement provides 350 kcal and 20 grams of protein.  Add Magic cup TID with meals, each supplement provides 290 kcal and 9 grams of protein.  Add MVI with minerals daily.  NUTRITION DIAGNOSIS:  Severe Malnutrition related to chronic illness,social / environmental circumstances as evidenced by per patient/family report,energy intake < or equal to 75% for > or equal to 1 month,energy intake < or equal to 50% for > or equal to 1 month,severe fat depletion,severe muscle depletion.  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  Spoke with pt at bedside. Pt reports eating a lot less recently, usually ordering pizza ([employer] most often). She does not eat very much else. She reports being extremely depressed and not wanting to eat or cook. Per Epic, she ate 45% of her breakfast this morning.  She reports a 100 lb weight loss in the last 6 months, which is significant and very severe. Per Care Everywhere, in the last 6 months, pt has lost ~5 kg, which is approximately 8.6% of her body weight. This is significant and severe.  On exam, pt has severe depletions in both fat and muscle stores. Pt severely malnourished given the above information.  Recommend Ensure Enlive TID, Magic Cup TID, and MVI with minerals daily to promote caloric and protein  intake.  Medications: reviewed; LR @ 100 ml/hr  Labs: reviewed  NUTRITION - FOCUSED PHYSICAL EXAM: Flowsheet Row Most Recent Value  Orbital Region Severe depletion  Upper Arm Region Severe depletion  Thoracic and Lumbar Region Severe depletion  Buccal Region Severe depletion  Temple Region Severe depletion  Clavicle Bone Region Severe depletion  Clavicle and Acromion Bone Region Severe depletion  Scapular Bone Region Severe depletion  Dorsal Hand Severe depletion  Patellar Region Severe depletion  Anterior Thigh Region Severe depletion  Posterior Calf Region Severe depletion  Edema (RD Assessment) None  Hair Reviewed  Eyes Reviewed  Mouth Reviewed  Skin Reviewed  Nails Reviewed     Diet Order:   Diet Order            Diet regular Room service appropriate? No; Fluid consistency: Thin  Diet effective now                EDUCATION NEEDS:  Education needs have been addressed  Skin:  Skin Assessment: Reviewed RN Assessment (Dry, flaky skin and abrasion on R arm)  Last BM:  07/24/20  Height:  Ht Readings from Last 1 Encounters:  07/25/20 5\' 5"  (1.651 m)   Weight:  Wt Readings from Last 1 Encounters:  07/25/20 51 kg   Ideal Body Weight:  56.8 kg  BMI:  Body mass index is 18.71 kg/m.  Estimated Nutritional Needs:  Kcal:  1850-2050 Protein:  80-95 grams Fluid:  >1.85 L  Derrel Nip, RD, LDN Registered Dietitian After Hours/Weekend Pager # in [place]

## 2020-07-26 NOTE — Plan of Care (Signed)

## 2020-07-26 NOTE — Plan of Care (Signed)

## 2020-07-26 NOTE — TOC Initial Note (Signed)
Transition of Care [facility]) - Initial/Assessment Note    Patient Details  Name: Katrina Simon MRN: [id number] Date of Birth: [birth year]  Transition of Care [facility]) CM/SW Contact:    Shelbie Hutching, RN Phone Number: 07/26/2020, 3:26 PM  Clinical Narrative:                 Patient admitted to the hospital with acute encephalopathy.  RNCM met with patient at the bedside.  Patient reports that she is from home where she lives with her husband.  Patient is independent but she does not drive.  Her husband drives or one of her friends takes her where she needs to go.  Patient is current with [facility] in [place].   Patient has a walker and cane but says she does not use them.  TOC will cont to follow.    Expected Discharge Plan: [place] Barriers to Discharge: Continued Medical Work up   Patient Goals and CMS Choice   CMS Medicare.gov Compare Post Acute Care list provided to:: Patient Choice offered to / list presented to : Patient  Expected Discharge Plan and Services Expected Discharge Plan: [place]   Discharge Planning Services: CM Consult   Living arrangements for the past 2 months: Single Family Home                 DME Arranged: N/A DME Agency: NA                  Prior Living Arrangements/Services Living arrangements for the past 2 months: Single Family Home Lives with:: Spouse Patient language and need for interpreter reviewed:: Yes Do you feel safe going back to the place where you live?: Yes      Need for Family Participation in Patient Care: Yes (Comment) (confusion, AMS) Care giver support system in place?: Yes (comment) (husband and son) Current home services: DME (cane and walker) Criminal Activity/Legal Involvement Pertinent to Current Situation/Hospitalization: No - Comment as needed  Activities of Daily Living Home Assistive Devices/Equipment: None ADL Screening (condition at time of admission) Patient's cognitive  ability adequate to safely complete daily activities?: No Is the patient deaf or have difficulty hearing?: No Does the patient have difficulty seeing, even when wearing glasses/contacts?: No Does the patient have difficulty concentrating, remembering, or making decisions?: No Patient able to express need for assistance with ADLs?: No Does the patient have difficulty dressing or bathing?: No Independently performs ADLs?: No Communication: Needs assistance Is this a change from baseline?: Pre-admission baseline Dressing (OT): Needs assistance Is this a change from baseline?: Pre-admission baseline Grooming: Needs assistance Is this a change from baseline?: Pre-admission baseline Feeding: Needs assistance Is this a change from baseline?: Pre-admission baseline Bathing: Needs assistance Is this a change from baseline?: Pre-admission baseline Toileting: Needs assistance Is this a change from baseline?: Pre-admission baseline In/Out Bed: Needs assistance Is this a change from baseline?: Pre-admission baseline Walks in Home: Needs assistance Is this a change from baseline?: Pre-admission baseline Does the patient have difficulty walking or climbing stairs?: No Weakness of Legs: Both Weakness of Arms/Hands: None  Permission Sought/Granted Permission sought to share information with : Case [place] granted to share information with : Yes, Verbal Permission Granted  Share Information with NAME: Chrissie Noa     Permission granted to share info w Relationship: husband     Emotional Assessment Appearance:: Appears older than stated age Attitude/Demeanor/Rapport: Engaged Affect (typically observed): Accepting Orientation: :  Oriented to Self,Oriented to Place,Oriented to Situation Alcohol / Substance Use: Not Applicable Psych Involvement: No (comment)  Admission diagnosis:  Delirium [R41.0] Acute encephalopathy [G93.40] Patient Active Problem List   Diagnosis Date  Noted  . Falls 07/26/2020  . Protein-calorie malnutrition, severe 07/26/2020  . Emphysema of lung ([place])   . Depression   . Chronic anemia   . Acute encephalopathy 07/25/2020  . Herpes genitalis 10/02/2016  . Chronic pain syndrome 12/06/2014  . Personal history of urinary calculi 12/06/2014  . Esophageal reflux 12/06/2014  . Facet syndrome, lumbar 09/21/2014  . DJD of shoulder 08/23/2014  . DDD (degenerative disc disease), lumbar 07/18/2014  . DDD (degenerative disc disease), thoracolumbar 07/18/2014  . DJD (degenerative joint disease) of knee 07/18/2014  . DJD (degenerative joint disease) shoulder, sacroiliac joint  07/18/2014  . CAFL (chronic airflow limitation) ([place]) 08/24/2013  . Clinical depression 08/24/2013  . Fibromyalgia 08/24/2013  . Arthritis, degenerative 08/18/2013  . Achalasia 06/29/2013  . Adenomatous colon polyp 05/10/2013  . Anxiety disorder 12/29/2006  . Benign paroxysmal positional nystagmus 12/01/2005  . Hypotension 07/04/2005  . Generalized osteoarthrosis, involving multiple sites 03/17/1997   PCP:  [name], MD Pharmacy:   [facility], [place] [address] [address] Phone: [phone] Fax: [phone]  [id number] [place], [id number]: [phone]: [phone]    Social Determinants of Health (SDOH) Interventions    Readmission Risk Interventions No flowsheet data found.

## 2020-07-26 NOTE — Progress Notes (Signed)
Admitted patient from ED, transported via stretcher. Patient is aox2, denies any pain. VSS. Assisted to the [facility], continent of yellow urine. Pt has an abrasion on RT FA, cleansed, no drainage noted. Pt passed Trevor Mace Screen, MD on call notified.  IVF continued. Call bell placed within reach. Orientation given. Bed alarm activated. Will continue to monitor.

## 2020-07-26 NOTE — Progress Notes (Addendum)
PROGRESS NOTE    Katrina Simon  MWN:[id number] DOB: 1950/08/26 DOA: 07/25/2020 PCP: [name], MD  Brief Narrative: 70 year old female with history of COPD, ongoing tobacco abuse, depression, chronic pain/DJD of thoracic and lumbar spine on chronic opiate therapy was brought to [facility] ED on 5/11 for evaluation of confusion and altered mental status by family. -Patient is a very poor historian, no family at bedside to corroborate history per previous documentation he has been more confused the last 1 to 2 days and somnolent as well.  -In the emergency room she was noted to have normal vital signs, labs were notable for normal kidney function, unremarkable CBC LFTs, CT head was unremarkable as well  Assessment & Plan:   Encephalopathy Cognitive deficits, falls -CT head notes chronic small vessel ischemic changes, no acute findings -Patient has significant short-term memory deficits, as well as limited recall in terms of home meds -Polypharmacy presumed to be contributing to her cognitive decline -All sedating meds on hold, called pharmacy and reviewed that her home medication list includes oxycodone 15 mg tabs, gabapentin 600 mg nightly, olanzapine 5 mg nightly, Seroquel 50 mg Qpm etc. -Surprisingly UDS is negative, unclear of its accuracy -Suspect her symptoms are secondary to misuse or withdrawal of sedating meds compounded by some cognitive deficits at baseline -TSH is slightly low but free T4 is normal, ammonia level is normal, B12 is pending -PT OT eval -Will need greater supervision and assistance with meds, patient reports that her spouse works at night and sleeps during the day  COPD Ongoing tobacco abuse -Counseled, no evidence of hypercarbia at this time  Depression -Continue fluoxetine and olanzapine -Seroquel (quetiapine) on hold  Chronic pain -History of chronic low back pain, DJD on oxycodone at baseline followed by pain clinic -Will restart oxycodone at a lower  dose, gabapentin 300mg  QHS -Will need greater supervision with sedating meds at home  Chronic anemia -Stable  DVT prophylaxis: Add Lovenox Code Status: Full code Family Communication: No family at bedside, unable to reach spouse this morning on his cell phone, will try again later Disposition Plan:  Status is: Inpatient  Remains inpatient appropriate because:Inpatient level of care appropriate due to severity of illness   Dispo: The patient is from: Home              Anticipated d/c is to: Home              Patient currently is not medically stable to d/c.   Difficult to place patient No  Consultants:     Procedures:   Antimicrobials:    Subjective: -Feels okay, no specific complaints  Objective: Vitals:   07/25/20 2316 07/25/20 2355 07/26/20 0448 07/26/20 0806  BP: 112/61 (!) 151/71 (!) 118/56 134/78  Pulse: 65 63 (!) 58 80  Resp: 15 16 14 17   Temp: 98.2 F (36.8 C) 98 F (36.7 C) 98.2 F (36.8 C) 98.4 F (36.9 C)  TempSrc: Oral Oral Oral Oral  SpO2: 96% 98% 94% 95%  Weight:  51 kg    Height:  5\' 5"  (1.651 m)      Intake/Output Summary (Last 24 hours) at 07/26/2020 1133 Last data filed at 07/26/2020 1026 Gross per 24 hour  Intake 690.55 ml  Output 700 ml  Net -9.45 ml   Filed Weights   07/25/20 1917 07/25/20 2355  Weight: 46.7 kg 51 kg    Examination:  General exam: Pleasant middle-aged female sitting up in bed, appears older than stated age,  awake alert oriented to self and partly to place, cognitive deficits noted CVS: S1-S2, regular rate rhythm Lungs: Poor air movement bilaterally Abdomen: Soft, nontender, bowel sounds present Extremities: No edema Neuro: Moves all extremities, no localizing signs Skin: No rash on exposed skin Psychiatry: Poor insight    Data Reviewed:   CBC: Recent Labs  Lab 07/25/20 2021 07/26/20 0415  WBC 7.0 5.8  HGB 10.6* 9.4*  HCT 32.7* 29.4*  MCV 86.3 85.5  PLT 228 937   Basic Metabolic Panel: Recent  Labs  Lab 07/25/20 2021 07/26/20 0415  NA 141 141  K 4.1 4.1  CL 102 104  CO2 31 27  GLUCOSE 95 91  BUN 13 12  CREATININE 0.83 0.67  CALCIUM 9.5 8.8*  MG  --  1.7  1.7  PHOS  --  4.7*   GFR: Estimated Creatinine Clearance: 54.2 mL/min (by C-G formula based on SCr of 0.67 mg/dL). Liver Function Tests: Recent Labs  Lab 07/25/20 2021 07/26/20 0415  AST 21 18  ALT 14 13  ALKPHOS 92 79  BILITOT 0.7 0.6  PROT 7.0 5.9*  ALBUMIN 3.8 3.1*   Recent Labs  Lab 07/25/20 2021  LIPASE 24   Recent Labs  Lab 07/26/20 0415  AMMONIA 26   Coagulation Profile: Recent Labs  Lab 07/26/20 0415  INR 1.1   Cardiac Enzymes: Recent Labs  Lab 07/26/20 0415  CKTOTAL 76   BNP (last 3 results) No results for input(s): PROBNP in the last 8760 hours. HbA1C: No results for input(s): HGBA1C in the last 72 hours. CBG: No results for input(s): GLUCAP in the last 168 hours. Lipid Profile: No results for input(s): CHOL, HDL, LDLCALC, TRIG, CHOLHDL, LDLDIRECT in the last 72 hours. Thyroid Function Tests: Recent Labs    07/26/20 0415  TSH 0.304*  FREET4 1.04   Anemia Panel: No results for input(s): VITAMINB12, FOLATE, FERRITIN, TIBC, IRON, RETICCTPCT in the last 72 hours. Urine analysis:    Component Value Date/Time   COLORURINE STRAW (A) 07/25/2020 2021   APPEARANCEUR CLEAR (A) 07/25/2020 2021   LABSPEC 1.004 (L) 07/25/2020 2021   PHURINE 9.0 (H) 07/25/2020 2021   GLUCOSEU NEGATIVE 07/25/2020 2021   HGBUR SMALL (A) 07/25/2020 2021   BILIRUBINUR NEGATIVE 07/25/2020 2021   KETONESUR NEGATIVE 07/25/2020 2021   PROTEINUR NEGATIVE 07/25/2020 2021   NITRITE NEGATIVE 07/25/2020 2021   LEUKOCYTESUR NEGATIVE 07/25/2020 2021   Sepsis Labs: @LABRCNTIP (procalcitonin:4,lacticidven:4)  ) Recent Results (from the past 240 hour(s))  Resp Panel by RT-PCR (Flu A&B, Covid) Nasopharyngeal Swab     Status: None   Collection Time: 07/25/20  8:21 PM   Specimen: Nasopharyngeal Swab;  Nasopharyngeal(NP) swabs in vial transport medium  Result Value Ref Range Status   SARS Coronavirus 2 by RT PCR NEGATIVE NEGATIVE Final    Comment: (NOTE) SARS-CoV-2 target nucleic acids are NOT DETECTED.  The SARS-CoV-2 RNA is generally detectable in upper respiratory specimens during the acute phase of infection. The lowest concentration of SARS-CoV-2 viral copies this assay can detect is 138 copies/mL. A negative result does not preclude SARS-Cov-2 infection and should not be used as the sole basis for treatment or other patient management decisions. A negative result may occur with  improper specimen collection/handling, submission of specimen other than nasopharyngeal swab, presence of viral mutation(s) within the areas targeted by this assay, and inadequate number of viral copies(<138 copies/mL). A negative result must be combined with clinical observations, patient history, and epidemiological information. The expected result is Negative.  Fact Sheet for Patients:  EntrepreneurPulse.com.au  Fact Sheet for Healthcare Providers:  IncredibleEmployment.be  This test is no t yet approved or cleared by the [place] FDA and  has been authorized for detection and/or diagnosis of SARS-CoV-2 by FDA under an Emergency Use Authorization (EUA). This EUA will remain  in effect (meaning this test can be used) for the duration of the COVID-19 declaration under Section 564(b)(1) of the Act, 21 U.S.C.section 360bbb-3(b)(1), unless the authorization is terminated  or revoked sooner.       Influenza A by PCR NEGATIVE NEGATIVE Final   Influenza B by PCR NEGATIVE NEGATIVE Final    Comment: (NOTE) The Xpert Xpress SARS-CoV-2/FLU/RSV plus assay is intended as an aid in the diagnosis of influenza from Nasopharyngeal swab specimens and should not be used as a sole basis for treatment. Nasal washings and aspirates are unacceptable for Xpert Xpress  SARS-CoV-2/FLU/RSV testing.  Fact Sheet for Patients: EntrepreneurPulse.com.au  Fact Sheet for Healthcare Providers: IncredibleEmployment.be  This test is not yet approved or cleared by the [place] FDA and has been authorized for detection and/or diagnosis of SARS-CoV-2 by FDA under an Emergency Use Authorization (EUA). This EUA will remain in effect (meaning this test can be used) for the duration of the COVID-19 declaration under Section 564(b)(1) of the Act, 21 U.S.C. section 360bbb-3(b)(1), unless the authorization is terminated or revoked.  Performed at [facility], [address] [place], [place] [address]          Radiology Studies: CT Head Wo Contrast  Result Date: 07/25/2020 CLINICAL DATA:  Head trauma fall EXAM: CT HEAD WITHOUT CONTRAST TECHNIQUE: Contiguous axial images were obtained from the base of the skull through the vertex without intravenous contrast. COMPARISON:  MRI 03/07/2020, CT 11/27/2005 FINDINGS: Brain: No acute territorial infarction, hemorrhage or intracranial mass. Moderate chronic small vessel ischemic change of the white matter. Chronic right frontal white matter infarct. Stable ventricle size. Vascular: No hyperdense vessel.  Carotid vascular calcification Skull: Normal. Negative for fracture or focal lesion. Sinuses/Orbits: Left frontal ethmoidal osteoma Other: None IMPRESSION: 1. No CT evidence for acute intracranial abnormality. 2. Chronic small vessel ischemic changes of the white matter Electronically Signed   By: Donavan Foil M.D.   On: 07/25/2020 20:10   DG Chest Portable 1 View  Result Date: 07/25/2020 CLINICAL DATA:  Altered mental status EXAM: PORTABLE CHEST 1 VIEW COMPARISON:  CT 08/27/2012, chest x-ray 12/26/2010 FINDINGS: No focal opacity, consolidation or pleural effusion. Coarse chronic appearing interstitial opacity. Normal heart size. No pneumothorax IMPRESSION: No active disease.  Electronically Signed   By: Donavan Foil M.D.   On: 07/25/2020 20:11    Scheduled Meds: Continuous Infusions: . lactated ringers 100 mL/hr at 07/26/20 1007     LOS: 1 day   Time spent: 32min  Domenic Polite, MD [facility] 07/26/2020, 11:33 AM

## 2020-07-27 DIAGNOSIS — G9340 Encephalopathy, unspecified: Secondary | ICD-10-CM | POA: Diagnosis not present

## 2020-07-27 LAB — BLOOD GAS, VENOUS
Acid-Base Excess: 4.6 mmol/L — ABNORMAL HIGH (ref 0.0–2.0)
Bicarbonate: 29.8 mmol/L — ABNORMAL HIGH (ref 20.0–28.0)
O2 Saturation: 96.5 %
Patient temperature: 37
pCO2, Ven: 46 mmHg (ref 44.0–60.0)
pH, Ven: 7.42 (ref 7.250–7.430)
pO2, Ven: 84 mmHg — ABNORMAL HIGH (ref 32.0–45.0)

## 2020-07-27 NOTE — Evaluation (Signed)
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 In addition, pt family reports several recent falls. MD assessment also includes cognitive deficits. CT of the head notes chronic small vessel ischemic changes, no acute findings. "Polypharmacy presumed to be contributing to her cognitive decline". Of note: pt and pt's spouse report seeing a neurologist on an outpatient basis as well d/t concern for dementia as this was present on both the materal and paternal side of her family. Also notable: pt and spouse report that pt has lost significant amount of weight in last 6 months without trying.   Clinical Impression   Pt seen for OT evaluation this date in setting of acute hospitalization d/t recent falls and altered mental status. Pt reports that she is INDEP for basic self care at home and has family assist for higher level cognitive or dynamic tasks such as cooking/cleaning. Pt is however, able to do her own laundry. Pt reports several recent falls and spouse confirms. Pt presents this date with mostly appropriate cognition including able to follow all commands and oriented to basic person, place, time. Did not know how she got to the hospital or why. She is able to follow 1-2 step commands consistently, 3 steps ~50% of the time and unsuccessful with following step commands. Pt is generally weak in trunk and limbs with decreased dynamic standing balance and strength. Pt requires SUPV for standing ADLs and fxl mobility and CGA/MIN A for more dynamic standing tasks such as low-reaching to retrieve an item. Pt left in bed with all needs met and in reach. Pt could benefit from HHOT f/u  to improve safety and balance for dynamic ADL tasks in the natural environment, but pt adamant about not receiving in home help and her spouse reports he can help with transportation. In which case, recommend pt f/u with OPOT to improve safety with standing ADLs/IADLs.     Follow Up Recommendations  Outpatient OT (pt does not want home health, does not want a practitioner in her home)    Equipment Recommendations  None recommended by OT (pt reports having all necessary equipment)    Recommendations for Other Services       Precautions / Restrictions Precautions Precautions: Fall Restrictions Weight Bearing Restrictions: No      Mobility Bed Mobility Overal bed mobility: Modified Independent             General bed mobility comments: HOB elevated, increased time    Transfers Overall transfer level: Needs assistance Equipment used: 1 person hand held assist Transfers: Sit to/from Stand Sit to Stand: Min guard;Supervision         General transfer comment: CGA on first trial, subsequent trials completed with SUPV.    Balance Overall balance assessment: Needs assistance Sitting-balance support: Feet supported Sitting balance-Leahy Scale: Good     Standing balance support: Single extremity supported;No upper extremity supported Standing balance-Leahy Scale: Fair Standing balance comment: F static standing balance w/o UE support, cannot accept challenge, requires at least unilateral support to attempt dynamic reaching.                           ADL   either performed or assessed with clinical judgement   ADL Overall ADL's : Needs assistance/impaired                                       General ADL Comments: requires increased time, has to perform most LB ADLs in sitting d/t poor standing dynamic balance. Pt requires SUPV for standing ADLs, CGA/MIN A for balance with dynamic reaching such as to retreive an item from a low cabinet.      Vision Patient Visual Report: No change from baseline       Perception     Praxis      Pertinent Vitals/Pain Pain Assessment: No/denies pain     Hand Dominance Right   Extremity/Trunk Assessment Upper Extremity Assessment Upper Extremity Assessment: Generalized weakness (ROM WFL, MMT grossly 4-/5. Pt generally atrophied throughout just on a visual assessment)   Lower Extremity Assessment Lower Extremity Assessment: Generalized weakness       Communication Communication Communication: No difficulties   Cognition Arousal/Alertness: Awake/alert Behavior During Therapy: WFL for tasks assessed/performed                                   General Comments: Pt reports gradual cognitive decline which spouse confirms. Pt oriented and appropriate conversationally. Able to follow 1-2 step commands consistently. 3 step commands ~50% of the time. not successful with 4 step commands. Pt reports becoming increasingly aware of instances in which she has difficulty with word finding. Also reports this could be d/t h/o mini strokes.   General Comments       Exercises Other Exercises Other Exercises: OT educates re: role of OT in acute setting, importance of OOB activity, importance of smoking cessation as it pertains to sustaining INDEP with ADLs/ADL mobility. OT gives pt 2 HEP exercises with visual demonstration including modified bed pull ups at bed level for bicep strength and contralateral reaching to improve core strength for balance/fall prevention.   Shoulder Instructions      Home Living Family/patient expects to be discharged to:: Private residence Living Arrangements: Spouse/significant other (spouse's son and grand daughter) Available Help at Discharge: Family;Available PRN/intermittently (pt's spouse works at night, his son is at home at night, they alternate pt supervision) Type of Home: House Home Access: Stairs to enter Entrance Stairs-Number of Steps:  3 Entrance Stairs-Rails:  (report "rail at the top") Home Layout: One level               Home Equipment: Walker - 2 wheels;Cane - single point;Bedside commode;Shower seat          Prior Functioning/Environment Level of Independence: Needs assistance  Gait / Transfers Assistance Needed: Pt reports walking with no AD for short HH distances. States she does not get out much, but when she goes to MD appts, she does not use AD to get from the parking lot. Pt's spouse endorses that she often relies on him for standing support to get to/from appts. ADL's / Homemaking Assistance Needed: Pt reports being INDEP for BADLs. Pt has family assist for IADLs such as cooking/cleaning. Pt does do her own laundry and reports only picking up a few articles at a time to prevent falls.   Comments: endorses several recent falls, especially at night.        OT Problem List: Decreased strength;Impaired balance (sitting   and/or standing);Decreased activity tolerance      OT Treatment/Interventions: Self-care/ADL training;Therapeutic activities;Therapeutic exercise;Patient/family education;DME and/or AE instruction    OT Goals(Current goals can be found in the care plan section) Acute Rehab OT Goals Patient Stated Goal: to go home and figure out what's going on with me OT Goal Formulation: With patient Time For Goal Achievement: 08/10/20 Potential to Achieve Goals: Good ADL Goals Pt Will Transfer to Toilet: with supervision;ambulating (with LRAD to/from restroom with self-initiated rest breaks as needed.) Pt/caregiver will Perform Home Exercise Program: Increased strength;Both right and left upper extremity;With Supervision Additional ADL Goal #1: Pt will participate in dynamic IADL task/simulation with CGA/SUPV with less than/equal to 1 LOB.  OT Frequency: Min 1X/week   Barriers to D/C:            Co-evaluation              AM-PAC OT "6 Clicks" Daily Activity     Outcome Measure Help  from another person eating meals?: None Help from another person taking care of personal grooming?: None Help from another person toileting, which includes using toliet, bedpan, or urinal?: A Little Help from another person bathing (including washing, rinsing, drying)?: A Little Help from another person to put on and taking off regular upper body clothing?: None Help from another person to put on and taking off regular lower body clothing?: A Little 6 Click Score: 21   End of Session Equipment Utilized During Treatment: Gait belt Nurse Communication: Mobility status  Activity Tolerance: Patient tolerated treatment well Patient left: in bed;with call bell/phone within reach;with bed alarm set;with family/visitor present  OT Visit Diagnosis: Unsteadiness on feet (R26.81);Muscle weakness (generalized) (M62.81)                Time: 1150-1218 OT Time Calculation (min): 28 min Charges:  OT General Charges $OT Visit: 1 Visit OT Evaluation $OT Eval Moderate Complexity: 1 Mod OT Treatments $Self Care/Home Management : 8-22 mins   , MS, OTR/L ascom [phone] 07/27/20, 3:16 PM 

## 2020-07-27 NOTE — Progress Notes (Signed)
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 -Patient is a very poor historian, brought to the ED by his son as she was more confused the last 1 to 2 days. Ongoing cognitive decline for 61months to 1 year -In the emergency room she was noted to have normal vital signs, labs were notable for normal kidney function, unremarkable CBC LFTs, CT head was unremarkable as well  Assessment & Plan:   Encephalopathy Cognitive deficits, falls -CT head notes chronic small vessel ischemic changes, no acute findings -Patient has significant short-term memory deficits, as well as limited recall in terms of home meds -Polypharmacy presumed to be contributing to her cognitive decline -All sedating meds held on admission, called pharmacy and reviewed that her home medication list includes oxycodone 15 mg tabs, gabapentin 600 mg nightly, olanzapine 5 mg nightly, Seroquel 50 mg Qpm etc. -Surprisingly UDS is negative, discussed with the patient who tells me she has not taken oxycodone in 3 days -Suspect her symptoms are secondary to misuse or withdrawal of sedating meds compounded by some cognitive deficits at baseline, called and discussed with spouse who tells me about ongoing cognitive and memory decline for 6 months to a year -TSH is slightly low but free T4 is normal, ammonia level is normal, B12 is normal to -PT OT eval pending -Will need greater supervision and assistance with meds, patient reports that her spouse works at night and sleeps during the day -Will need home health RN as well set up at DC  COPD Ongoing tobacco abuse -Counseled, no evidence of hypercarbia at this  time  Depression -Continue fluoxetine and olanzapine -Seroquel (quetiapine) on hold  Chronic pain -History of chronic low back pain, DJD on oxycodone at baseline followed by pain clinic -Restarted oxycodone at lower dose, gabapentin 300mg  QHS -Will need greater supervision with sedating meds at home  Chronic anemia -Stable  DVT prophylaxis: Lovenox Code Status: Full code Family Communication: Called and discussed with spouse yesterday  disposition Plan:  Status is: Inpatient  Remains inpatient appropriate because:Inpatient level of care appropriate due to severity of illness   Dispo: The patient is from: Home              Anticipated d/c is to: Home later today or in a.m.              Patient currently is not medically stable to d/c.   Difficult to place patient No  Consultants:     Procedures:   Antimicrobials:    Subjective: -Feels okay, no specific complaints, not having significant pain despite being on a much lower dose of oxycodone  Objective: Vitals:   07/27/20 0124 07/27/20 0559 07/27/20 0809 07/27/20 1109  BP: 135/70 118/68 (!) 143/75 125/62  Pulse: 72 76 67 78  Resp: 18 14 18 16   Temp: 98.8 F (37.1 C) 99.1 F (37.3 C) 99.2 F (37.3 C) 98.9 F (37.2 C)  TempSrc:   Oral Oral  SpO2: 96% 97% 96% 96%  Weight:  52.3 kg    Height:        Intake/Output Summary (Last 24 hours) at 07/27/2020 1502 Last data filed at 07/27/2020 0606 Gross per 24 hour  Intake --  Output 800 ml  Net -800 ml   Filed Weights   07/25/20 1917 07/25/20 2355 07/27/20 0559  Weight: 46.7 kg 51 kg 52.3 kg    Examination:  General exam: Pleasant, middle-aged female, sitting up in bed, appears much older than stated age, awake alert oriented to self and partly to place, cognitive deficits noted CVS: S1-S2, regular rate rhythm Lungs: Poor air movement bilaterally Abdomen: Soft, nontender, bowel sounds present Extremities: No edema  Neuro: Moves all extremities, no localizing  signs Skin: No rash on exposed skin Psychiatry: Poor insight    Data Reviewed:   CBC: Recent Labs  Lab 07/25/20 2021 07/26/20 0415  WBC 7.0 5.8  HGB 10.6* 9.4*  HCT 32.7* 29.4*  MCV 86.3 85.5  PLT 228 626   Basic Metabolic Panel: Recent Labs  Lab 07/25/20 2021 07/26/20 0415  NA 141 141  K 4.1 4.1  CL 102 104  CO2 31 27  GLUCOSE 95 91  BUN 13 12  CREATININE 0.83 0.67  CALCIUM 9.5 8.8*  MG  --  1.7  1.7  PHOS  --  4.7*   GFR: Estimated Creatinine Clearance: 55.6 mL/min (by C-G formula based on SCr of 0.67 mg/dL). Liver Function Tests: Recent Labs  Lab 07/25/20 2021 07/26/20 0415  AST 21 18  ALT 14 13  ALKPHOS 92 79  BILITOT 0.7 0.6  PROT 7.0 5.9*  ALBUMIN 3.8 3.1*   Recent Labs  Lab 07/25/20 2021  LIPASE 24   Recent Labs  Lab 07/26/20 0415  AMMONIA 26   Coagulation Profile: Recent Labs  Lab 07/26/20 0415  INR 1.1   Cardiac Enzymes: Recent Labs  Lab 07/26/20 0415  CKTOTAL 76   BNP (last 3 results) No results for input(s): PROBNP in the last 8760 hours. HbA1C: No results for input(s): HGBA1C in the last 72 hours. CBG: No results for input(s): GLUCAP in the last 168 hours. Lipid Profile: No results for input(s): CHOL, HDL, LDLCALC, TRIG, CHOLHDL, LDLDIRECT in the last 72 hours. Thyroid Function Tests: Recent Labs    07/26/20 0415  TSH 0.304*  FREET4 1.04   Anemia Panel: Recent Labs    07/25/20 2150  VITAMINB12 414   Urine analysis:    Component Value Date/Time   COLORURINE STRAW (A) 07/25/2020 2021   APPEARANCEUR CLEAR (A) 07/25/2020 2021   LABSPEC 1.004 (L) 07/25/2020 2021   PHURINE 9.0 (H) 07/25/2020 2021   GLUCOSEU NEGATIVE 07/25/2020 2021   HGBUR SMALL (A) 07/25/2020 2021   BILIRUBINUR NEGATIVE 07/25/2020 2021   KETONESUR NEGATIVE 07/25/2020 2021   PROTEINUR NEGATIVE 07/25/2020 2021   NITRITE NEGATIVE 07/25/2020 2021   LEUKOCYTESUR NEGATIVE 07/25/2020 2021   Sepsis  Labs: @LABRCNTIP (procalcitonin:4,lacticidven:4)  ) Recent Results (from the past 240 hour(s))  Resp Panel by RT-PCR (Flu A&B, Covid) Nasopharyngeal Swab     Status: None   Collection Time: 07/25/20  8:21 PM   Specimen: Nasopharyngeal Swab; Nasopharyngeal(NP) swabs in vial transport medium  Result Value Ref Range Status   SARS Coronavirus 2 by RT PCR NEGATIVE NEGATIVE Final    Comment: (NOTE) SARS-CoV-2 target nucleic acids are NOT DETECTED.  The SARS-CoV-2 RNA is generally detectable in upper respiratory specimens during the acute phase of infection. The lowest concentration of SARS-CoV-2 viral copies this assay can detect is 138 copies/mL. A negative result does not preclude SARS-Cov-2 infection and should not be used as the sole basis for treatment or other patient management decisions. A negative result may occur with  improper specimen  collection/handling, submission of specimen other than nasopharyngeal swab, presence of viral mutation(s) within the areas targeted by this assay, and inadequate number of viral copies(<138 copies/mL). A negative result must be combined with clinical observations, patient history, and epidemiological information. The expected result is Negative.  Fact Sheet for Patients:  EntrepreneurPulse.com.au  Fact Sheet for Healthcare Providers:  IncredibleEmployment.be  This test is no t yet approved or cleared by the [place] FDA and  has been authorized for detection and/or diagnosis of SARS-CoV-2 by FDA under an Emergency Use Authorization (EUA). This EUA will remain  in effect (meaning this test can be used) for the duration of the COVID-19 declaration under Section 564(b)(1) of the Act, 21 U.S.C.section 360bbb-3(b)(1), unless the authorization is terminated  or revoked sooner.       Influenza A by PCR NEGATIVE NEGATIVE Final   Influenza B by PCR NEGATIVE NEGATIVE Final    Comment: (NOTE) The Xpert  Xpress SARS-CoV-2/FLU/RSV plus assay is intended as an aid in the diagnosis of influenza from Nasopharyngeal swab specimens and should not be used as a sole basis for treatment. Nasal washings and aspirates are unacceptable for Xpert Xpress SARS-CoV-2/FLU/RSV testing.  Fact Sheet for Patients: EntrepreneurPulse.com.au  Fact Sheet for Healthcare Providers: IncredibleEmployment.be  This test is not yet approved or cleared by the [place] FDA and has been authorized for detection and/or diagnosis of SARS-CoV-2 by FDA under an Emergency Use Authorization (EUA). This EUA will remain in effect (meaning this test can be used) for the duration of the COVID-19 declaration under Section 564(b)(1) of the Act, 21 U.S.C. section 360bbb-3(b)(1), unless the authorization is terminated or revoked.  Performed at [facility], [address], [place], [place] [address]          Radiology Studies: CT Head Wo Contrast  Result Date: 07/25/2020 CLINICAL DATA:  Head trauma fall EXAM: CT HEAD WITHOUT CONTRAST TECHNIQUE: Contiguous axial images were obtained from the base of the skull through the vertex without intravenous contrast. COMPARISON:  MRI 03/07/2020, CT 11/27/2005 FINDINGS: Brain: No acute territorial infarction, hemorrhage or intracranial mass. Moderate chronic small vessel ischemic change of the white matter. Chronic right frontal white matter infarct. Stable ventricle size. Vascular: No hyperdense vessel.  Carotid vascular calcification Skull: Normal. Negative for fracture or focal lesion. Sinuses/Orbits: Left frontal ethmoidal osteoma Other: None IMPRESSION: 1. No CT evidence for acute intracranial abnormality. 2. Chronic small vessel ischemic changes of the white matter Electronically Signed   By: Donavan Foil M.D.   On: 07/25/2020 20:10   DG Chest Portable 1 View  Result Date: 07/25/2020 CLINICAL DATA:  Altered mental status EXAM:  PORTABLE CHEST 1 VIEW COMPARISON:  CT 08/27/2012, chest x-ray 12/26/2010 FINDINGS: No focal opacity, consolidation or pleural effusion. Coarse chronic appearing interstitial opacity. Normal heart size. No pneumothorax IMPRESSION: No active disease. Electronically Signed   By: Donavan Foil M.D.   On: 07/25/2020 20:11    Scheduled Meds: . enoxaparin (LOVENOX) injection  40 mg Subcutaneous Q24H  . feeding supplement  237 mL Oral TID BM  . FLUoxetine  40 mg Oral Daily  . gabapentin  300 mg Oral QHS  . multivitamin with minerals  1 tablet Oral Daily  . OLANZapine  5 mg Oral QHS   Continuous Infusions:    LOS: 2 days   Time spent: 1min  Domenic Polite, MD [facility] 07/27/2020, 3:02 PM

## 2020-07-27 NOTE — Evaluation (Signed)
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 In addition, pt family reports several recent falls. MD assessment also includes cognitive deficits. CT of the head notes chronic small vessel ischemic changes, no acute findings. "Polypharmacy presumed to be contributing to her cognitive decline". Of note: pt and pt's spouse report seeing a neurologist on an outpatient basis as well d/t concern for dementia as this was present on both the materal and paternal side of her family. Also notable: pt and spouse report that pt has lost significant amount of weight in last 6 months without trying. 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 Pt is able to complete bed mobility with mod I. Pt initiates gait without AD with heavy min assist with clearly impaired balance so provided pt with RW. Pt then able to ambulate 2 laps around nurses station with RW & min assist with improved balance. Pt declines stair negotiation on this date. Will continue to follow pt acutely to address balance deficits & for gait training with LRAD & stair training as pt has 3 steps to enter without rails.     Follow Up Recommendations Outpatient PT;Supervision for mobility/OOB    Equipment Recommendations  None recommended by PT (pt reports she already has a RW)    Recommendations for Other Services       Precautions / Restrictions Precautions Precautions: Fall Restrictions Weight Bearing Restrictions: No      Mobility  Bed Mobility Overal bed  mobility: Modified Independent             General bed mobility comments: supine>sit with HOB slightly elevated    Transfers Overall transfer level: Needs assistance Equipment used: None Transfers: Sit to/from Stand Sit to Stand: Min guard;Min assist         General transfer comment: CGA on first trial, subsequent trials completed with SUPV.  Ambulation/Gait Ambulation/Gait assistance: Min assist (pt takes ~3 steps without AD with clear unsteadiness on feet with PT providing RW with improvement in balance) Gait Distance (Feet): 320 Feet Assistive device: Rolling walker (2 wheeled) Gait Pattern/deviations: Decreased step length - right;Decreased step length - left;Decreased stride length Gait velocity: slightly decreased      Stairs Stairs:  (declines on this date)          Wheelchair Mobility    Modified Rankin (Stroke Patients Only)       Balance Overall balance assessment: Needs assistance Sitting-balance support: Feet supported Sitting balance-Leahy Scale: Good     Standing balance support: No upper extremity supported Standing balance-Leahy Scale: Fair Standing balance comment: F static standing balance w/o UE support, cannot accept challenge, requires at least unilateral support to attempt dynamic reaching.                             Pertinent Vitals/Pain Pain Assessment: 0-10 Pain Score: 7  Pain Location: R foot Pain Descriptors / Indicators: Sore Pain Intervention(s): Monitored during session;Limited activity within patient's tolerance    Home Living Family/patient expects  to be discharged to:: Private residence Living Arrangements: Spouse/significant other (spouse's son & granddaughter) Available Help at Discharge: Family;Available PRN/intermittently (spouse works night shift so sleeps during the day) Type of Home: House Home Access: Stairs to enter Entrance Stairs-Rails: None (has porch rail at the top but no rails on sides of  steps) Entrance Stairs-Number of Steps: 3 Home Layout: One level Home Equipment: Liberty - 2 wheels;Cane - single point;Bedside commode;Shower seat      Prior Function Level of Independence: Needs assistance   Gait / Transfers Assistance Needed: Pt reports walking with no AD for short HH distances. States she does not get out much, but when she goes to MD appts, she does not use AD to get from the parking lot. Pt's spouse endorses that she often relies on him for standing support to get to/from appts. (per OT report)    Comments: endorses several recent falls, especially at night.     Hand Dominance   Dominant Hand: Right    Extremity/Trunk Assessment   Upper Extremity Assessment Upper Extremity Assessment: Overall WFL for tasks assessed    Lower Extremity Assessment Lower Extremity Assessment: Generalized weakness       Communication   Communication: No difficulties  Cognition Arousal/Alertness: Awake/alert Behavior During Therapy: WFL for tasks assessed/performed                                   General Comments: Pt reports gradual cognitive decline which spouse confirms. Pt oriented and appropriate conversationally.(per OT report)      General Comments      Exercises    Assessment/Plan    PT Assessment Patient needs continued PT services  PT Problem List Decreased strength;Decreased mobility;Decreased activity tolerance;Decreased balance       PT Treatment Interventions DME instruction;Therapeutic activities;Gait training;Therapeutic exercise;Stair training;Balance training;Patient/family education;Neuromuscular re-education;Functional mobility training    PT Goals (Current goals can be found in the Care Plan section)  Acute Rehab PT Goals Patient Stated Goal: to go home and figure out what's going on with me PT Goal Formulation: With patient Time For Goal Achievement: 08/10/20 Potential to Achieve Goals: Good    Frequency Min 2X/week    Barriers to discharge Decreased caregiver support      Co-evaluation               AM-PAC PT "6 Clicks" Mobility  Outcome Measure Help needed turning from your back to your side while in a flat bed without using bedrails?: None Help needed moving from lying on your back to sitting on the side of a flat bed without using bedrails?: None Help needed moving to and from a bed to a chair (including a wheelchair)?: A Little Help needed standing up from a chair using your arms (e.g., wheelchair or bedside chair)?: A Little Help needed to walk in hospital room?: A Little Help needed climbing 3-5 steps with a railing? : A Lot 6 Click Score: 19    End of Session   Activity Tolerance: Patient tolerated treatment well Patient left: in chair;with call bell/phone within reach;with chair alarm set Nurse Communication: Mobility status PT Visit Diagnosis: Unsteadiness on feet (R26.81);Muscle weakness (generalized) (M62.81);Repeated falls (R29.6)    Time: 1610-9604 PT Time Calculation (min) (ACUTE ONLY): 12 min   Charges:   PT Evaluation $PT Eval Low Complexity: Blanchard, PT, DPT 07/27/20, 3:40  PM   Waunita Schooner 07/27/2020, 3:39 PM

## 2020-07-27 NOTE — Care Management Important Message (Signed)
Important Message  Patient Details  Name: Katrina Simon MRN: [id number] Date of Birth: Sep 14, 1950   Medicare Important Message Given:  Yes     [name] 07/27/2020, 11:35 AM

## 2020-07-28 DIAGNOSIS — G9340 Encephalopathy, unspecified: Secondary | ICD-10-CM | POA: Diagnosis not present

## 2020-07-28 MED ORDER — OXYCODONE HCL 5 MG PO TABS: 5.0000 mg | ORAL_TABLET | Freq: Three times a day (TID) | ORAL | 0 refills | Status: DC | PRN

## 2020-07-28 MED ORDER — GABAPENTIN 300 MG PO CAPS: 300.0000 mg | ORAL_CAPSULE | Freq: Every day | ORAL | Status: DC

## 2020-07-28 NOTE — Discharge Summary (Addendum)
Physician Discharge Summary  Katrina Simon YIR:[id number] DOB: 07-02-1950 DOA: 07/25/2020  PCP: [name], MD  Admit date: 07/25/2020 Discharge date: 07/28/2020  Time spent: 35 minutes  Recommendations for Outpatient Follow-up:  1. PCP in 1 week 2. Pain management Dr. Jacelyn Grip at [facility] pain clinic(narcotic doses significantly decreased) 3. 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 -Patient is a very poor historian, brought to the ED by his son as she was more confused the last 1 to 2 days. Ongoing cognitive decline for 51months to 1 year -In the emergency room she was noted to have normal vital signs, labs were notable for normal kidney function, unremarkable CBC LFTs, CT head was unremarkable as well  Hospital Course:   Encephalopathy Cognitive deficits, falls -History of memory and cognitive deficits for 6 months to 1 year, had worsening confusion 1 to 2 days prior to admission and was brought to the ED  -Work-up was largely unremarkable, imaging of the head noted chronic small vessel ischemic changes, no acute findings, she has significant short-term memory deficits, as well as limited recall in terms of home meds -Polypharmacy presumed to be contributing to her cognitive decline -All sedating meds  held on admission, called pharmacy and reviewed that her home medication list includes oxycodone 15 mg tabs takes 1 tab 4-5times/day, gabapentin 600 mg nightly, olanzapine 5 mg nightly, Seroquel 50 mg Qpm etc. -Surprisingly UDS is negative, discussed with the patient who tells me she has not taken oxycodone in 3 days -Suspect her symptoms are primarily secondary to early dementia, cognitive deficits worsened by disuse or withdrawal of multiple sedating meds, called and discussed with spouse, recommended closer supervision with medications -TSH is slightly low but free T4 is normal, ammonia level is normal, B12 is normal too -PT OT eval  completed, she declined home health therapy, outpatient PT was recommended which she also declined -We restarted her oxycodone at much lower dose i.e. 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 at baseline followed by pain clinic -Restarted oxycodone at lower dose 5 mg 3 times daily as needed instead of basal dose of 15 mg 4-5 times a day, gabapentin 300mg  QHS -Discussed with spouse regarding need for greatest supervision with sedating meds at home   Chronic anemia -Stable   Discharge Exam: Vitals:   07/28/20 0433 07/28/20 0759  BP: 135/60 125/73  Pulse: 68 67  Resp: 16 17  Temp: 98.5 F (36.9 C) 97.6 F (36.4 C)  SpO2: 96% 96%    General: AAOx2, cognitive, short-term memory deficits Cardiovascular:S1S2/RRR Respiratory: CTAB  Discharge Instructions   Discharge Instructions    Diet - low sodium heart healthy    Complete by: As directed    Increase activity slowly   Complete by: As directed    No wound care   Complete by: As directed      Allergies as of 07/28/2020      Reactions   Penicillins Hives   Aspirin Nausea And Vomiting   Statins Other (See Comments)   Myalgia      Medication List    STOP taking these medications   pantoprazole 40 MG tablet Commonly known as: PROTONIX     TAKE these medications   albuterol 108 (90 Base) MCG/ACT inhaler Commonly known as: VENTOLIN HFA Inhale into the lungs every 6 (six) hours as needed for wheezing or shortness of breath.   aspirin EC 81 MG tablet Take 81 mg by mouth daily. Swallow whole.   diclofenac Sodium 1 % Gel Commonly known as: Voltaren Apply 4 g topically 4 (four) times daily.   FLUoxetine 40 MG capsule Commonly known as: PROZAC Take 40 mg by mouth daily.   gabapentin 300 MG capsule Commonly known as: NEURONTIN Take 1 capsule (300 mg total) by mouth at bedtime. What changed: how much to take   OLANZapine 5 MG tablet Commonly known as: ZYPREXA Take 5 mg by mouth daily.   oxyCODONE 5 MG immediate release tablet Commonly known as: Oxy IR/ROXICODONE Take 1 tablet (5 mg total) by mouth every 8 (eight) hours as needed for pain. What changed:   medication strength  how much to take  when to take this  reasons to take this   QUEtiapine 50 MG tablet Commonly known as: SEROQUEL Take 50 mg by mouth 2 (two) times daily.   vitamin B-12 100 MCG tablet Commonly known as: CYANOCOBALAMIN Take 100 mcg by mouth daily.      Allergies  Allergen Reactions  . Penicillins Hives  . Aspirin Nausea And Vomiting  . Statins Other (See Comments)    Myalgia    Follow-up Information    [name], MD. Schedule an appointment as soon as possible for a visit in 1 week(s).   Specialty: Family Medicine Contact information: [address] [place] [address] [phone]                The results of  significant diagnostics from this hospitalization (including imaging, microbiology, ancillary and laboratory) are listed below for reference.    Significant Diagnostic Studies: CT Head Wo Contrast  Result Date: 07/25/2020 CLINICAL DATA:  Head trauma fall EXAM: CT HEAD WITHOUT CONTRAST TECHNIQUE: Contiguous axial images were obtained from the base of the skull through the vertex without intravenous contrast. COMPARISON:  MRI 03/07/2020, CT 11/27/2005 FINDINGS: Brain: No acute territorial infarction, hemorrhage or intracranial mass. Moderate chronic small vessel ischemic change of the white matter. Chronic right frontal white matter infarct. Stable ventricle size. Vascular: No hyperdense vessel.  Carotid vascular calcification Skull: Normal. Negative for fracture or focal lesion. Sinuses/Orbits: Left frontal ethmoidal osteoma Other: None IMPRESSION: 1. No CT evidence for acute intracranial abnormality. 2. Chronic small vessel ischemic changes of the white matter Electronically Signed   By: Donavan Foil M.D.   On: 07/25/2020 20:10   DG Chest Portable 1 View  Result Date:  07/25/2020 CLINICAL DATA:  Altered mental status EXAM: PORTABLE CHEST 1 VIEW COMPARISON:  CT 08/27/2012, chest x-ray 12/26/2010 FINDINGS: No focal opacity, consolidation or pleural effusion. Coarse chronic appearing interstitial opacity. Normal heart size. No pneumothorax IMPRESSION: No active disease. Electronically Signed   By: Donavan Foil M.D.   On: 07/25/2020 20:11    Microbiology: Recent Results (from the past 240 hour(s))  Resp Panel by RT-PCR (Flu A&B, Covid) Nasopharyngeal Swab     Status: None   Collection Time: 07/25/20  8:21 PM   Specimen: Nasopharyngeal Swab; Nasopharyngeal(NP) swabs in vial transport medium  Result Value Ref Range Status   SARS Coronavirus 2 by RT PCR NEGATIVE NEGATIVE Final    Comment: (NOTE) SARS-CoV-2 target nucleic acids are NOT DETECTED.  The SARS-CoV-2 RNA is generally detectable in upper  respiratory specimens during the acute phase of infection. The lowest concentration of SARS-CoV-2 viral copies this assay can detect is 138 copies/mL. A negative result does not preclude SARS-Cov-2 infection and should not be used as the sole basis for treatment or other patient management decisions. A negative result may occur with  improper specimen collection/handling, submission of specimen other than nasopharyngeal swab, presence of viral mutation(s) within the areas targeted by this assay, and inadequate number of viral copies(<138 copies/mL). A negative result must be combined with clinical observations, patient history, and epidemiological information. The expected result is Negative.  Fact Sheet for Patients:  EntrepreneurPulse.com.au  Fact Sheet for Healthcare Providers:  IncredibleEmployment.be  This test is no t yet approved or cleared by the [place] FDA and  has been authorized for detection and/or diagnosis of SARS-CoV-2 by FDA under an Emergency Use Authorization (EUA). This EUA will remain  in effect (meaning this test can be used) for the duration of the COVID-19 declaration under Section 564(b)(1) of the Act, 21 U.S.C.section 360bbb-3(b)(1), unless the authorization is terminated  or revoked sooner.       Influenza A by PCR NEGATIVE NEGATIVE Final   Influenza B by PCR NEGATIVE NEGATIVE Final    Comment: (NOTE) The Xpert Xpress SARS-CoV-2/FLU/RSV plus assay is intended as an aid in the diagnosis of influenza from Nasopharyngeal swab specimens and should not be used as a sole basis for treatment. Nasal washings and aspirates are unacceptable for Xpert Xpress SARS-CoV-2/FLU/RSV testing.  Fact Sheet for Patients: EntrepreneurPulse.com.au  Fact Sheet for Healthcare Providers: IncredibleEmployment.be  This test is not yet approved or cleared by the [place] FDA and has been  authorized for detection and/or diagnosis of SARS-CoV-2 by FDA under an Emergency Use Authorization (EUA). This EUA will remain in effect (meaning this test can be used) for the duration of the COVID-19 declaration under Section 564(b)(1) of the Act, 21 U.S.C. section 360bbb-3(b)(1), unless the authorization is terminated or revoked.  Performed at [facility], [address] [place], [place] [address]      Labs: Basic Metabolic Panel: Recent Labs  Lab 07/25/20 2021 07/26/20 0415  NA 141 141  K 4.1 4.1  CL 102 104  CO2 31 27  GLUCOSE 95 91  BUN 13 12  CREATININE 0.83 0.67  CALCIUM 9.5 8.8*  MG  --  1.7  1.7  PHOS  --  4.7*   Liver Function Tests: Recent Labs  Lab 07/25/20 2021 07/26/20 0415  AST 21 18  ALT 14 13  ALKPHOS 92 79  BILITOT 0.7 0.6  PROT 7.0 5.9*  ALBUMIN 3.8 3.1*   Recent Labs  Lab 07/25/20 2021  LIPASE 24   Recent Labs  Lab 07/26/20 0415  AMMONIA 26   CBC: Recent Labs  Lab 07/25/20 2021 07/26/20 0415  WBC 7.0 5.8  HGB 10.6* 9.4*  HCT 32.7* 29.4*  MCV 86.3 85.5  PLT 228 209   Cardiac Enzymes: Recent Labs  Lab 07/26/20 0415  CKTOTAL 76   BNP: BNP (last 3 results) No results for input(s): BNP in the last 8760 hours.  ProBNP (last 3 results) No results for input(s): PROBNP in the last 8760 hours.  CBG: No results for input(s): GLUCAP in the last 168 hours.     Signed:  Domenic Polite MD.  [facility] 07/28/2020, 2:43 PM

## 2020-07-28 NOTE — Discharge Instructions (Signed)
Confusion Confusion is the inability to think with your usual speed or clarity. Confusion can be caused by many things. People who are confused often describe their thinking as cloudy or unclear. Confusion can also include feeling disoriented. This means you are unaware of where you are or who you are. You may also not know the date or time. When confused, you may have trouble remembering, paying attention, or making decisions. Some people also act aggressively when they are confused. In some cases, confusion may come on quickly. In other cases, it may develop slowly over time. Confusion may be caused by medical conditions such as:  Infections, such as a urinary tract infection (UTI).  Low levels of oxygen, which can develop from conditions such as long-term lung disorders.  Decrease in brain function due to dementia and other conditions that affect the brain, such as seizures, strokes, brain tumors, or head injuries.  Mental health conditions, like panic attacks, anxiety, depression, and hallucinations. Confusion may also be caused by physical factors such as:  Loss of fluid (dehydration) or an imbalance of salts and minerals in the body (electrolytes).  Lack of certain nutrients like niacin, thiamine, or other B vitamins.  Fever or hypothermia, which is a sudden drop in body temperature.  Low or high blood sugar.  Low or high blood pressure. Other causes include:  Lack of sleep or changes in routine or surroundings, such as when traveling or staying in a hospital.  Using too much alcohol, drugs, or medicine.  Side effects of medicines, or taking medicines that affect other medicines (drug interactions). Follow these instructions at home: Pay attention to your symptoms. Tell your health care provider about any changes or if you develop new symptoms. Follow these instructions to control or treat symptoms. Ask a family member or friend for help if needed. Medicines  Take  over-the-counter and prescription medicines only as told by your health care provider.  Ask your health care provider about changing or stopping any medicines that may be causing your confusion.  Avoid pain medicines or sleep medicines until you have fully recovered.  Use a pillbox or an alarm to help you take the right medicines at the right time.   Lifestyle  Eat a balanced diet that includes fruits and vegetables.  Get enough sleep. For most adults, this is 7-9 hours each night.  Do not drink alcohol.  Do not become isolated. Spend time with other people and make plans for your days.  Do not drive until your health care provider says that it is safe to do so.  Do not use any products that contain nicotine or tobacco, such as cigarettes, e-cigarettes, and chewing tobacco. If you need help quitting, ask your health care provider.  Stop other activities that may increase your chances of getting hurt. These may include some work duties, sports activities, swimming, or bike riding. Ask your health care provider what activities are safe for you.   Tips for caregivers  Find out if the person is confused. Ask the person to state his or her name, age, and the date. If the person is unsure or answers incorrectly, he or she may be confused and need assistance.  Always introduce yourself, no matter how well the person knows you. Remind the person of his or her location.  Place a calendar and clock near the person who is confused. Keep a regular schedule. Make sure the person has plenty of light during the day and sleep at night.    Talk about current events and plans for the day.  Keep the environment calm, quiet, and peaceful.  Help the person do the things that he or she is unable to do. These include: ? Taking medicines. ? Keeping medical appointments. ? Helping with household duties, including meal preparation. ? Running errands.  Get help if you need it. There are several support  groups for caregivers. If the person you are helping needs more support, consider day care, extended-care programs, or a skilled nursing facility. The person's health care provider may be able to help evaluate these options. General instructions  Monitor yourself for any conditions you may have. These can include: ? Checking your blood glucose levels if you have diabetes. ? Maintaining a healthy weight. ? Monitoring your blood pressure if you have hypertension. ? Monitoring your body temperature if you have a fever.  Keep all follow-up visits. This is important. Contact a health care provider if:  You have new symptoms or your symptoms get worse. Get help right away if you:  Feel that you are not able to care for yourself.  Develop severe headaches, repeated vomiting, seizures, blackouts, or slurred speech.  Have increasing confusion, weakness, numbness, restlessness, or personality changes.  Develop a loss of balance, have marked dizziness, feel uncoordinated, or fall.  Develop severe anxiety, or you have delusions or hallucinations. These symptoms may represent a serious problem that is an emergency. Do not wait to see if the symptoms will go away. Get medical help right away. Call your local emergency services (911 in the [place] Do not drive yourself to the hospital. Summary  Confusion is the inability to think with your usual speed or clarity. People who are confused often describe their thinking as cloudy or unclear.  Confusion can also include having trouble remembering, paying attention, or making decisions.  Confusion may come on quickly or develop slowly over time, depending on the cause. There are many different causes of confusion.  Ask for help from family members or friends if you are unable to take care of yourself. This information is not intended to replace advice given to you by your health care provider. Make sure you discuss any questions you have with your health  care provider. Document Revised: 06/28/2019 Document Reviewed: 06/28/2019 Elsevier Patient Education  2021 [employer]  

## 2020-07-28 NOTE — TOC Progression Note (Addendum)
Transition of Care [facility]) - Progression Note    Patient Details  Name: Katrina Simon MRN: [id number] Date of Birth: [birth year]  Transition of Care [facility]) CM/SW [place], LCSW Phone Number: 07/28/2020, 10:15 AM  Clinical Narrative:   Per chart, patient is disoriented with memory impairment. CSW called and spoke with patient's husband Katrina Simon regarding Outpatient PT/OT recommendation, he is agreeable. He reported he would like to look over list of options for rehab clinics with patient. CSW will leave list at bedside along with CSW's phone # so they can call CSW with chosen OPPT facility.  11:10- Patient provided with OPPT list. Patient and spouse agreed to call CSW if she wants to use [facility] so referral can be sent. Otherwise she can call to make appointment at other [facility].  Expected Discharge Plan: [place] Barriers to Discharge: Continued Medical Work up  Expected Discharge Plan and Services Expected Discharge Plan: [place]   Discharge Planning Services: CM Consult   Living arrangements for the past 2 months: Single Family Home                 DME Arranged: N/A DME Agency: NA                   Social Determinants of Health (SDOH) Interventions    Readmission Risk Interventions No flowsheet data found.

## 2020-07-29 LAB — CALCIUM, IONIZED: Calcium, Ionized, Serum: 5.1 mg/dL (ref 4.5–5.6)

## 2020-08-01 LAB — METHYLMALONIC ACID, SERUM: Methylmalonic Acid, Quantitative: 180 nmol/L (ref 0–378)

## 2020-08-21 ENCOUNTER — Ambulatory Visit: Payer: Medicare Other | Admitting: Gastroenterology

## 2020-09-15 ENCOUNTER — Other Ambulatory Visit: Payer: Self-pay | Admitting: Podiatry

## 2020-10-01 ENCOUNTER — Ambulatory Visit (INDEPENDENT_AMBULATORY_CARE_PROVIDER_SITE_OTHER): Payer: Medicare Other | Admitting: Gastroenterology

## 2020-10-01 ENCOUNTER — Other Ambulatory Visit: Payer: Self-pay

## 2020-10-01 ENCOUNTER — Encounter: Payer: Self-pay | Admitting: Gastroenterology

## 2020-10-01 VITALS — BP 124/71 | HR 71 | Temp 98.5°F | Ht 65.0 in | Wt 114.2 lb

## 2020-10-01 DIAGNOSIS — D509 Iron deficiency anemia, unspecified: Secondary | ICD-10-CM | POA: Diagnosis not present

## 2020-10-01 DIAGNOSIS — R634 Abnormal weight loss: Secondary | ICD-10-CM

## 2020-10-01 DIAGNOSIS — R195 Other fecal abnormalities: Secondary | ICD-10-CM

## 2020-10-01 MED ORDER — CLENPIQ 10-3.5-12 MG-GM -GM/160ML PO SOLN: 320.0000 mL | Freq: Once | ORAL | 0 refills | Status: AC

## 2020-10-01 NOTE — Progress Notes (Signed)
Cephas Darby, MD [address]  [place]  [place], [place] [address]  Main: [phone]  Fax: [phone]    Gastroenterology Consultation  Referring Provider:     [name], MD Primary Care Physician:  [name], MD Primary Gastroenterologist:  Dr. Cephas Darby Reason for Consultation:     ?  Barrett's esophagus, dysphagia, unintentional weight loss        HPI:   Katrina Simon is a 70 y.o. female referred by Dr. Iona Beard, Rubbie Battiest, MD  for consultation & management of unintentional weight loss, dysphagia.  Patient reports that she has been having difficulty swallowing and she underwent upper endoscopy in 04/2020 at [facility], reported to have Barrett's esophagus, biopsies were performed.  Pathology results are not available with me.  There was no evidence of stricture.  Patient does have history of iron deficiency anemia.  Her B12 and folate levels were normal.  Patient is accompanied by her husband today and he is concerned about recent history of significant weight loss.  She lost about 65 pounds within last several months.  He reports that her appetite has improved and she regained few pounds.  Patient denies any abdominal pain.  However, she does report intermittent loose stools.  She denies any rectal bleeding.  She does report abdominal bloating.  Patient does report spontaneous bruising.  NSAIDs: None  Antiplts/Anticoagulants/Anti thrombotics: None  GI Procedures:  Upper endoscopy 11/19/2016 There were esophageal mucosal changes suspicious for short-segment Barrett's esophagus present in the distal esophagus. The maximum longitudinal extent of these mucosal changes was 2 cm in length. Mucosa was biopsied with a cold forceps for histology. One specimen bottle was sent to pathology. DIAGNOSIS:  A.  DISTAL ESOPHAGUS; COLD BIOPSY:  - GASTRIC TYPE MUCOSA WITH MINIMAL CHRONIC INFLAMMATION.  - NEGATIVE FOR SQUAMOUS MUCOSA, GOBLET CELLS, DYSPLASIA AND  MALIGNANCY.  Colonoscopy 05/10/2013 Diagnosis:  Part A: ASCENDING COLON POLYP COLD BIOPSY:  - TUBULAR ADENOMA, 1 OF 3 FRAGMENTS.  - NEGATIVE FOR HIGH GRADE DYSPLASIA AND MALIGNANCY.  .  Part B: SIGMOID COLON POLYP HOT SNARE:  - HYPERPLASTIC POLYP.  - NEGATIVE FOR DYSPLASIA AND MALIGNANCY.  .  Part C: SIGMOID COLON POLYP COLD BIOPSY:  - HYPERPLASTIC POLYP.  - NEGATIVE FOR DYSPLASIA AND MALIGNANCY.  .  Part D: RECTOSIGMOID COLON POLYP COLD BIOPSY:  - HYPERPLASTIC POLYP, 2 FRAGMENTS.  - NEGATIVE FOR DYSPLASIA AND MALIGNANCY.   Past Medical History:  Diagnosis Date   Achalasia    Allergy    Arthritis    Depression    Emphysema of lung ([place])    Fibromyalgia    GERD (gastroesophageal reflux disease)    Herpes genitalis    History of chicken pox    History of kidney stones     Past Surgical History:  Procedure Laterality Date   CESAREAN SECTION     ESOPHAGOGASTRODUODENOSCOPY (EGD) WITH PROPOFOL N/A 11/19/2016   Procedure: ESOPHAGOGASTRODUODENOSCOPY (EGD) WITH PROPOFOL;  Surgeon: Manya Silvas, MD;  Location: [facility] ENDOSCOPY;  Service: Endoscopy;  Laterality: N/A;   LAPAROSCOPY N/A    done at [facility] to release esophageal muscles    Current Outpatient Medications:    albuterol (PROVENTIL HFA;VENTOLIN HFA) 108 (90 Base) MCG/ACT inhaler, Inhale into the lungs every 6 (six) hours as needed for wheezing or shortness of breath., Disp: , Rfl:    aspirin EC 81 MG tablet, Take 81 mg by mouth daily. Swallow whole., Disp: , Rfl:  diclofenac Sodium (VOLTAREN) 1 % GEL, Apply 4 g topically 4 (four) times daily., Disp: 100 g, Rfl: 3   FLUoxetine (PROZAC) 20 MG capsule, Take by mouth., Disp: , Rfl:    gabapentin (NEURONTIN) 300 MG capsule, Take by mouth., Disp: , Rfl:    naloxone (NARCAN) nasal spray 4 mg/0.1 mL, PLEASE SEE ATTACHED FOR DETAILED DIRECTIONS, Disp: , Rfl:    OLANZapine (ZYPREXA) 5 MG tablet, Take 5 mg by mouth daily., Disp: , Rfl:    Oxycodone HCl 10 MG TABS,  PLEASE SEE ATTACHED FOR DETAILED DIRECTIONS, Disp: , Rfl:    pantoprazole (PROTONIX) 40 MG tablet, Take 40 mg by mouth daily., Disp: , Rfl:    vitamin B-12 (CYANOCOBALAMIN) 100 MCG tablet, Take 100 mcg by mouth daily., Disp: , Rfl:    Sod Picosulfate-Mag Ox-Cit Acd (CLENPIQ) 10-3.5-12 MG-GM -GM/160ML SOLN, Take 320 mLs by mouth once for 1 dose., Disp: 320 mL, Rfl: 0    Family History  Problem Relation Age of Onset   Arthritis Mother    Heart disease Father    Stroke Father      Social History   Tobacco Use   Smoking status: Former    Packs/day: 0.50    Years: 45.00    Pack years: 22.50    Types: Cigarettes    Quit date: 09/16/2012    Years since quitting: 8.0   Smokeless tobacco: Never  Vaping Use   Vaping Use: Some days  Substance Use Topics   Alcohol use: No    Alcohol/week: 0.0 standard drinks   Drug use: No    Allergies as of 10/01/2020 - Review Complete 10/01/2020  Allergen Reaction Noted   Penicillins Hives 07/18/2014   Aspirin Nausea And Vomiting 07/18/2014   Statins Other (See Comments) 07/18/2014    Review of Systems:    All systems reviewed and negative except where noted in HPI.   Physical Exam:  BP 124/71 (BP Location: Left Arm, Patient Position: Sitting, Cuff Size: Normal)   Pulse 71   Temp 98.5 F (36.9 C) (Oral)   Ht 5\' 5"  (1.651 m)   Wt 114 lb 4 oz (51.8 kg)   LMP  (LMP Unknown)   BMI 19.01 kg/m  No LMP recorded (lmp unknown). Patient is postmenopausal.  General:   Alert,  Well-developed, well-nourished, pleasant and cooperative in NAD Head:  Normocephalic and atraumatic. Eyes:  Sclera clear, no icterus.   Conjunctiva pink. Ears:  Normal auditory acuity. Nose:  No deformity, discharge, or lesions. Mouth:  No deformity or lesions,oropharynx pink & moist. Neck:  Supple; no masses or thyromegaly. Lungs:  Respirations even and unlabored.  Clear throughout to auscultation.   No wheezes, crackles, or rhonchi. No acute distress. Heart:  Regular  rate and rhythm; no murmurs, clicks, rubs, or gallops. Abdomen:  Normal bowel sounds. Soft, non-tender and non-distended without masses, hepatosplenomegaly or hernias noted.  No guarding or rebound tenderness.   Rectal: Not performed Msk:  Symmetrical without gross deformities. Good, equal movement & strength bilaterally. Pulses:  Normal pulses noted. Extremities:  No clubbing or edema.  No cyanosis. Neurologic:  Alert and oriented x3;  grossly normal neurologically. Skin:  Intact without significant lesions or rashes. No jaundice. Psych:  Alert and cooperative. Normal mood and affect.  Imaging Studies: No abdominal imaging  Assessment and Plan:   Katrina Simon is a 70 y.o. pleasant Caucasian female with history of dysphagia, iron deficiency anemia, ?  Short segment Barrett's esophagus is seen in consultation  for difficulty swallowing, unexplained weight loss and intermittent loose stools  ?Short segment Barrett's esophagus and dysphagia Patient recently underwent upper endoscopy in 04/2020.  Endoscopy report is available with no evidence of structural lesions identified except for short segment Barrett's esophagus.  Pathology report is not available with me.  Will obtain pathology results Continue pantoprazole 40 mg daily for now If esophageal biopsies are unremarkable, recommend esophageal manometry  Unexplained weight loss and intermittent loose stools Patient has strong tobacco history Recommend pancreatic fecal elastase levels Recommend colonoscopy with TI evaluation and random colon biopsies Check celiac disease panel and vitamin A, E and K levels   Follow up in 3 months   Cephas Darby, MD

## 2020-10-02 ENCOUNTER — Encounter: Payer: Self-pay | Admitting: Internal Medicine

## 2020-10-03 ENCOUNTER — Encounter: Payer: Self-pay | Admitting: Gastroenterology

## 2020-10-03 ENCOUNTER — Ambulatory Visit
Admission: RE | Admit: 2020-10-03 | Discharge: 2020-10-03 | Disposition: A | Discharge status: Home / Self Care | Payer: Medicare Other | Attending: Gastroenterology | Admitting: Gastroenterology

## 2020-10-03 ENCOUNTER — Encounter
Admission: RE | Disposition: A | Discharge status: Home / Self Care | Payer: Self-pay | Source: Home / Self Care | Attending: Gastroenterology

## 2020-10-03 ENCOUNTER — Ambulatory Visit: Payer: Medicare Other | Admitting: Anesthesiology

## 2020-10-03 ENCOUNTER — Other Ambulatory Visit: Payer: Self-pay

## 2020-10-03 DIAGNOSIS — Z79899 Other long term (current) drug therapy: Secondary | ICD-10-CM | POA: Diagnosis not present

## 2020-10-03 DIAGNOSIS — Z79891 Long term (current) use of opiate analgesic: Secondary | ICD-10-CM | POA: Diagnosis not present

## 2020-10-03 DIAGNOSIS — R634 Abnormal weight loss: Secondary | ICD-10-CM

## 2020-10-03 DIAGNOSIS — Z888 Allergy status to other drugs, medicaments and biological substances status: Secondary | ICD-10-CM | POA: Diagnosis not present

## 2020-10-03 DIAGNOSIS — Z886 Allergy status to analgesic agent status: Secondary | ICD-10-CM | POA: Diagnosis not present

## 2020-10-03 DIAGNOSIS — Z880 Allergy status to penicillin: Secondary | ICD-10-CM | POA: Diagnosis not present

## 2020-10-03 DIAGNOSIS — Z87891 Personal history of nicotine dependence: Secondary | ICD-10-CM | POA: Insufficient documentation

## 2020-10-03 DIAGNOSIS — R195 Other fecal abnormalities: Secondary | ICD-10-CM

## 2020-10-03 DIAGNOSIS — K529 Noninfective gastroenteritis and colitis, unspecified: Secondary | ICD-10-CM | POA: Insufficient documentation

## 2020-10-03 DIAGNOSIS — Z681 Body mass index (BMI) 19 or less, adult: Secondary | ICD-10-CM | POA: Diagnosis not present

## 2020-10-03 DIAGNOSIS — Z7982 Long term (current) use of aspirin: Secondary | ICD-10-CM | POA: Diagnosis not present

## 2020-10-03 HISTORY — PX: COLONOSCOPY WITH PROPOFOL: SHX5780

## 2020-10-03 SURGERY — COLONOSCOPY WITH PROPOFOL
Anesthesia: General

## 2020-10-03 MED ORDER — LIDOCAINE HCL (CARDIAC) PF 100 MG/5ML IV SOSY
PREFILLED_SYRINGE | INTRAVENOUS | Status: DC | PRN
  Administered 2020-10-03: 80 mg via INTRAVENOUS
  Administered 2020-10-03: 20 mg via INTRAVENOUS

## 2020-10-03 MED ORDER — CLENPIQ 10-3.5-12 MG-GM -GM/160ML PO SOLN: 320.0000 mL | Freq: Once | ORAL | 0 refills | Status: AC

## 2020-10-03 MED ORDER — SODIUM CHLORIDE 0.9 % IV SOLN
INTRAVENOUS | Status: DC
  Administered 2020-10-03: 1000 mL via INTRAVENOUS

## 2020-10-03 MED ORDER — GOLYTELY 236 G PO SOLR: 4.0000 L | Freq: Once | ORAL | 0 refills | Status: AC

## 2020-10-03 MED ORDER — PROPOFOL 500 MG/50ML IV EMUL
INTRAVENOUS | Status: DC | PRN
  Administered 2020-10-03: 145 ug/kg/min via INTRAVENOUS

## 2020-10-03 MED ORDER — PROPOFOL 10 MG/ML IV BOLUS
INTRAVENOUS | Status: DC | PRN
  Administered 2020-10-03: 30 mg via INTRAVENOUS
  Administered 2020-10-03: 20 mg via INTRAVENOUS

## 2020-10-03 NOTE — H&P (Signed)
Cephas Darby, MD [address]  [place]  [place], [place] [address]  Main: [phone]  Fax: [phone] Pager: [phone]  Primary Care Physician:  [name], MD Primary Gastroenterologist:  Dr. Cephas Darby  Pre-Procedure History & Physical: HPI:  ANASTASSIA NOACK is a 70 y.o. female is here for an colonoscopy.   Past Medical History:  Diagnosis Date   Achalasia    Allergy    Arthritis    Depression    Emphysema of lung ([place])    Fibromyalgia    GERD (gastroesophageal reflux disease)    Herpes genitalis    History of chicken pox    History of kidney stones     Past Surgical History:  Procedure Laterality Date   CESAREAN SECTION     ESOPHAGOGASTRODUODENOSCOPY (EGD) WITH PROPOFOL N/A 11/19/2016   Procedure: ESOPHAGOGASTRODUODENOSCOPY (EGD) WITH PROPOFOL;  Surgeon: Manya Silvas, MD;  Location: [facility] ENDOSCOPY;  Service: Endoscopy;  Laterality: N/A;   LAPAROSCOPY N/A    done at [facility] to release esophageal muscles    Prior to Admission medications   Medication Sig Start Date End Date Taking? Authorizing Provider  albuterol (PROVENTIL HFA;VENTOLIN HFA) 108 (90 Base) MCG/ACT inhaler Inhale into the lungs every 6 (six) hours as needed for wheezing or shortness of breath.    [provider]  aspirin EC 81 MG tablet Take 81 mg by mouth daily. Swallow whole.    [provider]  diclofenac Sodium (VOLTAREN) 1 % GEL Apply 4 g topically 4 (four) times daily. 12/14/19   Hyatt, Max T, DPM  FLUoxetine (PROZAC) 20 MG capsule Take by mouth.    [provider]  gabapentin (NEURONTIN) 300 MG capsule Take by mouth. 12/11/19   [provider]  naloxone (NARCAN) nasal spray 4 mg/0.1 mL PLEASE SEE ATTACHED FOR DETAILED DIRECTIONS 08/21/20   [provider]  OLANZapine (ZYPREXA) 5 MG tablet Take 5 mg by mouth daily. 12/24/18   [provider]  Oxycodone HCl 10 MG TABS PLEASE SEE ATTACHED FOR DETAILED DIRECTIONS  09/19/20   [provider]  pantoprazole (PROTONIX) 40 MG tablet Take 40 mg by mouth daily. 08/01/20   [provider]  vitamin B-12 (CYANOCOBALAMIN) 100 MCG tablet Take 100 mcg by mouth daily.    [provider]    Allergies as of 10/01/2020 - Review Complete 10/01/2020  Allergen Reaction Noted   Penicillins Hives 07/18/2014   Aspirin Nausea And Vomiting 07/18/2014   Statins Other (See Comments) 07/18/2014    Family History  Problem Relation Age of Onset   Arthritis Mother    Heart disease Father    Stroke Father     Social History   Socioeconomic History   Marital status: Married    Spouse name: Not on file   Number of children: Not on file   Years of education: Not on file   Highest education level: Not on file  Occupational History   Not on file  Tobacco Use   Smoking status: Former    Packs/day: 0.50    Years: 45.00    Pack years: 22.50    Types: Cigarettes    Quit date: 09/16/2012    Years since quitting: 8.0   Smokeless tobacco: Never  Vaping Use   Vaping Use: Some days  Substance and Sexual Activity   Alcohol use: No    Alcohol/week: 0.0 standard drinks   Drug use: No   Sexual activity: Yes    Birth  control/protection: Post-menopausal  Other Topics Concern   Not on file  Social History Narrative   Not on file   Social Determinants of Health   Financial Resource Strain: Not on file  Food Insecurity: Not on file  Transportation Needs: Not on file  Physical Activity: Not on file  Stress: Not on file  Social Connections: Not on file  Intimate Partner Violence: Not on file    Review of Systems: See HPI, otherwise negative ROS  Physical Exam: BP (!) 150/75   Pulse 63   Temp (!) 97.1 F (36.2 C)   Resp 16   Ht 5\' 5"  (1.651 m)   Wt 114 lb 3.5 oz (51.8 kg)   LMP  (LMP Unknown)   SpO2 97%   BMI 19.01 kg/m  General:   Alert,  pleasant and cooperative in NAD Head:  Normocephalic and atraumatic. Neck:  Supple; no masses  or thyromegaly. Lungs:  Clear throughout to auscultation.    Heart:  Regular rate and rhythm. Abdomen:  Soft, nontender and nondistended. Normal bowel sounds, without guarding, and without rebound.   Neurologic:  Alert and  oriented x4;  grossly normal neurologically.  Impression/Plan: SOFIYA EZELLE is here for an colonoscopy to be performed for Unexplained weight loss and intermittent loose stools  Risks, benefits, limitations, and alternatives regarding  colonoscopy have been reviewed with the patient.  Questions have been answered.  All parties agreeable.   Sherri Sear, MD  10/03/2020, 9:59 AM

## 2020-10-03 NOTE — Transfer of Care (Signed)
Immediate Anesthesia Transfer of Care Note  Patient: Katrina Simon  Procedure(s) Performed: COLONOSCOPY WITH PROPOFOL  Patient Location: Endoscopy Unit  Anesthesia Type:General  Level of Consciousness: drowsy and patient cooperative  Airway & Oxygen Therapy: Patient Spontanous Breathing and Patient connected to face mask oxygen  Post-op Assessment: Report given to RN and Post -op Vital signs reviewed and stable  Post vital signs: Reviewed and stable  Last Vitals:  Vitals Value Taken Time  BP 103/58 10/03/20 1035  Temp    Pulse 58 10/03/20 1039  Resp 13 10/03/20 1039  SpO2 100 % 10/03/20 1039  Vitals shown include unvalidated device data.  Last Pain:  Vitals:   10/03/20 1035  PainSc: 0-No pain         Complications: No notable events documented.

## 2020-10-03 NOTE — Anesthesia Procedure Notes (Signed)
Procedure Name: General with mask airway Date/Time: 10/03/2020 10:25 AM Performed by: Kelton Pillar, CRNA Pre-anesthesia Checklist: Patient identified, Emergency Drugs available, Suction available and Patient being monitored Patient Re-evaluated:Patient Re-evaluated prior to induction Oxygen Delivery Method: Simple face mask Induction Type: IV induction Placement Confirmation: positive ETCO2 and CO2 detector Dental Injury: Teeth and Oropharynx as per pre-operative assessment

## 2020-10-03 NOTE — Anesthesia Preprocedure Evaluation (Signed)
Anesthesia Evaluation  Patient identified by MRN, date of birth, ID band Patient awake    Reviewed: Allergy & Precautions, NPO status , Patient's Chart, lab work & pertinent test results  History of Anesthesia Complications Negative for: history of anesthetic complications  Airway Mallampati: II  TM Distance: >3 FB Neck ROM: full    Dental  (+) Upper Dentures, Lower Dentures   Pulmonary COPD,  COPD inhaler, Patient abstained from smoking., former smoker,    Pulmonary exam normal        Cardiovascular Exercise Tolerance: Good (-) anginanegative cardio ROS Normal cardiovascular exam     Neuro/Psych PSYCHIATRIC DISORDERS Depression  Neuromuscular disease negative psych ROS   GI/Hepatic Neg liver ROS, GERD  Medicated and Controlled,  Endo/Other  negative endocrine ROS  Renal/GU Renal disease  negative genitourinary   Musculoskeletal  (+) Arthritis , Osteoarthritis,  Fibromyalgia -  Abdominal Normal abdominal exam  (+)   Peds  Hematology negative hematology ROS (+)   Anesthesia Other Findings Past Medical History: No date: Achalasia No date: Allergy No date: Arthritis No date: Depression No date: Emphysema of lung ([facility]) No date: Fibromyalgia No date: GERD (gastroesophageal reflux disease) No date: Herpes genitalis No date: History of chicken pox No date: History of kidney stones  Past Surgical History: No date: CESAREAN SECTION 11/19/2016: ESOPHAGOGASTRODUODENOSCOPY (EGD) WITH PROPOFOL; N/A     Comment:  Procedure: ESOPHAGOGASTRODUODENOSCOPY (EGD) WITH               PROPOFOL;  Surgeon: Manya Silvas, MD;  Location:               [facility] ENDOSCOPY;  Service: Endoscopy;  Laterality: N/A; No date: LAPAROSCOPY; N/A     Comment:  done at [facility] to release esophageal muscles  BMI    Body Mass Index: 19.01 kg/m      Reproductive/Obstetrics negative OB ROS                              Anesthesia Physical  Anesthesia Plan  ASA: 3  Anesthesia Plan: General   Post-op Pain Management:    Induction: Intravenous  PONV Risk Score and Plan: Propofol infusion and TIVA  Airway Management Planned: Nasal Cannula  Additional Equipment:   Intra-op Plan:   Post-operative Plan:   Informed Consent: I have reviewed the patients History and Physical, chart, labs and discussed the procedure including the risks, benefits and alternatives for the proposed anesthesia with the patient or authorized representative who has indicated his/her understanding and acceptance.     Dental advisory given  Plan Discussed with: CRNA and Surgeon  Anesthesia Plan Comments: (Patient consented for risks of anesthesia including but not limited to:  - adverse reactions to medications - risk of airway placement if required - damage to eyes, teeth, lips or other oral mucosa - nerve damage due to positioning  - sore throat or hoarseness - Damage to heart, brain, nerves, lungs, other parts of body or loss of life  Patient voiced understanding.)        Anesthesia Quick Evaluation

## 2020-10-03 NOTE — Anesthesia Postprocedure Evaluation (Signed)
Anesthesia Post Note  Patient: Katrina Simon  Procedure(s) Performed: COLONOSCOPY WITH PROPOFOL  Patient location during evaluation: Endoscopy Anesthesia Type: General Level of consciousness: awake and alert Pain management: pain level controlled Vital Signs Assessment: post-procedure vital signs reviewed and stable Respiratory status: spontaneous breathing, nonlabored ventilation, respiratory function stable and patient connected to nasal cannula oxygen Cardiovascular status: blood pressure returned to baseline and stable Postop Assessment: no apparent nausea or vomiting Anesthetic complications: no   No notable events documented.   Last Vitals:  Vitals:   10/03/20 1055 10/03/20 1105  BP: 134/68 (!) 150/80  Pulse: 77 65  Resp: 15 11  Temp:    SpO2: 99% 99%    Last Pain:  Vitals:   10/03/20 1105  PainSc: 0-No pain                 Precious Haws Tishara Pizano

## 2020-10-03 NOTE — Op Note (Signed)
[facility]astroenterology Patient Name: Katrina Simon Procedure Date: 10/03/2020 10:21 AM MRN: [id number] Account #: [id number] Date of Birth: September 02, 1950 Admit Type: Outpatient Age: 70 Room: [facility] ENDO ROOM 4 Gender: Female Note Status: Finalized Procedure:             Colonoscopy Indications:           Last colonoscopy: February 2015, Chronic diarrhea,                         Weight loss Providers:             Lin Landsman MD, MD Referring MD:          Rubbie Battiest. Iona Beard MD, MD (Referring MD) Medicines:             General Anesthesia Complications:         No immediate complications. Estimated blood loss: None. Procedure:             Pre-Anesthesia Assessment:                        - Prior to the procedure, a History and Physical was                         performed, and patient medications and allergies were                         reviewed. The patient is competent. The risks and                         benefits of the procedure and the sedation options and                         risks were discussed with the patient. All questions                         were answered and informed consent was obtained.                         Patient identification and proposed procedure were                         verified by the physician, the nurse, the                         anesthesiologist, the anesthetist and the technician                         in the pre-procedure area in the procedure room in the                         endoscopy suite. Mental Status Examination: alert and                         oriented. Airway Examination: normal oropharyngeal                         airway and neck mobility. Respiratory Examination:  clear to auscultation. CV Examination: normal.                         Prophylactic Antibiotics: The patient does not require                         prophylactic antibiotics. Prior Anticoagulants: The                          patient has taken no previous anticoagulant or                         antiplatelet agents. ASA Grade Assessment: III - A                         patient with severe systemic disease. After reviewing                         the risks and benefits, the patient was deemed in                         satisfactory condition to undergo the procedure. The                         anesthesia plan was to use general anesthesia.                         Immediately prior to administration of medications,                         the patient was re-assessed for adequacy to receive                         sedatives. The heart rate, respiratory rate, oxygen                         saturations, blood pressure, adequacy of pulmonary                         ventilation, and response to care were monitored                         throughout the procedure. The physical status of the                         patient was re-assessed after the procedure.                        After obtaining informed consent, the colonoscope was                         passed under direct vision. Throughout the procedure,                         the patient's blood pressure, pulse, and oxygen                         saturations were monitored continuously. The  Colonoscope was introduced through the anus with the                         intention of advancing to the cecum. The scope was                         advanced to the rectum before the procedure was                         aborted. Medications were given. The colonoscopy was                         performed without difficulty. The colonoscopy was                         technically difficult and complex due to inadequate                         bowel prep. The patient tolerated the procedure well.                         The quality of the bowel preparation was poor. Findings:      The perianal and digital rectal examinations were normal.  Pertinent       negatives include normal sphincter tone and no palpable rectal lesions.      Copious quantities of semi-solid stool was found in the rectum and in       the recto-sigmoid colon, precluding visualization. Impression:            - Preparation of the colon was poor.                        - Stool in the rectum and in the recto-sigmoid colon.                        - No specimens collected. Recommendation:        - Discharge patient to home (with spouse).                        - Clear liquid diet today.                        - Repeat colonoscopy tomorrow or next available with 2                         day prep because the bowel preparation was poor.                        - Return to my office as previously scheduled. Procedure Code(s):     --- Professional ---                        [phone], 53, Colonoscopy, flexible; diagnostic,                         including collection of specimen(s) by brushing or                         washing, when  performed (separate procedure) Diagnosis Code(s):     --- Professional ---                        K52.9, Noninfective gastroenteritis and colitis,                         unspecified                        R63.4, Abnormal weight loss CPT copyright 2019 American Medical Association. All rights reserved. The codes documented in this report are preliminary and upon coder review may  be revised to meet current compliance requirements. Dr. Ulyess Mort Lin Landsman MD, MD 10/03/2020 10:34:02 AM This report has been signed electronically. Number of Addenda: 0 Note Initiated On: 10/03/2020 10:21 AM Total Procedure Duration: 0 hours 1 minute 48 seconds  Estimated Blood Loss:  Estimated blood loss: none.      [facility]

## 2020-10-04 ENCOUNTER — Encounter: Payer: Self-pay | Admitting: Gastroenterology

## 2020-10-04 ENCOUNTER — Ambulatory Visit: Payer: Medicare Other | Admitting: Anesthesiology

## 2020-10-04 ENCOUNTER — Encounter
Admission: RE | Disposition: A | Discharge status: Home / Self Care | Payer: Self-pay | Source: Home / Self Care | Attending: Gastroenterology

## 2020-10-04 ENCOUNTER — Other Ambulatory Visit: Payer: Self-pay

## 2020-10-04 ENCOUNTER — Ambulatory Visit
Admission: RE | Admit: 2020-10-04 | Discharge: 2020-10-04 | Disposition: A | Discharge status: Home / Self Care | Payer: Medicare Other | Attending: Gastroenterology | Admitting: Gastroenterology

## 2020-10-04 DIAGNOSIS — R195 Other fecal abnormalities: Secondary | ICD-10-CM | POA: Diagnosis not present

## 2020-10-04 DIAGNOSIS — Z7982 Long term (current) use of aspirin: Secondary | ICD-10-CM | POA: Insufficient documentation

## 2020-10-04 DIAGNOSIS — Z79899 Other long term (current) drug therapy: Secondary | ICD-10-CM | POA: Insufficient documentation

## 2020-10-04 DIAGNOSIS — K5730 Diverticulosis of large intestine without perforation or abscess without bleeding: Secondary | ICD-10-CM | POA: Insufficient documentation

## 2020-10-04 DIAGNOSIS — Z888 Allergy status to other drugs, medicaments and biological substances status: Secondary | ICD-10-CM | POA: Insufficient documentation

## 2020-10-04 DIAGNOSIS — R197 Diarrhea, unspecified: Secondary | ICD-10-CM | POA: Insufficient documentation

## 2020-10-04 DIAGNOSIS — Z681 Body mass index (BMI) 19 or less, adult: Secondary | ICD-10-CM | POA: Diagnosis not present

## 2020-10-04 DIAGNOSIS — R634 Abnormal weight loss: Secondary | ICD-10-CM | POA: Insufficient documentation

## 2020-10-04 DIAGNOSIS — Z886 Allergy status to analgesic agent status: Secondary | ICD-10-CM | POA: Diagnosis not present

## 2020-10-04 DIAGNOSIS — Z87891 Personal history of nicotine dependence: Secondary | ICD-10-CM | POA: Insufficient documentation

## 2020-10-04 HISTORY — PX: COLONOSCOPY WITH PROPOFOL: SHX5780

## 2020-10-04 SURGERY — COLONOSCOPY WITH PROPOFOL
Anesthesia: General | Site: Rectum

## 2020-10-04 MED ORDER — SODIUM CHLORIDE 0.9 % IV SOLN: INTRAVENOUS | Status: DC

## 2020-10-04 MED ORDER — LACTATED RINGERS IV SOLN: INTRAVENOUS | Status: DC

## 2020-10-04 MED ORDER — STERILE WATER FOR IRRIGATION IR SOLN
Status: DC | PRN
  Administered 2020-10-04: .05 mL

## 2020-10-04 MED ORDER — ACETAMINOPHEN 325 MG PO TABS: 325.0000 mg | ORAL_TABLET | Freq: Once | ORAL | Status: DC

## 2020-10-04 MED ORDER — PROPOFOL 10 MG/ML IV BOLUS
INTRAVENOUS | Status: DC | PRN
  Administered 2020-10-04 (×4): 20 mg via INTRAVENOUS
  Administered 2020-10-04: 40 mg via INTRAVENOUS
  Administered 2020-10-04: 20 mg via INTRAVENOUS
  Administered 2020-10-04: 30 mg via INTRAVENOUS
  Administered 2020-10-04: 120 mg via INTRAVENOUS
  Administered 2020-10-04: 30 mg via INTRAVENOUS

## 2020-10-04 MED ORDER — ACETAMINOPHEN 160 MG/5ML PO SOLN: 325.0000 mg | Freq: Once | ORAL | Status: DC

## 2020-10-04 SURGICAL SUPPLY — 7 items
FORCEPS BIOP RAD 4 LRG CAP 4 (CUTTING FORCEPS) ×1 IMPLANT
GOWN CVR UNV OPN BCK APRN NK (MISCELLANEOUS) ×2 IMPLANT
GOWN ISOL THUMB LOOP REG UNIV (MISCELLANEOUS) ×4
KIT PRC NS LF DISP ENDO (KITS) ×1 IMPLANT
KIT PROCEDURE OLYMPUS (KITS) ×2
MANIFOLD NEPTUNE II (INSTRUMENTS) ×2 IMPLANT
WATER STERILE IRR 250ML POUR (IV SOLUTION) ×2 IMPLANT

## 2020-10-04 NOTE — Op Note (Signed)
[facility] Gastroenterology Patient Name: Katrina Simon Procedure Date: 10/04/2020 11:14 AM MRN: [id number] Account #: [id number] Date of Birth: October 13, 1950 Admit Type: Outpatient Age: 70 Room: [facility] OR ROOM 01 Gender: Female Note Status: Finalized Procedure:             Colonoscopy Indications:           Clinically significant diarrhea of unexplained origin,                         Weight loss Providers:             Lin Landsman MD, MD Referring MD:          Rubbie Battiest. Iona Beard MD, MD (Referring MD) Medicines:             General Anesthesia Complications:         No immediate complications. Estimated blood loss: None. Procedure:             Pre-Anesthesia Assessment:                        - Prior to the procedure, a History and Physical was                         performed, and patient medications and allergies were                         reviewed. The patient is competent. The risks and                         benefits of the procedure and the sedation options and                         risks were discussed with the patient. All questions                         were answered and informed consent was obtained.                         Patient identification and proposed procedure were                         verified by the physician, the nurse, the                         anesthesiologist, the anesthetist and the technician                         in the pre-procedure area in the procedure room in the                         endoscopy suite. Mental Status Examination: alert and                         oriented. Airway Examination: normal oropharyngeal                         airway and neck mobility. Respiratory Examination:  clear to auscultation. CV Examination: normal.                         Prophylactic Antibiotics: The patient does not require                         prophylactic antibiotics. Prior Anticoagulants: The                          patient has taken no previous anticoagulant or                         antiplatelet agents. ASA Grade Assessment: III - A                         patient with severe systemic disease. After reviewing                         the risks and benefits, the patient was deemed in                         satisfactory condition to undergo the procedure. The                         anesthesia plan was to use general anesthesia.                         Immediately prior to administration of medications,                         the patient was re-assessed for adequacy to receive                         sedatives. The heart rate, respiratory rate, oxygen                         saturations, blood pressure, adequacy of pulmonary                         ventilation, and response to care were monitored                         throughout the procedure. The physical status of the                         patient was re-assessed after the procedure.                        After obtaining informed consent, the colonoscope was                         passed under direct vision. Throughout the procedure,                         the patient's blood pressure, pulse, and oxygen                         saturations were monitored continuously. The  Colonoscope was introduced through the anus and                         advanced to the the terminal ileum, with                         identification of the appendiceal orifice and IC                         valve. The colonoscopy was performed with moderate                         difficulty due to significant looping and the                         patient's body habitus. Successful completion of the                         procedure was aided by applying abdominal pressure.                         The patient tolerated the procedure well. The quality                         of the bowel preparation was fair. Findings:      The  perianal and digital rectal examinations were normal. Pertinent       negatives include normal sphincter tone and no palpable rectal lesions.      Normal mucosa was found in the entire colon. Biopsies for histology were       taken with a cold forceps from the entire colon for evaluation of       microscopic colitis.      The terminal ileum appeared normal.      The retroflexed view of the distal rectum and anal verge was normal and       showed no anal or rectal abnormalities.      A few diverticula were found in the sigmoid colon. Impression:            - Preparation of the colon was fair.                        - Normal mucosa in the entire examined colon. Biopsied.                        - The examined portion of the ileum was normal.                        - The distal rectum and anal verge are normal on                         retroflexion view.                        - Diverticulosis in the sigmoid colon. Recommendation:        - Discharge patient to home (with escort).                        - Resume previous diet today.                        -  Continue present medications.                        - Await pathology results.                        - Return to my office as previously scheduled. Procedure Code(s):     --- Professional ---                        [phone], Colonoscopy, flexible; with biopsy, single or                         multiple Diagnosis Code(s):     --- Professional ---                        R19.7, Diarrhea, unspecified                        R63.4, Abnormal weight loss                        K57.30, Diverticulosis of large intestine without                         perforation or abscess without bleeding CPT copyright 2019 [employer]. All rights reserved. The codes documented in this report are preliminary and upon coder review may  be revised to meet current compliance requirements. Dr. Ulyess Mort Lin Landsman MD, MD 10/04/2020  11:41:42 AM This report has been signed electronically. Number of Addenda: 0 Note Initiated On: 10/04/2020 11:14 AM Scope Withdrawal Time: 0 hours 10 minutes 4 seconds  Total Procedure Duration: 0 hours 18 minutes 51 seconds  Estimated Blood Loss:  Estimated blood loss: none.      [facility]

## 2020-10-04 NOTE — Transfer of Care (Signed)
Immediate Anesthesia Transfer of Care Note  Patient: Katrina Simon  Procedure(s) Performed: COLONOSCOPY WITH PROPOFOL (Rectum)  Patient Location: PACU  Anesthesia Type: General  Level of Consciousness: awake, alert  and patient cooperative  Airway and Oxygen Therapy: Patient Spontanous Breathing and Patient connected to supplemental oxygen  Post-op Assessment: Post-op Vital signs reviewed, Patient's Cardiovascular Status Stable, Respiratory Function Stable, Patent Airway and No signs of Nausea or vomiting  Post-op Vital Signs: Reviewed and stable  Complications: No notable events documented.

## 2020-10-04 NOTE — Anesthesia Postprocedure Evaluation (Signed)
Anesthesia Post Note  Patient: Katrina Simon  Procedure(s) Performed: COLONOSCOPY WITH PROPOFOL (Rectum)     Patient location during evaluation: PACU Anesthesia Type: General Level of consciousness: awake and alert and oriented Pain management: satisfactory to patient Vital Signs Assessment: post-procedure vital signs reviewed and stable Respiratory status: spontaneous breathing, nonlabored ventilation and respiratory function stable Cardiovascular status: blood pressure returned to baseline and stable Postop Assessment: Adequate PO intake and No signs of nausea or vomiting Anesthetic complications: no   No notable events documented.  Raliegh Ip

## 2020-10-04 NOTE — Anesthesia Preprocedure Evaluation (Signed)
Anesthesia Evaluation  Patient identified by MRN, date of birth, ID band Patient awake    Reviewed: Allergy & Precautions, H&P , NPO status , Patient's Chart, lab work & pertinent test results  Airway Mallampati: II  TM Distance: >3 FB Neck ROM: full    Dental no notable dental hx.    Pulmonary COPD, former smoker,    Pulmonary exam normal breath sounds clear to auscultation       Cardiovascular Normal cardiovascular exam Rhythm:regular Rate:Normal     Neuro/Psych PSYCHIATRIC DISORDERS  Neuromuscular disease    GI/Hepatic GERD  ,  Endo/Other    Renal/GU      Musculoskeletal   Abdominal   Peds  Hematology   Anesthesia Other Findings   Reproductive/Obstetrics                             Anesthesia Physical Anesthesia Plan  ASA: 3  Anesthesia Plan: General   Post-op Pain Management:    Induction: Intravenous  PONV Risk Score and Plan: 3 and Treatment may vary due to age or medical condition, Propofol infusion and TIVA  Airway Management Planned: Natural Airway  Additional Equipment:   Intra-op Plan:   Post-operative Plan:   Informed Consent: I have reviewed the patients History and Physical, chart, labs and discussed the procedure including the risks, benefits and alternatives for the proposed anesthesia with the patient or authorized representative who has indicated his/her understanding and acceptance.     Dental Advisory Given  Plan Discussed with: CRNA  Anesthesia Plan Comments:         Anesthesia Quick Evaluation

## 2020-10-04 NOTE — H&P (Signed)
Cephas Darby, MD [address]  [place]  [place], [place] [address]  Main: [phone]  Fax: [phone] Pager: [phone]  Primary Care Physician:  [name], MD Primary Gastroenterologist:  Dr. Cephas Darby  Pre-Procedure History & Physical: HPI:  Katrina Simon is a 70 y.o. female is here for an colonoscopy.   Past Medical History:  Diagnosis Date   Achalasia    Allergy    Arthritis    Depression    Emphysema of lung (HCC)    Fibromyalgia    GERD (gastroesophageal reflux disease)    Herpes genitalis    History of chicken pox    History of kidney stones     Past Surgical History:  Procedure Laterality Date   CESAREAN SECTION     COLONOSCOPY WITH PROPOFOL N/A 10/03/2020   Procedure: COLONOSCOPY WITH PROPOFOL;  Surgeon: Lin Landsman, MD;  Location: [facility] ENDOSCOPY;  Service: Gastroenterology;  Laterality: N/A;   ESOPHAGOGASTRODUODENOSCOPY (EGD) WITH PROPOFOL N/A 11/19/2016   Procedure: ESOPHAGOGASTRODUODENOSCOPY (EGD) WITH PROPOFOL;  Surgeon: Manya Silvas, MD;  Location: [facility] ENDOSCOPY;  Service: Endoscopy;  Laterality: N/A;   LAPAROSCOPY N/A    done at [facility] to release esophageal muscles    Prior to Admission medications   Medication Sig Start Date End Date Taking? Authorizing Provider  albuterol (PROVENTIL HFA;VENTOLIN HFA) 108 (90 Base) MCG/ACT inhaler Inhale into the lungs every 6 (six) hours as needed for wheezing or shortness of breath.   Yes [provider]  aspirin EC 81 MG tablet Take 81 mg by mouth daily. Swallow whole.   Yes [provider]  gabapentin (NEURONTIN) 300 MG capsule Take by mouth. 12/11/19  Yes [provider]  OLANZapine (ZYPREXA) 5 MG tablet Take 5 mg by mouth daily. 12/24/18  Yes [provider]  Oxycodone HCl 10 MG TABS PLEASE SEE ATTACHED FOR DETAILED DIRECTIONS 09/19/20  Yes [provider]  pantoprazole (PROTONIX) 40 MG tablet Take 40 mg by mouth daily. 08/01/20   Yes [provider]  vitamin B-12 (CYANOCOBALAMIN) 100 MCG tablet Take 100 mcg by mouth daily.   Yes [provider]  diclofenac Sodium (VOLTAREN) 1 % GEL Apply 4 g topically 4 (four) times daily. 12/14/19   Hyatt, Max T, DPM  FLUoxetine (PROZAC) 20 MG capsule Take by mouth. Patient not taking: Reported on 10/03/2020    [provider]  naloxone [facility]) nasal spray 4 mg/0.1 mL PLEASE SEE ATTACHED FOR DETAILED DIRECTIONS 08/21/20   [provider]    Allergies as of 10/03/2020 - Review Complete 10/03/2020  Allergen Reaction Noted   Penicillins Hives 07/18/2014   Aspirin Nausea And Vomiting 07/18/2014   Statins Other (See Comments) 07/18/2014    Family History  Problem Relation Age of Onset   Arthritis Mother    Heart disease Father    Stroke Father     Social History   Socioeconomic History   Marital status: Married    Spouse name: Not on file   Number of children: Not on file   Years of education: Not on file   Highest education level: Not on file  Occupational History   Not on file  Tobacco Use   Smoking status: Former    Packs/day: 0.50    Years: 45.00    Pack years: 22.50    Types: Cigarettes    Quit date: 09/16/2012    Years since quitting: 8.0   Smokeless tobacco: Never  Vaping Use  Vaping Use: Some days  Substance and Sexual Activity   Alcohol use: No    Alcohol/week: 0.0 standard drinks   Drug use: No   Sexual activity: Yes    Birth control/protection: Post-menopausal  Other Topics Concern   Not on file  Social History Narrative   Not on file   Social Determinants of Health   Financial Resource Strain: Not on file  Food Insecurity: Not on file  Transportation Needs: Not on file  Physical Activity: Not on file  Stress: Not on file  Social Connections: Not on file  Intimate Partner Violence: Not on file    Review of Systems: See HPI, otherwise negative ROS  Physical Exam: BP 128/65   Pulse 65   Temp 98.3 F  (36.8 C) (Temporal)   Ht 5\' 5"  (1.651 m)   Wt 50.3 kg   LMP  (LMP Unknown)   SpO2 96%   BMI 18.47 kg/m  General:   Alert,  pleasant and cooperative in NAD Head:  Normocephalic and atraumatic. Neck:  Supple; no masses or thyromegaly. Lungs:  Clear throughout to auscultation.    Heart:  Regular rate and rhythm. Abdomen:  Soft, nontender and nondistended. Normal bowel sounds, without guarding, and without rebound.   Neurologic:  Alert and  oriented x4;  grossly normal neurologically.  Impression/Plan: Katrina Simon is here for an colonoscopy to be performed for loose stools, weight loss  Risks, benefits, limitations, and alternatives regarding  colonoscopy have been reviewed with the patient.  Questions have been answered.  All parties agreeable.   Sherri Sear, MD  10/04/2020, 10:37 AM

## 2020-10-04 NOTE — Anesthesia Procedure Notes (Signed)
Date/Time: 10/04/2020 11:12 AM Performed by: Cameron Ali, CRNA Pre-anesthesia Checklist: Patient identified, Emergency Drugs available, Suction available, Timeout performed and Patient being monitored Patient Re-evaluated:Patient Re-evaluated prior to induction Oxygen Delivery Method: Nasal cannula Placement Confirmation: positive ETCO2

## 2020-10-05 ENCOUNTER — Encounter: Payer: Self-pay | Admitting: Gastroenterology

## 2020-10-05 LAB — SURGICAL PATHOLOGY

## 2020-10-08 ENCOUNTER — Telehealth: Payer: Self-pay

## 2020-10-08 MED ORDER — ZENPEP 40000-126000 UNITS PO CPEP
ORAL_CAPSULE | ORAL | 1 refills | Status: DC
Start: 2020-10-08 — End: 2021-07-09

## 2020-10-08 NOTE — Telephone Encounter (Signed)
-----   Message from Lin Landsman, MD sent at 10/05/2020 12:10 PM EDT ----- Please inform patient's husband that the pathology results from recent colonoscopy came back normal.  There is no evidence of inflammation.  Still waiting on the stool study results.  Have her try pancreatic enzymes Creon or Zenpep 2 capsules with each meal and 1 with snack, she can try samples first  Thanks Rohini Vanga

## 2020-10-08 NOTE — Telephone Encounter (Signed)
Patient verbalized understanding and sent medication to the pharmacy  

## 2020-10-09 ENCOUNTER — Telehealth: Payer: Self-pay

## 2020-10-09 ENCOUNTER — Other Ambulatory Visit: Payer: Self-pay

## 2020-10-09 NOTE — Telephone Encounter (Signed)
Patient verbalized understanding of instructions  

## 2020-10-09 NOTE — Telephone Encounter (Signed)
Patient husband is calling because she started taking the Zenpep yesterday at lunch time. He states that when he got home from work this morning she told him she had had 4-5 bowel movements during the night. Patient states the bowel movements are normal but she is having abdominal cramping. They turn in the stool sample on 10/03/2020

## 2020-10-11 LAB — PANCREATIC ELASTASE, FECAL

## 2020-10-19 LAB — VITAMIN K1, SERUM: VITAMIN K1: 0.25 ng/mL (ref 0.10–2.20)

## 2020-10-19 LAB — VITAMIN E
Vitamin E (Alpha Tocopherol): 9 mg/L (ref 9.0–29.0)
Vitamin E(Gamma Tocopherol): 2 mg/L (ref 0.5–4.9)

## 2020-10-19 LAB — VITAMIN A: Vitamin A: 30.4 ug/dL (ref 22.0–69.5)

## 2020-10-19 LAB — CELIAC DISEASE PANEL
Endomysial IgA: NEGATIVE
Immunoglobulin A, (IgA) QN, Serum: 437 mg/dL — ABNORMAL HIGH (ref 87–352)
t-Transglutaminase (tTG) IgA: 3 U/mL (ref 0–3)

## 2020-10-30 ENCOUNTER — Other Ambulatory Visit: Payer: Self-pay | Admitting: Family Medicine

## 2020-10-30 DIAGNOSIS — Z1231 Encounter for screening mammogram for malignant neoplasm of breast: Secondary | ICD-10-CM

## 2020-10-30 DIAGNOSIS — Z780 Asymptomatic menopausal state: Secondary | ICD-10-CM

## 2021-01-01 ENCOUNTER — Encounter: Payer: Self-pay | Admitting: Gastroenterology

## 2021-01-01 ENCOUNTER — Other Ambulatory Visit: Payer: Self-pay

## 2021-01-01 ENCOUNTER — Ambulatory Visit (INDEPENDENT_AMBULATORY_CARE_PROVIDER_SITE_OTHER): Payer: Medicare Other | Admitting: Gastroenterology

## 2021-01-01 VITALS — BP 144/87 | HR 86 | Temp 98.9°F | Ht 65.0 in | Wt 118.2 lb

## 2021-01-01 DIAGNOSIS — R1010 Upper abdominal pain, unspecified: Secondary | ICD-10-CM | POA: Diagnosis not present

## 2021-01-01 DIAGNOSIS — D509 Iron deficiency anemia, unspecified: Secondary | ICD-10-CM | POA: Diagnosis not present

## 2021-01-01 DIAGNOSIS — R634 Abnormal weight loss: Secondary | ICD-10-CM | POA: Diagnosis not present

## 2021-01-01 NOTE — Patient Instructions (Addendum)
CT SCAN ORDERED 01/02/2021 10:30 NEEDS TO BE THERE AT 10:15 AT OUTPATIENT IMAGING  [address]  NOTHING TO EAT OR DRINK AFTER MIDNIGHT GIVIN FUSION PLUS SAMPLES

## 2021-01-01 NOTE — Progress Notes (Signed)
Katrina Darby, MD [address]  [place]  [place], [place] [address]  Main: [phone]  Fax: [phone]    Gastroenterology Consultation  Referring Provider:     [name], MD Primary Care Physician:  [name], MD Primary Gastroenterologist:  Dr. Cephas Simon Reason for Consultation: Barrett's esophagus, epigastric pain, unintentional weight loss        HPI:   Katrina Simon is a 70 y.o. female referred by Dr. Iona Beard, Rubbie Battiest, MD  for consultation & management of unintentional weight loss, dysphagia.  Patient reports that she has been having difficulty swallowing and she underwent upper endoscopy in 04/2020 at [facility], reported to have Barrett's esophagus, biopsies were performed.  Pathology results are not available with me.  There was no evidence of stricture.  Patient does have history of iron deficiency anemia.  Her B12 and folate levels were normal.  Patient is accompanied by her husband today and he is concerned about recent history of significant weight loss.  She lost about 65 pounds within last several months.  He reports that her appetite has improved and she regained few pounds.  Patient denies any abdominal pain.  However, she does report intermittent loose stools.  She denies any rectal bleeding.  She does report abdominal bloating.  Patient does report spontaneous bruising.  Follow-up visit 01/01/2021 Patient underwent colonoscopy which was unremarkable including biopsies.  Patient's son who accompanied her today, reported that he turned in the stool specimen, apparently the test was canceled.  Patient reports that she has been eating more and gained 4 pounds.  She denies any dysphagia.  Patient did not tolerate Creon samples, felt worsening of abdominal pain.  She is no longer experiencing diarrhea, her bowel movements are up to 3 times a week, describes on Bristol stool scale as 3  NSAIDs: None  Antiplts/Anticoagulants/Anti thrombotics: None  GI  Procedures:  Upper endoscopy 05/09/2020    Upper endoscopy 11/19/2016 There were esophageal mucosal changes suspicious for short-segment Barrett's esophagus present in the distal esophagus. The maximum longitudinal extent of these mucosal changes was 2 cm in length. Mucosa was biopsied with a cold forceps for histology. One specimen bottle was sent to pathology. DIAGNOSIS:  A.  DISTAL ESOPHAGUS; COLD BIOPSY:  - GASTRIC TYPE MUCOSA WITH MINIMAL CHRONIC INFLAMMATION.  - NEGATIVE FOR SQUAMOUS MUCOSA, GOBLET CELLS, DYSPLASIA AND MALIGNANCY.  Colonoscopy 05/10/2013 Diagnosis:  Part A: ASCENDING COLON POLYP COLD BIOPSY:  - TUBULAR ADENOMA, 1 OF 3 FRAGMENTS.  - NEGATIVE FOR HIGH GRADE DYSPLASIA AND MALIGNANCY.  .  Part B: SIGMOID COLON POLYP HOT SNARE:  - HYPERPLASTIC POLYP.  - NEGATIVE FOR DYSPLASIA AND MALIGNANCY.  .  Part C: SIGMOID COLON POLYP COLD BIOPSY:  - HYPERPLASTIC POLYP.  - NEGATIVE FOR DYSPLASIA AND MALIGNANCY.  .  Part D: RECTOSIGMOID COLON POLYP COLD BIOPSY:  - HYPERPLASTIC POLYP, 2 FRAGMENTS.  - NEGATIVE FOR DYSPLASIA AND MALIGNANCY.   Past Medical History:  Diagnosis Date   Achalasia    Allergy    Arthritis    Depression    Emphysema of lung ([place])    Fibromyalgia    GERD (gastroesophageal reflux disease)    Herpes genitalis    History of chicken pox    History of kidney stones     Past Surgical History:  Procedure Laterality Date   CESAREAN SECTION     COLONOSCOPY WITH PROPOFOL N/A 10/03/2020   Procedure: COLONOSCOPY WITH PROPOFOL;  Surgeon: Lin Landsman,  MD;  Location: [facility] ENDOSCOPY;  Service: Gastroenterology;  Laterality: N/A;   COLONOSCOPY WITH PROPOFOL N/A 10/04/2020   Procedure: COLONOSCOPY WITH PROPOFOL;  Surgeon: Lin Landsman, MD;  Location: [place];  Service: Endoscopy;  Laterality: N/A;   ESOPHAGOGASTRODUODENOSCOPY (EGD) WITH PROPOFOL N/A 11/19/2016   Procedure: ESOPHAGOGASTRODUODENOSCOPY (EGD) WITH PROPOFOL;  Surgeon:  Manya Silvas, MD;  Location: [facility] ENDOSCOPY;  Service: Endoscopy;  Laterality: N/A;   LAPAROSCOPY N/A    done at [facility] to release esophageal muscles    Current Outpatient Medications:    albuterol (PROVENTIL HFA;VENTOLIN HFA) 108 (90 Base) MCG/ACT inhaler, Inhale into the lungs every 6 (six) hours as needed for wheezing or shortness of breath., Disp: , Rfl:    aspirin EC 81 MG tablet, Take 81 mg by mouth daily. Swallow whole., Disp: , Rfl:    diclofenac Sodium (VOLTAREN) 1 % GEL, Apply 4 g topically 4 (four) times daily., Disp: 100 g, Rfl: 3   gabapentin (NEURONTIN) 300 MG capsule, Take by mouth., Disp: , Rfl:    naloxone (NARCAN) nasal spray 4 mg/0.1 mL, PLEASE SEE ATTACHED FOR DETAILED DIRECTIONS, Disp: , Rfl:    OLANZapine (ZYPREXA) 5 MG tablet, Take 5 mg by mouth daily., Disp: , Rfl:    Oxycodone HCl 10 MG TABS, PLEASE SEE ATTACHED FOR DETAILED DIRECTIONS, Disp: , Rfl:    Pancrelipase, Lip-Prot-Amyl, (ZENPEP) 40000-126000 units CPEP, Take 2 capsules with the first bite of each meal and 1 capsule with the first bite of each snack, Disp: 240 capsule, Rfl: 1   pantoprazole (PROTONIX) 40 MG tablet, Take 40 mg by mouth daily., Disp: , Rfl:    vitamin B-12 (CYANOCOBALAMIN) 100 MCG tablet, Take 100 mcg by mouth daily., Disp: , Rfl:     Family History  Problem Relation Age of Onset   Arthritis Mother    Heart disease Father    Stroke Father      Social History   Tobacco Use   Smoking status: Former    Packs/day: 0.50    Years: 45.00    Pack years: 22.50    Types: Cigarettes    Quit date: 09/16/2012    Years since quitting: 8.2   Smokeless tobacco: Never  Vaping Use   Vaping Use: Some days  Substance Use Topics   Alcohol use: No    Alcohol/week: 0.0 standard drinks   Drug use: No    Allergies as of 01/01/2021 - Review Complete 01/01/2021  Allergen Reaction Noted   Penicillins Hives 07/18/2014   Aspirin Nausea And Vomiting 07/18/2014   Statins Other (See  Comments) 07/18/2014    Review of Systems:    All systems reviewed and negative except where noted in HPI.   Physical Exam:  BP (!) 144/87 (BP Location: Left Arm, Patient Position: Sitting, Cuff Size: Normal)   Pulse 86   Temp 98.9 F (37.2 C) (Oral)   Ht 5\' 5"  (1.651 m)   Wt 118 lb 3.2 oz (53.6 kg)   LMP  (LMP Unknown)   BMI 19.67 kg/m  No LMP recorded (lmp unknown). Patient is postmenopausal.  General:   Alert,  Well-developed, well-nourished, pleasant and cooperative in NAD Head:  Normocephalic and atraumatic. Eyes:  Sclera clear, no icterus.   Conjunctiva pink. Ears:  Normal auditory acuity. Nose:  No deformity, discharge, or lesions. Mouth:  No deformity or lesions,oropharynx pink & moist. Neck:  Supple; no masses or thyromegaly. Lungs:  Respirations even and unlabored.  Clear throughout to auscultation.  No wheezes, crackles, or rhonchi. No acute distress. Heart:  Regular rate and rhythm; no murmurs, clicks, rubs, or gallops. Abdomen:  Normal bowel sounds. Soft, non-tender and non-distended without masses, hepatosplenomegaly or hernias noted.  No guarding or rebound tenderness.   Rectal: Not performed Msk:  Symmetrical without gross deformities. Good, equal movement & strength bilaterally. Pulses:  Normal pulses noted. Extremities:  No clubbing or edema.  No cyanosis. Neurologic:  Alert and oriented x3;  grossly normal neurologically. Skin:  Intact without significant lesions or rashes. No jaundice. Psych:  Alert and cooperative. Normal mood and affect.  Imaging Studies: No abdominal imaging  Assessment and Plan:   ALEXANDRIA SHIFLETT is a 70 y.o. 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  Endoscopy  report is available with no evidence of structural lesions identified except for short segment Barrett's esophagus.   Pathology results revealed short segment Barrett's with no evidence of dysplasia Continue pantoprazole 40 mg daily long-term for chemoprevention Recommend upper endoscopy in 3 years for surveillance of Barrett's esophagus  Unexplained weight loss and intermittent loose stools: Weight has stabilized, slowly gaining weight, loose stools have resolved Patient has strong tobacco history, recommended pancreatic fecal elastase levels however the test was canceled given the patient dropped of the stool specimen Recommend CT abdomen and pelvis with contrast colonoscopy with TI evaluation and random colon biopsies were unremarkable Celiac disease panel and vitamin A, D, E and K levels were normal  Mild iron deficiency anemia EGD and colonoscopy are unremarkable Trial of fusion plus, samples provided Recheck in 6 months, if persistent, recommend video capsule endoscopy   Follow up in 6 months   Katrina Darby, MD

## 2021-01-02 ENCOUNTER — Ambulatory Visit: Admission: RE | Admit: 2021-01-02 | Payer: Medicare Other | Source: Ambulatory Visit

## 2021-01-02 ENCOUNTER — Telehealth: Payer: Self-pay

## 2021-01-02 NOTE — Telephone Encounter (Signed)
Had to reschedule ct scan because of insurance to 01/17/2021 at 9:30 arrival time of 9:15 no eating 4 hours before

## 2021-01-08 ENCOUNTER — Other Ambulatory Visit: Payer: Self-pay

## 2021-01-08 ENCOUNTER — Ambulatory Visit
Admission: RE | Admit: 2021-01-08 | Discharge: 2021-01-08 | Disposition: A | Discharge status: Home / Self Care | Payer: Medicare Other | Source: Ambulatory Visit | Attending: Family Medicine | Admitting: Family Medicine

## 2021-01-08 ENCOUNTER — Other Ambulatory Visit: Payer: Self-pay | Admitting: Family Medicine

## 2021-01-08 ENCOUNTER — Ambulatory Visit
Admission: RE | Admit: 2021-01-08 | Discharge: 2021-01-08 | Disposition: A | Discharge status: Home / Self Care | Payer: Medicare Other | Attending: Family Medicine | Admitting: Family Medicine

## 2021-01-08 DIAGNOSIS — R059 Cough, unspecified: Secondary | ICD-10-CM

## 2021-01-08 DIAGNOSIS — U099 Post covid-19 condition, unspecified: Secondary | ICD-10-CM | POA: Insufficient documentation

## 2021-01-17 ENCOUNTER — Ambulatory Visit: Admission: RE | Admit: 2021-01-17 | Payer: Medicare Other | Source: Ambulatory Visit

## 2021-01-30 ENCOUNTER — Other Ambulatory Visit: Payer: Self-pay | Admitting: Podiatry

## 2021-01-30 ENCOUNTER — Ambulatory Visit: Admission: RE | Admit: 2021-01-30 | Payer: Medicare Other | Source: Ambulatory Visit

## 2021-01-30 NOTE — Telephone Encounter (Signed)
Patient has not been seen since 2021,please schedule f/u  appt.for refill

## 2021-02-04 ENCOUNTER — Other Ambulatory Visit: Payer: Self-pay | Admitting: *Deleted

## 2021-02-04 DIAGNOSIS — F17210 Nicotine dependence, cigarettes, uncomplicated: Secondary | ICD-10-CM

## 2021-02-04 DIAGNOSIS — Z87891 Personal history of nicotine dependence: Secondary | ICD-10-CM

## 2021-02-19 ENCOUNTER — Ambulatory Visit
Admission: RE | Admit: 2021-02-19 | Discharge: 2021-02-19 | Disposition: A | Discharge status: Home / Self Care | Payer: Medicare Other | Source: Ambulatory Visit | Attending: Family Medicine | Admitting: Family Medicine

## 2021-02-19 ENCOUNTER — Other Ambulatory Visit: Payer: Self-pay

## 2021-02-19 DIAGNOSIS — Z780 Asymptomatic menopausal state: Secondary | ICD-10-CM | POA: Diagnosis present

## 2021-02-19 DIAGNOSIS — Z1231 Encounter for screening mammogram for malignant neoplasm of breast: Secondary | ICD-10-CM | POA: Diagnosis present

## 2021-02-20 ENCOUNTER — Other Ambulatory Visit: Payer: Self-pay

## 2021-02-20 NOTE — Patient Instructions (Signed)
Thank you for participating in the [facility] Screening [facility]. °It was our pleasure to meet you today. °We will call you with the results of your scan within the next few days. °Your scan will be assigned a Lung RADS category score by the physicians reading the scans.  °This Lung RADS score determines follow up scanning.  °See below for description of categories, and follow up screening recommendations. °We will be in touch to schedule your follow up screening annually or based on recommendations of our providers. °We will fax a copy of your scan results to your Primary Care Physician, or the physician who referred you to the program, to ensure they have the results. °Please call the office if you have any questions or concerns regarding your scanning experience or results.  °Our office number is [phone]. °Please speak with Denise Phelps, RN. She is our Lung Cancer Screening RN. °If she is unavailable when you call, please have the office staff send her a message. She will return your call at her earliest convenience. °Remember, if your scan is normal, we will scan you annually as long as you continue to meet the criteria for the program. (Age 55-77, Current smoker or smoker who has quit within the last 15 years). °If you are a smoker, remember, quitting is the single most powerful action that you can take to decrease your risk of lung cancer and other pulmonary, breathing related problems. °We know quitting is hard, and we are here to help.  °Please let us know if there is anything we can do to help you meet your goal of quitting. °If you are a former smoker, congratulations. We are proud of you! Remain smoke free! °Remember you can refer friends or family members through the number above.  °We will screen them to make sure they meet criteria for the program. °Thank you for helping us take better care of you by participating in Lung Screening. ° °You can receive free nicotine replacement therapy  ( patches, gum or mints) by calling 1-800-QUIT NOW. Please call so we can get you on the path to becoming  a non-smoker. I know it is hard, but you can do this! ° °Lung RADS Categories: ° °Lung RADS 1: no nodules or definitely non-concerning nodules.  °Recommendation is for a repeat annual scan in 12 months. ° °Lung RADS 2:  nodules that are non-concerning in appearance and behavior with a very low likelihood of becoming an active cancer. °Recommendation is for a repeat annual scan in 12 months. ° °Lung RADS 3: nodules that are probably non-concerning , includes nodules with a low likelihood of becoming an active cancer.  Recommendation is for a 6-month repeat screening scan. Often noted after an upper respiratory illness. We will be in touch to make sure you have no questions, and to schedule your 6-month scan. ° °Lung RADS 4 A: nodules with concerning findings, recommendation is most often for a follow up scan in 3 months or additional testing based on our provider's assessment of the scan. We will be in touch to make sure you have no questions and to schedule the recommended 3 month follow up scan. ° °Lung RADS 4 B:  indicates findings that are concerning. We will be in touch with you to schedule additional diagnostic testing based on our provider's  assessment of the scan. ° °Hypnosis for smoking cessation  °[employer] °[phone] ° °Acupuncture for smoking cessation  °[facility] °[phone]  °

## 2021-02-20 NOTE — Progress Notes (Signed)
Shared Decision Making Visit Lung Cancer Screening Program [phone])   Eligibility: Age 70 y.o. Pack Years Smoking History Calculation 51 (# packs/per year x # years smoked) Recent History of coughing up blood  no Unexplained weight loss? No weight loss in the last 6 months. She was 103lbs in May 2022 and is currently 113lbs. 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 Smoking Status Current Smoker Former Smokers: Years since quit: NA  Quit Date: NA  Visit Components: Discussion included one or more decision making aids. yes Discussion included risk/benefits of screening. yes Discussion included potential follow up diagnostic testing for abnormal scans. yes Discussion included meaning and risk of over diagnosis. yes Discussion included meaning and risk of False Positives. yes Discussion included meaning of total radiation exposure. yes  Counseling Included: Importance of adherence to annual lung cancer LDCT screening. yes Impact of comorbidities on ability to participate in the program. yes Ability and willingness to under diagnostic treatment. yes  Smoking Cessation Counseling: Current Smokers:  Discussed importance of smoking cessation. yes Information about tobacco cessation classes and interventions provided to patient. yes Patient provided with "ticket" for LDCT Scan. yes Symptomatic Patient. no  Counseling(Intermediate counseling: > three minutes) [address]406 Diagnosis Code: Tobacco Use Z72.0 Asymptomatic Patient yes  Counseling (Intermediate counseling: > three minutes counseling) D3570 Former Smokers:  Discussed the importance of maintaining cigarette abstinence. yes Diagnosis Code: Personal History of Nicotine Dependence. V77.939 Information about  tobacco cessation classes and interventions provided to patient. Yes Patient provided with "ticket" for LDCT Scan. yes Written Order for Lung Cancer Screening with LDCT placed in Epic. Yes (CT Chest Lung Cancer Screening Low Dose W/O CM) [id number] Z12.2-Screening of respiratory organs Z87.891-Personal history of nicotine dependence  I have spent 25 minutes of face to face/ virtual visit   time with Ms Jacobowitz discussing the risks and benefits of lung cancer screening. We viewed / discussed a power point together that explained in detail the above noted topics. We paused at intervals to allow for questions to be asked and answered to ensure understanding.We discussed that the single most powerful action that she can take to decrease her risk of developing lung cancer is to quit smoking. We discussed whether or not she is ready to commit to setting a quit date. We discussed options for tools to aid in quitting smoking including nicotine replacement therapy, non-nicotine medications, support groups, Quit Smart classes, and behavior modification. We discussed that often times setting smaller, more achievable goals, such as eliminating 1 cigarette a day for a week and then 2 cigarettes a day for a week can be helpful in slowly decreasing the number of cigarettes smoked. This allows for a sense of accomplishment as well as providing a clinical benefit. I provided her with smoking cessation  information  with contact information for community resources, classes, free nicotine replacement therapy, and access to mobile apps, text messaging, and on-line smoking cessation help. I have also provided her the office contact information in the event she needs to contact me, or the screening staff. We discussed the time and location of the scan, and that either Doroteo Glassman RN, Joella Prince, RN  or I will call / send a letter with the results within 24-72 hours of receiving them. The patient  verbalized understanding of all  of  the above and had no further questions upon leaving the office. They have my contact information in the event they have any further questions.  I spent 3-5 minutes counseling on smoking cessation and the health risks of continued tobacco abuse.  I explained to the patient that there has been a high incidence of coronary artery disease noted on these exams. I explained that this is a non-gated exam therefore degree or severity cannot be determined. This patient is not on statin therapy. I have asked the patient to follow-up with their PCP regarding any incidental finding of coronary artery disease and management with diet or medication as their PCP  feels is clinically indicated. The patient verbalized understanding of the above and had no further questions upon completion of the visit.  No weight loss in the last 6 months. She was 103lbs in May 2022 and is currently 113lbs. 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 She was also taking her medication incorrectly, her partner has taken over her medication management. Denies hemoptysis.    Martyn Ehrich, NP

## 2021-02-21 ENCOUNTER — Encounter: Payer: Self-pay | Admitting: Primary Care

## 2021-02-21 ENCOUNTER — Ambulatory Visit (INDEPENDENT_AMBULATORY_CARE_PROVIDER_SITE_OTHER): Payer: Medicare Other | Admitting: Primary Care

## 2021-02-21 ENCOUNTER — Other Ambulatory Visit: Payer: Self-pay

## 2021-02-21 VITALS — BP 144/72 | HR 97 | Temp 98.2°F | Ht 65.0 in | Wt 113.0 lb

## 2021-02-21 DIAGNOSIS — F17210 Nicotine dependence, cigarettes, uncomplicated: Secondary | ICD-10-CM | POA: Diagnosis not present

## 2021-02-21 DIAGNOSIS — F17200 Nicotine dependence, unspecified, uncomplicated: Secondary | ICD-10-CM

## 2021-02-25 ENCOUNTER — Telehealth: Payer: Self-pay

## 2021-02-25 ENCOUNTER — Ambulatory Visit
Admission: RE | Admit: 2021-02-25 | Discharge: 2021-02-25 | Disposition: A | Discharge status: Home / Self Care | Payer: Medicare Other | Source: Ambulatory Visit | Attending: Gastroenterology | Admitting: Gastroenterology

## 2021-02-25 ENCOUNTER — Other Ambulatory Visit: Payer: Self-pay

## 2021-02-25 DIAGNOSIS — F17210 Nicotine dependence, cigarettes, uncomplicated: Secondary | ICD-10-CM | POA: Diagnosis present

## 2021-02-25 DIAGNOSIS — R1010 Upper abdominal pain, unspecified: Secondary | ICD-10-CM | POA: Diagnosis present

## 2021-02-25 DIAGNOSIS — Z87891 Personal history of nicotine dependence: Secondary | ICD-10-CM | POA: Diagnosis present

## 2021-02-25 DIAGNOSIS — R634 Abnormal weight loss: Secondary | ICD-10-CM | POA: Insufficient documentation

## 2021-02-25 LAB — POCT I-STAT CREATININE: Creatinine, Ser: 1.4 mg/dL — ABNORMAL HIGH (ref 0.44–1.00)

## 2021-02-25 MED ORDER — IOHEXOL 300 MG/ML  SOLN
100.0000 mL | Freq: Once | INTRAMUSCULAR | Status: AC | PRN
  Administered 2021-02-25: 80 mL via INTRAVENOUS

## 2021-02-25 NOTE — Telephone Encounter (Signed)
CALLED PATIENT NO ANSWER LEFT VOICEMAIL FOR A CALL BACK ? ?

## 2021-02-25 NOTE — Telephone Encounter (Signed)
-----   Message from Lin Landsman, MD sent at 02/25/2021  4:42 PM EST ----- Please inform patient that the CT of her abdomen did not reveal any concerning pathology to explain weight loss.  I will see her for follow-up as scheduled in 4/23  RV

## 2021-02-26 ENCOUNTER — Telehealth: Payer: Self-pay

## 2021-02-26 DIAGNOSIS — E041 Nontoxic single thyroid nodule: Secondary | ICD-10-CM | POA: Insufficient documentation

## 2021-02-26 DIAGNOSIS — J849 Interstitial pulmonary disease, unspecified: Secondary | ICD-10-CM | POA: Insufficient documentation

## 2021-02-26 NOTE — Telephone Encounter (Signed)
Called patient to give her the results she understands and will follow up as planned

## 2021-02-26 NOTE — Telephone Encounter (Signed)
-----   Message from Lin Landsman, MD sent at 02/25/2021  4:42 PM EST ----- Please inform patient that the CT of her abdomen did not reveal any concerning pathology to explain weight loss.  I will see her for follow-up as scheduled in 4/23  RV

## 2021-03-13 ENCOUNTER — Other Ambulatory Visit: Payer: Self-pay | Admitting: Acute Care

## 2021-03-13 DIAGNOSIS — Z87891 Personal history of nicotine dependence: Secondary | ICD-10-CM

## 2021-03-13 DIAGNOSIS — F17210 Nicotine dependence, cigarettes, uncomplicated: Secondary | ICD-10-CM

## 2021-03-26 DIAGNOSIS — F331 Major depressive disorder, recurrent, moderate: Secondary | ICD-10-CM | POA: Insufficient documentation

## 2021-03-26 DIAGNOSIS — R4189 Other symptoms and signs involving cognitive functions and awareness: Secondary | ICD-10-CM | POA: Insufficient documentation

## 2021-03-26 DIAGNOSIS — I700 Atherosclerosis of aorta: Secondary | ICD-10-CM | POA: Insufficient documentation

## 2021-04-03 ENCOUNTER — Telehealth: Payer: Self-pay | Admitting: Acute Care

## 2021-04-03 DIAGNOSIS — Z87891 Personal history of nicotine dependence: Secondary | ICD-10-CM

## 2021-04-03 DIAGNOSIS — F17210 Nicotine dependence, cigarettes, uncomplicated: Secondary | ICD-10-CM

## 2021-04-03 NOTE — Telephone Encounter (Signed)
Lung screening CT results were discussed with pt by PCP. Pt was referred to Dr Francesca Oman for pulmonary consult. We will repeat lung screening CT in 1 year.

## 2021-06-18 ENCOUNTER — Ambulatory Visit: Payer: Medicare PPO | Admitting: Anesthesiology

## 2021-06-18 ENCOUNTER — Encounter: Payer: Self-pay | Admitting: Anesthesiology

## 2021-06-18 VITALS — BP 132/79 | HR 107 | Temp 97.1°F | Resp 18 | Ht 64.0 in | Wt 111.0 lb

## 2021-06-18 DIAGNOSIS — J189 Pneumonia, unspecified organism: Secondary | ICD-10-CM | POA: Diagnosis not present

## 2021-06-18 DIAGNOSIS — M5136 Other intervertebral disc degeneration, lumbar region: Secondary | ICD-10-CM | POA: Insufficient documentation

## 2021-06-18 DIAGNOSIS — M797 Fibromyalgia: Secondary | ICD-10-CM | POA: Insufficient documentation

## 2021-06-18 DIAGNOSIS — G894 Chronic pain syndrome: Secondary | ICD-10-CM | POA: Insufficient documentation

## 2021-06-18 DIAGNOSIS — F1190 Opioid use, unspecified, uncomplicated: Secondary | ICD-10-CM | POA: Insufficient documentation

## 2021-06-18 DIAGNOSIS — M159 Polyosteoarthritis, unspecified: Secondary | ICD-10-CM | POA: Insufficient documentation

## 2021-06-18 DIAGNOSIS — M47816 Spondylosis without myelopathy or radiculopathy, lumbar region: Secondary | ICD-10-CM | POA: Insufficient documentation

## 2021-06-18 DIAGNOSIS — M5135 Other intervertebral disc degeneration, thoracolumbar region: Secondary | ICD-10-CM | POA: Insufficient documentation

## 2021-06-18 DIAGNOSIS — M170 Bilateral primary osteoarthritis of knee: Secondary | ICD-10-CM | POA: Insufficient documentation

## 2021-06-18 DIAGNOSIS — A419 Sepsis, unspecified organism: Secondary | ICD-10-CM | POA: Diagnosis not present

## 2021-06-18 DIAGNOSIS — J449 Chronic obstructive pulmonary disease, unspecified: Secondary | ICD-10-CM | POA: Insufficient documentation

## 2021-06-18 MED ORDER — CYCLOBENZAPRINE HCL 10 MG PO TABS: 10.0000 mg | ORAL_TABLET | Freq: Every day | ORAL | 3 refills | Status: DC

## 2021-06-18 NOTE — Progress Notes (Signed)
? ?Subjective:  ?Patient ID: Katrina Simon, female    DOB: [birth year]  Age: 71 y.o. MRN: [id number] ? ?CC: Back Pain and Hip Pain (bilateral) ? ? ?  ?PROCEDURE: None ? ?HPI ?Katrina Simon presents for a new patient evaluation.  She is a pleasant 71 year old white female with a longstanding history of chronic neck thoracic and lumbar pain.  She has previously been seen in [facility] by Dr. Belenda Cruise crisp.  She has been a long-term clinic patient here with him in the distant past as well.  She mentions a distant traumatic event with falling from a horse that created some of these issues and currently describes a pain in the cervical thoracic and lumbar region that is described as a stable but persistent pain rated at a pain score of 8 on average rarely better that does not appear to be influenced by time of day.  Pain is worse with bending climbing kneeling lifting standing and walking uphill.  Medication management has been effective and in the past she has been on oxycodone 10 or 15 mg effectively with good relief rated at 75 to 80% generally lasting 4 to 6 hours and no side effects reported.  This was discontinued in [place] when she changed insurance over to [facility] which was not excepted by Dr. Primus Bravo.  She denies any illicit or diverting history.  The pain has been described as distressing pressure-like uncomfortable and incapacitating.  She had previous bone scan MRIs nerve blocks and success with epidural steroid injections generally getting 50% for month to 2 months at a time especially when the pain has been recalcitrant and increasingly aggravating.  She is also had trigger point injections and facet blocks. ? ?History ?Katrina Simon has a past medical history of Achalasia, Allergy, Arthritis, Depression, Emphysema of lung ([place]), Fibromyalgia, Gastroesophageal reflux disease with esophagitis (01/26/2020), GERD (gastroesophageal reflux disease), Herpes genitalis, History of chicken pox, and History of kidney stones.   ? ?She has a past surgical history that includes Cesarean section; laparoscopy (N/A); Esophagogastroduodenoscopy (egd) with propofol (N/A, 11/19/2016); Colonoscopy with propofol (N/A, 10/03/2020); and Colonoscopy with propofol (N/A, 10/04/2020).  ? ?Her family history includes Arthritis in her mother; Heart disease in her father; Stroke in her father.She reports that she quit smoking about 8 years ago. Her smoking use included cigarettes. She has a 22.50 pack-year smoking history. She has never used smokeless tobacco. She reports that she does not drink alcohol and does not use drugs. ? ?No valid procedures specified. ? ?No results found for: TOXASSSELUR ? ?Outpatient Medications Prior to Visit  ?Medication Sig Dispense Refill  ? albuterol (PROVENTIL HFA;VENTOLIN HFA) 108 (90 Base) MCG/ACT inhaler Inhale into the lungs every 6 (six) hours as needed for wheezing or shortness of breath.    ? aspirin EC 81 MG tablet Take 81 mg by mouth daily. Swallow whole.    ? Budeson-Glycopyrrol-Formoterol (BREZTRI AEROSPHERE) 160-9-4.8 MCG/ACT AERO Inhale into the lungs.    ? diclofenac Sodium (VOLTAREN) 1 % GEL Apply 4 g topically 4 (four) times daily. 100 g 3  ? FLUoxetine (PROZAC) 20 MG capsule Take 60 mg by mouth daily.    ? gabapentin (NEURONTIN) 300 MG capsule TAKE 2 CAPSULES BY MOUTH AT BEDTIME (Patient taking differently: Take 600 mg by mouth at bedtime.) 60 capsule 3  ? naloxone (NARCAN) nasal spray 4 mg/0.1 mL PLEASE SEE ATTACHED FOR DETAILED DIRECTIONS    ? OLANZapine (ZYPREXA) 5 MG tablet Take 5 mg by mouth daily.    ?  oxybutynin (DITROPAN XL) 10 MG 24 hr tablet Take 10 mg by mouth at bedtime.    ? pantoprazole (PROTONIX) 40 MG tablet Take 40 mg by mouth daily.    ? sulfamethoxazole-trimethoprim (BACTRIM) 400-80 MG tablet Take 1 tablet by mouth 3 (three) times a week.    ? vitamin B-12 (CYANOCOBALAMIN) 100 MCG tablet Take 100 mcg by mouth daily.    ? diclofenac Sodium (VOLTAREN) 1 % GEL Apply 1 application. topically  in the morning, at noon, in the evening, and at bedtime. (Patient not taking: Reported on 06/18/2021)    ? Oxycodone HCl 10 MG TABS PLEASE SEE ATTACHED FOR DETAILED DIRECTIONS (Patient not taking: Reported on 06/18/2021)    ? Pancrelipase, Lip-Prot-Amyl, (ZENPEP) 40000-126000 units CPEP Take 2 capsules with the first bite of each meal and 1 capsule with the first bite of each snack (Patient not taking: Reported on 02/21/2021) 240 capsule 1  ? ?No facility-administered medications prior to visit.  ? ?Lab Results  ?Component Value Date  ? WBC 5.8 07/26/2020  ? HGB 9.4 (L) 07/26/2020  ? HCT 29.4 (L) 07/26/2020  ? PLT 209 07/26/2020  ? GLUCOSE 91 07/26/2020  ? ALT 13 07/26/2020  ? AST 18 07/26/2020  ? NA 141 07/26/2020  ? K 4.1 07/26/2020  ? CL 104 07/26/2020  ? CREATININE 1.40 (H) 02/25/2021  ? BUN 12 07/26/2020  ? CO2 27 07/26/2020  ? TSH 0.304 (L) 07/26/2020  ? INR 1.1 07/26/2020  ? ? ?--------------------------------------------------------------------------------------------------------------------- ?CT ABDOMEN PELVIS W CONTRAST ? ?Result Date: 02/25/2021 ?CLINICAL DATA:  Weight loss and abdominal pain EXAM: CT ABDOMEN AND PELVIS WITH CONTRAST TECHNIQUE: Multidetector CT imaging of the abdomen and pelvis was performed using the standard protocol following bolus administration of intravenous contrast. CONTRAST:  82m OMNIPAQUE IOHEXOL 300 MG/ML SOLN, additional oral enteric contrast COMPARISON:  None. FINDINGS: Lower chest: No acute abnormality.  Small hiatal hernia. Hepatobiliary: No solid liver abnormality is seen. No gallstones. Mild intra and extrahepatic biliary ductal dilatation, tapering smoothly to the ampulla without obstructing etiology identified, central common bile duct measuring up to 0.9 cm (series 4, image 54). Pancreas: Unremarkable. No pancreatic ductal dilatation or surrounding inflammatory changes. Spleen: Normal in size without significant abnormality. Adrenals/Urinary Tract: Adrenal glands are  unremarkable. Kidneys are normal, without renal calculi, solid lesion, or hydronephrosis. Bladder is unremarkable. Stomach/Bowel: Stomach is within normal limits. Appendix appears normal. No evidence of bowel wall thickening, distention, or inflammatory changes. Large burden of stool throughout the colon and rectum. Vascular/Lymphatic: Aortic atherosclerosis. No enlarged abdominal or pelvic lymph nodes. Reproductive: No mass or other significant abnormality. Other: No abdominal wall hernia or abnormality. No abdominopelvic ascites. Musculoskeletal: No acute or significant osseous findings. IMPRESSION: 1. No specific CT findings to explain weight loss or abdominal pain. 2. Mild intra and extrahepatic biliary ductal dilatation, tapering smoothly to the ampulla without obstructing etiology identified, central common bile duct measuring up to 0.9 cm. Correlate with biochemical evidence of cholestasis. 3. Large burden of stool throughout the colon and rectum. 4. Small hiatal hernia. Aortic Atherosclerosis (ICD10-I70.0). Electronically Signed   By: ADelanna AhmadiM.D.   On: 02/25/2021 13:58  ? ?CT CHEST LUNG CA SCREEN LOW DOSE W/O CM ? ?Result Date: 02/25/2021 ?CLINICAL DATA:  71year old asymptomatic female current smoker with 51 pack-year smoking history. EXAM: CT CHEST WITHOUT CONTRAST LOW-DOSE FOR LUNG CANCER SCREENING TECHNIQUE: Multidetector CT imaging of the chest was performed following the standard protocol without IV contrast. COMPARISON:  08/27/2012 chest CT. FINDINGS: Cardiovascular:  Normal heart size. No significant pericardial effusion/thickening. Right coronary atherosclerosis. Atherosclerotic nonaneurysmal thoracic aorta. Normal caliber pulmonary arteries. Mediastinum/Nodes: Hypodense 1.4 cm right thyroid nodule, stable. Not clinically significant; no follow-up imaging recommended (ref: J Am Coll Radiol. 2015 Feb;12(2): 143-50). Oral contrast layering in the mid to lower thoracic esophagus. No  pathologically enlarged axillary, mediastinal or hilar lymph nodes, noting limited sensitivity for the detection of hilar adenopathy on this noncontrast study. Lungs/Pleura: No pneumothorax. No pleural effusion. Mild c

## 2021-06-20 ENCOUNTER — Other Ambulatory Visit: Payer: Self-pay

## 2021-06-20 ENCOUNTER — Emergency Department: Payer: Medicare PPO

## 2021-06-20 ENCOUNTER — Inpatient Hospital Stay
Admission: EM | Admit: 2021-06-20 | Discharge: 2021-06-24 | DRG: 871 | Disposition: A | Discharge status: Home Health | Payer: Medicare PPO | Attending: Internal Medicine | Admitting: Internal Medicine

## 2021-06-20 ENCOUNTER — Encounter: Payer: Self-pay | Admitting: Emergency Medicine

## 2021-06-20 DIAGNOSIS — Z880 Allergy status to penicillin: Secondary | ICD-10-CM | POA: Diagnosis not present

## 2021-06-20 DIAGNOSIS — J9602 Acute respiratory failure with hypercapnia: Principal | ICD-10-CM | POA: Diagnosis present

## 2021-06-20 DIAGNOSIS — F418 Other specified anxiety disorders: Secondary | ICD-10-CM | POA: Diagnosis not present

## 2021-06-20 DIAGNOSIS — F419 Anxiety disorder, unspecified: Secondary | ICD-10-CM | POA: Diagnosis present

## 2021-06-20 DIAGNOSIS — M5136 Other intervertebral disc degeneration, lumbar region: Secondary | ICD-10-CM | POA: Diagnosis present

## 2021-06-20 DIAGNOSIS — Z8261 Family history of arthritis: Secondary | ICD-10-CM

## 2021-06-20 DIAGNOSIS — J189 Pneumonia, unspecified organism: Secondary | ICD-10-CM | POA: Diagnosis present

## 2021-06-20 DIAGNOSIS — Z20822 Contact with and (suspected) exposure to covid-19: Secondary | ICD-10-CM | POA: Diagnosis present

## 2021-06-20 DIAGNOSIS — Z681 Body mass index (BMI) 19 or less, adult: Secondary | ICD-10-CM | POA: Diagnosis not present

## 2021-06-20 DIAGNOSIS — J9601 Acute respiratory failure with hypoxia: Secondary | ICD-10-CM | POA: Diagnosis present

## 2021-06-20 DIAGNOSIS — Z823 Family history of stroke: Secondary | ICD-10-CM | POA: Diagnosis not present

## 2021-06-20 DIAGNOSIS — M170 Bilateral primary osteoarthritis of knee: Secondary | ICD-10-CM | POA: Diagnosis present

## 2021-06-20 DIAGNOSIS — M25552 Pain in left hip: Secondary | ICD-10-CM | POA: Diagnosis present

## 2021-06-20 DIAGNOSIS — Z888 Allergy status to other drugs, medicaments and biological substances status: Secondary | ICD-10-CM | POA: Diagnosis not present

## 2021-06-20 DIAGNOSIS — M5135 Other intervertebral disc degeneration, thoracolumbar region: Secondary | ICD-10-CM | POA: Diagnosis present

## 2021-06-20 DIAGNOSIS — K220 Achalasia of cardia: Secondary | ICD-10-CM | POA: Diagnosis present

## 2021-06-20 DIAGNOSIS — N179 Acute kidney failure, unspecified: Secondary | ICD-10-CM

## 2021-06-20 DIAGNOSIS — L98499 Non-pressure chronic ulcer of skin of other sites with unspecified severity: Secondary | ICD-10-CM

## 2021-06-20 DIAGNOSIS — M797 Fibromyalgia: Secondary | ICD-10-CM | POA: Diagnosis present

## 2021-06-20 DIAGNOSIS — W540XXA Bitten by dog, initial encounter: Secondary | ICD-10-CM | POA: Diagnosis present

## 2021-06-20 DIAGNOSIS — F32A Depression, unspecified: Secondary | ICD-10-CM | POA: Diagnosis present

## 2021-06-20 DIAGNOSIS — G894 Chronic pain syndrome: Secondary | ICD-10-CM | POA: Diagnosis present

## 2021-06-20 DIAGNOSIS — J441 Chronic obstructive pulmonary disease with (acute) exacerbation: Secondary | ICD-10-CM | POA: Diagnosis present

## 2021-06-20 DIAGNOSIS — E43 Unspecified severe protein-calorie malnutrition: Secondary | ICD-10-CM | POA: Diagnosis present

## 2021-06-20 DIAGNOSIS — Z7982 Long term (current) use of aspirin: Secondary | ICD-10-CM

## 2021-06-20 DIAGNOSIS — K219 Gastro-esophageal reflux disease without esophagitis: Secondary | ICD-10-CM | POA: Diagnosis present

## 2021-06-20 DIAGNOSIS — W540XXD Bitten by dog, subsequent encounter: Secondary | ICD-10-CM | POA: Diagnosis not present

## 2021-06-20 DIAGNOSIS — Z79891 Long term (current) use of opiate analgesic: Secondary | ICD-10-CM

## 2021-06-20 DIAGNOSIS — A419 Sepsis, unspecified organism: Secondary | ICD-10-CM | POA: Diagnosis present

## 2021-06-20 DIAGNOSIS — Z87891 Personal history of nicotine dependence: Secondary | ICD-10-CM

## 2021-06-20 DIAGNOSIS — Z8249 Family history of ischemic heart disease and other diseases of the circulatory system: Secondary | ICD-10-CM

## 2021-06-20 DIAGNOSIS — J439 Emphysema, unspecified: Secondary | ICD-10-CM | POA: Diagnosis present

## 2021-06-20 DIAGNOSIS — M25551 Pain in right hip: Secondary | ICD-10-CM | POA: Diagnosis present

## 2021-06-20 DIAGNOSIS — N1831 Chronic kidney disease, stage 3a: Secondary | ICD-10-CM | POA: Diagnosis present

## 2021-06-20 DIAGNOSIS — Z79899 Other long term (current) drug therapy: Secondary | ICD-10-CM

## 2021-06-20 DIAGNOSIS — M47816 Spondylosis without myelopathy or radiculopathy, lumbar region: Secondary | ICD-10-CM | POA: Diagnosis present

## 2021-06-20 LAB — CBC WITH DIFFERENTIAL/PLATELET
Abs Immature Granulocytes: 0.07 10*3/uL (ref 0.00–0.07)
Basophils Absolute: 0.2 10*3/uL — ABNORMAL HIGH (ref 0.0–0.1)
Basophils Relative: 1 %
Eosinophils Absolute: 4.2 10*3/uL — ABNORMAL HIGH (ref 0.0–0.5)
Eosinophils Relative: 25 %
HCT: 37.3 % (ref 36.0–46.0)
Hemoglobin: 12.1 g/dL (ref 12.0–15.0)
Immature Granulocytes: 0 %
Lymphocytes Relative: 19 %
Lymphs Abs: 3.2 10*3/uL (ref 0.7–4.0)
MCH: 29.3 pg (ref 26.0–34.0)
MCHC: 32.4 g/dL (ref 30.0–36.0)
MCV: 90.3 fL (ref 80.0–100.0)
Monocytes Absolute: 1.1 10*3/uL — ABNORMAL HIGH (ref 0.1–1.0)
Monocytes Relative: 7 %
Neutro Abs: 7.9 10*3/uL — ABNORMAL HIGH (ref 1.7–7.7)
Neutrophils Relative %: 48 %
Platelets: 299 10*3/uL (ref 150–400)
RBC: 4.13 MIL/uL (ref 3.87–5.11)
RDW: 14.6 % (ref 11.5–15.5)
Smear Review: NORMAL
WBC: 16.6 10*3/uL — ABNORMAL HIGH (ref 4.0–10.5)
nRBC: 0 % (ref 0.0–0.2)

## 2021-06-20 LAB — BLOOD GAS, VENOUS
Acid-base deficit: 5.5 mmol/L — ABNORMAL HIGH (ref 0.0–2.0)
Bicarbonate: 22.6 mmol/L (ref 20.0–28.0)
O2 Saturation: 93.4 %
Patient temperature: 37
pCO2, Ven: 54 mmHg (ref 44–60)
pH, Ven: 7.23 — ABNORMAL LOW (ref 7.25–7.43)
pO2, Ven: 71 mmHg — ABNORMAL HIGH (ref 32–45)

## 2021-06-20 LAB — COMPREHENSIVE METABOLIC PANEL
ALT: 16 U/L (ref 0–44)
AST: 25 U/L (ref 15–41)
Albumin: 4.2 g/dL (ref 3.5–5.0)
Alkaline Phosphatase: 135 U/L — ABNORMAL HIGH (ref 38–126)
Anion gap: 10 (ref 5–15)
BUN: 16 mg/dL (ref 8–23)
CO2: 22 mmol/L (ref 22–32)
Calcium: 9.3 mg/dL (ref 8.9–10.3)
Chloride: 105 mmol/L (ref 98–111)
Creatinine, Ser: 1.19 mg/dL — ABNORMAL HIGH (ref 0.44–1.00)
GFR, Estimated: 49 mL/min — ABNORMAL LOW (ref >60)
Glucose, Bld: 165 mg/dL — ABNORMAL HIGH (ref 70–99)
Potassium: 4 mmol/L (ref 3.5–5.1)
Sodium: 137 mmol/L (ref 135–145)
Total Bilirubin: 0.5 mg/dL (ref 0.3–1.2)
Total Protein: 8.5 g/dL — ABNORMAL HIGH (ref 6.5–8.1)

## 2021-06-20 LAB — LACTIC ACID, PLASMA
Lactic Acid, Venous: 1.3 mmol/L (ref 0.5–1.9)
Lactic Acid, Venous: 1.4 mmol/L (ref 0.5–1.9)

## 2021-06-20 LAB — RESP PANEL BY RT-PCR (FLU A&B, COVID) ARPGX2
Influenza A by PCR: NEGATIVE
Influenza B by PCR: NEGATIVE
SARS Coronavirus 2 by RT PCR: NEGATIVE

## 2021-06-20 LAB — PATHOLOGIST SMEAR REVIEW

## 2021-06-20 LAB — TROPONIN I (HIGH SENSITIVITY)
Troponin I (High Sensitivity): 16 ng/L (ref <18)
Troponin I (High Sensitivity): 13 ng/L (ref <18)

## 2021-06-20 LAB — PROCALCITONIN: Procalcitonin: <0.1 ng/mL

## 2021-06-20 MED ORDER — PANTOPRAZOLE SODIUM 40 MG PO TBEC
40.0000 mg | DELAYED_RELEASE_TABLET | Freq: Every day | ORAL | Status: DC
  Administered 2021-06-20 – 2021-06-24 (×5): 40 mg via ORAL
  Filled 2021-06-20 (×5): qty 1

## 2021-06-20 MED ORDER — CYCLOBENZAPRINE HCL 10 MG PO TABS
10.0000 mg | ORAL_TABLET | Freq: Every day | ORAL | Status: DC
  Administered 2021-06-20 – 2021-06-23 (×4): 10 mg via ORAL
  Filled 2021-06-20 (×4): qty 1

## 2021-06-20 MED ORDER — IPRATROPIUM-ALBUTEROL 0.5-2.5 (3) MG/3ML IN SOLN
RESPIRATORY_TRACT | Status: AC
  Administered 2021-06-20: 9 mL via RESPIRATORY_TRACT
  Filled 2021-06-20: qty 9

## 2021-06-20 MED ORDER — VITAMIN B-12 100 MCG PO TABS
100.0000 ug | ORAL_TABLET | Freq: Every day | ORAL | Status: DC
  Administered 2021-06-20 – 2021-06-24 (×5): 100 ug via ORAL
  Filled 2021-06-20 (×6): qty 1

## 2021-06-20 MED ORDER — ACETAMINOPHEN 325 MG PO TABS
650.0000 mg | ORAL_TABLET | Freq: Four times a day (QID) | ORAL | Status: DC | PRN
  Administered 2021-06-21 – 2021-06-23 (×2): 650 mg via ORAL
  Filled 2021-06-20 (×2): qty 2

## 2021-06-20 MED ORDER — CEFTRIAXONE SODIUM 1 G IJ SOLR
1.0000 g | Freq: Once | INTRAMUSCULAR | Status: AC
  Administered 2021-06-20: 1 g via INTRAVENOUS
  Filled 2021-06-20: qty 10

## 2021-06-20 MED ORDER — LIDOCAINE 5 % EX PTCH
1.0000 | MEDICATED_PATCH | Freq: Every day | CUTANEOUS | Status: DC
  Administered 2021-06-20 – 2021-06-22 (×3): 1 via TRANSDERMAL
  Filled 2021-06-20 (×5): qty 1

## 2021-06-20 MED ORDER — ASCORBIC ACID 500 MG PO TABS
500.0000 mg | ORAL_TABLET | Freq: Two times a day (BID) | ORAL | Status: DC
Start: 2021-06-20 — End: 2021-06-24
  Administered 2021-06-20 – 2021-06-24 (×9): 500 mg via ORAL
  Filled 2021-06-20 (×9): qty 1

## 2021-06-20 MED ORDER — ADULT MULTIVITAMIN W/MINERALS CH
1.0000 | ORAL_TABLET | Freq: Every day | ORAL | Status: DC
  Administered 2021-06-20 – 2021-06-24 (×5): 1 via ORAL
  Filled 2021-06-20 (×5): qty 1

## 2021-06-20 MED ORDER — IPRATROPIUM-ALBUTEROL 0.5-2.5 (3) MG/3ML IN SOLN
3.0000 mL | RESPIRATORY_TRACT | Status: DC
  Administered 2021-06-20 – 2021-06-23 (×19): 3 mL via RESPIRATORY_TRACT
  Filled 2021-06-20 (×19): qty 3

## 2021-06-20 MED ORDER — FLUOXETINE HCL 20 MG PO CAPS
40.0000 mg | ORAL_CAPSULE | Freq: Every day | ORAL | Status: DC
  Administered 2021-06-20 – 2021-06-24 (×5): 40 mg via ORAL
  Filled 2021-06-20 (×5): qty 2

## 2021-06-20 MED ORDER — LACTATED RINGERS IV BOLUS: 1000.0000 mL | Freq: Once | INTRAVENOUS | Status: DC

## 2021-06-20 MED ORDER — ONDANSETRON HCL 4 MG/2ML IJ SOLN: 4.0000 mg | Freq: Three times a day (TID) | INTRAMUSCULAR | Status: DC | PRN

## 2021-06-20 MED ORDER — SODIUM CHLORIDE 0.9 % IV SOLN
500.0000 mg | INTRAVENOUS | Status: DC
  Administered 2021-06-21 – 2021-06-23 (×3): 500 mg via INTRAVENOUS
  Filled 2021-06-20 (×3): qty 500

## 2021-06-20 MED ORDER — LACTATED RINGERS IV BOLUS (SEPSIS)
250.0000 mL | Freq: Once | INTRAVENOUS | Status: AC
  Administered 2021-06-20: 250 mL via INTRAVENOUS

## 2021-06-20 MED ORDER — ASPIRIN EC 81 MG PO TBEC
81.0000 mg | DELAYED_RELEASE_TABLET | Freq: Every day | ORAL | Status: DC
  Administered 2021-06-20 – 2021-06-24 (×5): 81 mg via ORAL
  Filled 2021-06-20 (×5): qty 1

## 2021-06-20 MED ORDER — LACTATED RINGERS IV BOLUS (SEPSIS)
500.0000 mL | Freq: Once | INTRAVENOUS | Status: AC
  Administered 2021-06-20: 500 mL via INTRAVENOUS

## 2021-06-20 MED ORDER — AZITHROMYCIN 500 MG IV SOLR
500.0000 mg | Freq: Once | INTRAVENOUS | Status: AC
  Administered 2021-06-20: 500 mg via INTRAVENOUS
  Filled 2021-06-20: qty 5

## 2021-06-20 MED ORDER — AZITHROMYCIN 500 MG PO TABS
500.0000 mg | ORAL_TABLET | Freq: Once | ORAL | Status: DC
Start: 2021-06-20 — End: 2021-06-20

## 2021-06-20 MED ORDER — OXYBUTYNIN CHLORIDE ER 10 MG PO TB24
10.0000 mg | ORAL_TABLET | Freq: Every day | ORAL | Status: DC
  Administered 2021-06-20 – 2021-06-23 (×4): 10 mg via ORAL
  Filled 2021-06-20 (×4): qty 1

## 2021-06-20 MED ORDER — LACTATED RINGERS IV BOLUS (SEPSIS)
1000.0000 mL | Freq: Once | INTRAVENOUS | Status: AC
  Administered 2021-06-20: 1000 mL via INTRAVENOUS

## 2021-06-20 MED ORDER — METHYLPREDNISOLONE SODIUM SUCC 40 MG IJ SOLR
40.0000 mg | Freq: Two times a day (BID) | INTRAMUSCULAR | Status: DC
  Administered 2021-06-20 – 2021-06-24 (×9): 40 mg via INTRAVENOUS
  Filled 2021-06-20 (×10): qty 1

## 2021-06-20 MED ORDER — GABAPENTIN 300 MG PO CAPS
600.0000 mg | ORAL_CAPSULE | Freq: Every day | ORAL | Status: DC
  Administered 2021-06-20 – 2021-06-23 (×4): 600 mg via ORAL
  Filled 2021-06-20 (×4): qty 2

## 2021-06-20 MED ORDER — DM-GUAIFENESIN ER 30-600 MG PO TB12
1.0000 | ORAL_TABLET | Freq: Two times a day (BID) | ORAL | Status: DC | PRN
Start: 2021-06-20 — End: 2021-06-24
  Administered 2021-06-20 – 2021-06-23 (×3): 1 via ORAL
  Filled 2021-06-20 (×3): qty 1

## 2021-06-20 MED ORDER — OXYCODONE HCL 5 MG PO TABS
5.0000 mg | ORAL_TABLET | Freq: Four times a day (QID) | ORAL | Status: DC | PRN
  Administered 2021-06-20 – 2021-06-24 (×9): 5 mg via ORAL
  Filled 2021-06-20 (×9): qty 1

## 2021-06-20 MED ORDER — IPRATROPIUM-ALBUTEROL 0.5-2.5 (3) MG/3ML IN SOLN
9.0000 mL | Freq: Once | RESPIRATORY_TRACT | Status: AC
Start: 2021-06-20 — End: 2021-06-20

## 2021-06-20 MED ORDER — LACTATED RINGERS IV SOLN: INTRAVENOUS | Status: AC

## 2021-06-20 MED ORDER — ENSURE ENLIVE PO LIQD
237.0000 mL | Freq: Two times a day (BID) | ORAL | Status: DC
  Administered 2021-06-20: 237 mL via ORAL

## 2021-06-20 MED ORDER — ZINC SULFATE 220 (50 ZN) MG PO CAPS
220.0000 mg | ORAL_CAPSULE | Freq: Every day | ORAL | Status: DC
  Administered 2021-06-20 – 2021-06-24 (×5): 220 mg via ORAL
  Filled 2021-06-20 (×5): qty 1

## 2021-06-20 MED ORDER — OLANZAPINE 5 MG PO TABS
5.0000 mg | ORAL_TABLET | Freq: Every day | ORAL | Status: DC
  Administered 2021-06-20 – 2021-06-24 (×5): 5 mg via ORAL
  Filled 2021-06-20 (×5): qty 1

## 2021-06-20 MED ORDER — ALBUTEROL SULFATE (2.5 MG/3ML) 0.083% IN NEBU
2.5000 mg | INHALATION_SOLUTION | RESPIRATORY_TRACT | Status: DC | PRN
  Administered 2021-06-22: 2.5 mg via RESPIRATORY_TRACT
  Filled 2021-06-20: qty 3

## 2021-06-20 MED ORDER — VITAMIN D 25 MCG (1000 UNIT) PO TABS
1000.0000 [IU] | ORAL_TABLET | Freq: Every day | ORAL | Status: DC
  Administered 2021-06-20 – 2021-06-24 (×4): 1000 [IU] via ORAL
  Filled 2021-06-20 (×4): qty 1

## 2021-06-20 MED ORDER — SODIUM CHLORIDE 0.9 % IV SOLN
2.0000 g | INTRAVENOUS | Status: DC
  Administered 2021-06-21 – 2021-06-24 (×4): 2 g via INTRAVENOUS
  Filled 2021-06-20 (×3): qty 2
  Filled 2021-06-20: qty 20

## 2021-06-20 MED ORDER — ENOXAPARIN SODIUM 40 MG/0.4ML IJ SOSY
40.0000 mg | PREFILLED_SYRINGE | INTRAMUSCULAR | Status: DC
  Administered 2021-06-20 – 2021-06-24 (×5): 40 mg via SUBCUTANEOUS
  Filled 2021-06-20 (×5): qty 0.4

## 2021-06-20 MED ORDER — SODIUM CHLORIDE 0.9 % IV SOLN
1.0000 g | Freq: Once | INTRAVENOUS | Status: AC
  Administered 2021-06-20: 1 g via INTRAVENOUS
  Filled 2021-06-20: qty 10

## 2021-06-20 NOTE — ED Notes (Signed)
Pt wheezing this nurse advised to do breathing treatment before transport pt refused and said she did not want it till she was in her new room. ?

## 2021-06-20 NOTE — ED Notes (Signed)
RT called about placing pt on BIPAP. ?

## 2021-06-20 NOTE — Progress Notes (Signed)
Pt taken off bipap and placed on 3L [place], tolerating well no shortness of breath noted, sats 97%, respiratory rate 18/min, will continue to monitor.  ?

## 2021-06-20 NOTE — ED Triage Notes (Addendum)
Pt to room 5 via w/c, audible wheezing/rhales; diff speaking in complete sentences, accomp by husband who st pt has hx COPD; received 2 nebs tonight without relief of SHOB, also c/o midsternal CP; st occas prod cough clear sputum; assisted into bed, on card monitor: ra sat initially 60's; placed on O2 at 3l/min to bring sat to 95% ?

## 2021-06-20 NOTE — TOC Initial Note (Signed)
Transition of Care (TOC) - Initial/Assessment Note  ? ? ?Patient Details  ?Name: Katrina Simon ?MRN: [id number] ?Date of Birth: [birth year] ? ?Transition of Care (TOC) CM/SW Contact:    ?Shelbie Hutching, RN ?Phone Number: ?06/20/2021, 12:14 PM ? ?Clinical Narrative:                 ?Patient admitted to the hospital with acute respiratory failure with hypoxia and hypercapnia, history of COPD.  Patient on acute O2 at 2L.   ?RNCM met with patient and patient's husband at the beside, introduced self and explained role in DC planning. ?Patient is from home with her husband and his son and his son's daughter.  Patient is pretty independent at home her husband helps with her medications and provides transportation.  Patient does not use any assisted devices.  She is current with her PCP [facility] in [place].  She uses [facility] for prescriptions.  Patient would like to have home health services at discharge.  Tommi Rumps with [facility] accepted referral for RN, PT and OT.   ? ?TOC will cont to follow.   ? ?Expected Discharge Plan: [place] ?Barriers to Discharge: Continued Medical Work up ? ? ?Patient Goals and CMS Choice ?Patient states their goals for this hospitalization and ongoing recovery are:: Breath better and get back home ?CMS Medicare.gov Compare Post Acute Care list provided to:: Patient ?Choice offered to / list presented to : Patient ? ?Expected Discharge Plan and Services ?Expected Discharge Plan: [place] ?  ?Discharge Planning Services: CM Consult ?Post Acute Care Choice: Home Health ?Living arrangements for the past 2 months: [place] ?                ?DME Arranged: N/A ?DME Agency: NA ?  ?  ?  ?HH Arranged: RN, PT, OT ?[place] Agency: [place] ?Date HH Agency Contacted: 06/20/21 ?Time [place]: 9924 ?Representative spoke with at [place]: Tommi Rumps ? ?Prior Living Arrangements/Services ?Living arrangements for the past 2 months: [place] ?Lives with:: Spouse, Adult Children ?Patient language and need for interpreter reviewed:: Yes ?Do you feel safe going back to the place where you live?: Yes      ?Need for Family Participation in Patient Care: Yes (Comment) ?Care giver support system in place?: Yes (comment) (husband) ?  ?Criminal Activity/Legal Involvement Pertinent to Current Situation/Hospitalization: No - Comment as needed ? ?Activities of Daily Living ?Home Assistive Devices/Equipment: None ?ADL Screening (condition at time of admission) ?Patient's cognitive ability adequate to safely complete daily activities?: Yes ?Is the patient deaf or have difficulty hearing?: No ?Does the patient have difficulty seeing, even when wearing glasses/contacts?: No ?Does the patient have difficulty concentrating, remembering, or making decisions?: No ?Patient able to express need for assistance with ADLs?: Yes ?Does the patient have difficulty dressing or bathing?: No ?Independently performs ADLs?: Yes (appropriate for developmental age) ?Does the patient have difficulty walking or climbing stairs?: No ?Weakness of Legs: None ?Weakness of Arms/Hands: None ? ?Permission Sought/Granted ?Permission sought to share information with : Case Manager, Family Supports, Other (comment) ?Permission granted to share information with : Yes, Verbal Permission Granted ? Share Information with NAME: Baylyn Sickles ? Permission granted to share info w AGENCY: home health ? Permission granted to share info w Relationship: husband ? Permission granted to share info w Contact Information: [phone] ? ?Emotional Assessment ?Appearance:: Appears older than stated age ?Attitude/Demeanor/Rapport: Engaged ?  Affect (typically observed): Accepting ?Orientation: : Oriented to Self, Oriented to Place, Oriented to  Time, Oriented to Situation ?Alcohol / Substance Use: Not Applicable ?Psych Involvement: No (comment) ? ?Admission diagnosis:  Acute respiratory failure with hypoxia and  hypercapnia (HCC) [J96.01, J96.02] ?CAP (community acquired pneumonia) [J18.9] ?Patient Active Problem List  ? Diagnosis Date Noted  ? Acute respiratory failure with hypoxia and hypercapnia ([place]) 06/20/2021  ? Chronic kidney disease, stage 3a ([place]) 06/20/2021  ? Depression with anxiety 06/20/2021  ? COPD exacerbation ([place]) 06/20/2021  ? CAP (community acquired pneumonia) 06/20/2021  ? Dog bite_right leg 06/20/2021  ? Chronic, continuous use of opioids 06/18/2021  ? Loose stools   ? Unintentional weight loss   ? Falls 07/26/2020  ? Protein-calorie malnutrition, severe 07/26/2020  ? Emphysema of lung ([place])   ? Depression   ? Chronic anemia   ? Stage 3 chronic kidney disease ([place]) 10/22/2019  ? Herpes genitalis 10/02/2016  ? Chronic pain syndrome 12/06/2014  ? Personal history of urinary calculi 12/06/2014  ? Esophageal reflux 12/06/2014  ? Facet syndrome, lumbar 09/21/2014  ? DJD of shoulder 08/23/2014  ? DDD (degenerative disc disease), lumbar 07/18/2014  ? DDD (degenerative disc disease), thoracolumbar 07/18/2014  ? DJD (degenerative joint disease) of knee 07/18/2014  ? DJD (degenerative joint disease) shoulder, sacroiliac joint  07/18/2014  ? CAFL (chronic airflow limitation) ([place]) 08/24/2013  ? Clinical depression 08/24/2013  ? Fibromyalgia 08/24/2013  ? Arthritis, degenerative 08/18/2013  ? Adenomatous colon polyp 05/10/2013  ? Anxiety disorder 12/29/2006  ? Benign paroxysmal positional nystagmus 12/01/2005  ? Generalized osteoarthrosis, involving multiple sites 03/17/1997  ? ?PCP:  [name], MD ?Pharmacy:   ?[facility], [place] [address] ?[address] ?[place] [address] ?Phone: [phone] Fax: [phone] ? ?[id number] [place] [address]?Phone: [phone]: [phone]? ? ? ? ?Social Determinants of Health (SDOH) Interventions ?  ? ?Readmission Risk Interventions ?   ? View : No data to  display.  ?  ?  ?  ? ? ? ?

## 2021-06-20 NOTE — ED Notes (Signed)
Breakfast tray delivered to patient at this time.  ?

## 2021-06-20 NOTE — Consult Note (Signed)
WOC Nurse Consult Note: ?Reason for Consult: unrealted to ED chief complaint, patient presents with full thickness open wound right lateral thigh. Dog bite sustained 06/01/21 at which time the patient was seen and treated at [facility]. She has also had follow up for this wound with her primary care MD (Dr. Matilde Bash).  ?Wound type: trauma ?Pressure Injury POA: NA ?Measurement: see nursing flow sheets ?Wound bed:100% pink, pale, areas of hypergranulation tissue, 4 staples remain at the distal aspect of the wound bed. Not sure why they are still in place.  Primary care MD to decide on removal  ?Drainage (amount, consistency, odor) none noted in images  ?Periwound:intact, no s/s of infection ?Dressing procedure/placement/frequency: ?Hydrogel (amorphous saline gel) to the wound bed daily, top with dry dressing.  ? ?Follow up with primary care, noted primary care to refer to plastic surgery. ? ?Discussed POC with patient and bedside nurse.  ?Re consult if needed, will not follow at this time. ?Thanks ? Takina Busser [facility] MSN, RN,CWOCN, CNS, CWON-AP [phone])  ? ? ? ?  ?

## 2021-06-20 NOTE — Progress Notes (Signed)
Sepsis tracking by eLINK 

## 2021-06-20 NOTE — ED Notes (Signed)
Pt now on Bi-pap, RT remains at bedside  ?

## 2021-06-20 NOTE — Progress Notes (Signed)
Initial Nutrition Assessment ? ?DOCUMENTATION CODES:  ? ?Not applicable ? ?INTERVENTION:  ? ?-MVI with minerals daily ?-500 gm vitamin C BID ?-220 mg zinc sulfate daily x 14 days ?-Continue Ensure Enlive po BID, each supplement provides 350 kcal and 20 grams of protein.  ? ?NUTRITION DIAGNOSIS:  ? ?Increased nutrient needs related to chronic illness (COPD) as evidenced by estimated needs. ? ?GOAL:  ? ?Patient will meet greater than or equal to 90% of their needs ? ?MONITOR:  ? ?PO intake, Supplement acceptance, Diet advancement, Labs, Weight trends, Skin, I & O's ? ?REASON FOR ASSESSMENT:  ? ?Consult ?Assessment of nutrition requirement/status ? ?ASSESSMENT:  ? ?Katrina Simon is a 71 y.o. female with a history of COPD, GERD, fibromyalgia who presents for evaluation of shortness of breath.  Patient has had progressively worsening shortness of breath for the last 2 days.  Initially her inhalers were helping at home but this evening they stopped working.  She has a cough productive of white phlegm.  She is complaining of chills and subjective fevers.  She is complaining of chest tightness.  She is not on oxygen at home.  She denies history of PE or DVT, recent travel immobilization, leg pain or swelling, hemoptysis, exogenous hormones.  She also denies vomiting or diarrhea. ? ?Pt admitted with sepsis, respiratory failure, and CAP.  ? ?Reviewed I/O's: +1.8 L x 24 hours  ? ?Pt unavailable at time of visit. Attempted to speak with pt via call to hospital room phone, however, unable to reach. RD unable to obtain further nutrition-related history or complete nutrition-focused physical exam at this time.   ? ?Per CWOCN notes, pt with full thickness wound to rt lateral thigh.  ? ?Reviewed wt hx; wt has been stable over the past 4 months.  ? ?Pt remains on a regular diet. No meal completion data available to assess at this time. Pt would benefit from addition of oral nutrition supplements.  ? ?Medications reviewed and include  solu-medrol.  ? ?Labs reviewed: Phos: 4.7.  ? ?Diet Order:   ?Diet Order   ? ?       ?  Diet regular Room service appropriate? Yes; Fluid consistency: Thin  Diet effective now       ?  ? ?  ?  ? ?  ? ? ?EDUCATION NEEDS:  ? ?No education needs have been identified at this time ? ?Skin:  Skin Assessment: Skin Integrity Issues: ?Skin Integrity Issues:: Other (Comment) ?Other: full thickness wound to rt lateral thigh ? ?Last BM:  Unknown ? ?Height:  ? ?Ht Readings from Last 1 Encounters:  ?06/20/21 '5\' 5"'$  (1.651 m)  ? ? ?Weight:  ? ?Wt Readings from Last 1 Encounters:  ?06/20/21 51.7 kg  ? ? ?Ideal Body Weight:  56.8 kg ? ?BMI:  Body mass index is 18.97 kg/m?. ? ?Estimated Nutritional Needs:  ? ?Kcal:  1550-1750 ? ?Protein:  75-90 grams ? ?Fluid:  > 1.5 L ? ? ? ?Loistine Chance, RD, LDN, CDCES ?Registered Dietitian II ?Certified Diabetes Care and Education Specialist ?Please refer to [facility] for RD and/or RD on-call/weekend/after hours pager  ?

## 2021-06-20 NOTE — Progress Notes (Signed)
CODE SEPSIS - PHARMACY COMMUNICATION ? ?**Broad Spectrum Antibiotics should be administered within 1 hour of Sepsis diagnosis** ? ?Time Code Sepsis Called/Page Received: 2297  ? ?Antibiotics Ordered: Azithromycin & Ceftriaxone ? ?Time of 1st antibiotic administration: 0611 ? ?Renda Rolls, PharmD, MBA ?06/20/2021 ?5:52 AM ? ?

## 2021-06-20 NOTE — ED Provider Notes (Signed)
? ?[facility] ?Provider Note ? ? ? Event Date/Time  ? First MD Initiated Contact with Patient 06/20/21 [phone]   ?  (approximate) ? ? ?History  ? ?Shortness of Breath ? ? ?HPI ? ?Katrina Simon is a 71 y.o. female with a history of COPD, GERD, fibromyalgia who presents for evaluation of shortness of breath.  Patient has had progressively worsening shortness of breath for the last 2 days.  Initially her inhalers were helping at home but this evening they stopped working.  She has a cough productive of white phlegm.  She is complaining of chills and subjective fevers.  She is complaining of chest tightness.  She is not on oxygen at home.  She denies history of PE or DVT, recent travel immobilization, leg pain or swelling, hemoptysis, exogenous hormones.  She also denies vomiting or diarrhea. ?  ? ? ?Past Medical History:  ?Diagnosis Date  ? Achalasia   ? Allergy   ? Arthritis   ? Depression   ? Emphysema of lung ([place])   ? Fibromyalgia   ? Gastroesophageal reflux disease with esophagitis 01/26/2020  ? Formatting of this note might be different from the original. LA Grade C noted on EGD 01/2020  ? GERD (gastroesophageal reflux disease)   ? Herpes genitalis   ? History of chicken pox   ? History of kidney stones   ? ? ?Past Surgical History:  ?Procedure Laterality Date  ? CESAREAN SECTION    ? COLONOSCOPY WITH PROPOFOL N/A 10/03/2020  ? Procedure: COLONOSCOPY WITH PROPOFOL;  Surgeon: Lin Landsman, MD;  Location: [facility] ENDOSCOPY;  Service: Gastroenterology;  Laterality: N/A;  ? COLONOSCOPY WITH PROPOFOL N/A 10/04/2020  ? Procedure: COLONOSCOPY WITH PROPOFOL;  Surgeon: Lin Landsman, MD;  Location: [place];  Service: Endoscopy;  Laterality: N/A;  ? ESOPHAGOGASTRODUODENOSCOPY (EGD) WITH PROPOFOL N/A 11/19/2016  ? Procedure: ESOPHAGOGASTRODUODENOSCOPY (EGD) WITH PROPOFOL;  Surgeon: Manya Silvas, MD;  Location: [facility] ENDOSCOPY;  Service: Endoscopy;  Laterality: N/A;  ?  LAPAROSCOPY N/A   ? done at [facility] to release esophageal muscles  ? ? ? ?Physical Exam  ? ?Triage Vital Signs: ?ED Triage Vitals  ?Enc Vitals Group  ?   BP 06/20/21 0429 (!) 191/71  ?   Pulse Rate 06/20/21 0435 (!) 104  ?   Resp 06/20/21 0429 (!) 32  ?   Temp 06/20/21 0429 98.7 ?F (37.1 ?C)  ?   Temp Source 06/20/21 0429 Oral  ?   SpO2 06/20/21 0429 (!) 60 %  ?   Weight 06/20/21 0429 114 lb (51.7 kg)  ?   Height 06/20/21 0429 '5\' 5"'$  (1.651 m)  ?   Head Circumference --   ?   Peak Flow --   ?   Pain Score 06/20/21 0429 8  ?   Pain Loc --   ?   Pain Edu? --   ?   Excl. in [place]? --   ? ? ?Most recent vital signs: ?Vitals:  ? 06/20/21 0500 06/20/21 0530  ?BP: (!) 135/91 (!) 147/127  ?Pulse: 91 98  ?Resp: 17 17  ?Temp:    ?SpO2: 99% 99%  ? ? ? ?Constitutional: Alert and oriented, cachectic, moderate respiratory distress. ?HEENT: ?     Head: Normocephalic and atraumatic.    ?     Eyes: Conjunctivae are normal. Sclera is non-icteric.  ?     Mouth/Throat: Mucous membranes are moist.  ?     Neck: Supple  with no signs of meningismus. ?Cardiovascular: Regular rate and rhythm. No murmurs, gallops, or rubs. 2+ symmetrical distal pulses are present in all extremities.  ?Respiratory: Moderate respiratory distress, diffuse wheezing bilaterally, patient's initial sats after walking from triage to the room was 60% on room air, sats improved to the upper 90s on 3 L nasal cannula, tachypneic with respiratory rate in the upper 20s ?Gastrointestinal: Soft, non tender, and non distended with positive bowel sounds. No rebound or guarding. ?Genitourinary: No CVA tenderness. ?Musculoskeletal:  No edema, cyanosis, or erythema of extremities. ?Neurologic: Normal speech and language. Face is symmetric. Moving all extremities. No gross focal neurologic deficits are appreciated. ?Skin: Skin is warm, dry and intact. No rash noted. ?Psychiatric: Mood and affect are normal. Speech and behavior are normal. ? ?ED Results / Procedures /  Treatments  ? ?Labs ?(all labs ordered are listed, but only abnormal results are displayed) ?Labs Reviewed  ?CBC WITH DIFFERENTIAL/PLATELET - Abnormal; Notable for the following components:  ?    Result Value  ? WBC 16.6 (*)   ? Neutro Abs 7.9 (*)   ? Monocytes Absolute 1.1 (*)   ? Eosinophils Absolute 4.2 (*)   ? Basophils Absolute 0.2 (*)   ? All other components within normal limits  ?COMPREHENSIVE METABOLIC PANEL - Abnormal; Notable for the following components:  ? Glucose, Bld 165 (*)   ? Creatinine, Ser 1.19 (*)   ? Total Protein 8.5 (*)   ? Alkaline Phosphatase 135 (*)   ? GFR, Estimated 49 (*)   ? All other components within normal limits  ?BLOOD GAS, VENOUS - Abnormal; Notable for the following components:  ? pH, Ven 7.23 (*)   ? pO2, Ven 71 (*)   ? Acid-base deficit 5.5 (*)   ? All other components within normal limits  ?RESP PANEL BY RT-PCR (FLU A&B, COVID) [id number]  ?CULTURE, BLOOD (ROUTINE X 2)  ?CULTURE, BLOOD (ROUTINE X 2)  ?LACTIC ACID, PLASMA  ?LACTIC ACID, PLASMA  ?PATHOLOGIST SMEAR REVIEW  ?TROPONIN I (HIGH SENSITIVITY)  ? ? ? ?EKG ? ?ED ECG REPORT ?I, Rudene Re, the attending physician, personally viewed and interpreted this ECG. ? ?Sinus tachycardia with a rate of 104, right axis deviation, no ST elevations or depressions ? ?RADIOLOGY ?I, Rudene Re, attending MD, have personally viewed and interpreted the images obtained during this visit as below: ? ?Chest x-ray showing bilateral infiltrate ? ? ?___________________________________________________ ?Interpretation by Radiologist:  ?DG Chest Portable 1 View ? ?Result Date: 06/20/2021 ?CLINICAL DATA:  Shortness of breath. EXAM: PORTABLE CHEST 1 VIEW COMPARISON:  Chest CT 02/25/2021, chest PA Lat 01/08/2021 FINDINGS: The cardiac size is normal. There is aortic atherosclerosis with stable mediastinum. No pleural effusion is seen. The lungs emphysematous with subpleural reticulation in the lower lung fields. There is increased opacity in  the peripheral right mid lung and in the right lower lung field concerning for multilobar pneumonia. The remaining lungs clear. There is osteopenia mild thoracic spondylosis. IMPRESSION: Emphysematous and chronic changes, with superimposed opacities in the peripheral right mid lung above the horizontal fissure and in the right lower lung field, most likely due to multilobar pneumonia. Follow-up study recommended to ensure clearing. Electronically Signed   By: Telford Nab M.D.   On: 06/20/2021 05:29   ? ? ? ?PROCEDURES: ? ?Critical Care performed: Yes, see critical care procedure note(s) ? ?.Critical Care ?Performed by: Rudene Re, MD ?Authorized by: Rudene Re, MD  ? ?Critical care provider statement:  ?  Critical care  time (minutes):  40 ?  Critical care time was exclusive of:  Separately billable procedures and treating other patients ?  Critical care was necessary to treat or prevent imminent or life-threatening deterioration of the following conditions:  Respiratory failure, circulatory failure and cardiac failure ?  Critical care was time spent personally by me on the following activities:  Development of treatment plan with patient or surrogate, discussions with consultants, evaluation of patient's response to treatment, examination of patient, ordering and review of laboratory studies, ordering and review of radiographic studies, ordering and performing treatments and interventions, pulse oximetry, re-evaluation of patient's condition and review of old charts ?  I assumed direction of critical care for this patient from another provider in my specialty: no   ?  Care discussed with: admitting provider   ? ? ? ?IMPRESSION / MDM / ASSESSMENT AND PLAN / ED COURSE  ?I reviewed the triage vital signs and the nursing notes. ? ?71 y.o. female with a history of COPD, GERD, fibromyalgia who presents for evaluation of shortness of breath and productive cough x 2 days.  Patient arrives in moderate  respiratory distress, tachypneic, tripoding, satting 60% on room air with diffuse wheezing bilaterally.  Placed on 3 L nasal cannula with sats that improved to the mid 90s.  Respiratory therapist was called stat to the room

## 2021-06-20 NOTE — H&P (Signed)
?History and Physical  ? ? Katrina Simon TIR:[id number] DOB: [birth year] DOA: 06/20/2021 ? ?Referring MD/NP/PA:  ? ?PCP: Katrina Peach, MD  ? ?Patient coming from:  The patient is coming from home.  At baseline, pt is independent for most of ADL.       ? ?Chief Complaint: SOB ? ?HPI: Katrina Simon is a 71 y.o. female with medical history significant of COPD, former smoker, GERD, depression with anxiety, fibromyalgia, chronic pain syndrome, kidney stone, CKD-3A, who presents with shortness breath. ? ?Patient states that she has shortness breath for more than 2 days, which has been progressively worsening.  Patient has wheezing and cough with little sputum production.  She also complains of sharp chest pain which is located in the front chest, mild to moderate, nonradiating, pleuritic, aggravated by coughing.  Patient has nausea, no vomiting, diarrhea or abdominal pain.  No symptoms of UTI.  Of note, patient had dog bite with an open wound to her right leg, 2.5 weeks ago.  Husband states that she will be seen at [facility] for possible skin grafting tomorrow. ? ?Patient was found to have severe respiratory distress, cannot speak in full sentence, oxygen desaturating to 60s on room air (patient is not using oxygen normally), BiPAP started in ED. ? ?Data Reviewed and ED Course: pt was found to have WBC 16.6, lactic acid 1.3 --> 1.4, troponin level 16 -->13, negative COVID PCR, stable renal function, temperature normal, blood pressure 191/71, 131/79, heart rate 104, 99, RR 32, 14, chest x-ray showed COPD and infiltration in right middle lobe and right lower lobe.  VBG with pH 7.23, CO2 54, O2 71. Patient is admitted to PCU as inpatient ? ?EKG: I have personally reviewed.  Sinus rhythm, QTc 479, right axis deviation, left atrial enlargement, nonspecific T wave change ? ? ?Review of Systems:  ? ?General: no fevers, chills, no body weight gain, has fatigue ?HEENT: no blurry vision, hearing changes or sore  throat ?Respiratory: has dyspnea, coughing, wheezing ?CV: has chest pain, no palpitations ?GI: has nausea, no vomiting, abdominal pain, diarrhea, constipation ?GU: no dysuria, burning on urination, increased urinary frequency, hematuria  ?Ext: no leg edema ?Neuro: no unilateral weakness, numbness, or tingling, no vision change or hearing loss ?Skin: no rash. Has dog bite wound in right lateral leg ?MSK: No muscle spasm, no deformity, no limitation of range of movement in spin ?Heme: No easy bruising.  ?Travel history: No recent long distant travel. ? ? ?Allergy:  ?Allergies  ?Allergen Reactions  ? Penicillins Hives  ? Aspirin Nausea And Vomiting  ? Statins Other (See Comments)  ?  Myalgia  ? ? ?Past Medical History:  ?Diagnosis Date  ? Achalasia   ? Allergy   ? Arthritis   ? Depression   ? Emphysema of lung ([place])   ? Fibromyalgia   ? Gastroesophageal reflux disease with esophagitis 01/26/2020  ? Formatting of this note might be different from the original. LA Grade C noted on EGD 01/2020  ? GERD (gastroesophageal reflux disease)   ? Herpes genitalis   ? History of chicken pox   ? History of kidney stones   ? ? ?Past Surgical History:  ?Procedure Laterality Date  ? CESAREAN SECTION    ? COLONOSCOPY WITH PROPOFOL N/A 10/03/2020  ? Procedure: COLONOSCOPY WITH PROPOFOL;  Surgeon: Lin Landsman, MD;  Location: [facility] ENDOSCOPY;  Service: Gastroenterology;  Laterality: N/A;  ? COLONOSCOPY WITH PROPOFOL N/A 10/04/2020  ? Procedure: COLONOSCOPY WITH PROPOFOL;  Surgeon: Lin Landsman, MD;  Location: [place];  Service: Endoscopy;  Laterality: N/A;  ? ESOPHAGOGASTRODUODENOSCOPY (EGD) WITH PROPOFOL N/A 11/19/2016  ? Procedure: ESOPHAGOGASTRODUODENOSCOPY (EGD) WITH PROPOFOL;  Surgeon: Katrina Silvas, MD;  Location: [facility] ENDOSCOPY;  Service: Endoscopy;  Laterality: N/A;  ? LAPAROSCOPY N/A   ? done at [facility] to release esophageal muscles  ? ? ?Social History:  reports that she quit smoking about 8  years ago. Her smoking use included cigarettes. She has a 22.50 pack-year smoking history. She has never used smokeless tobacco. She reports that she does not drink alcohol and does not use drugs. ? ?Family History:  ?Family History  ?Problem Relation Age of Onset  ? Arthritis Mother   ? Heart disease Father   ? Stroke Father   ? Breast cancer Neg Hx   ?  ? ?Prior to Admission medications   ?Medication Sig Start Date End Date Taking? Authorizing Provider  ?Nicotine 10 MG/ML SOLN Place 1 spray into the nose as needed. 05/02/21 07/31/21 Yes [provider]  ?albuterol (PROVENTIL HFA;VENTOLIN HFA) 108 (90 Base) MCG/ACT inhaler Inhale into the lungs every 6 (six) hours as needed for wheezing or shortness of breath.    [provider]  ?aspirin EC 81 MG tablet Take 81 mg by mouth daily. Swallow whole.    [provider]  ?Budeson-Glycopyrrol-Formoterol (BREZTRI AEROSPHERE) 160-9-4.8 MCG/ACT AERO Inhale into the lungs. 05/30/21 05/30/22  [provider]  ?cyclobenzaprine (FLEXERIL) 10 MG tablet Take 1 tablet (10 mg total) by mouth at bedtime. 06/18/21 07/18/21  Molli Barrows, MD  ?diclofenac Sodium (VOLTAREN) 1 % GEL Apply 4 g topically 4 (four) times daily. 12/14/19   Hyatt, Max T, DPM  ?diclofenac Sodium (VOLTAREN) 1 % GEL Apply 1 application. topically in the morning, at noon, in the evening, and at bedtime. ?Patient not taking: Reported on 06/18/2021    [provider]  ?FLUoxetine (PROZAC) 20 MG capsule Take 60 mg by mouth daily. 04/30/21   [provider]  ?gabapentin (NEURONTIN) 300 MG capsule TAKE 2 CAPSULES BY MOUTH AT BEDTIME ?Patient taking differently: Take 600 mg by mouth at bedtime. 01/30/21   Hyatt, Max T, DPM  ?lidocaine (LIDODERM) 5 % Place 1 patch onto the skin daily. 05/26/21   [provider]  ?naloxone (NARCAN) nasal spray 4 mg/0.1 mL PLEASE SEE ATTACHED FOR DETAILED DIRECTIONS 08/21/20   [provider]  ?OLANZapine (ZYPREXA) 5 MG tablet Take  5 mg by mouth daily. 12/24/18   [provider]  ?oxybutynin (DITROPAN XL) 10 MG 24 hr tablet Take 10 mg by mouth at bedtime.    [provider]  ?Oxycodone HCl 10 MG TABS PLEASE SEE ATTACHED FOR DETAILED DIRECTIONS ?Patient not taking: Reported on 06/18/2021 09/19/20   [provider]  ?Pancrelipase, Lip-Prot-Amyl, (ZENPEP) 40000-126000 units CPEP Take 2 capsules with the first bite of each meal and 1 capsule with the first bite of each snack ?Patient not taking: Reported on 02/21/2021 10/08/20   Lin Landsman, MD  ?pantoprazole (PROTONIX) 40 MG tablet Take 40 mg by mouth daily. 08/01/20   [provider]  ?SPIRIVA HANDIHALER 18 MCG inhalation capsule Place 1 capsule into inhaler and inhale daily. 05/29/21   [provider]  ?SPIRIVA RESPIMAT 1.25 MCG/ACT AERS Inhale 2 puffs into the lungs daily. 06/11/21   [provider]  ?sulfamethoxazole-trimethoprim (BACTRIM) 400-80 MG tablet Take 1 tablet by mouth 3 (three) times a week.    [provider]  ?TRELEGY ELLIPTA 100-62.5-25 MCG/ACT AEPB Inhale 1 puff into the lungs daily. 04/05/21   [provider]  ?vitamin B-12 (CYANOCOBALAMIN) 100 MCG tablet Take 100 mcg by mouth daily.    [provider]  ? ? ?Physical Exam: ?Vitals:  ? 06/20/21 0851 06/20/21 0900 06/20/21 0922 06/20/21 0936  ?BP:  118/66    ?Pulse: (!) 103 (!) 114 (!) 104   ?Resp: 17 18 (!) 23 19  ?Temp:      ?TempSrc:      ?SpO2: 98% 97% 98%   ?Weight:      ?Height:      ? ?General: Not in acute distress.  Thin body habitus ?HEENT: ?      Eyes: PERRL, EOMI, no scleral icterus. ?      ENT: No discharge from the ears and nose, no pharynx injection, no tonsillar enlargement.  ?      Neck: No JVD, no bruit, no mass felt. ?Heme: No neck lymph node enlargement. ?Cardiac: S1/S2, RRR, No murmurs, No gallops or rubs. ?Respiratory: Has wheezing bilaterally ?GI: Soft, nondistended, nontender, no rebound pain, no organomegaly, BS present. ?GU:  No hematuria ?Ext: No pitting leg edema bilaterally. 1+DP/PT pulse bilaterally. ?Musculoskeletal: No joint deformities, No joint redness or warmth, no limitation of ROM in spin. ?Skin: Has dog bite wound in right l

## 2021-06-20 NOTE — ED Notes (Signed)
Informed RN bed assigned 

## 2021-06-20 NOTE — ED Notes (Addendum)
Pt offered breathing treatment that is due. Pt stated she wanted to wait until she got to her new room.  ?

## 2021-06-20 NOTE — ED Notes (Signed)
RT at bedside weaning pt from BiPAP. Pt placed on 3L [place] at this time.  ?

## 2021-06-20 NOTE — ED Notes (Signed)
RT @ the bedside. 

## 2021-06-20 NOTE — ED Notes (Signed)
Secure chat sent to MD regarding clarification of diet order and BiPAP. Per MD, pt to remain NPO until BiPAP is removed.  ?

## 2021-06-20 NOTE — Progress Notes (Signed)
Admission profile updated. ?

## 2021-06-21 DIAGNOSIS — N179 Acute kidney failure, unspecified: Secondary | ICD-10-CM

## 2021-06-21 DIAGNOSIS — A419 Sepsis, unspecified organism: Secondary | ICD-10-CM

## 2021-06-21 DIAGNOSIS — J441 Chronic obstructive pulmonary disease with (acute) exacerbation: Secondary | ICD-10-CM | POA: Diagnosis not present

## 2021-06-21 DIAGNOSIS — J9601 Acute respiratory failure with hypoxia: Secondary | ICD-10-CM | POA: Diagnosis not present

## 2021-06-21 DIAGNOSIS — J189 Pneumonia, unspecified organism: Secondary | ICD-10-CM | POA: Diagnosis not present

## 2021-06-21 DIAGNOSIS — J9602 Acute respiratory failure with hypercapnia: Secondary | ICD-10-CM | POA: Diagnosis not present

## 2021-06-21 LAB — BASIC METABOLIC PANEL
Anion gap: 8 (ref 5–15)
BUN: 9 mg/dL (ref 8–23)
CO2: 28 mmol/L (ref 22–32)
Calcium: 8.9 mg/dL (ref 8.9–10.3)
Chloride: 103 mmol/L (ref 98–111)
Creatinine, Ser: 0.65 mg/dL (ref 0.44–1.00)
GFR, Estimated: >60 mL/min (ref >60)
Glucose, Bld: 120 mg/dL — ABNORMAL HIGH (ref 70–99)
Potassium: 3.8 mmol/L (ref 3.5–5.1)
Sodium: 139 mmol/L (ref 135–145)

## 2021-06-21 LAB — CBC
HCT: 28.5 % — ABNORMAL LOW (ref 36.0–46.0)
Hemoglobin: 9.4 g/dL — ABNORMAL LOW (ref 12.0–15.0)
MCH: 29.3 pg (ref 26.0–34.0)
MCHC: 33 g/dL (ref 30.0–36.0)
MCV: 88.8 fL (ref 80.0–100.0)
Platelets: 209 10*3/uL (ref 150–400)
RBC: 3.21 MIL/uL — ABNORMAL LOW (ref 3.87–5.11)
RDW: 14.8 % (ref 11.5–15.5)
WBC: 9.6 10*3/uL (ref 4.0–10.5)
nRBC: 0 % (ref 0.0–0.2)

## 2021-06-21 MED ORDER — BUDESONIDE 0.5 MG/2ML IN SUSP
1.0000 mg | Freq: Two times a day (BID) | RESPIRATORY_TRACT | Status: DC
  Administered 2021-06-21 – 2021-06-23 (×5): 1 mg via RESPIRATORY_TRACT
  Filled 2021-06-21 (×5): qty 4

## 2021-06-21 MED ORDER — BOOST / RESOURCE BREEZE PO LIQD CUSTOM
1.0000 | Freq: Three times a day (TID) | ORAL | Status: DC
Start: 2021-06-21 — End: 2021-06-24
  Administered 2021-06-21 – 2021-06-22 (×3): 1 via ORAL

## 2021-06-21 NOTE — Assessment & Plan Note (Addendum)
Condition improved, off oxygen. ?

## 2021-06-21 NOTE — Evaluation (Signed)
Physical Therapy Evaluation ?Patient Details ?Name: Katrina Simon ?MRN: [id number] ?DOB: 1950-06-16 ?Today's Date: 06/21/2021 ? ?History of Present Illness ? ANALYCIA KHOKHAR is a 71 y.o. female with medical history significant of COPD, former smoker, GERD, depression with anxiety, fibromyalgia, chronic pain syndrome, kidney stone, CKD-3A, who presents with shortness breath. ? ?  ?Clinical Impression ? Pt received in Semi-Fowler's position and agreeable to therapy.  Pt performed bed-level exercises with good technique.  Pt able to perform bed mobility with good technique and come upright at EOB.  Pt then transferred with CGA into standing and requested to go to the bathroom.  Pt assisted to the bathroom and provided CGA when necessary.  Pt then ambulated around the nursing station and back to room.  Pt with good technique, however focused on nasal cannula and IV at times instead of maneuvering the walker in appropriate places.  Pt transferred to recliner and left with all needs met.  Nursing notified of mobility and removal of purewick.  Current discharge plans to home with HHPT are appropriate at this time.  Pt will continue to benefit from skilled therapy in order to address deficits listed below. ? ?   ? ?Recommendations for follow up therapy are one component of a multi-disciplinary discharge planning process, led by the attending physician.  Recommendations may be updated based on patient status, additional functional criteria and insurance authorization. ? ?Follow Up Recommendations Home health PT ? ?  ?Assistance Recommended at Discharge PRN  ?Patient can return home with the following ? A little help with walking and/or transfers;A little help with bathing/dressing/bathroom ? ?  ?Equipment Recommendations None recommended by PT  ?Recommendations for Other Services ?    ?  ?Functional Status Assessment Patient has had a recent decline in their functional status and demonstrates the ability to make significant  improvements in function in a reasonable and predictable amount of time.  ? ?  ?Precautions / Restrictions Restrictions ?Weight Bearing Restrictions: No  ? ?  ? ?Mobility ? Bed Mobility ?Overal bed mobility: Modified Independent ?  ?  ?  ?  ?  ?  ?General bed mobility comments: increased time, HOB upright. ?  ? ?Transfers ?Overall transfer level: Needs assistance ?Equipment used: Rolling walker (2 wheels) ?Transfers: Sit to/from Stand ?Sit to Stand: Min guard ?  ?  ?  ?  ?  ?  ?  ? ?Ambulation/Gait ?Ambulation/Gait assistance: Min guard ?Gait Distance (Feet): 160 Feet ?Assistive device: Rolling walker (2 wheels) ?Gait Pattern/deviations: Step-through pattern, Decreased stride length ?Gait velocity: decreased ?  ?  ?General Gait Details: good steady gait, although worried about IV and O2 lines instead of ambulating. ? ?Stairs ?  ?  ?  ?  ?  ? ?Wheelchair Mobility ?  ? ?Modified Rankin (Stroke Patients Only) ?  ? ?  ? ?Balance Overall balance assessment: Mild deficits observed, not formally tested ?  ?  ?  ?  ?  ?  ?  ?  ?  ?  ?  ?  ?  ?  ?  ?  ?  ?  ?   ? ? ? ?Pertinent Vitals/Pain Pain Assessment ?Pain Assessment: No/denies pain  ? ? ?Home Living Family/patient expects to be discharged to:: Private residence ?Living Arrangements: Spouse/significant other;Children ?Available Help at Discharge: Family;Available PRN/intermittently ?Type of Home: Mobile home ?Home Access: Stairs to enter ?Entrance Stairs-Rails: Can reach both ?Entrance Stairs-Number of Steps: 2 ?  ?Home Layout: One level ?Home Equipment: Conservation officer, nature (  2 wheels);Cane - single point ?   ?  ?Prior Function Prior Level of Function : Independent/Modified Independent ?  ?  ?  ?  ?  ?  ?  ?  ?  ? ? ?Hand Dominance  ? Dominant Hand: Right ? ?  ?Extremity/Trunk Assessment  ? Upper Extremity Assessment ?Upper Extremity Assessment: Generalized weakness ?  ? ?Lower Extremity Assessment ?Lower Extremity Assessment: Generalized weakness ?  ? ?   ?Communication  ?  Communication: No difficulties  ?Cognition Arousal/Alertness: Awake/alert ?Behavior During Therapy: [facility] for tasks assessed/performed ?Overall Cognitive Status: Within Functional Limits for tasks assessed ?  ?  ?  ?  ?  ?  ?  ?  ?  ?  ?  ?  ?  ?  ?  ?  ?  ?  ?  ? ?  ?General Comments   ? ?  ?Exercises Total Joint Exercises ?Ankle Circles/Pumps: AROM, Strengthening, Both, 10 reps, Supine ?Quad Sets: AROM, Strengthening, Both, 10 reps, Supine ?Gluteal Sets: AROM, Strengthening, Both, 10 reps, Supine ?Hip ABduction/ADduction: AROM, Strengthening, Both, 10 reps, Supine ?Straight Leg Raises: AROM, Strengthening, Both, 10 reps, Supine ?Long Arc Quad: AROM, Strengthening, Both, 10 reps, Seated ?Marching in Standing: AROM, Strengthening, Both, 10 reps, Standing  ? ?Assessment/Plan  ?  ?PT Assessment Patient needs continued PT services  ?PT Problem List Decreased strength;Decreased activity tolerance;Decreased balance;Decreased mobility;Decreased knowledge of use of DME;Decreased safety awareness ? ?   ?  ?PT Treatment Interventions DME instruction;Gait training;Stair training;Functional mobility training;Therapeutic activities;Therapeutic exercise;Balance training   ? ?PT Goals (Current goals can be found in the Care Plan section)  ?Acute Rehab PT Goals ?Patient Stated Goal: to go home and be able to ride horses again ?PT Goal Formulation: With patient ?Time For Goal Achievement: 07/05/21 ?Potential to Achieve Goals: Good ? ?  ?Frequency Min 2X/week ?  ? ? ?Co-evaluation   ?  ?  ?  ?  ? ? ?  ?AM-PAC PT "6 Clicks" Mobility  ?Outcome Measure Help needed turning from your back to your side while in a flat bed without using bedrails?: A Little ?Help needed moving from lying on your back to sitting on the side of a flat bed without using bedrails?: A Little ?Help needed moving to and from a bed to a chair (including a wheelchair)?: A Little ?Help needed standing up from a chair using your arms (e.g., wheelchair or bedside  chair)?: A Little ?Help needed to walk in hospital room?: A Little ?Help needed climbing 3-5 steps with a railing? : A Little ?6 Click Score: 18 ? ?  ?End of Session Equipment Utilized During Treatment: Gait belt ?Activity Tolerance: Patient tolerated treatment well ?Patient left: in chair;with call bell/phone within reach;with chair alarm set ?Nurse Communication: Mobility status ?PT Visit Diagnosis: Unsteadiness on feet (R26.81);Muscle weakness (generalized) (M62.81) ?  ? ?Time: 6237-6283 ?PT Time Calculation (min) (ACUTE ONLY): 48 min ? ? ?Charges:   PT Evaluation ?$PT Eval Low Complexity: 1 Low ?PT Treatments ?$Gait Training: 8-22 mins ?$Therapeutic Exercise: 8-22 mins ?$Therapeutic Activity: 8-22 mins ?  ?   ? ? ?Gwenlyn Saran, PT, DPT ?06/21/21, 3:06 PM ? ? ?Christie Nottingham ?06/21/2021, 3:03 PM ? ?

## 2021-06-21 NOTE — Assessment & Plan Note (Signed)
Neurontin on a as needed pain medicine. ?

## 2021-06-21 NOTE — Assessment & Plan Note (Signed)
Patient had a dog bite 3 weeks ago by her home dog.  The dog has a full immunization.  She has been treated with antibiotics, currently the wound does not seem to be infected.  She will go back to [facility] for skin graft in the near future. ?

## 2021-06-21 NOTE — Progress Notes (Signed)
?  Progress Note ? ? ?Patient: Katrina Simon [id number]:[id number] DOB: October 04, 1950 DOA: 06/20/2021     1 ?DOS: the patient was seen and examined on 06/21/2021 ?  ?Brief hospital course: ?Katrina Simon is a 71 y.o. female with medical history significant of COPD, former smoker, GERD, depression with anxiety, fibromyalgia, chronic pain syndrome, kidney stone, CKD-3A, who presents with shortness breath. ?Upon arriving the hospital, she met sepsis criteria with tachycardia, leukocytosis and acute kidney injury.  Chest x-ray showed COPD with a right middle lung airspace disease concerning for pneumonia. ?She is treated with IV steroids, antibiotics with Rocephin and Zithromax. ? ?Assessment and Plan: ?* Acute respiratory failure with hypoxia and hypercapnia (HCC) ?Patient had secondary short of breath, required 3 L oxygen.  Continue wean oxygen.  This is secondary to pneumonia and COPD exacerbation. ? ?COPD exacerbation ([place]) ?Patient still has some bronchospasm, continue steroids.  Also on antibiotics for pneumonia ? ?Sepsis ([place]) ?POA. ?Patient has significant tachycardia, leukocytosis and acute kidney injury consistent with sepsis.  This is a secondary to pneumonia.  Condition has improved today.  Continue antibiotics. ? ?AKI (acute kidney injury) ([place]) ?I reviewed patient chart, patient does not have chronic kidney disease, she has acute kidney injury.  Her renal function has improved to normal after giving fluids. ? ?Dog bite_right leg ?Patient had a dog bite 3 weeks ago by her home dog.  The dog has a full immunization.  She has been treated with antibiotics, currently the wound does not seem to be infected.  She will go back to [facility] for skin graft in the near future. ? ?Depression with anxiety ?Continue Prozac. ? ?Protein-calorie malnutrition, severe ?Continue protein supplements. ? ?Chronic pain syndrome ?As needed pain medicine. ? ?Fibromyalgia ?Neurontin on a as needed pain medicine. ? ? ? ? ?  ? ?Subjective:   ?Patient is still requiring 2 L oxygen, still signal short of breath with minimal exertion. ?Cough, nonproductive. ?No fever chills ? ?Physical Exam: ?Vitals:  ? 06/21/21 0747 06/21/21 0800 06/21/21 1131 06/21/21 1224  ?BP: 124/71   138/69  ?Pulse: 87 90 89 (!) 101  ?Resp: $Remov'17 18 18 17  'ESSAHc$ ?Temp: 97.9 ?F (36.6 ?C)   98.3 ?F (36.8 ?C)  ?TempSrc: Oral     ?SpO2: 97% 95% 97% 95%  ?Weight:      ?Height:      ? ?General exam: Appears calm and comfortable  ?Respiratory system: Has some wheezes. Respiratory effort normal. ?Cardiovascular system: S1 & S2 heard, RRR. No JVD, murmurs, rubs, gallops or clicks. No pedal edema. ?Gastrointestinal system: Abdomen is nondistended, soft and nontender. No organomegaly or masses felt. Normal bowel sounds heard. ?Central nervous system: Alert and oriented. No focal neurological deficits. ?Extremities: Symmetric 5 x 5 power. ?Skin: No rashes, lesions or ulcers ?Psychiatry: Judgement and insight appear normal. Mood & affect appropriate.  ? ?Data Reviewed: ? ?X-ray and lab results reviewed. ? ?Family Communication: Husband updated ? ?Disposition: ?Status is: Inpatient ?Remains inpatient appropriate because: Arterial disease, IV antibiotics and steroids. ? Planned Discharge Destination: Home with Home Health ? ? ? ?Time spent: 26 minutes ? ?Author: ?Sharen Hones, MD ?06/21/2021 12:53 PM ? ?For on call review [email or web address].  ?

## 2021-06-21 NOTE — Assessment & Plan Note (Addendum)
Encourage increased protein intake ?

## 2021-06-21 NOTE — Assessment & Plan Note (Addendum)
POA. ?Patient has significant tachycardia, leukocytosis and acute kidney injury consistent with sepsis.  This is secondary to pneumonia.  ?Currently, patient hemodynamically stable.  Condition has resolved. ?. ?

## 2021-06-21 NOTE — Assessment & Plan Note (Signed)
As needed pain medicine. ?

## 2021-06-21 NOTE — Assessment & Plan Note (Signed)
Continue Prozac

## 2021-06-21 NOTE — Hospital Course (Signed)
Katrina Simon is a 71 y.o. female with medical history significant of COPD, former smoker, GERD, depression with anxiety, fibromyalgia, chronic pain syndrome, kidney stone, CKD-3A, who presents with shortness breath. ?Upon arriving the hospital, she met sepsis criteria with tachycardia, leukocytosis and acute kidney injury.  Chest x-ray showed COPD with a right middle lung airspace disease concerning for pneumonia. ?She is treated with IV steroids, antibiotics with Rocephin and Zithromax. ?

## 2021-06-21 NOTE — Assessment & Plan Note (Signed)
I reviewed patient chart, patient does not have chronic kidney disease, she has acute kidney injury.  Her renal function has improved to normal after giving fluids. ?

## 2021-06-21 NOTE — Assessment & Plan Note (Addendum)
Condition much improved, short of breath and bronchospasm much better.  We will continue steroid taper and follow-up with PCP as outpatient. ?

## 2021-06-21 NOTE — Progress Notes (Addendum)
Nutrition Follow-up ? ?DOCUMENTATION CODES:  ? ?Non-severe (moderate) malnutrition in context of chronic illness ? ?INTERVENTION:  ? ?-Continue MVI with minerals daily ?-Continue 500 mg vitamin C BID ?-Continue 220 mg zinc sulfate daily x 14 days ?-D/c Ensure  ?-Boost Breeze po TID, each supplement provides 250 kcal and 9 grams of protein  ? ?NUTRITION DIAGNOSIS:  ? ?Moderate Malnutrition related to chronic illness (COPD) as evidenced by mild fat depletion, moderate fat depletion, mild muscle depletion, moderate muscle depletion. ? ?Ongoing ? ?GOAL:  ? ?Patient will meet greater than or equal to 90% of their needs ? ?Progressing  ? ?MONITOR:  ? ?PO intake, Supplement acceptance, Diet advancement, Labs, Weight trends, Skin, I & O's ? ?REASON FOR ASSESSMENT:  ? ?Consult ?Assessment of nutrition requirement/status ? ?ASSESSMENT:  ? ?Katrina Simon is a 71 y.o. female with a history of COPD, GERD, fibromyalgia who presents for evaluation of shortness of breath.  Patient has had progressively worsening shortness of breath for the last 2 days.  Initially her inhalers were helping at home but this evening they stopped working.  She has a cough productive of white phlegm.  She is complaining of chills and subjective fevers.  She is complaining of chest tightness.  She is not on oxygen at home.  She denies history of PE or DVT, recent travel immobilization, leg pain or swelling, hemoptysis, exogenous hormones.  She also denies vomiting or diarrhea. ? ?Reviewed I/O's: +1.5 L x 24 hours and +3.3 L since admission ? ?UOP: 1.5 L x 24 hours  ? ?Spoke with pt and husband at bedside. Pt reports feeling better, but complains of cough. Pt reports she consumed 50% of her breakfast (pancakes and bacon) today. She shares that she has had decreased oral intake over the past 3 months secondary top early satiety. She typically consumes 2 meals per day (Breakfast: pancakes and bacon; Dinner: sandwich and onion rings).  ? ?Per reports her  UBW is around 180# and estimates she has lost about 40 pounds over the past year or so. Pt shares her UBW has been around 11-114#.  ? ?Discussed importance of good meal and supplement intake to promote healing. Pt does not like Ensure ("it sends me straight to the bathroom"). She is amenable to [employer].  ? ?Medications reviewed and include vitamin C, vitamin D3, solu-medrol, zinc sulfate, and vitamin B-12.  ? ?Labs reviewed.  ? ?NUTRITION - FOCUSED PHYSICAL EXAM: ? ?Flowsheet Row Most Recent Value  ?Orbital Region Mild depletion  ?Upper Arm Region Moderate depletion  ?Thoracic and Lumbar Region Mild depletion  ?Buccal Region Mild depletion  ?Temple Region Moderate depletion  ?Clavicle Bone Region Moderate depletion  ?Clavicle and Acromion Bone Region Moderate depletion  ?Scapular Bone Region Moderate depletion  ?Dorsal Hand Moderate depletion  ?Patellar Region Mild depletion  ?Anterior Thigh Region Mild depletion  ?Posterior Calf Region Mild depletion  ?Edema (RD Assessment) Mild  ?Hair Reviewed  ?Eyes Reviewed  ?Mouth Reviewed  ?Skin Reviewed  ?Nails Reviewed  ? ?  ? ? ?Diet Order:   ?Diet Order   ? ?       ?  Diet regular Room service appropriate? Yes; Fluid consistency: Thin  Diet effective now       ?  ? ?  ?  ? ?  ? ? ?EDUCATION NEEDS:  ? ?Education needs have been addressed ? ?Skin:  Skin Assessment: Skin Integrity Issues: ?Skin Integrity Issues:: Other (Comment) ?Other: full thickness wound to rt lateral thigh ? ?  Last BM:  06/19/21 ? ?Height:  ? ?Ht Readings from Last 1 Encounters:  ?06/20/21 '5\' 5"'$  (1.651 m)  ? ? ?Weight:  ? ?Wt Readings from Last 1 Encounters:  ?06/20/21 51.7 kg  ? ? ?Ideal Body Weight:  56.8 kg ? ?BMI:  Body mass index is 18.97 kg/m?. ? ?Estimated Nutritional Needs:  ? ?Kcal:  1550-1750 ? ?Protein:  75-90 grams ? ?Fluid:  > 1.5 L ? ? ? ?Loistine Chance, RD, LDN, CDCES ?Registered Dietitian II ?Certified Diabetes Care and Education Specialist ?Please refer to [facility] for RD and/or RD  on-call/weekend/after hours pager  ?

## 2021-06-22 DIAGNOSIS — J441 Chronic obstructive pulmonary disease with (acute) exacerbation: Secondary | ICD-10-CM | POA: Diagnosis not present

## 2021-06-22 DIAGNOSIS — J9601 Acute respiratory failure with hypoxia: Secondary | ICD-10-CM | POA: Diagnosis not present

## 2021-06-22 DIAGNOSIS — E440 Moderate protein-calorie malnutrition: Secondary | ICD-10-CM | POA: Insufficient documentation

## 2021-06-22 DIAGNOSIS — J189 Pneumonia, unspecified organism: Secondary | ICD-10-CM | POA: Diagnosis not present

## 2021-06-22 DIAGNOSIS — J9602 Acute respiratory failure with hypercapnia: Secondary | ICD-10-CM | POA: Diagnosis not present

## 2021-06-22 LAB — STREP PNEUMONIAE URINARY ANTIGEN: Strep Pneumo Urinary Antigen: NEGATIVE

## 2021-06-22 NOTE — Progress Notes (Signed)
Transported via wheelchair to new room on 1C- 116.  ?

## 2021-06-22 NOTE — Progress Notes (Signed)
Patient level of care downgraded to Med-Surg. Cardiac monitoring discontinued. Patient to transfer to 1C- Room 116. Report called via phone to receiving nurse, Vilma Meckel, RN. Handoff completed via phone.  ?Patient to transport via wheelchair to new unit. Call placed to husband, Jourden Delmont [phone] to notify of transfer, no answer.  ?

## 2021-06-22 NOTE — Assessment & Plan Note (Addendum)
Condition improved, continue 2 more days of cefdinir ?

## 2021-06-22 NOTE — Progress Notes (Signed)
?  Progress Note ? ? ?Patient: Katrina Simon [id number] DOB: 1951-01-24 DOA: 06/20/2021     2 ?DOS: the patient was seen and examined on 06/22/2021 ?  ?Brief hospital course: ?SHYLO ZAMOR is a 71 y.o. female with medical history significant of COPD, former smoker, GERD, depression with anxiety, fibromyalgia, chronic pain syndrome, kidney stone, CKD-3A, who presents with shortness breath. ?Upon arriving the hospital, she met sepsis criteria with tachycardia, leukocytosis and acute kidney injury.  Chest x-ray showed COPD with a right middle lung airspace disease concerning for pneumonia. ?She is treated with IV steroids, antibiotics with Rocephin and Zithromax. ? ?Assessment and Plan: ?* Acute respiratory failure with hypoxia and hypercapnia (HCC) ?Condition much improved, currently off oxygen. ? ?CAP (community acquired pneumonia) ?Continue antibiotics with Rocephin and Zithromax ? ?COPD exacerbation ([place]) ?Condition gradually improving, still has some bronchospasm.  But much improved, will change steroids to oral. ? ?Sepsis ([place]) ?POA. ?Patient has significant tachycardia, leukocytosis and acute kidney injury consistent with sepsis.  This is secondary to pneumonia.  ?Condition has improved. ?. ? ?AKI (acute kidney injury) ([place]) ?I reviewed patient chart, patient does not have chronic kidney disease, she has acute kidney injury.  Her renal function has improved to normal after giving fluids. ? ?Dog bite_right leg ?Patient had a dog bite 3 weeks ago by her home dog.  The dog has a full immunization.  She has been treated with antibiotics, currently the wound does not seem to be infected.  She will go back to [facility] for skin graft in the near future. ? ?Depression with anxiety ?Continue Prozac. ? ?Protein-calorie malnutrition, severe ?On protein supplements. ? ?Chronic pain syndrome ?As needed pain medicine. ? ?Fibromyalgia ?Neurontin on a as needed pain medicine. ? ? ? ? ?  ? ?Subjective:  ?Patient doing much  better today, still has some wheezing, but overall improved.  I was able to take off oxygen. ? ?Physical Exam: ?Vitals:  ? 06/22/21 0743 06/22/21 0919 06/22/21 1116 06/22/21 1117  ?BP:   (!) 148/85   ?Pulse:   88   ?Resp:   17   ?Temp:   98.4 ?F (36.9 ?C)   ?TempSrc:      ?SpO2: 100% 97% 91% 91%  ?Weight:      ?Height:      ?General exam: Appears calm and comfortable  ?Respiratory system: Decreased breathing sounds with some wheezing. Respiratory effort normal. ?Cardiovascular system: S1 & S2 heard, RRR. No JVD, murmurs, rubs, gallops or clicks. No pedal edema. ?Gastrointestinal system: Abdomen is nondistended, soft and nontender. No organomegaly or masses felt. Normal bowel sounds heard. ?Central nervous system: Alert and oriented. No focal neurological deficits. ?Extremities: Symmetric 5 x 5 power. ?Skin: No rashes, lesions or ulcers ?Psychiatry: Judgement and insight appear normal. Mood & affect appropriate.   ? ?Data Reviewed: ? ?Reviewed lab results ? ?Family Communication:  ? ?Disposition: ?Status is: Inpatient ?Remains inpatient appropriate because: Severity of disease and IV treatment. ? Planned Discharge Destination: Home with Home Health ? ? ? ?Time spent: 24 minutes ? ?Author: ?Sharen Hones, MD ?06/22/2021 11:18 AM ? ?For on call review [email or web address]  ?

## 2021-06-23 DIAGNOSIS — J9602 Acute respiratory failure with hypercapnia: Secondary | ICD-10-CM | POA: Diagnosis not present

## 2021-06-23 DIAGNOSIS — J441 Chronic obstructive pulmonary disease with (acute) exacerbation: Secondary | ICD-10-CM | POA: Diagnosis not present

## 2021-06-23 DIAGNOSIS — J9601 Acute respiratory failure with hypoxia: Secondary | ICD-10-CM | POA: Diagnosis not present

## 2021-06-23 DIAGNOSIS — J189 Pneumonia, unspecified organism: Secondary | ICD-10-CM | POA: Diagnosis not present

## 2021-06-23 LAB — TOXASSURE SELECT 13 (MW), URINE

## 2021-06-23 MED ORDER — BUDESONIDE 0.5 MG/2ML IN SUSP
0.5000 mg | Freq: Two times a day (BID) | RESPIRATORY_TRACT | Status: DC
  Administered 2021-06-23 – 2021-06-24 (×2): 0.5 mg via RESPIRATORY_TRACT
  Filled 2021-06-23 (×2): qty 2

## 2021-06-23 MED ORDER — IPRATROPIUM-ALBUTEROL 0.5-2.5 (3) MG/3ML IN SOLN
3.0000 mL | Freq: Three times a day (TID) | RESPIRATORY_TRACT | Status: DC
  Administered 2021-06-23 – 2021-06-24 (×2): 3 mL via RESPIRATORY_TRACT
  Filled 2021-06-23 (×2): qty 3

## 2021-06-23 MED ORDER — FLUTICASONE PROPIONATE 50 MCG/ACT NA SUSP
2.0000 | Freq: Every day | NASAL | Status: DC
  Administered 2021-06-23 – 2021-06-24 (×2): 2 via NASAL
  Filled 2021-06-23: qty 16

## 2021-06-23 NOTE — Progress Notes (Signed)
?Progress Note ? ? ?Patient: Katrina Simon [id number]:[id number] DOB: 10-07-1950 DOA: 06/20/2021     3 ?DOS: the patient was seen and examined on 06/23/2021 ?  ?Brief hospital course: ?TAMORA HUNEKE is a 71 y.o. female with medical history significant of COPD, former smoker, GERD, depression with anxiety, fibromyalgia, chronic pain syndrome, kidney stone, CKD-3A, who presents with shortness breath. ?Upon arriving the hospital, she met sepsis criteria with tachycardia, leukocytosis and acute kidney injury.  Chest x-ray showed COPD with a right middle lung airspace disease concerning for pneumonia. ?She is treated with IV steroids, antibiotics with Rocephin and Zithromax. ? ?Assessment and Plan: ?* Acute respiratory failure with hypoxia and hypercapnia (HCC) ?Patient had increased oxygen requirement last night, also had increased bronchospasm.  Continue oxygen treatment.  Also add incentive spirometer. ? ?CAP (community acquired pneumonia) ?Continue Rocephin, completed  Zithromax ? ?COPD exacerbation ([place]) ?Patient has more bronchospasm this morning, she had an increased oxygen requirement last night. ?I will continue IV steroids and antibiotics. ?I will obtain speech therapy to rule out risk of aspiration. ?She has significant nasal congestion, concern for postnasal draining, will start Flonase. ?I will hold patient for another day, plan to discharge home tomorrow. ? ?Sepsis ([place]) ?POA. ?Patient has significant tachycardia, leukocytosis and acute kidney injury consistent with sepsis.  This is secondary to pneumonia.  ?Currently, patient hemodynamically stable.  Condition has resolved. ?. ? ?AKI (acute kidney injury) ([place]) ?I reviewed patient chart, patient does not have chronic kidney disease, she has acute kidney injury.  Her renal function has improved to normal after giving fluids. ? ?Dog bite_right leg ?Patient had a dog bite 3 weeks ago by her home dog.  The dog has a full immunization.  She has been treated with  antibiotics, currently the wound does not seem to be infected.  She will go back to [facility] for skin graft in the near future. ? ?Depression with anxiety ?Continue Prozac. ? ?Protein-calorie malnutrition, severe ?On protein supplements. ? ?Chronic pain syndrome ?As needed pain medicine. ? ?Fibromyalgia ?Neurontin on a as needed pain medicine. ? ? ? ? ?  ? ?Subjective:  ?Patient has more wheezing this morning when she woke up.  Cough, nonproductive. ?Had an increased oxygen requirement last night. ? ?Physical Exam: ?Vitals:  ? 06/22/21 2006 06/23/21 8115 06/23/21 0116 06/23/21 0508  ?BP: 140/69   (!) 145/71  ?Pulse: 77  87 76  ?Resp: 13   14  ?Temp: 98.2 ?F (36.8 ?C)   (!) 97.5 ?F (36.4 ?C)  ?TempSrc: Oral     ?SpO2: 90% 96% 98% 100%  ?Weight:      ?Height:      ? ?General exam: Appears calm and comfortable  ?Respiratory system: Decreased breath sounds with wheezes. Respiratory effort normal. ?Cardiovascular system: S1 & S2 heard, RRR. No JVD, murmurs, rubs, gallops or clicks. No pedal edema. ?Gastrointestinal system: Abdomen is nondistended, soft and nontender. No organomegaly or masses felt. Normal bowel sounds heard. ?Central nervous system: Alert and oriented. No focal neurological deficits. ?Extremities: Symmetric 5 x 5 power. ?Skin: No rashes, lesions or ulcers ?Psychiatry: Judgement and insight appear normal. Mood & affect appropriate.  ? ?Data Reviewed: ? ?Lab results reviewed.  Blood culture negative after 3 days. ? ?Family Communication: Husband updated on the phone ? ?Disposition: ?Status is: Inpatient ?Remains inpatient appropriate because: Severity of disease and IV treatment ? Planned Discharge Destination: Home with Home Health ? ? ? ?Time spent: 28 minutes ? ?Author: ?Sharen Hones, MD ?  06/23/2021 9:52 AM ? ?For on call review [email or web address]  ?

## 2021-06-23 NOTE — Evaluation (Signed)
Clinical/Bedside Swallow Evaluation ?Patient Details  ?Name: Katrina Simon ?MRN: [id number] ?Date of Birth: [birth year] ? ?Today's Date: 06/23/2021 ?Time: SLP Start Time (ACUTE ONLY): 1020 SLP Stop Time (ACUTE ONLY): 1035 ?SLP Time Calculation (min) (ACUTE ONLY): 15 min ? ?Past Medical History:  ?Past Medical History:  ?Diagnosis Date  ? Achalasia   ? Allergy   ? Arthritis   ? Depression   ? Emphysema of lung ([place])   ? Fibromyalgia   ? Gastroesophageal reflux disease with esophagitis 01/26/2020  ? Formatting of this note might be different from the original. LA Grade C noted on EGD 01/2020  ? GERD (gastroesophageal reflux disease)   ? Herpes genitalis   ? History of chicken pox   ? History of kidney stones   ? ?Past Surgical History:  ?Past Surgical History:  ?Procedure Laterality Date  ? CESAREAN SECTION    ? COLONOSCOPY WITH PROPOFOL N/A 10/03/2020  ? Procedure: COLONOSCOPY WITH PROPOFOL;  Surgeon: Lin Landsman, MD;  Location: [facility] ENDOSCOPY;  Service: Gastroenterology;  Laterality: N/A;  ? COLONOSCOPY WITH PROPOFOL N/A 10/04/2020  ? Procedure: COLONOSCOPY WITH PROPOFOL;  Surgeon: Lin Landsman, MD;  Location: [facility];  Service: Endoscopy;  Laterality: N/A;  ? ESOPHAGOGASTRODUODENOSCOPY (EGD) WITH PROPOFOL N/A 11/19/2016  ? Procedure: ESOPHAGOGASTRODUODENOSCOPY (EGD) WITH PROPOFOL;  Surgeon: Manya Silvas, MD;  Location: [facility] ENDOSCOPY;  Service: Endoscopy;  Laterality: N/A;  ? LAPAROSCOPY N/A   ? done at [facility] to release esophageal muscles  ? ?HPI:  ?Per 71 H&P "Katrina Simon is a 71 y.o. female with medical history significant of COPD, former smoker, GERD, depression with anxiety, fibromyalgia, chronic pain syndrome, kidney stone, CKD-3A, who presents with shortness breath.     Patient states that she has shortness breath for more than 2 days, which has been progressively worsening.  Patient has wheezing and cough with little sputum production.  She also complains of  sharp chest pain which is located in the front chest, mild to moderate, nonradiating, pleuritic, aggravated by coughing.  Patient has nausea, no vomiting, diarrhea or abdominal pain.  No symptoms of UTI.  Of note, patient had dog bite with an open wound to her right leg, 2.5 weeks ago.  Husband states that she will be seen at [facility] for possible skin grafting tomorrow.     Patient was found to have severe respiratory distress, cannot speak in full sentence, oxygen desaturating to 60s on room air (patient is not using oxygen normally), BiPAP started in ED."  ?  ?Assessment / Plan / Recommendation  ?Clinical Impression ? Pt seen for clinical swallowing evaluation. Pt alert, pleasant, and cooperative. Denies dysphagia. On 2L/min O2 via [place].  ? ?Oral motor examination completed and remarkable for +upper denture and edentulous lower arch. Pt reports owning lower denture, but it being ill-fitting given recent weight loss.  ? ?Per chart review, current temp and WBC WNL. CXR, 06/20/21, "Emphysematous and chronic changes, with superimposed opacities in the peripheral right mid lung above the horizontal fissure and in the right lower lung field, most likely due to multilobar pneumonia. Follow-up study recommended to ensure clearing." ? ?Pt observed with trials of thin liquids (via straw), puree, and solid. Pt demonstrated an intact oral swallow across trials. Per clinical assessment, pharyngeal swallow appeared [facility] To palpation, pt with seemingly timely swallow initiation and seemingly adequate laryngeal elevation. No change to vocal quality across trials. No overt or subtle s/sx pharyngeal dysphagia. ? ?Recommend continuation of a regular  consistency diet with thin liquids and safe swallowing strategies/aspiration precautions and reflux precautions as outlined below.  ? ?Pt is at mildly increased risk for aspiration/aspiration PNA given dental status and medical comorbidities (e.g. respiratory status, COPD, GERD.  achalasia).  ? ?Pt and RN made aware of results, recommendations, and SLP POC. Particular emphasis placed on rate of intake and rest breaks as needed with intake for energy conservation in the setting of acute respiratory failure with hypoxia and hypercapnia. Pt verbalized understanding/agreement.  ? ?SLP to sign off as pt has no acute SLP needs at this time.  ? ?SLP Visit Diagnosis: Dysphagia, unspecified (R13.10) ?   ?Aspiration Risk ? Mild aspiration risk  ?  ?Diet Recommendation Regular;Thin liquid  ? ?Medication Administration: Whole meds with liquid ?Supervision: Patient able to self feed ?Compensations: Slow rate;Small sips/bites (rest breaks PRN; REFLUX precautions) ?Postural Changes: Seated upright at 90 degrees;Remain upright for at least 30 minutes after po intake  ?  ?Other  Recommendations Oral Care Recommendations: Oral care QID (denture care; set up assistance)   ? ?Recommendations for follow up therapy are one component of a multi-disciplinary discharge planning process, led by the attending physician.  Recommendations may be updated based on patient status, additional functional criteria and insurance authorization. ? ?Follow up Recommendations No SLP follow up  ? ? ?  ?Assistance Recommended at Discharge  (defer to OT/PT)  ?Functional Status Assessment Patient has not had a recent decline in their functional status  ?Frequency and Duration  (n/a)  ? (n/a) ?  ?   ? ?Prognosis Prognosis for Safe Diet Advancement: Good  ? ?  ? ?Swallow Study   ?General Date of Onset: 06/20/21 ?HPI: Per 65 H&P "Katrina Simon is a 71 y.o. female with medical history significant of COPD, former smoker, GERD, depression with anxiety, fibromyalgia, chronic pain syndrome, kidney stone, CKD-3A, who presents with shortness breath.     Patient states that she has shortness breath for more than 2 days, which has been progressively worsening.  Patient has wheezing and cough with little sputum production.  She also  complains of sharp chest pain which is located in the front chest, mild to moderate, nonradiating, pleuritic, aggravated by coughing.  Patient has nausea, no vomiting, diarrhea or abdominal pain.  No symptoms of UTI.  Of note, patient had dog bite with an open wound to her right leg, 2.5 weeks ago.  Husband states that she will be seen at [facility] for possible skin grafting tomorrow.     Patient was found to have severe respiratory distress, cannot speak in full sentence, oxygen desaturating to 60s on room air (patient is not using oxygen normally), BiPAP started in ED." ?Type of Study: Bedside Swallow Evaluation ?Previous Swallow Assessment: unknown ?Diet Prior to this Study: Regular;Thin liquids ?Temperature Spikes Noted: Yes (99.1 overnight) ?Respiratory Status: Nasal cannula (2L/min) ?History of Recent Intubation: No ?Behavior/Cognition: Alert;Cooperative;Pleasant mood ?Oral Cavity Assessment: Within Functional Limits ?Oral Care Completed by SLP: Recent completion by staff ?Oral Cavity - Dentition: Dentures, top (edentulous lower arch) ?Vision: Functional for self-feeding ?Self-Feeding Abilities: Able to feed self ?Patient Positioning: Upright in bed ?Baseline Vocal Quality: Normal ?Volitional Cough: Strong ?Volitional Swallow: Able to elicit  ?  ?Oral/Motor/Sensory Function Overall Oral Motor/Sensory Function: Within functional limits   ?Thin Liquid Thin Liquid: Within functional limits ?Presentation: Straw ?Other Comments: ~4 oz; Dr. Malachi Bonds; single and sequential sips  ?  ?Puree Puree: Within functional limits ?Presentation: Self Fed ?Other Comments: ~ 2 oz   ?  Solid ? ? ?  Solid: Within functional limits ?Presentation: Self Fed ?Other Comments: x1 graham cracker; presented in halves  ? ?  ?Katrina Simon, M.S., CCC-SLP ?Speech-Language Pathologist ?[facility] ?[phone] ([place])  ? ?Quintella Baton ?06/23/2021,11:58 AM ? ? ? ?

## 2021-06-24 DIAGNOSIS — J9602 Acute respiratory failure with hypercapnia: Secondary | ICD-10-CM | POA: Diagnosis not present

## 2021-06-24 DIAGNOSIS — J441 Chronic obstructive pulmonary disease with (acute) exacerbation: Secondary | ICD-10-CM | POA: Diagnosis not present

## 2021-06-24 DIAGNOSIS — J9601 Acute respiratory failure with hypoxia: Secondary | ICD-10-CM | POA: Diagnosis not present

## 2021-06-24 DIAGNOSIS — J189 Pneumonia, unspecified organism: Secondary | ICD-10-CM | POA: Diagnosis not present

## 2021-06-24 DIAGNOSIS — L98499 Non-pressure chronic ulcer of skin of other sites with unspecified severity: Secondary | ICD-10-CM

## 2021-06-24 LAB — LEGIONELLA PNEUMOPHILA SEROGP 1 UR AG: L. pneumophila Serogp 1 Ur Ag: NEGATIVE

## 2021-06-24 MED ORDER — PREDNISONE 10 MG PO TABS: ORAL_TABLET | ORAL | 0 refills | Status: AC

## 2021-06-24 MED ORDER — CEFDINIR 300 MG PO CAPS: 300.0000 mg | ORAL_CAPSULE | Freq: Two times a day (BID) | ORAL | 0 refills | Status: AC

## 2021-06-24 MED ORDER — TRELEGY ELLIPTA 100-62.5-25 MCG/ACT IN AEPB: 1.0000 | INHALATION_SPRAY | Freq: Every day | RESPIRATORY_TRACT | 0 refills | Status: DC

## 2021-06-24 NOTE — Discharge Summary (Addendum)
Physician Discharge Summary   Patient: Katrina Simon: [id number] DOB: [birth year]  Admit date:     06/20/2021  Discharge date: 06/24/21  Discharge Physician: Marrion Coy   PCP: Rayetta Humphrey, MD   Recommendations at discharge:   Follow-up with PCP in 1 week.  Discharge Diagnoses: Principal Problem:   Acute respiratory failure with hypoxia and hypercapnia (HCC) Active Problems:   COPD exacerbation (HCC)   CAP (community acquired pneumonia)   Fibromyalgia   Chronic pain syndrome   Protein-calorie malnutrition, severe   Depression with anxiety   Dog bite_right leg   AKI (acute kidney injury) (HCC)   Sepsis (HCC)   Skin ulcer (HCC)  Resolved Problems:   * No resolved hospital problems. *  Hospital Course: Katrina Simon is a 71 y.o. female with medical history significant of COPD, former smoker, GERD, depression with anxiety, fibromyalgia, chronic pain syndrome, kidney stone, CKD-3A, who presents with shortness breath. Upon arriving the hospital, she met sepsis criteria with tachycardia, leukocytosis and acute kidney injury.  Chest x-ray showed COPD with a right middle lung airspace disease concerning for pneumonia. She is treated with IV steroids, antibiotics with Rocephin and Zithromax.  Assessment and Plan: * Acute respiratory failure with hypoxia and hypercapnia (HCC) Condition improved, off oxygen.  CAP (community acquired pneumonia) Condition improved, continue 2 more days of cefdinir  COPD exacerbation (HCC) Condition much improved, short of breath and bronchospasm much better.  We will continue steroid taper and follow-up with PCP as outpatient.  Skin ulcer (HCC)   Chronic leg wound, follow with PCP.   Sepsis (HCC) POA. Patient has significant tachycardia, leukocytosis and acute kidney injury consistent with sepsis.  This is secondary to pneumonia.  Currently, patient hemodynamically stable.  Condition has resolved. Marland Kitchen  AKI (acute kidney injury) (HCC) I  reviewed patient chart, patient does not have chronic kidney disease, she has acute kidney injury.  Her renal function has improved to normal after giving fluids.  Dog bite_right leg Patient had a dog bite 3 weeks ago by her home dog.  The dog has a full immunization.  She has been treated with antibiotics, currently the wound does not seem to be infected.  She will go back to [facility] for skin graft in the near future.  Depression with anxiety Continue Prozac.  Protein-calorie malnutrition, severe Encourage increased protein intake  Chronic pain syndrome As needed pain medicine.  Fibromyalgia Neurontin on a as needed pain medicine.   Addendum. Restarted Trelegy at home, discontinued Spiriva.      Consultants: None Procedures performed: None  Disposition: Home Diet recommendation:  Discharge Diet Orders (From admission, onward)     Start     Ordered   06/24/21 0000  Diet - low sodium heart healthy        06/24/21 0950           Cardiac diet DISCHARGE MEDICATION: Allergies as of 06/24/2021       Reactions   Penicillins Hives   Aspirin Nausea And Vomiting   Statins Other (See Comments)   Myalgia        Medication List     STOP taking these medications    Breztri Aerosphere 160-9-4.8 MCG/ACT Aero Generic drug: Budeson-Glycopyrrol-Formoterol   naloxone 4 MG/0.1ML Liqd nasal spray kit Commonly known as: NARCAN   Trelegy Ellipta 100-62.5-25 MCG/ACT Aepb Generic drug: Fluticasone-Umeclidin-Vilant       TAKE these medications    albuterol 108 (90 Base) MCG/ACT inhaler Commonly known  as: VENTOLIN HFA Inhale into the lungs every 6 (six) hours as needed for wheezing or shortness of breath. What changed: Another medication with the same name was removed. Continue taking this medication, and follow the directions you see here.   aspirin EC 81 MG tablet Take 81 mg by mouth daily. Swallow whole.   cefdinir 300 MG capsule Commonly known as:  OMNICEF Take 1 capsule (300 mg total) by mouth 2 (two) times daily for 2 days.   cholecalciferol 25 MCG (1000 UNIT) tablet Commonly known as: VITAMIN D3 Take 1,000 Units by mouth daily.   cyclobenzaprine 10 MG tablet Commonly known as: FLEXERIL Take 1 tablet (10 mg total) by mouth at bedtime.   diclofenac Sodium 1 % Gel Commonly known as: Voltaren Apply 4 g topically 4 (four) times daily. What changed: Another medication with the same name was removed. Continue taking this medication, and follow the directions you see here.   FLUoxetine 20 MG capsule Commonly known as: PROZAC Take 40 mg by mouth daily. Take (2) capsules once daily   gabapentin 300 MG capsule Commonly known as: NEURONTIN TAKE 2 CAPSULES BY MOUTH AT BEDTIME What changed: additional instructions   lidocaine 5 % Commonly known as: LIDODERM Place 1 patch onto the skin daily.   OLANZapine 5 MG tablet Commonly known as: ZYPREXA Take 5 mg by mouth daily.   oxybutynin 10 MG 24 hr tablet Commonly known as: DITROPAN-XL Take 10 mg by mouth at bedtime.   Oxycodone HCl 10 MG Tabs   pantoprazole 40 MG tablet Commonly known as: PROTONIX Take 40 mg by mouth daily.   predniSONE 10 MG tablet Commonly known as: DELTASONE Take 4 tablets (40 mg total) by mouth daily with breakfast for 3 days, THEN 2 tablets (20 mg total) daily with breakfast for 3 days, THEN 1 tablet (10 mg total) daily with breakfast for 3 days. Start taking on: June 24, 2021   Spiriva Respimat 1.25 MCG/ACT Aers Generic drug: Tiotropium Bromide Monohydrate Inhale 2 puffs into the lungs daily. What changed: Another medication with the same name was removed. Continue taking this medication, and follow the directions you see here.   sulfamethoxazole-trimethoprim 400-80 MG tablet Commonly known as: BACTRIM Take 1 tablet by mouth 3 (three) times a week.   vitamin B-12 100 MCG tablet Commonly known as: CYANOCOBALAMIN Take 100 mcg by mouth daily.    Zenpep 40000-126000 units Cpep Generic drug: Pancrelipase (Lip-Prot-Amyl) Take 2 capsules with the first bite of each meal and 1 capsule with the first bite of each snack               Discharge Care Instructions  (From admission, onward)           Start     Ordered   06/24/21 0000  Discharge wound care:       Comments: Clean leg wound, apply hydrogel, top with dry dressing   06/24/21 0950            Follow-up Information     Angus Palms A, MD Follow up in 1 week(s).   Specialty: Family Medicine Contact information: [address] [place] [address] [phone]                Discharge Exam: Ceasar Mons Weights   06/20/21 0429  Weight: 51.7 kg   General exam: Appears calm and comfortable  Respiratory system: Decreased breath sounds with a few wheezes, much improved. Respiratory effort normal. Cardiovascular system: S1 & S2 heard,  RRR. No JVD, murmurs, rubs, gallops or clicks. No pedal edema. Gastrointestinal system: Abdomen is nondistended, soft and nontender. No organomegaly or masses felt. Normal bowel sounds heard. Central nervous system: Alert and oriented. No focal neurological deficits. Extremities: Symmetric 5 x 5 power. Skin: No rashes, lesions or ulcers Psychiatry: Judgement and insight appear normal. Mood & affect appropriate.    Condition at discharge: good  The results of significant diagnostics from this hospitalization (including imaging, microbiology, ancillary and laboratory) are listed below for reference.   Imaging Studies: DG Chest Portable 1 View  Result Date: 06/20/2021 CLINICAL DATA:  Shortness of breath. EXAM: PORTABLE CHEST 1 VIEW COMPARISON:  Chest CT 02/25/2021, chest PA Lat 01/08/2021 FINDINGS: The cardiac size is normal. There is aortic atherosclerosis with stable mediastinum. No pleural effusion is seen. The lungs emphysematous with subpleural reticulation in the lower lung fields. There is increased opacity in the  peripheral right mid lung and in the right lower lung field concerning for multilobar pneumonia. The remaining lungs clear. There is osteopenia mild thoracic spondylosis. IMPRESSION: Emphysematous and chronic changes, with superimposed opacities in the peripheral right mid lung above the horizontal fissure and in the right lower lung field, most likely due to multilobar pneumonia. Follow-up study recommended to ensure clearing. Electronically Signed   By: Almira Bar M.D.   On: 06/20/2021 05:29    Microbiology: Results for orders placed or performed during the hospital encounter of 06/20/21  Resp Panel by RT-PCR (Flu A&B, Covid) Nasopharyngeal Swab     Status: None   Collection Time: 06/20/21  4:38 AM   Specimen: Nasopharyngeal Swab; Nasopharyngeal(NP) swabs in vial transport medium  Result Value Ref Range Status   SARS Coronavirus 2 by RT PCR NEGATIVE NEGATIVE Final    Comment: (NOTE) SARS-CoV-2 target nucleic acids are NOT DETECTED.  The SARS-CoV-2 RNA is generally detectable in upper respiratory specimens during the acute phase of infection. The lowest concentration of SARS-CoV-2 viral copies this assay can detect is 138 copies/mL. A negative result does not preclude SARS-Cov-2 infection and should not be used as the sole basis for treatment or other patient management decisions. A negative result may occur with  improper specimen collection/handling, submission of specimen other than nasopharyngeal swab, presence of viral mutation(s) within the areas targeted by this assay, and inadequate number of viral copies(<138 copies/mL). A negative result must be combined with clinical observations, patient history, and epidemiological information. The expected result is Negative.  Fact Sheet for Patients:  BloggerCourse.com  Fact Sheet for Healthcare Providers:  SeriousBroker.it  This test is no t yet approved or cleared by the [place] FDA and  has been authorized for detection and/or diagnosis of SARS-CoV-2 by FDA under an Emergency Use Authorization (EUA). This EUA will remain  in effect (meaning this test can be used) for the duration of the COVID-19 declaration under Section 564(b)(1) of the Act, 21 U.S.C.section 360bbb-3(b)(1), unless the authorization is terminated  or revoked sooner.       Influenza A by PCR NEGATIVE NEGATIVE Final   Influenza B by PCR NEGATIVE NEGATIVE Final    Comment: (NOTE) The Xpert Xpress SARS-CoV-2/FLU/RSV plus assay is intended as an aid in the diagnosis of influenza from Nasopharyngeal swab specimens and should not be used as a sole basis for treatment. Nasal washings and aspirates are unacceptable for Xpert Xpress SARS-CoV-2/FLU/RSV testing.  Fact Sheet for Patients: BloggerCourse.com  Fact Sheet for Healthcare Providers: SeriousBroker.it  This test is not yet approved or cleared  by the [place] and has been authorized for detection and/or diagnosis of SARS-CoV-2 by FDA under an Emergency Use Authorization (EUA). This EUA will remain in effect (meaning this test can be used) for the duration of the COVID-19 declaration under Section 564(b)(1) of the Act, 21 U.S.C. section 360bbb-3(b)(1), unless the authorization is terminated or revoked.  Performed at [facility], [address] [place], [place] [address]   Blood culture (routine x 2)     Status: None (Preliminary result)   Collection Time: 06/20/21  5:49 AM   Specimen: BLOOD  Result Value Ref Range Status   Specimen Description BLOOD BLOOD LEFT WRIST  Final   Special Requests   Final    BOTTLES DRAWN AEROBIC AND ANAEROBIC Blood Culture adequate volume   Culture   Final    NO GROWTH 4 DAYS Performed at [facility], [address], [place], [place] [address]    Report Status PENDING  Incomplete  Blood culture (routine x 2)      Status: None (Preliminary result)   Collection Time: 06/20/21  5:49 AM   Specimen: BLOOD  Result Value Ref Range Status   Specimen Description BLOOD LEFT ANTECUBITAL  Final   Special Requests   Final    BOTTLES DRAWN AEROBIC AND ANAEROBIC Blood Culture adequate volume   Culture   Final    NO GROWTH 4 DAYS Performed at [facility], [address] [place], [place] [address]    Report Status PENDING  Incomplete    Labs: CBC: Recent Labs  Lab 06/20/21 0438 06/21/21 0457  WBC 16.6* 9.6  NEUTROABS 7.9*  --   HGB 12.1 9.4*  HCT 37.3 28.5*  MCV 90.3 88.8  PLT 299 209   Basic Metabolic Panel: Recent Labs  Lab 06/20/21 0438 06/21/21 0457  NA 137 139  K 4.0 3.8  CL 105 103  CO2 22 28  GLUCOSE 165* 120*  BUN 16 9  CREATININE 1.19* 0.65  CALCIUM 9.3 8.9   Liver Function Tests: Recent Labs  Lab 06/20/21 0438  AST 25  ALT 16  ALKPHOS 135*  BILITOT 0.5  PROT 8.5*  ALBUMIN 4.2   CBG: No results for input(s): GLUCAP in the last 168 hours.  Discharge time spent: greater than 30 minutes.  Signed: Marrion Coy, MD [facility] 06/24/2021

## 2021-06-24 NOTE — TOC Progression Note (Signed)
Transition of Care (TOC) - Progression Note  ? ? ?Patient Details  ?Name: Katrina Simon ?MRN: [id number] ?Date of Birth: Nov 10, 1950 ? ?Transition of Care (TOC) CM/SW Contact  ?Pete Pelt, RN ?Phone Number: ?06/24/2021, 10:39 AM ? ?Clinical Narrative:   Patient has home health set up with [facility], Pt has no equipment recommendations.  Patient does not need home oxygen per care team.  Discharge home today. ? ? ? ?Expected Discharge Plan: [place] ?Barriers to Discharge: Continued Medical Work up ? ?Expected Discharge Plan and Services ?Expected Discharge Plan: [place] ?  ?Discharge Planning Services: CM Consult ?Post Acute Care Choice: Home Health ?Living arrangements for the past 2 months: [place] ?Expected Discharge Date: 06/24/21               ?DME Arranged: N/A ?DME Agency: NA ?  ?  ?  ?HH Arranged: RN, PT, OT ?[place] Agency: [place] ?Date HH Agency Contacted: 06/20/21 ?Time [place]: [phone] ?Representative spoke with at [place]: Tommi Rumps ? ? ?Social Determinants of Health (SDOH) Interventions ?  ? ?Readmission Risk Interventions ?   ? View : No data to display.  ?  ?  ?  ? ? ?

## 2021-06-24 NOTE — Progress Notes (Signed)
SATURATION QUALIFICATIONS: (This note is used to comply with regulatory documentation for home oxygen) ? ?Patient Saturations on Room Air at Rest = 96% ? ?Patient Saturations on Room Air while Ambulating = 94% ? ?Patient Saturations on 0 Liters of oxygen while Ambulating = 94% ? ?Please briefly explain why patient needs home oxygen: ?

## 2021-06-24 NOTE — Care Management Important Message (Signed)
Important Message ? ?Patient Details  ?Name: Katrina Simon ?MRN: [id number] ?Date of Birth: 1950/07/09 ? ? ?Medicare Important Message Given:  Yes ? ? ? ? ?Tawania A Libertie Hausler ?06/24/2021, 10:51 AM ?

## 2021-06-24 NOTE — Assessment & Plan Note (Signed)
?  Chronic leg wound, follow with PCP.  ?

## 2021-06-24 NOTE — Progress Notes (Signed)
Physical Therapy Treatment ?Patient Details ?Name: Katrina Simon ?MRN: [id number] ?DOB: 1950-11-04 ?Today's Date: 06/24/2021 ? ? ?History of Present Illness MERTHA CLYATT is a 71 y.o. female with medical history significant of COPD, former smoker, GERD, depression with anxiety, fibromyalgia, chronic pain syndrome, kidney stone, CKD-3A, who presents with shortness breath. ? ?  ?PT Comments  ? ? Pt received in Semi-Fowler's position and agreeable to therapy.  Pt performed bed mobility with good and safe technique.  Pt then ambulated around the nursing station x2, with transition from using HHA to utilizing no AD.  Pt does have minimal instability at times, but is able to self-correct without significant LOB.  CGA was utilized for safety throughout.  Pt also requires verbal cuing for foot clearance.  Pt then transferred back to room in bed with all needs met.  Current discharge plans to home with HHPT remain appropriate at this time.  Pt will continue to benefit from skilled therapy in order to address deficits listed below. ? ?   ?Recommendations for follow up therapy are one component of a multi-disciplinary discharge planning process, led by the attending physician.  Recommendations may be updated based on patient status, additional functional criteria and insurance authorization. ? ?Follow Up Recommendations ? Home health PT ?  ?  ?Assistance Recommended at Discharge PRN  ?Patient can return home with the following A little help with walking and/or transfers;A little help with bathing/dressing/bathroom ?  ?Equipment Recommendations ? None recommended by PT  ?  ?Recommendations for Other Services   ? ? ?  ?Precautions / Restrictions Precautions ?Precautions: None ?Restrictions ?Weight Bearing Restrictions: No  ?  ? ?Mobility ? Bed Mobility ?Overal bed mobility: Modified Independent ?  ?  ?  ?  ?  ?  ?General bed mobility comments: increased time, HOB upright. ?  ? ?Transfers ?Overall transfer level: Needs  assistance ?Equipment used: Rolling walker (2 wheels) ?Transfers: Sit to/from Stand ?Sit to Stand: Min guard ?  ?  ?  ?  ?  ?  ?  ? ?Ambulation/Gait ?Ambulation/Gait assistance: Min guard ?Gait Distance (Feet): 320 Feet ?Assistive device: 1 person hand held assist, None ?Gait Pattern/deviations: Step-through pattern, Decreased stride length ?Gait velocity: decreased ?  ?  ?General Gait Details: good steady gait, 1 HHA at first, progressing to no AD. ? ? ?Stairs ?  ?  ?  ?  ?  ? ? ?Wheelchair Mobility ?  ? ?Modified Rankin (Stroke Patients Only) ?  ? ? ?  ?Balance Overall balance assessment: Mild deficits observed, not formally tested ?  ?  ?  ?  ?  ?  ?  ?  ?  ?  ?  ?  ?  ?  ?  ?  ?  ?  ?  ? ?  ?Cognition Arousal/Alertness: Awake/alert ?Behavior During Therapy: [facility] for tasks assessed/performed ?Overall Cognitive Status: Within Functional Limits for tasks assessed ?  ?  ?  ?  ?  ?  ?  ?  ?  ?  ?  ?  ?  ?  ?  ?  ?  ?  ?  ? ?  ?Exercises   ? ?  ?General Comments   ?  ?  ? ?Pertinent Vitals/Pain Pain Assessment ?Pain Assessment: No/denies pain  ? ? ?Home Living   ?  ?  ?  ?  ?  ?  ?  ?  ?  ?   ?  ?Prior Function    ?  ?  ?   ? ?  PT Goals (current goals can now be found in the care plan section) Acute Rehab PT Goals ?Patient Stated Goal: to go home and be able to ride horses again ?PT Goal Formulation: With patient ?Time For Goal Achievement: 07/05/21 ?Potential to Achieve Goals: Good ?Progress towards PT goals: Progressing toward goals ? ?  ?Frequency ? ? ? Min 2X/week ? ? ? ?  ?PT Plan Current plan remains appropriate  ? ? ?Co-evaluation   ?  ?  ?  ?  ? ?  ?AM-PAC PT "6 Clicks" Mobility   ?Outcome Measure ? Help needed turning from your back to your side while in a flat bed without using bedrails?: A Little ?Help needed moving from lying on your back to sitting on the side of a flat bed without using bedrails?: A Little ?Help needed moving to and from a bed to a chair (including a wheelchair)?: A Little ?Help needed  standing up from a chair using your arms (e.g., wheelchair or bedside chair)?: A Little ?Help needed to walk in hospital room?: A Little ?Help needed climbing 3-5 steps with a railing? : A Little ?6 Click Score: 18 ? ?  ?End of Session Equipment Utilized During Treatment: Gait belt ?Activity Tolerance: Patient tolerated treatment well ?Patient left: in chair;with call bell/phone within reach;with chair alarm set ?Nurse Communication: Mobility status ?PT Visit Diagnosis: Unsteadiness on feet (R26.81);Muscle weakness (generalized) (M62.81) ?  ? ? ?Time: 7408-1448 ?PT Time Calculation (min) (ACUTE ONLY): 13 min ? ?Charges:  $Gait Training: 8-22 mins          ?          ? ?Gwenlyn Saran, PT, DPT ?06/24/21, 1:36 PM ? ? ? ?Christie Nottingham ?06/24/2021, 1:30 PM ? ?

## 2021-06-24 NOTE — Progress Notes (Signed)
Patient being discharged home. IV removed. Went over discharge instructions with patient and patients husband. Both agreed they understood and all questions were answered. Patient going home POV with husband. ?

## 2021-06-25 LAB — CULTURE, BLOOD (ROUTINE X 2)
Culture: NO GROWTH
Culture: NO GROWTH
Special Requests: ADEQUATE
Special Requests: ADEQUATE

## 2021-06-26 DIAGNOSIS — L97912 Non-pressure chronic ulcer of unspecified part of right lower leg with fat layer exposed: Secondary | ICD-10-CM | POA: Insufficient documentation

## 2021-06-26 DIAGNOSIS — S81851A Open bite, right lower leg, initial encounter: Secondary | ICD-10-CM | POA: Insufficient documentation

## 2021-06-26 DIAGNOSIS — M2012 Hallux valgus (acquired), left foot: Secondary | ICD-10-CM | POA: Insufficient documentation

## 2021-07-01 ENCOUNTER — Ambulatory Visit: Payer: Medicare PPO | Attending: Anesthesiology | Admitting: Anesthesiology

## 2021-07-01 ENCOUNTER — Encounter: Payer: Self-pay | Admitting: Anesthesiology

## 2021-07-01 DIAGNOSIS — M170 Bilateral primary osteoarthritis of knee: Secondary | ICD-10-CM

## 2021-07-01 DIAGNOSIS — M47896 Other spondylosis, lumbar region: Secondary | ICD-10-CM

## 2021-07-01 DIAGNOSIS — G894 Chronic pain syndrome: Secondary | ICD-10-CM

## 2021-07-01 DIAGNOSIS — M159 Polyosteoarthritis, unspecified: Secondary | ICD-10-CM

## 2021-07-01 DIAGNOSIS — M5136 Other intervertebral disc degeneration, lumbar region: Secondary | ICD-10-CM

## 2021-07-01 DIAGNOSIS — M51369 Other intervertebral disc degeneration, lumbar region without mention of lumbar back pain or lower extremity pain: Secondary | ICD-10-CM

## 2021-07-01 DIAGNOSIS — M5135 Other intervertebral disc degeneration, thoracolumbar region: Secondary | ICD-10-CM | POA: Diagnosis not present

## 2021-07-01 DIAGNOSIS — M47816 Spondylosis without myelopathy or radiculopathy, lumbar region: Secondary | ICD-10-CM

## 2021-07-01 DIAGNOSIS — F1190 Opioid use, unspecified, uncomplicated: Secondary | ICD-10-CM

## 2021-07-01 DIAGNOSIS — M150 Primary generalized (osteo)arthritis: Secondary | ICD-10-CM

## 2021-07-01 DIAGNOSIS — M797 Fibromyalgia: Secondary | ICD-10-CM

## 2021-07-01 MED ORDER — OXYCODONE HCL 10 MG PO TABS: 10.0000 mg | ORAL_TABLET | Freq: Three times a day (TID) | ORAL | 0 refills | Status: DC

## 2021-07-01 NOTE — Progress Notes (Signed)
Virtual Visit via Telephone Note ? ?I connected with Katrina Simon on 07/01/21 at 11:30 AM EDT by telephone and verified that I am speaking with the correct person using two identifiers. ? ?Location: ?Patient: Home ?Provider: Pain control center ?  ?I discussed the limitations, risks, security and privacy concerns of performing an evaluation and management service by telephone and the availability of in person appointments. I also discussed with the patient that there may be a patient responsible charge related to this service. The patient expressed understanding and agreed to proceed. ? ? ?History of Present Illness: ?I was able to speak with Katrina Simon via telephone as we are unable to connect for the video portion of the conference but she reports that she has been doing reasonably well.  There is no change in quality characteristic or distribution of her low back and leg pain.  She has been staying active as much as possible but has suffered upon discontinuation of her opioid medications which has been working effectively for her pain.  She is taking oxycodone 10 mg tablets 3 times a day and these were working well.  She has since changed providers and has had some insurance issues which has resulted in the temporary discontinuation of her opioid protocol.  At this point she feels that the pain has been substantial but she has responded favorably to opioid therapy in the past and would like to reinitiate this.  She has failed more conservative therapy.  Otherwise no change in the quality characteristic or distribution is noted and no change in lower extremity strength or function is noted. ? ?Review of systems: ?General: No fevers or chills ?Pulmonary: No shortness of breath or dyspnea ?Cardiac: No angina or palpitations or lightheadedness ?GI: No abdominal pain or constipation ?Psych: No depression  ?  ?Observations/Objective: ? ?Current Outpatient Medications:  ?  albuterol (PROVENTIL HFA;VENTOLIN HFA) 108  (90 Base) MCG/ACT inhaler, Inhale into the lungs every 6 (six) hours as needed for wheezing or shortness of breath., Disp: , Rfl:  ?  aspirin EC 81 MG tablet, Take 81 mg by mouth daily. Swallow whole., Disp: , Rfl:  ?  cholecalciferol (VITAMIN D3) 25 MCG (1000 UNIT) tablet, Take 1,000 Units by mouth daily., Disp: , Rfl:  ?  cyclobenzaprine (FLEXERIL) 10 MG tablet, Take 1 tablet (10 mg total) by mouth at bedtime., Disp: 30 tablet, Rfl: 3 ?  diclofenac Sodium (VOLTAREN) 1 % GEL, Apply 4 g topically 4 (four) times daily., Disp: 100 g, Rfl: 3 ?  FLUoxetine (PROZAC) 20 MG capsule, Take 40 mg by mouth daily. Take (2) capsules once daily, Disp: , Rfl:  ?  gabapentin (NEURONTIN) 300 MG capsule, TAKE 2 CAPSULES BY MOUTH AT BEDTIME (Patient taking differently: Take 600 mg by mouth at bedtime. Take (2) Capsules by mouth HS), Disp: 60 capsule, Rfl: 3 ?  lidocaine (LIDODERM) 5 %, Place 1 patch onto the skin daily., Disp: , Rfl:  ?  OLANZapine (ZYPREXA) 5 MG tablet, Take 5 mg by mouth daily., Disp: , Rfl:  ?  oxybutynin (DITROPAN-XL) 10 MG 24 hr tablet, Take 10 mg by mouth at bedtime., Disp: , Rfl:  ?  Oxycodone HCl 10 MG TABS, Take 1 tablet (10 mg total) by mouth in the morning, at noon, and at bedtime., Disp: 90 tablet, Rfl: 0 ?  Pancrelipase, Lip-Prot-Amyl, (ZENPEP) 40000-126000 units CPEP, Take 2 capsules with the first bite of each meal and 1 capsule with the first bite of each snack (Patient not  taking: Reported on 02/21/2021), Disp: 240 capsule, Rfl: 1 ?  pantoprazole (PROTONIX) 40 MG tablet, Take 40 mg by mouth daily., Disp: , Rfl:  ?  predniSONE (DELTASONE) 10 MG tablet, Take 4 tablets (40 mg total) by mouth daily with breakfast for 3 days, THEN 2 tablets (20 mg total) daily with breakfast for 3 days, THEN 1 tablet (10 mg total) daily with breakfast for 3 days., Disp: 21 tablet, Rfl: 0 ?  sulfamethoxazole-trimethoprim (BACTRIM) 400-80 MG tablet, Take 1 tablet by mouth 3 (three) times a week., Disp: , Rfl:  ?  TRELEGY  ELLIPTA 100-62.5-25 MCG/ACT AEPB, Inhale 1 puff into the lungs daily., Disp: 28 each, Rfl: 0 ?  vitamin B-12 (CYANOCOBALAMIN) 100 MCG tablet, Take 100 mcg by mouth daily., Disp: , Rfl:   ? ?Past Medical History:  ?Diagnosis Date  ? Achalasia   ? Allergy   ? Arthritis   ? Depression   ? Emphysema of lung ([place])   ? Fibromyalgia   ? Gastroesophageal reflux disease with esophagitis 01/26/2020  ? Formatting of this note might be different from the original. LA Grade C noted on EGD 01/2020  ? GERD (gastroesophageal reflux disease)   ? Herpes genitalis   ? History of chicken pox   ? History of kidney stones   ?  ? ?Assessment and Plan: ?1. DDD (degenerative disc disease), lumbar   ?2. DDD (degenerative disc disease), thoracolumbar   ?3. Primary osteoarthritis of both knees   ?4. Primary osteoarthritis involving multiple joints   ?5. Facet syndrome, lumbar   ?6. Fibromyalgia   ?7. Chronic pain syndrome   ?8. Chronic, continuous use of opioids   ?I have reviewed the [facility] practitioner database information and is appropriate for refill of her medication today.  We will initiate her therapy at oxycodone 10 mg tablets to be taken 3 times a day as needed.  No other changes are initiated today.  I have encouraged her to continue with stretching strengthening exercises.  She is to continue follow-up with her primary care physicians for baseline medical care with return to clinic in 1 month. ? ?Follow Up Instructions: ? ?  ?I discussed the assessment and treatment plan with the patient. The patient was provided an opportunity to ask questions and all were answered. The patient agreed with the plan and demonstrated an understanding of the instructions. ?  ?The patient was advised to call back or seek an in-person evaluation if the symptoms worsen or if the condition fails to improve as anticipated. ? ?I provided 30 minutes of non-face-to-face time during this encounter. ? ? ?Molli Barrows, MD Home ?

## 2021-07-03 ENCOUNTER — Ambulatory Visit: Payer: Medicare Other | Admitting: Gastroenterology

## 2021-07-09 ENCOUNTER — Ambulatory Visit: Payer: Medicare PPO | Admitting: Gastroenterology

## 2021-07-09 ENCOUNTER — Other Ambulatory Visit: Payer: Self-pay

## 2021-07-09 ENCOUNTER — Encounter: Payer: Self-pay | Admitting: Gastroenterology

## 2021-07-09 VITALS — BP 111/72 | HR 73 | Temp 97.7°F | Ht 64.0 in | Wt 113.2 lb

## 2021-07-09 DIAGNOSIS — D509 Iron deficiency anemia, unspecified: Secondary | ICD-10-CM

## 2021-07-09 NOTE — Progress Notes (Signed)
?  ?Cephas Darby, MD ?[address]  ?[address]  ?[place], [place] [address]  ?Main: [phone]  ?Fax: [phone] ? ? ? ?Gastroenterology Consultation ? ?Referring Provider:     [name], MD ?Primary Care Physician:  [name], MD ?Primary Gastroenterologist:  Dr. Cephas Darby ?Reason for Consultation: Iron deficiency anemia      ? HPI:   ?Katrina Simon is a 71 y.o. female referred by Dr. Iona Beard, Rubbie Battiest, MD  for consultation & management of unintentional weight loss, dysphagia.  Patient reports that she has been having difficulty swallowing and she underwent upper endoscopy in 04/2020 at [facility], reported to have Barrett's esophagus, biopsies were performed.  Pathology results are not available with me.  There was no evidence of stricture.  Patient does have history of iron deficiency anemia.  Her B12 and folate levels were normal.  Patient is accompanied by her husband today and he is concerned about recent history of significant weight loss.  She lost about 65 pounds within last several months.  He reports that her appetite has improved and she regained few pounds.  Patient denies any abdominal pain.  However, she does report intermittent loose stools.  She denies any rectal bleeding.  She does report abdominal bloating.  Patient does report spontaneous bruising. ? ?Follow-up visit 01/01/2021 ?Patient underwent colonoscopy which was unremarkable including biopsies.  Patient's son who accompanied her today, reported that he turned in the stool specimen, apparently the test was canceled.  Patient reports that she has been eating more and gained 4 pounds.  She denies any dysphagia.  Patient did not tolerate Creon samples, felt worsening of abdominal pain.  She is no longer experiencing diarrhea, her bowel movements are up to 3 times a week, describes on Bristol stool scale as 3 ? ?Follow-up visit 07/09/2021 ?Patient was admitted to [facility] secondary to acute respiratory failure from pneumonia in  early April 2023.  She was treated with ceftriaxone and azithromycin.  Patient is now currently being treated for shingles, went to urgent care on 4/23, started on valacyclovir.  She is also found to have recurrence of anemia hemoglobin 9.4 on 06/21/2021, MCV normal, normal platelets, BMP normal.  Patient is taking oral iron 1 pill daily.  She reports that since she was released from the hospital, she is not having bowel movements regularly.  Her weight has been stable and her appetite has been okay.  She denies any fever, chills, nausea or vomiting.  Patient could not tolerate Zenpep. ? ?NSAIDs: None ? ?Antiplts/Anticoagulants/Anti thrombotics: None ? ?GI Procedures:  ?Colonoscopy 10/04/2020 ?Sigmoid diverticulosis, normal terminal ileum, normal colon ?Random colon biopsies were unremarkable ? ?Upper endoscopy 05/09/2020 ? ? ? ?Upper endoscopy 11/19/2016 ?There were esophageal mucosal changes suspicious for short-segment Barrett's esophagus present in the distal esophagus. The maximum longitudinal extent of these mucosal changes was 2 cm in length. Mucosa was biopsied with a cold forceps for histology. ?One specimen bottle was sent to pathology. ?DIAGNOSIS:  ?A.  DISTAL ESOPHAGUS; COLD BIOPSY:  ?- GASTRIC TYPE MUCOSA WITH MINIMAL CHRONIC INFLAMMATION.  ?- NEGATIVE FOR SQUAMOUS MUCOSA, GOBLET CELLS, DYSPLASIA AND MALIGNANCY. ? ?Colonoscopy 05/10/2013 ?Diagnosis:  ?Part A: ASCENDING COLON POLYP COLD BIOPSY:  ?- TUBULAR ADENOMA, 1 OF 3 FRAGMENTS.  ?- NEGATIVE FOR HIGH GRADE DYSPLASIA AND MALIGNANCY.  ?.  ?Part B: SIGMOID COLON POLYP HOT SNARE:  ?- HYPERPLASTIC POLYP.  ?- NEGATIVE FOR DYSPLASIA AND MALIGNANCY.  ?.  ?Part C: SIGMOID COLON POLYP COLD  BIOPSY:  ?- HYPERPLASTIC POLYP.  ?- NEGATIVE FOR DYSPLASIA AND MALIGNANCY.  ?.  ?Part D: RECTOSIGMOID COLON POLYP COLD BIOPSY:  ?- HYPERPLASTIC POLYP, 2 FRAGMENTS.  ?- NEGATIVE FOR DYSPLASIA AND MALIGNANCY.  ? ?Past Medical History:  ?Diagnosis Date  ? Achalasia   ? Allergy   ?  Arthritis   ? Depression   ? Emphysema of lung ([place])   ? Fibromyalgia   ? Gastroesophageal reflux disease with esophagitis 01/26/2020  ? Formatting of this note might be different from the original. LA Grade C noted on EGD 01/2020  ? GERD (gastroesophageal reflux disease)   ? Herpes genitalis   ? History of chicken pox   ? History of kidney stones   ? ? ?Past Surgical History:  ?Procedure Laterality Date  ? CESAREAN SECTION    ? COLONOSCOPY WITH PROPOFOL N/A 10/03/2020  ? Procedure: COLONOSCOPY WITH PROPOFOL;  Surgeon: Lin Landsman, MD;  Location[facility] ENDOSCOPY;  Service: Gastroenterology;  Laterality: N/A;  ? COLONOSCOPY WITH PROPOFOL N/A 10/04/2020  ? Procedure: COLONOSCOPY WITH PROPOFOL;  Surgeon: Lin Landsman, MD;  Location: [facility];  Service: Endoscopy;  Laterality: N/A;  ? ESOPHAGOGASTRODUODENOSCOPY (EGD) WITH PROPOFOL N/A 11/19/2016  ? Procedure: ESOPHAGOGASTRODUODENOSCOPY (EGD) WITH PROPOFOL;  Surgeon: Manya Silvas, MD;  Location: [facility] ENDOSCOPY;  Service: Endoscopy;  Laterality: N/A;  ? LAPAROSCOPY N/A   ? done at [facility] to release esophageal muscles  ? ? ?Current Outpatient Medications:  ?  albuterol (PROVENTIL HFA;VENTOLIN HFA) 108 (90 Base) MCG/ACT inhaler, Inhale into the lungs every 6 (six) hours as needed for wheezing or shortness of breath., Disp: , Rfl:  ?  aspirin EC 81 MG tablet, Take 81 mg by mouth daily. Swallow whole., Disp: , Rfl:  ?  cholecalciferol (VITAMIN D3) 25 MCG (1000 UNIT) tablet, Take 1,000 Units by mouth daily., Disp: , Rfl:  ?  cyclobenzaprine (FLEXERIL) 10 MG tablet, Take by mouth., Disp: , Rfl:  ?  FLUoxetine (PROZAC) 20 MG capsule, Take 40 mg by mouth daily. Take (2) capsules once daily, Disp: , Rfl:  ?  gabapentin (NEURONTIN) 300 MG capsule, TAKE 2 CAPSULES BY MOUTH AT BEDTIME (Patient taking differently: Take 600 mg by mouth at bedtime. Take (2) Capsules by mouth HS), Disp: 60 capsule, Rfl: 3 ?  lidocaine (LIDODERM) 5 %, Place 1 patch  onto the skin daily., Disp: , Rfl:  ?  naloxone (NARCAN) nasal spray 4 mg/0.1 mL, Place into the nose., Disp: , Rfl:  ?  OLANZapine (ZYPREXA) 5 MG tablet, Take 5 mg by mouth daily., Disp: , Rfl:  ?  oxybutynin (DITROPAN-XL) 10 MG 24 hr tablet, Take 10 mg by mouth at bedtime., Disp: , Rfl:  ?  Oxycodone HCl 10 MG TABS, Take 1 tablet (10 mg total) by mouth in the morning, at noon, and at bedtime., Disp: 90 tablet, Rfl: 0 ?  pantoprazole (PROTONIX) 40 MG tablet, Take 40 mg by mouth daily., Disp: , Rfl:  ?  sulfamethoxazole-trimethoprim (BACTRIM) 400-80 MG tablet, Take 1 tablet by mouth 3 (three) times a week., Disp: , Rfl:  ?  TRELEGY ELLIPTA 100-62.5-25 MCG/ACT AEPB, Inhale 1 puff into the lungs daily., Disp: 28 each, Rfl: 0 ?  vitamin B-12 (CYANOCOBALAMIN) 100 MCG tablet, Take 100 mcg by mouth daily., Disp: , Rfl:  ?  diclofenac Sodium (VOLTAREN) 1 % GEL, Apply 4 g topically 4 (four) times daily. (Patient not taking: Reported on 07/09/2021), Disp: 100 g, Rfl: 3 ?  SPIRIVA HANDIHALER 18  MCG inhalation capsule, 1 capsule daily. (Patient not taking: Reported on 07/09/2021), Disp: , Rfl:  ? ? ? ?Family History  ?Problem Relation Age of Onset  ? Arthritis Mother   ? Heart disease Father   ? Stroke Father   ? Breast cancer Neg Hx   ?  ? ?Social History  ? ?Tobacco Use  ? Smoking status: Former  ?  Packs/day: 0.50  ?  Years: 45.00  ?  Pack years: 22.50  ?  Types: Cigarettes  ?  Quit date: 09/16/2012  ?  Years since quitting: 8.8  ? Smokeless tobacco: Never  ?Vaping Use  ? Vaping Use: Former  ?Substance Use Topics  ? Alcohol use: No  ?  Alcohol/week: 0.0 standard drinks  ? Drug use: No  ? ? ?Allergies as of 07/09/2021 - Review Complete 07/09/2021  ?Allergen Reaction Noted  ? Penicillins Hives 07/18/2014  ? Aspirin Nausea And Vomiting 07/18/2014  ? Statins Other (See Comments) 07/18/2014  ? ? ?Review of Systems:    ?All systems reviewed and negative except where noted in HPI. ? ? Physical Exam:  ?BP 111/72 (BP Location: Left Arm,  Patient Position: Sitting, Cuff Size: Normal)   Pulse 73   Temp 97.7 ?F (36.5 ?C) (Oral)   Ht '5\' 4"'$  (1.626 m)   Wt 113 lb 4 oz (51.4 kg)   LMP  (LMP Unknown)   BMI 19.44 kg/m?  ?No LMP recorded (lmp unk

## 2021-07-10 LAB — CBC
Hematocrit: 32.7 % — ABNORMAL LOW (ref 34.0–46.6)
Hemoglobin: 11 g/dL — ABNORMAL LOW (ref 11.1–15.9)
MCH: 29.9 pg (ref 26.6–33.0)
MCHC: 33.6 g/dL (ref 31.5–35.7)
MCV: 89 fL (ref 79–97)
Platelets: 283 10*3/uL (ref 150–450)
RBC: 3.68 x10E6/uL — ABNORMAL LOW (ref 3.77–5.28)
RDW: 14.2 % (ref 11.7–15.4)
WBC: 7.1 10*3/uL (ref 3.4–10.8)

## 2021-07-10 LAB — IRON,TIBC AND FERRITIN PANEL
Ferritin: 154 ng/mL — ABNORMAL HIGH (ref 15–150)
Iron Saturation: 16 % (ref 15–55)
Iron: 43 ug/dL (ref 27–139)
Total Iron Binding Capacity: 272 ug/dL (ref 250–450)
UIBC: 229 ug/dL (ref 118–369)

## 2021-07-10 LAB — B12 AND FOLATE PANEL
Folate: 14.3 ng/mL (ref >3.0)
Vitamin B-12: 1962 pg/mL — ABNORMAL HIGH (ref 232–1245)

## 2021-07-29 ENCOUNTER — Encounter
Admission: RE | Disposition: A | Discharge status: Home / Self Care | Payer: Self-pay | Source: Ambulatory Visit | Attending: Gastroenterology

## 2021-07-29 ENCOUNTER — Ambulatory Visit
Admission: RE | Admit: 2021-07-29 | Discharge: 2021-07-29 | Disposition: A | Discharge status: Home / Self Care | Payer: Medicare PPO | Source: Ambulatory Visit | Attending: Gastroenterology | Admitting: Gastroenterology

## 2021-07-29 ENCOUNTER — Encounter: Payer: Self-pay | Admitting: Gastroenterology

## 2021-07-29 ENCOUNTER — Ambulatory Visit: Payer: Medicare PPO | Admitting: Anesthesiology

## 2021-07-29 DIAGNOSIS — D509 Iron deficiency anemia, unspecified: Secondary | ICD-10-CM | POA: Diagnosis present

## 2021-07-29 DIAGNOSIS — K219 Gastro-esophageal reflux disease without esophagitis: Secondary | ICD-10-CM | POA: Diagnosis not present

## 2021-07-29 DIAGNOSIS — R634 Abnormal weight loss: Secondary | ICD-10-CM | POA: Insufficient documentation

## 2021-07-29 DIAGNOSIS — R630 Anorexia: Secondary | ICD-10-CM | POA: Insufficient documentation

## 2021-07-29 DIAGNOSIS — K449 Diaphragmatic hernia without obstruction or gangrene: Secondary | ICD-10-CM | POA: Insufficient documentation

## 2021-07-29 DIAGNOSIS — J439 Emphysema, unspecified: Secondary | ICD-10-CM | POA: Insufficient documentation

## 2021-07-29 DIAGNOSIS — Z87891 Personal history of nicotine dependence: Secondary | ICD-10-CM | POA: Insufficient documentation

## 2021-07-29 DIAGNOSIS — F32A Depression, unspecified: Secondary | ICD-10-CM | POA: Diagnosis not present

## 2021-07-29 DIAGNOSIS — Z681 Body mass index (BMI) 19 or less, adult: Secondary | ICD-10-CM | POA: Insufficient documentation

## 2021-07-29 HISTORY — PX: ESOPHAGOGASTRODUODENOSCOPY (EGD) WITH PROPOFOL: SHX5813

## 2021-07-29 SURGERY — ESOPHAGOGASTRODUODENOSCOPY (EGD) WITH PROPOFOL
Anesthesia: General

## 2021-07-29 MED ORDER — LIDOCAINE HCL (CARDIAC) PF 100 MG/5ML IV SOSY
PREFILLED_SYRINGE | INTRAVENOUS | Status: DC | PRN
  Administered 2021-07-29: 50 mg via INTRAVENOUS

## 2021-07-29 MED ORDER — PROPOFOL 10 MG/ML IV BOLUS
INTRAVENOUS | Status: DC | PRN
  Administered 2021-07-29 (×2): 20 mg via INTRAVENOUS
  Administered 2021-07-29: 10 mg via INTRAVENOUS
  Administered 2021-07-29: 70 mg via INTRAVENOUS

## 2021-07-29 MED ORDER — SODIUM CHLORIDE 0.9 % IV SOLN: INTRAVENOUS | Status: DC

## 2021-07-29 NOTE — Transfer of Care (Signed)
Immediate Anesthesia Transfer of Care Note ? ?Patient: Katrina Simon ? ?Procedure(s) Performed: ESOPHAGOGASTRODUODENOSCOPY (EGD) WITH PROPOFOL ? ?Patient Location: PACU and Endoscopy Unit ? ?Anesthesia Type:General ? ?Level of Consciousness: drowsy and patient cooperative ? ?Airway & Oxygen Therapy: Patient Spontanous Breathing ? ?Post-op Assessment: Report given to RN and Post -op Vital signs reviewed and stable ? ?Post vital signs: Reviewed and stable ? ?Last Vitals:  ?Vitals Value Taken Time  ?BP 127/72 07/29/21 0858  ?Temp 36.4 ?C 07/29/21 0857  ?Pulse 63 07/29/21 0858  ?Resp 14 07/29/21 0858  ?SpO2 100 % 07/29/21 0858  ?Vitals shown include unvalidated device data. ? ?Last Pain:  ?Vitals:  ? 07/29/21 0857  ?TempSrc: Temporal  ?PainSc:   ?   ? ?  ? ?Complications: No notable events documented. ?

## 2021-07-29 NOTE — Anesthesia Preprocedure Evaluation (Signed)
Anesthesia Evaluation  ?Patient identified by MRN, date of birth, ID band ?Patient awake ? ? ? ?Reviewed: ?Allergy & Precautions, NPO status , Patient's Chart, lab work & pertinent test results ? ?History of Anesthesia Complications ?Negative for: history of anesthetic complications ? ?Airway ?Mallampati: II ? ?TM Distance: >3 FB ?Neck ROM: full ? ? ? Dental ? ?(+) Upper Dentures, Lower Dentures, Dental Advidsory Given ?  ?Pulmonary ?shortness of breath and with exertion, COPD,  COPD inhaler, neg recent URI, Patient abstained from smoking., former smoker,  ?  ?Pulmonary exam normal ? ? ? ? ? ? ? Cardiovascular ?Exercise Tolerance: Good ?(-) anginanegative cardio ROS ?Normal cardiovascular exam ? ? ?  ?Neuro/Psych ?neg Seizures PSYCHIATRIC DISORDERS Depression  Neuromuscular disease negative psych ROS  ? GI/Hepatic ?Neg liver ROS, GERD  Medicated and Controlled,  ?Endo/Other  ?negative endocrine ROS ? Renal/GU ?Renal disease  ?negative genitourinary ?  ?Musculoskeletal ? ?(+) Arthritis , Osteoarthritis,  Fibromyalgia - ? Abdominal ?Normal abdominal exam  (+)   ?Peds ? Hematology ?negative hematology ROS ?(+)   ?Anesthesia Other Findings ?Past Medical History: ?No date: Achalasia ?No date: Allergy ?No date: Arthritis ?No date: Depression ?No date: Emphysema of lung ([place]) ?No date: Fibromyalgia ?No date: GERD (gastroesophageal reflux disease) ?No date: Herpes genitalis ?No date: History of chicken pox ?No date: History of kidney stones ? ?Past Surgical History: ?No date: CESAREAN SECTION ?11/19/2016: ESOPHAGOGASTRODUODENOSCOPY (EGD) WITH PROPOFOL; N/A ?    Comment:  Procedure: ESOPHAGOGASTRODUODENOSCOPY (EGD) WITH  ?             PROPOFOL;  Surgeon: Manya Silvas, MD;  Location:  ?             [place] ENDOSCOPY;  Service: Endoscopy;  Laterality: N/A; ?No date: LAPAROSCOPY; N/A ?    Comment:  done at [facility] to release esophageal muscles ? ?BMI   ? Body Mass Index: 19.01 kg/m?  ?   ? ? Reproductive/Obstetrics ?negative OB ROS ? ?  ? ? ? ? ? ? ? ? ? ? ? ? ? ?  ?  ? ? ? ? ? ? ? ? ?Anesthesia Physical ? ?Anesthesia Plan ? ?ASA: 3 ? ?Anesthesia Plan: General  ? ?Post-op Pain Management:   ? ?Induction: Intravenous ? ?PONV Risk Score and Plan: Propofol infusion and TIVA ? ?Airway Management Planned: Nasal Cannula ? ?Additional Equipment:  ? ?Intra-op Plan:  ? ?Post-operative Plan:  ? ?Informed Consent: I have reviewed the patients History and Physical, chart, labs and discussed the procedure including the risks, benefits and alternatives for the proposed anesthesia with the patient or authorized representative who has indicated his/her understanding and acceptance.  ? ? ? ?Dental advisory given ? ?Plan Discussed with: CRNA and Surgeon ? ?Anesthesia Plan Comments: (Patient consented for risks of anesthesia including but not limited to:  ?- adverse reactions to medications ?- risk of airway placement if required ?- damage to eyes, teeth, lips or other oral mucosa ?- nerve damage due to positioning  ?- sore throat or hoarseness ?- Damage to heart, brain, nerves, lungs, other parts of body or loss of life ? ?Patient voiced understanding.)  ? ? ? ? ? ? ?Anesthesia Quick Evaluation ? ?

## 2021-07-29 NOTE — H&P (Signed)
?Katrina Darby, MD ?[address]  ?[address]  ?[place], [place] [address]  ?Main: [phone]  ?Fax: [phone] ?Pager: [phone] ? ?Primary Care Physician:  Katrina Peach, MD ?Primary Gastroenterologist:  Dr. Cephas Simon ? ?Pre-Procedure History & Physical: ?HPI:  Katrina Simon is a 71 y.o. female is here for an endoscopy. ?  ?Past Medical History:  ?Diagnosis Date  ? Achalasia   ? Allergy   ? Arthritis   ? Depression   ? Emphysema of lung ([place])   ? Fibromyalgia   ? Gastroesophageal reflux disease with esophagitis 01/26/2020  ? Formatting of this note might be different from the original. LA Grade C noted on EGD 01/2020  ? GERD (gastroesophageal reflux disease)   ? Herpes genitalis   ? History of chicken pox   ? History of kidney stones   ? ? ?Past Surgical History:  ?Procedure Laterality Date  ? CESAREAN SECTION    ? COLONOSCOPY WITH PROPOFOL N/A 10/03/2020  ? Procedure: COLONOSCOPY WITH PROPOFOL;  Surgeon: Katrina Landsman, MD;  Location: [facility] ENDOSCOPY;  Service: Gastroenterology;  Laterality: N/A;  ? COLONOSCOPY WITH PROPOFOL N/A 10/04/2020  ? Procedure: COLONOSCOPY WITH PROPOFOL;  Surgeon: Katrina Landsman, MD;  Location: [place];  Service: Endoscopy;  Laterality: N/A;  ? ESOPHAGOGASTRODUODENOSCOPY (EGD) WITH PROPOFOL N/A 11/19/2016  ? Procedure: ESOPHAGOGASTRODUODENOSCOPY (EGD) WITH PROPOFOL;  Surgeon: Katrina Silvas, MD;  Location: [facility] ENDOSCOPY;  Service: Endoscopy;  Laterality: N/A;  ? LAPAROSCOPY N/A   ? done at [facility] to release esophageal muscles  ? ? ?Prior to Admission medications   ?Medication Sig Start Date End Date Taking? Authorizing Provider  ?pantoprazole (PROTONIX) 40 MG tablet Take 40 mg by mouth daily. 08/01/20  Yes [provider]  ?SPIRIVA HANDIHALER 18 MCG inhalation capsule 1 capsule daily. 07/03/21  Yes [provider]  ?albuterol (PROVENTIL HFA;VENTOLIN HFA) 108 (90 Base) MCG/ACT inhaler Inhale into the lungs every 6  (six) hours as needed for wheezing or shortness of breath.    [provider]  ?aspirin EC 81 MG tablet Take 81 mg by mouth daily. Swallow whole.    [provider]  ?cholecalciferol (VITAMIN D3) 25 MCG (1000 UNIT) tablet Take 1,000 Units by mouth daily.    [provider]  ?cyclobenzaprine (FLEXERIL) 10 MG tablet Take by mouth.    [provider]  ?diclofenac Sodium (VOLTAREN) 1 % GEL Apply 4 g topically 4 (four) times daily. ?Patient not taking: Reported on 07/09/2021 12/14/19   Katrina Simon, DPM  ?FLUoxetine (PROZAC) 20 MG capsule Take 40 mg by mouth daily. Take (2) capsules once daily 04/30/21   [provider]  ?gabapentin (NEURONTIN) 300 MG capsule TAKE 2 CAPSULES BY MOUTH AT BEDTIME ?Patient taking differently: Take 600 mg by mouth at bedtime. Take (2) Capsules by mouth HS 01/30/21   Simon, Katrina Simon, DPM  ?lidocaine (LIDODERM) 5 % Place 1 patch onto the skin daily. 05/26/21   [provider]  ?naloxone (NARCAN) nasal spray 4 mg/0.1 mL Place into the nose. 08/21/20   [provider]  ?OLANZapine (ZYPREXA) 5 MG tablet Take 5 mg by mouth daily. 12/24/18   [provider]  ?oxybutynin (DITROPAN-XL) 10 MG 24 hr tablet Take 10 mg by mouth at bedtime.    [provider]  ?Oxycodone HCl 10 MG TABS Take 1 tablet (10 mg total) by mouth in the morning, at noon, and at bedtime. 07/01/21 07/31/21  Molli Barrows, MD  ?  sulfamethoxazole-trimethoprim (BACTRIM) 400-80 MG tablet Take 1 tablet by mouth 3 (three) times a week.    [provider]  ?Katrina Simon 100-62.5-25 MCG/ACT AEPB Inhale 1 puff into the lungs daily. 06/24/21   Katrina Hones, MD  ?vitamin B-12 (CYANOCOBALAMIN) 100 MCG tablet Take 100 mcg by mouth daily.    [provider]  ? ? ?Allergies as of 07/09/2021 - Review Complete 07/09/2021  ?Allergen Reaction Noted  ? Penicillins Hives 07/18/2014  ? Aspirin Nausea And Vomiting 07/18/2014  ? Statins Other (See Comments) 07/18/2014   ? ? ?Family History  ?Problem Relation Age of Onset  ? Arthritis Mother   ? Heart disease Father   ? Stroke Father   ? Breast cancer Neg Hx   ? ? ?Social History  ? ?Socioeconomic History  ? Marital status: Married  ?  Spouse name: Not on file  ? Number of children: Not on file  ? Years of education: Not on file  ? Highest education level: Not on file  ?Occupational History  ? Not on file  ?Tobacco Use  ? Smoking status: Former  ?  Packs/day: 0.50  ?  Years: 45.00  ?  Pack years: 22.50  ?  Types: Cigarettes  ?  Quit date: 09/16/2012  ?  Years since quitting: 8.8  ? Smokeless tobacco: Never  ?Vaping Use  ? Vaping Use: Former  ?Substance and Sexual Activity  ? Alcohol use: No  ?  Alcohol/week: 0.0 standard drinks  ? Drug use: No  ? Sexual activity: Yes  ?  Birth control/protection: Post-menopausal  ?Other Topics Concern  ? Not on file  ?Social History Narrative  ? Not on file  ? ?Social Determinants of Health  ? ?Financial Resource Strain: Not on file  ?Food Insecurity: Not on file  ?Transportation Needs: Not on file  ?Physical Activity: Not on file  ?Stress: Not on file  ?Social Connections: Not on file  ?Intimate Partner Violence: Not on file  ? ? ?Review of Systems: ?See HPI, otherwise negative ROS ? ?Physical Exam: ?BP (!) 153/78   Pulse 67   Temp 97.6 ?F (36.4 ?C)   Resp 20   Ht '5\' 4"'$  (1.626 m)   Wt 50.3 kg   LMP  (LMP Unknown)   SpO2 100%   BMI 19.05 kg/m?  ?General:   Alert,  pleasant and cooperative in NAD ?Head:  Normocephalic and atraumatic. ?Neck:  Supple; no masses or thyromegaly. ?Lungs:  Clear throughout to auscultation.    ?Heart:  Regular rate and rhythm. ?Abdomen:  Soft, nontender and nondistended. Normal bowel sounds, without guarding, and without rebound.   ?Neurologic:  Alert and  oriented x4;  grossly normal neurologically. ? ?Impression/Plan: ?Katrina Simon is here for an endoscopy to be performed for weight loss, IDA ? ?Risks, benefits, limitations, and alternatives regarding  endoscopy  have been reviewed with the patient.  Questions have been answered.  All parties agreeable. ? ? ?Sherri Sear, MD  07/29/2021, 8:22 AM ?

## 2021-07-29 NOTE — Op Note (Signed)
[facility] ?Gastroenterology ?Patient Name: Katrina Simon ?Procedure Date: 07/29/2021 8:24 AM ?MRN: [id number] ?Account #: [id number] ?Date of Birth: 29-Dec-1950 ?Admit Type: Outpatient ?Age: 71 ?Room: [facility] ENDO ROOM 1 ?Gender: Female ?Note Status: Finalized ?Instrument Name: Upper Endoscope [id number] ?Procedure:             Upper GI endoscopy ?Indications:           Unexplained iron deficiency anemia, Anorexia, Weight  ?                       loss ?Providers:             Lin Landsman MD, MD ?Referring MD:          Rubbie Battiest. Iona Beard MD, MD (Referring MD) ?Medicines:             General Anesthesia ?Complications:         No immediate complications. Estimated blood loss: None. ?Procedure:             Pre-Anesthesia Assessment: ?                       - Prior to the procedure, a History and Physical was  ?                       performed, and patient medications and allergies were  ?                       reviewed. The patient is competent. The risks and  ?                       benefits of the procedure and the sedation options and  ?                       risks were discussed with the patient. All questions  ?                       were answered and informed consent was obtained.  ?                       Patient identification and proposed procedure were  ?                       verified by the physician, the nurse, the  ?                       anesthesiologist, the anesthetist and the technician  ?                       in the pre-procedure area in the procedure room in the  ?                       endoscopy suite. Mental Status Examination: alert and  ?                       oriented. Airway Examination: normal oropharyngeal  ?                       airway and neck mobility. Respiratory Examination:  ?  clear to auscultation. CV Examination: normal.  ?                       Prophylactic Antibiotics: The patient does not require  ?                       prophylactic  antibiotics. Prior Anticoagulants: The  ?                       patient has taken no previous anticoagulant or  ?                       antiplatelet agents. ASA Grade Assessment: III - A  ?                       patient with severe systemic disease. After reviewing  ?                       the risks and benefits, the patient was deemed in  ?                       satisfactory condition to undergo the procedure. The  ?                       anesthesia plan was to use general anesthesia.  ?                       Immediately prior to administration of medications,  ?                       the patient was re-assessed for adequacy to receive  ?                       sedatives. The heart rate, respiratory rate, oxygen  ?                       saturations, blood pressure, adequacy of pulmonary  ?                       ventilation, and response to care were monitored  ?                       throughout the procedure. The physical status of the  ?                       patient was re-assessed after the procedure. ?                       After obtaining informed consent, the endoscope was  ?                       passed under direct vision. Throughout the procedure,  ?                       the patient's blood pressure, pulse, and oxygen  ?                       saturations were monitored continuously. The Endoscope  ?  was introduced through the mouth, and advanced to the  ?                       second part of duodenum. The upper GI endoscopy was  ?                       accomplished without difficulty. The patient tolerated  ?                       the procedure well. ?Findings: ?     The duodenal bulb and second portion of the duodenum were normal.  ?     Biopsies were taken with a cold forceps for histology. ?     A medium-sized hiatal hernia was present. ?     The entire examined stomach was normal. Biopsies were taken with a cold  ?     forceps for histology. ?     Esophagogastric landmarks were  identified: the gastroesophageal junction  ?     was found at 35 cm from the incisors. ?     The gastroesophageal junction and examined esophagus were normal. ?Impression:            - Normal duodenal bulb and second portion of the  ?                       duodenum. Biopsied. ?                       - Medium-sized hiatal hernia. ?                       - Normal stomach. Biopsied. ?                       - Esophagogastric landmarks identified. ?                       - Normal gastroesophageal junction and esophagus. ?Recommendation:        - Await pathology results. ?                       - Discharge patient to home (with escort). ?                       - Resume previous diet today. ?                       - Continue present medications. ?                       - To visualize the small bowel, perform video capsule  ?                       endoscopy at appointment to be scheduled for IDA. ?Procedure Code(s):     --- Professional --- ?                       [phone], Esophagogastroduodenoscopy, flexible,  ?                       transoral; with biopsy, single or multiple ?Diagnosis Code(s):     --- Professional --- ?  K44.9, Diaphragmatic hernia without obstruction or  ?                       gangrene ?                       D50.9, Iron deficiency anemia, unspecified ?                       R63.0, Anorexia ?                       R63.4, Abnormal weight loss ?CPT copyright 2019 American Medical Association. All rights reserved. ?The codes documented in this report are preliminary and upon coder review may  ?be revised to meet current compliance requirements. ?Dr. Ulyess Mort ?Trenell Moxey Raeanne Gathers MD, MD ?07/29/2021 8:57:05 AM ?This report has been signed electronically. ?Number of Addenda: 0 ?Note Initiated On: 07/29/2021 8:24 AM ?Estimated Blood Loss:  Estimated blood loss: none. ?     [facility] ?

## 2021-07-30 ENCOUNTER — Ambulatory Visit: Payer: Medicare PPO | Attending: Anesthesiology | Admitting: Anesthesiology

## 2021-07-30 ENCOUNTER — Encounter: Payer: Self-pay | Admitting: Gastroenterology

## 2021-07-30 DIAGNOSIS — M170 Bilateral primary osteoarthritis of knee: Secondary | ICD-10-CM | POA: Diagnosis not present

## 2021-07-30 DIAGNOSIS — M5136 Other intervertebral disc degeneration, lumbar region: Secondary | ICD-10-CM | POA: Diagnosis not present

## 2021-07-30 DIAGNOSIS — M5135 Other intervertebral disc degeneration, thoracolumbar region: Secondary | ICD-10-CM

## 2021-07-30 DIAGNOSIS — M47816 Spondylosis without myelopathy or radiculopathy, lumbar region: Secondary | ICD-10-CM | POA: Diagnosis not present

## 2021-07-30 DIAGNOSIS — F1190 Opioid use, unspecified, uncomplicated: Secondary | ICD-10-CM

## 2021-07-30 DIAGNOSIS — M797 Fibromyalgia: Secondary | ICD-10-CM

## 2021-07-30 DIAGNOSIS — M159 Polyosteoarthritis, unspecified: Secondary | ICD-10-CM | POA: Diagnosis not present

## 2021-07-30 DIAGNOSIS — G894 Chronic pain syndrome: Secondary | ICD-10-CM

## 2021-07-30 LAB — SURGICAL PATHOLOGY

## 2021-07-30 MED ORDER — OXYCODONE HCL 10 MG PO TABS: 10.0000 mg | ORAL_TABLET | Freq: Three times a day (TID) | ORAL | 0 refills | Status: DC

## 2021-07-30 NOTE — Progress Notes (Signed)
Virtual Visit via Telephone Note ? ?I connected with Katrina Simon on 07/30/21 at  3:15 PM EDT by telephone and verified that I am speaking with the correct person using two identifiers. ? ?Location: ?Patient: Home ?Provider: Pain control center ?  ?I discussed the limitations, risks, security and privacy concerns of performing an evaluation and management service by telephone and the availability of in person appointments. I also discussed with the patient that there may be a patient responsible charge related to this service. The patient expressed understanding and agreed to proceed. ? ? ?History of Present Illness: ?I spoke with Katrina Simon and her daughter via telephone as they were unable link for the video portion of the virtual conference.  She reports that her pain is stable in nature with no recent change in characteristic or distribution.  She still taking her medications including the oxycodone 10 mg 3 times a day and this continues to work well for her and is without side effect.  She is getting good relief from the medication and no other changes are reported.  No changes in lower extremity strength function or bowel or bladder function otherwise she is doing well. ? ?Review of systems: ?General: No fevers or chills ?Pulmonary: No shortness of breath or dyspnea ?Cardiac: No angina or palpitations or lightheadedness ?GI: No abdominal pain or constipation ?Psych: No depression  ?  ?Observations/Objective: ? ?Current Outpatient Medications:  ?  albuterol (PROVENTIL HFA;VENTOLIN HFA) 108 (90 Base) MCG/ACT inhaler, Inhale into the lungs every 6 (six) hours as needed for wheezing or shortness of breath., Disp: , Rfl:  ?  aspirin EC 81 MG tablet, Take 81 mg by mouth daily. Swallow whole., Disp: , Rfl:  ?  cholecalciferol (VITAMIN D3) 25 MCG (1000 UNIT) tablet, Take 1,000 Units by mouth daily., Disp: , Rfl:  ?  cyclobenzaprine (FLEXERIL) 10 MG tablet, Take by mouth., Disp: , Rfl:  ?  diclofenac Sodium (VOLTAREN) 1 %  GEL, Apply 4 g topically 4 (four) times daily. (Patient not taking: Reported on 07/09/2021), Disp: 100 g, Rfl: 3 ?  FLUoxetine (PROZAC) 20 MG capsule, Take 40 mg by mouth daily. Take (2) capsules once daily, Disp: , Rfl:  ?  gabapentin (NEURONTIN) 300 MG capsule, TAKE 2 CAPSULES BY MOUTH AT BEDTIME (Patient taking differently: Take 600 mg by mouth at bedtime. Take (2) Capsules by mouth HS), Disp: 60 capsule, Rfl: 3 ?  lidocaine (LIDODERM) 5 %, Place 1 patch onto the skin daily., Disp: , Rfl:  ?  naloxone (NARCAN) nasal spray 4 mg/0.1 mL, Place into the nose., Disp: , Rfl:  ?  OLANZapine (ZYPREXA) 5 MG tablet, Take 5 mg by mouth daily., Disp: , Rfl:  ?  oxybutynin (DITROPAN-XL) 10 MG 24 hr tablet, Take 10 mg by mouth at bedtime., Disp: , Rfl:  ?  [START ON 07/31/2021] Oxycodone HCl 10 MG TABS, Take 1 tablet (10 mg total) by mouth in the morning, at noon, and at bedtime., Disp: 90 tablet, Rfl: 0 ?  pantoprazole (PROTONIX) 40 MG tablet, Take 40 mg by mouth daily., Disp: , Rfl:  ?  SPIRIVA HANDIHALER 18 MCG inhalation capsule, 1 capsule daily., Disp: , Rfl:  ?  sulfamethoxazole-trimethoprim (BACTRIM) 400-80 MG tablet, Take 1 tablet by mouth 3 (three) times a week., Disp: , Rfl:  ?  TRELEGY ELLIPTA 100-62.5-25 MCG/ACT AEPB, Inhale 1 puff into the lungs daily., Disp: 28 each, Rfl: 0 ?  vitamin B-12 (CYANOCOBALAMIN) 100 MCG tablet, Take 100 mcg by mouth daily.,  Disp: , Rfl:   ? ?Past Medical History:  ?Diagnosis Date  ? Achalasia   ? Allergy   ? Arthritis   ? Depression   ? Emphysema of lung ([place])   ? Fibromyalgia   ? Gastroesophageal reflux disease with esophagitis 01/26/2020  ? Formatting of this note might be different from the original. LA Grade C noted on EGD 01/2020  ? GERD (gastroesophageal reflux disease)   ? Herpes genitalis   ? History of chicken pox   ? History of kidney stones   ?  ? ?Assessment and Plan: ?1. DDD (degenerative disc disease), lumbar   ?2. DDD (degenerative disc disease), thoracolumbar   ?3.  Primary osteoarthritis of both knees   ?4. Primary osteoarthritis involving multiple joints   ?5. Facet syndrome, lumbar   ?6. Fibromyalgia   ?7. Chronic pain syndrome   ?8. Chronic, continuous use of opioids   ?Based on our review today and after review of the [facility] practitioner database information I think it is appropriate to refill her medicines from May 17.  This prescription will be sent in.  No other pharmacologic changes are initiated today.  I encouraged her to continue with back stretching strengthening exercises.  We will schedule her for follow-up in 1 month and she is to continue follow-up with her primary care physicians for baseline medical care. ? ?Follow Up Instructions: ? ?  ?I discussed the assessment and treatment plan with the patient. The patient was provided an opportunity to ask questions and all were answered. The patient agreed with the plan and demonstrated an understanding of the instructions. ?  ?The patient was advised to call back or seek an in-person evaluation if the symptoms worsen or if the condition fails to improve as anticipated. ? ?I provided 30 minutes of non-face-to-face time during this encounter. ? ? ?Molli Barrows, MD  ?

## 2021-07-30 NOTE — Anesthesia Postprocedure Evaluation (Signed)
Anesthesia Post Note ? ?Patient: Katrina Simon ? ?Procedure(s) Performed: ESOPHAGOGASTRODUODENOSCOPY (EGD) WITH PROPOFOL ? ?Patient location during evaluation: PACU ?Anesthesia Type: General ?Level of consciousness: awake and alert ?Pain management: pain level controlled ?Vital Signs Assessment: post-procedure vital signs reviewed and stable ?Respiratory status: spontaneous breathing, nonlabored ventilation, respiratory function stable and patient connected to nasal cannula oxygen ?Cardiovascular status: blood pressure returned to baseline and stable ?Postop Assessment: no apparent nausea or vomiting ?Anesthetic complications: no ? ? ?No notable events documented. ? ? ?Last Vitals:  ?Vitals:  ? 07/29/21 0816 07/29/21 0857  ?BP: (!) 153/78 127/72  ?Pulse: 67   ?Resp: 20 14  ?Temp: 36.4 ?C (!) 36.4 ?C  ?SpO2: 100% 97%  ?  ?Last Pain:  ?Vitals:  ? 07/30/21 0733  ?TempSrc:   ?PainSc: 0-No pain  ? ? ?  ?  ?  ?  ?  ?  ? ?Martha Clan ? ? ? ? ?

## 2021-07-31 ENCOUNTER — Telehealth: Payer: Self-pay | Admitting: Anesthesiology

## 2021-07-31 NOTE — Telephone Encounter (Signed)
Pt's Daughter stated that the regular Pharmacy was out of Oxycodone. Pt's Daughter would like a new script sent to the [facility] on [address] in [place]. Please call Pt and Pharmacy with an update. ?

## 2021-08-01 ENCOUNTER — Other Ambulatory Visit: Payer: Self-pay | Admitting: *Deleted

## 2021-08-01 MED ORDER — OXYCODONE HCL 10 MG PO TABS: 10.0000 mg | ORAL_TABLET | Freq: Three times a day (TID) | ORAL | 0 refills | Status: DC

## 2021-08-08 ENCOUNTER — Ambulatory Visit
Admission: RE | Admit: 2021-08-08 | Discharge: 2021-08-08 | Disposition: A | Discharge status: Home / Self Care | Payer: Medicare PPO | Source: Ambulatory Visit | Attending: Gastroenterology | Admitting: Gastroenterology

## 2021-08-08 ENCOUNTER — Encounter: Payer: Self-pay | Admitting: Certified Registered Nurse Anesthetist

## 2021-08-08 ENCOUNTER — Encounter
Admission: RE | Disposition: A | Discharge status: Home / Self Care | Payer: Self-pay | Source: Ambulatory Visit | Attending: Gastroenterology

## 2021-08-08 DIAGNOSIS — D509 Iron deficiency anemia, unspecified: Secondary | ICD-10-CM | POA: Insufficient documentation

## 2021-08-08 HISTORY — PX: GIVENS CAPSULE STUDY: SHX5432

## 2021-08-08 SURGERY — IMAGING PROCEDURE, GI TRACT, INTRALUMINAL, VIA CAPSULE

## 2021-08-09 ENCOUNTER — Encounter: Payer: Self-pay | Admitting: Gastroenterology

## 2021-08-20 ENCOUNTER — Telehealth: Payer: Self-pay

## 2021-08-20 NOTE — Telephone Encounter (Signed)
Capsule study result is capsule reached cecum. Study is complete but Suboptimal due to liquid stool in the small bowel obscuring Mucosa.  Called patient and left a message for husband to go over results. Do you recommend any further testing for the capsule study results

## 2021-08-20 NOTE — Telephone Encounter (Signed)
Will repeat video capsule endoscopy if her anemia worsens.  I will see her for follow-up as scheduled  RV

## 2021-08-21 NOTE — Telephone Encounter (Signed)
Patient verbalized understanding of results  

## 2021-08-22 ENCOUNTER — Ambulatory Visit: Payer: Medicare PPO | Attending: Anesthesiology | Admitting: Anesthesiology

## 2021-08-22 ENCOUNTER — Encounter: Payer: Self-pay | Admitting: Anesthesiology

## 2021-08-22 DIAGNOSIS — M170 Bilateral primary osteoarthritis of knee: Secondary | ICD-10-CM

## 2021-08-22 DIAGNOSIS — M47816 Spondylosis without myelopathy or radiculopathy, lumbar region: Secondary | ICD-10-CM

## 2021-08-22 DIAGNOSIS — F1190 Opioid use, unspecified, uncomplicated: Secondary | ICD-10-CM

## 2021-08-22 DIAGNOSIS — M5135 Other intervertebral disc degeneration, thoracolumbar region: Secondary | ICD-10-CM

## 2021-08-22 DIAGNOSIS — M51369 Other intervertebral disc degeneration, lumbar region without mention of lumbar back pain or lower extremity pain: Secondary | ICD-10-CM

## 2021-08-22 DIAGNOSIS — M5136 Other intervertebral disc degeneration, lumbar region: Secondary | ICD-10-CM | POA: Diagnosis not present

## 2021-08-22 DIAGNOSIS — M797 Fibromyalgia: Secondary | ICD-10-CM

## 2021-08-22 DIAGNOSIS — M159 Polyosteoarthritis, unspecified: Secondary | ICD-10-CM

## 2021-08-22 DIAGNOSIS — G894 Chronic pain syndrome: Secondary | ICD-10-CM

## 2021-08-22 DIAGNOSIS — M150 Primary generalized (osteo)arthritis: Secondary | ICD-10-CM

## 2021-08-22 MED ORDER — OXYCODONE HCL 10 MG PO TABS: 10.0000 mg | ORAL_TABLET | Freq: Three times a day (TID) | ORAL | 0 refills | Status: AC

## 2021-08-22 NOTE — Progress Notes (Signed)
Virtual Visit via Telephone Note  I connected with Katrina Simon on 08/22/21 at 11:00 AM EDT by telephone and verified that I am speaking with the correct person using two identifiers.  Location: Patient: Home Provider: Pain control center   I discussed the limitations, risks, security and privacy concerns of performing an evaluation and management service by telephone and the availability of in person appointments. I also discussed with the patient that there may be a patient responsible charge related to this service. The patient expressed understanding and agreed to proceed.   History of Present Illness:  I spoke with [name] regarding her low back pain and diffuse body pain via telephone as she was unable to link with the video portion of the conference.  She reports that the back pain and diffuse body pain with fibromyalgia that she has experienced remains problematic but reasonably well managed with her current opioid dosing.  She is taking her medications as prescribed and these continue to work well for her.  She does have breakthrough pain during the day and previously has been on a higher dose of opioid.  She is currently taking 10 mg tablets 3 times a day and this does give her good relief rated about 75% relief lasting about 4 to 6 hours.  Otherwise she is in her usual state of health with no new change in the quality characteristic or distribution of the pain complaints.  She is trying to stay active doing her exercises and otherwise has been stable.  Review of systems: General: No fevers or chills Pulmonary: No shortness of breath or dyspnea Cardiac: No angina or palpitations or lightheadedness GI: No abdominal pain or constipation Psych: No depression  Observations/Objective: 1. DDD (degenerative disc disease), lumbar   2. DDD (degenerative disc disease), thoracolumbar   3. Primary osteoarthritis of both knees   4. Primary osteoarthritis involving multiple joints   5.  Facet syndrome, lumbar   6. Fibromyalgia   7. Chronic pain syndrome   8. Chronic, continuous use of opioids      Past Medical History:  Diagnosis Date   Achalasia    Allergy    Arthritis    Depression    Emphysema of lung ([place])    Fibromyalgia    Gastroesophageal reflux disease with esophagitis 01/26/2020   Formatting of this note might be different from the original. LA Grade C noted on EGD 01/2020   GERD (gastroesophageal reflux disease)    Herpes genitalis    History of chicken pox    History of kidney stones      Assessment and Plan: 1. DDD (degenerative disc disease), lumbar   2. DDD (degenerative disc disease), thoracolumbar   3. Primary osteoarthritis of both knees   4. Primary osteoarthritis involving multiple joints   5. Facet syndrome, lumbar   6. Fibromyalgia   7. Chronic pain syndrome   8. Chronic, continuous use of opioids   Had a long discussion with [name] regarding the nature of her pathology and treatment options.  Though she has been on higher dose opioid therapy in the past I feel that our present therapy is appropriate for her given condition.  She is getting good relief with the medications and no side effects are reported.  She has failed more conservative therapy unfortunately.  She has been on a prolonged course of chronic opioid management and this has generally worked well for her.  At present she is getting good relief but desires to go to  a higher dosing.  I cautioned her against this.  She may require a narcotic holiday.  I want her to stay active and continue with her exercise management.  No other changes are initiated today.  I want her to continue follow-up with her primary care physicians for baseline medical care.  Follow Up Instructions:    I discussed the assessment and treatment plan with the patient. The patient was provided an opportunity to ask questions and all were answered. The patient agreed with the plan and demonstrated an  understanding of the instructions.   The patient was advised to call back or seek an in-person evaluation if the symptoms worsen or if the condition fails to improve as anticipated.  I provided 30 minutes of non-face-to-face time during this encounter.   Molli Barrows, MD

## 2021-08-22 NOTE — Progress Notes (Deleted)
Nursing Pain Medication Assessment:  Safety precautions to be maintained throughout the outpatient stay will include: orient to surroundings, keep bed in low position, maintain call bell within reach at all times, provide assistance with transfer out of bed and ambulation.  Medication Inspection Compliance: Pill count conducted under aseptic conditions, in front of the patient. Neither the pills nor the bottle was removed from the patient's sight at any time. Once count was completed pills were immediately returned to the patient in their original bottle.  Medication: Oxycodone IR Pill/Patch Count:  35 of 60 pills remain Pill/Patch Appearance: Markings consistent with prescribed medication Bottle Appearance: Standard pharmacy container. Clearly labeled. Filled Date: 5 / 20 / 2023 Last Medication intake:  TodaySafety precautions to be maintained throughout the outpatient stay will include: orient to surroundings, keep bed in low position, maintain call bell within reach at all times, provide assistance with transfer out of bed and ambulation.

## 2021-08-30 ENCOUNTER — Telehealth: Payer: Self-pay | Admitting: Anesthesiology

## 2021-08-30 NOTE — Telephone Encounter (Signed)
Patient informed that her script could be picked up tomorrow at [facility] in [place]. [address].

## 2021-08-30 NOTE — Telephone Encounter (Signed)
Patients' husband Pia Mau at 9:29 this am. Has questions about her pain med script, when can it be picked up and where. Please call [phone]

## 2021-10-16 DIAGNOSIS — G2401 Drug induced subacute dyskinesia: Secondary | ICD-10-CM | POA: Insufficient documentation

## 2021-10-29 ENCOUNTER — Telehealth: Payer: Self-pay | Admitting: Anesthesiology

## 2021-10-29 NOTE — Telephone Encounter (Signed)
Patient husband stated that his wife will be out of meds on 10-31-21. Wants to know if shw can get an refill until her appt on Monday. Please give patient a call. Thanks

## 2021-10-31 ENCOUNTER — Other Ambulatory Visit: Payer: Self-pay

## 2021-10-31 NOTE — Telephone Encounter (Signed)
Called pharm and they stated that she has one prescription at pharm in [place]. Called Katrina Simon to let them know this information

## 2021-11-04 ENCOUNTER — Ambulatory Visit: Payer: Medicare PPO | Attending: Anesthesiology | Admitting: Anesthesiology

## 2021-11-04 ENCOUNTER — Telehealth: Payer: Self-pay

## 2021-11-04 ENCOUNTER — Encounter: Payer: Self-pay | Admitting: Anesthesiology

## 2021-11-04 VITALS — BP 139/80 | HR 104 | Temp 97.4°F | Resp 14 | Ht 69.0 in | Wt 100.0 lb

## 2021-11-04 DIAGNOSIS — M159 Polyosteoarthritis, unspecified: Secondary | ICD-10-CM

## 2021-11-04 DIAGNOSIS — M47816 Spondylosis without myelopathy or radiculopathy, lumbar region: Secondary | ICD-10-CM

## 2021-11-04 DIAGNOSIS — G894 Chronic pain syndrome: Secondary | ICD-10-CM

## 2021-11-04 DIAGNOSIS — M5135 Other intervertebral disc degeneration, thoracolumbar region: Secondary | ICD-10-CM | POA: Diagnosis not present

## 2021-11-04 DIAGNOSIS — M5136 Other intervertebral disc degeneration, lumbar region: Secondary | ICD-10-CM

## 2021-11-04 DIAGNOSIS — M170 Bilateral primary osteoarthritis of knee: Secondary | ICD-10-CM | POA: Diagnosis not present

## 2021-11-04 DIAGNOSIS — M797 Fibromyalgia: Secondary | ICD-10-CM

## 2021-11-04 DIAGNOSIS — F1190 Opioid use, unspecified, uncomplicated: Secondary | ICD-10-CM

## 2021-11-04 MED ORDER — OXYCODONE HCL 10 MG PO TABS: 10.0000 mg | ORAL_TABLET | Freq: Three times a day (TID) | ORAL | 0 refills | Status: AC | PRN

## 2021-11-04 MED ORDER — OXYCODONE HCL 10 MG PO TABS: 10.0000 mg | ORAL_TABLET | Freq: Three times a day (TID) | ORAL | 0 refills | Status: DC | PRN

## 2021-11-04 NOTE — Telephone Encounter (Signed)
Patient  came in for a visit.  Dr Andree Elk called and states that he saw her 5 months ago and that she was put on for a virtual appointment and didn't know why she was in the clinic.  Dr Andree Elk notified that she did not bring pills for count.  Dr Andree Elk states he will do a virtual visit at 4pm.  Patient notified.

## 2021-11-04 NOTE — Progress Notes (Unsigned)
Nursing Pain Medication Assessment:  Safety precautions to be maintained throughout the outpatient stay will include: orient to surroundings, keep bed in low position, maintain call bell within reach at all times, provide assistance with transfer out of bed and ambulation.  Medication Inspection Compliance: Katrina Simon did not comply with our request to bring her pills to be counted. She was reminded that bringing the medication bottles, even when empty, is a requirement.  Medication: None brought in. Pill/Patch Count: None available to be counted. Bottle Appearance: No container available. Did not bring bottle(s) to appointment. Filled Date: N/A Last Medication intake:  Ran out of medicine more than 48 hours ago Safety precautions to be maintained throughout the outpatient stay will include: orient to surroundings, keep bed in low position, maintain call bell within reach at all times, provide assistance with transfer out of bed and ambulation.

## 2021-11-06 NOTE — Progress Notes (Signed)
Subjective:  Patient ID: Katrina Simon, female    DOB: 11-26-1950  Age: 71 y.o. MRN: [id number]  CC: Back Pain (lower)   Procedure: None  HPI Katrina Simon presents for reevaluation.  She was seen in the clinic by her nursing staff I later spoke with her via telephone for reevaluation secondary to scheduling difficulties.  Based on review with her nursing staff and our discussion she seems to be doing very well with her existing pain medication management program.  She continues to have good relief with the oxycodone taking this 3 times a day.  No side effects are reported.  The quality characteristic and distribution of her low back pain and lower extremity pain is stable with no recent changes noted.  She is trying to stay active and working on her core strengthening.  She continues to have intermittent diffuse pain from the fibromyalgia and her low back but the oxycodone generally gives her about 50 to 75% relief lasting about 6 to 8 hours for she has recurrence of the same pain.  Otherwise she reports being in her usual state of health.  Outpatient Medications Prior to Visit  Medication Sig Dispense Refill   albuterol (PROVENTIL HFA;VENTOLIN HFA) 108 (90 Base) MCG/ACT inhaler Inhale into the lungs every 6 (six) hours as needed for wheezing or shortness of breath.     aspirin EC 81 MG tablet Take 81 mg by mouth daily. Swallow whole.     cholecalciferol (VITAMIN D3) 25 MCG (1000 UNIT) tablet Take 1,000 Units by mouth daily.     cyclobenzaprine (FLEXERIL) 10 MG tablet Take by mouth.     lidocaine (LIDODERM) 5 % Place 1 patch onto the skin daily.     naloxone (NARCAN) nasal spray 4 mg/0.1 mL Place into the nose.     oxybutynin (DITROPAN-XL) 10 MG 24 hr tablet Take 10 mg by mouth at bedtime.     pantoprazole (PROTONIX) 40 MG tablet Take 40 mg by mouth daily.     SPIRIVA HANDIHALER 18 MCG inhalation capsule 1 capsule daily.     vitamin B-12 (CYANOCOBALAMIN) 100 MCG tablet Take 100 mcg by mouth  daily.     diclofenac Sodium (VOLTAREN) 1 % GEL Apply 4 g topically 4 (four) times daily. (Patient not taking: Reported on 07/09/2021) 100 g 3   FLUoxetine (PROZAC) 20 MG capsule Take 40 mg by mouth daily. Take (2) capsules once daily (Patient not taking: Reported on 11/04/2021)     gabapentin (NEURONTIN) 300 MG capsule TAKE 2 CAPSULES BY MOUTH AT BEDTIME (Patient not taking: Reported on 11/04/2021) 60 capsule 3   OLANZapine (ZYPREXA) 5 MG tablet Take 5 mg by mouth daily. (Patient not taking: Reported on 11/04/2021)     sulfamethoxazole-trimethoprim (BACTRIM) 400-80 MG tablet Take 1 tablet by mouth 3 (three) times a week. (Patient not taking: Reported on 11/04/2021)     TRELEGY ELLIPTA 100-62.5-25 MCG/ACT AEPB Inhale 1 puff into the lungs daily. (Patient not taking: Reported on 11/04/2021) 28 each 0   No facility-administered medications prior to visit.    Review of Systems CNS: No confusion or sedation Cardiac: No angina or palpitations GI: No abdominal pain or constipation Constitutional: No nausea vomiting fevers or chills  Objective:  BP 139/80   Pulse (!) 104   Temp (!) 97.4 F (36.3 C) (Temporal)   Resp 14   Ht '5\' 9"'$  (1.753 m)   Wt 100 lb (45.4 kg)   LMP  (LMP Unknown)   SpO2  100%   BMI 14.77 kg/m    BP Readings from Last 3 Encounters:  11/04/21 139/80  07/29/21 127/72  07/09/21 111/72     Wt Readings from Last 3 Encounters:  11/04/21 100 lb (45.4 kg)  07/29/21 111 lb (50.3 kg)  07/09/21 113 lb 4 oz (51.4 kg)     Physical Exam Pt is alert and oriented PERRL EOMI HEART IS RRR no murmur or rub LCTA no wheezing or rales MUSCULOSKELETAL deferred  Labs  No results found for: "HGBA1C" Lab Results  Component Value Date   CREATININE 0.65 06/21/2021    -------------------------------------------------------------------------------------------------------------------- Lab Results  Component Value Date   WBC 7.1 07/09/2021   HGB 11.0 (L) 07/09/2021   HCT 32.7 (L)  07/09/2021   PLT 283 07/09/2021   GLUCOSE 120 (H) 06/21/2021   ALT 16 06/20/2021   AST 25 06/20/2021   NA 139 06/21/2021   K 3.8 06/21/2021   CL 103 06/21/2021   CREATININE 0.65 06/21/2021   BUN 9 06/21/2021   CO2 28 06/21/2021   TSH 0.304 (L) 07/26/2020   INR 1.1 07/26/2020    --------------------------------------------------------------------------------------------------------------------- No results found.   Assessment & Plan:   Katrina Simon was seen today for back pain.  Diagnoses and all orders for this visit:  DDD (degenerative disc disease), lumbar  DDD (degenerative disc disease), thoracolumbar  Primary osteoarthritis of both knees  Primary osteoarthritis involving multiple joints  Facet syndrome, lumbar  Fibromyalgia  Chronic, continuous use of opioids  Chronic pain syndrome  Other orders -     Oxycodone HCl 10 MG TABS; Take 1 tablet (10 mg total) by mouth every 8 (eight) hours as needed. -     Oxycodone HCl 10 MG TABS; Take 1 tablet (10 mg total) by mouth every 8 (eight) hours as needed.        ----------------------------------------------------------------------------------------------------------------------  Problem List Items Addressed This Visit       Unprioritized   Chronic pain syndrome   Relevant Medications   Oxycodone HCl 10 MG TABS   Oxycodone HCl 10 MG TABS (Start on 12/04/2021)   Chronic, continuous use of opioids   DDD (degenerative disc disease), lumbar - Primary   Relevant Medications   Oxycodone HCl 10 MG TABS   Oxycodone HCl 10 MG TABS (Start on 12/04/2021)   DDD (degenerative disc disease), thoracolumbar   Relevant Medications   Oxycodone HCl 10 MG TABS   Oxycodone HCl 10 MG TABS (Start on 12/04/2021)   DJD (degenerative joint disease) shoulder, sacroiliac joint    Relevant Medications   Oxycodone HCl 10 MG TABS   Oxycodone HCl 10 MG TABS (Start on 12/04/2021)   Facet syndrome, lumbar   Relevant Medications   Oxycodone HCl  10 MG TABS   Oxycodone HCl 10 MG TABS (Start on 12/04/2021)   Fibromyalgia   Relevant Medications   Oxycodone HCl 10 MG TABS   Oxycodone HCl 10 MG TABS (Start on 12/04/2021)      ----------------------------------------------------------------------------------------------------------------------  1. DDD (degenerative disc disease), lumbar At this point I think is reasonable based on our discussion to continue with the current treatment plan.  No other changes are initiated today I will continue her on her medication management at 3 times a day dosing for August 21 and September 20.  I have reviewed the [facility] practitioner database information and is appropriate for refill.  No other changes are initiated today.  Continue efforts at stretching strengthening.  2. DDD (degenerative disc disease), thoracolumbar As above  3. Primary osteoarthritis of both knees   4. Primary osteoarthritis involving multiple joints   5. Facet syndrome, lumbar As above  6. Fibromyalgia As above  7. Chronic, continuous use of opioids   8. Chronic pain syndrome As above and continue follow-up with her primary care physicians for baseline medical care.    ----------------------------------------------------------------------------------------------------------------------  I am having Katrina Simon start on Oxycodone HCl and Oxycodone HCl. I am also having her maintain her albuterol, OLANZapine, diclofenac Sodium, aspirin EC, vitamin B-12, pantoprazole, gabapentin, FLUoxetine, oxybutynin, sulfamethoxazole-trimethoprim, lidocaine, cholecalciferol, Trelegy Ellipta, Spiriva HandiHaler, naloxone, and cyclobenzaprine.   Meds ordered this encounter  Medications   Oxycodone HCl 10 MG TABS    Sig: Take 1 tablet (10 mg total) by mouth every 8 (eight) hours as needed.    Dispense:  90 tablet    Refill:  0   Oxycodone HCl 10 MG TABS    Sig: Take 1 tablet (10 mg total) by mouth every 8 (eight)  hours as needed.    Dispense:  90 tablet    Refill:  0   Patient's Medications  New Prescriptions   OXYCODONE HCL 10 MG TABS    Take 1 tablet (10 mg total) by mouth every 8 (eight) hours as needed.   OXYCODONE HCL 10 MG TABS    Take 1 tablet (10 mg total) by mouth every 8 (eight) hours as needed.  Previous Medications   ALBUTEROL (PROVENTIL HFA;VENTOLIN HFA) 108 (90 BASE) MCG/ACT INHALER    Inhale into the lungs every 6 (six) hours as needed for wheezing or shortness of breath.   ASPIRIN EC 81 MG TABLET    Take 81 mg by mouth daily. Swallow whole.   CHOLECALCIFEROL (VITAMIN D3) 25 MCG (1000 UNIT) TABLET    Take 1,000 Units by mouth daily.   CYCLOBENZAPRINE (FLEXERIL) 10 MG TABLET    Take by mouth.   DICLOFENAC SODIUM (VOLTAREN) 1 % GEL    Apply 4 g topically 4 (four) times daily.   FLUOXETINE (PROZAC) 20 MG CAPSULE    Take 40 mg by mouth daily. Take (2) capsules once daily   GABAPENTIN (NEURONTIN) 300 MG CAPSULE    TAKE 2 CAPSULES BY MOUTH AT BEDTIME   LIDOCAINE (LIDODERM) 5 %    Place 1 patch onto the skin daily.   NALOXONE (NARCAN) NASAL SPRAY 4 MG/0.1 ML    Place into the nose.   OLANZAPINE (ZYPREXA) 5 MG TABLET    Take 5 mg by mouth daily.   OXYBUTYNIN (DITROPAN-XL) 10 MG 24 HR TABLET    Take 10 mg by mouth at bedtime.   PANTOPRAZOLE (PROTONIX) 40 MG TABLET    Take 40 mg by mouth daily.   SPIRIVA HANDIHALER 18 MCG INHALATION CAPSULE    1 capsule daily.   SULFAMETHOXAZOLE-TRIMETHOPRIM (BACTRIM) 400-80 MG TABLET    Take 1 tablet by mouth 3 (three) times a week.   TRELEGY ELLIPTA 100-62.5-25 MCG/ACT AEPB    Inhale 1 puff into the lungs daily.   VITAMIN B-12 (CYANOCOBALAMIN) 100 MCG TABLET    Take 100 mcg by mouth daily.  Modified Medications   No medications on file  Discontinued Medications   No medications on file   ----------------------------------------------------------------------------------------------------------------------  Follow-up: Return in about 2 months (around  01/04/2022) for evaluation, med refill.    Molli Barrows, MD

## 2021-11-09 ENCOUNTER — Other Ambulatory Visit: Payer: Self-pay | Admitting: Anesthesiology

## 2021-11-18 DIAGNOSIS — F0150 Vascular dementia without behavioral disturbance: Secondary | ICD-10-CM | POA: Insufficient documentation

## 2021-11-18 DIAGNOSIS — N3941 Urge incontinence: Secondary | ICD-10-CM | POA: Insufficient documentation

## 2021-12-05 ENCOUNTER — Ambulatory Visit (INDEPENDENT_AMBULATORY_CARE_PROVIDER_SITE_OTHER): Payer: Medicare PPO | Admitting: Gastroenterology

## 2021-12-05 ENCOUNTER — Other Ambulatory Visit: Payer: Self-pay

## 2021-12-05 ENCOUNTER — Encounter: Payer: Self-pay | Admitting: Gastroenterology

## 2021-12-05 VITALS — BP 123/61 | HR 75 | Temp 98.3°F | Ht 69.0 in | Wt 103.4 lb

## 2021-12-05 DIAGNOSIS — D509 Iron deficiency anemia, unspecified: Secondary | ICD-10-CM | POA: Diagnosis not present

## 2021-12-05 NOTE — Progress Notes (Signed)
Cephas Darby, MD [address]  [place]  [place], [place] [address]  Main: [phone]  Fax: [phone]    Gastroenterology Consultation  Referring Provider:     [name], MD Primary Care Physician:  [name], MD Primary Gastroenterologist:  Dr. Cephas Darby Reason for Consultation: Iron deficiency anemia        HPI:   Katrina Simon is a 71 y.o. female referred by Dr. Iona Beard, Rubbie Battiest, MD  for consultation & management of unintentional weight loss, dysphagia.  Patient reports that she has been having difficulty swallowing and she underwent upper endoscopy in 04/2020 at [facility], reported to have Barrett's esophagus, biopsies were performed.  Pathology results are not available with me.  There was no evidence of stricture.  Patient does have history of iron deficiency anemia.  Her B12 and folate levels were normal.  Patient is accompanied by her husband today and he is concerned about recent history of significant weight loss.  She lost about 65 pounds within last several months.  He reports that her appetite has improved and she regained few pounds.  Patient denies any abdominal pain.  However, she does report intermittent loose stools.  She denies any rectal bleeding.  She does report abdominal bloating.  Patient does report spontaneous bruising.  Follow-up visit 01/01/2021 Patient underwent colonoscopy which was unremarkable including biopsies.  Patient's son who accompanied her today, reported that he turned in the stool specimen, apparently the test was canceled.  Patient reports that she has been eating more and gained 4 pounds.  She denies any dysphagia.  Patient did not tolerate Creon samples, felt worsening of abdominal pain.  She is no longer experiencing diarrhea, her bowel movements are up to 3 times a week, describes on Bristol stool scale as 3  Follow-up visit 07/09/2021 Patient was admitted to [facility] secondary to acute respiratory failure from pneumonia  in early April 2023.  She was treated with ceftriaxone and azithromycin.  Patient is now currently being treated for shingles, went to urgent care on 4/23, started on valacyclovir.  She is also found to have recurrence of anemia hemoglobin 9.4 on 06/21/2021, MCV normal, normal platelets, BMP normal.  Patient is taking oral iron 1 pill daily.  She reports that since she was released from the hospital, she is not having bowel movements regularly.  Her weight has been stable and her appetite has been okay.  She denies any fever, chills, nausea or vomiting.  Patient could not tolerate Zenpep.  Follow-up visit 12/05/2021 Patient is here for follow-up of iron deficiency anemia.  Currently, her iron deficiency anemia has resolved.  Most recent hemoglobin from August was 12.6.  Serum ferritin levels from April were 154.  She is diagnosed with dementia and accompanied by her son today.  Patient's son states that she tends to forget finishing her meal at times has to be reminded.  She lost weight.  Denies any constipation, abdominal pain, nausea or vomiting.  NSAIDs: None  Antiplts/Anticoagulants/Anti thrombotics: None  GI Procedures:  Colonoscopy 10/04/2020 Sigmoid diverticulosis, normal terminal ileum, normal colon Random colon biopsies were unremarkable  Upper endoscopy 05/09/2020    Upper endoscopy 11/19/2016 There were esophageal mucosal changes suspicious for short-segment Barrett's esophagus present in the distal esophagus. The maximum longitudinal extent of these mucosal changes was 2 cm in length. Mucosa was biopsied with a cold forceps for histology. One specimen bottle was sent to pathology. DIAGNOSIS:  A.  DISTAL ESOPHAGUS;  COLD BIOPSY:  - GASTRIC TYPE MUCOSA WITH MINIMAL CHRONIC INFLAMMATION.  - NEGATIVE FOR SQUAMOUS MUCOSA, GOBLET CELLS, DYSPLASIA AND MALIGNANCY.  Colonoscopy 05/10/2013 Diagnosis:  Part A: ASCENDING COLON POLYP COLD BIOPSY:  - TUBULAR ADENOMA, 1 OF 3 FRAGMENTS.  - NEGATIVE  FOR HIGH GRADE DYSPLASIA AND MALIGNANCY.  .  Part B: SIGMOID COLON POLYP HOT SNARE:  - HYPERPLASTIC POLYP.  - NEGATIVE FOR DYSPLASIA AND MALIGNANCY.  .  Part C: SIGMOID COLON POLYP COLD BIOPSY:  - HYPERPLASTIC POLYP.  - NEGATIVE FOR DYSPLASIA AND MALIGNANCY.  .  Part D: RECTOSIGMOID COLON POLYP COLD BIOPSY:  - HYPERPLASTIC POLYP, 2 FRAGMENTS.  - NEGATIVE FOR DYSPLASIA AND MALIGNANCY.   Past Medical History:  Diagnosis Date   Achalasia    Allergy    Arthritis    Depression    Emphysema of lung ([place])    Fibromyalgia    Gastroesophageal reflux disease with esophagitis 01/26/2020   Formatting of this note might be different from the original. LA Grade C noted on EGD 01/2020   GERD (gastroesophageal reflux disease)    Herpes genitalis    History of chicken pox    History of kidney stones     Past Surgical History:  Procedure Laterality Date   CESAREAN SECTION     COLONOSCOPY WITH PROPOFOL N/A 10/03/2020   Procedure: COLONOSCOPY WITH PROPOFOL;  Surgeon: Lin Landsman, MD;  Location: [place];  Service: Gastroenterology;  Laterality: N/A;   COLONOSCOPY WITH PROPOFOL N/A 10/04/2020   Procedure: COLONOSCOPY WITH PROPOFOL;  Surgeon: Lin Landsman, MD;  Location: [place];  Service: Endoscopy;  Laterality: N/A;   ESOPHAGOGASTRODUODENOSCOPY (EGD) WITH PROPOFOL N/A 11/19/2016   Procedure: ESOPHAGOGASTRODUODENOSCOPY (EGD) WITH PROPOFOL;  Surgeon: Manya Silvas, MD;  Location[facility] ENDOSCOPY;  Service: Endoscopy;  Laterality: N/A;   ESOPHAGOGASTRODUODENOSCOPY (EGD) WITH PROPOFOL N/A 07/29/2021   Procedure: ESOPHAGOGASTRODUODENOSCOPY (EGD) WITH PROPOFOL;  Surgeon: Lin Landsman, MD;  Location: [facility] ENDOSCOPY;  Service: Gastroenterology;  Laterality: N/A;   GIVENS CAPSULE STUDY N/A 08/08/2021   Procedure: GIVENS CAPSULE STUDY;  Surgeon: Lin Landsman, MD;  Location: [facility] ENDOSCOPY;  Service: Gastroenterology;  Laterality: N/A;   LAPAROSCOPY N/A     done at [facility] to release esophageal muscles    Current Outpatient Medications:    albuterol (PROVENTIL HFA;VENTOLIN HFA) 108 (90 Base) MCG/ACT inhaler, Inhale into the lungs every 6 (six) hours as needed for wheezing or shortness of breath., Disp: , Rfl:    aspirin EC 81 MG tablet, Take 81 mg by mouth daily. Swallow whole., Disp: , Rfl:    Cholecalciferol 25 MCG (1000 UT) tablet, Take by mouth., Disp: , Rfl:    cyclobenzaprine (FLEXERIL) 10 MG tablet, Take by mouth., Disp: , Rfl:    diclofenac Sodium (VOLTAREN) 1 % GEL, Apply 4 g topically 4 (four) times daily., Disp: 100 g, Rfl: 3   escitalopram (LEXAPRO) 10 MG tablet, Take 10 mg by mouth daily., Disp: , Rfl:    folic acid (FOLVITE) 1 MG tablet, Take by mouth., Disp: , Rfl:    ipratropium-albuterol (DUONEB) 0.5-2.5 (3) MG/3ML SOLN, Inhale into the lungs., Disp: , Rfl:    lidocaine (LIDODERM) 5 %, Place 1 patch onto the skin daily., Disp: , Rfl:    naloxone (NARCAN) nasal spray 4 mg/0.1 mL, Place into the nose., Disp: , Rfl:    Oxycodone HCl 10 MG TABS, Take 1 tablet (10 mg total) by mouth every 8 (eight) hours as needed., Disp: 90 tablet,  Rfl: 0   pantoprazole (PROTONIX) 40 MG tablet, Take 40 mg by mouth daily., Disp: , Rfl:    solifenacin (VESICARE) 5 MG tablet, Take by mouth., Disp: , Rfl:    vitamin B-12 (CYANOCOBALAMIN) 100 MCG tablet, Take 100 mcg by mouth daily., Disp: , Rfl:    OLANZapine (ZYPREXA) 5 MG tablet, Take 5 mg by mouth daily. (Patient not taking: Reported on 11/04/2021), Disp: , Rfl:     Family History  Problem Relation Age of Onset   Arthritis Mother    Heart disease Father    Stroke Father    Breast cancer Neg Hx      Social History   Tobacco Use   Smoking status: Former    Packs/day: 0.50    Years: 45.00    Total pack years: 22.50    Types: Cigarettes    Quit date: 09/16/2012    Years since quitting: 9.2   Smokeless tobacco: Never  Vaping Use   Vaping Use: Former  Substance Use Topics   Alcohol  use: No    Alcohol/week: 0.0 standard drinks of alcohol   Drug use: No    Allergies as of 12/05/2021 - Review Complete 12/05/2021  Allergen Reaction Noted   Penicillins Hives 07/18/2014   Statins Other (See Comments) 07/18/2014    Review of Systems:    All systems reviewed and negative except where noted in HPI.   Physical Exam:  BP 123/61 (BP Location: Left Arm, Patient Position: Sitting, Cuff Size: Normal)   Pulse 75   Temp 98.3 F (36.8 C) (Oral)   Ht '5\' 9"'$  (1.753 m)   Wt 103 lb 6 oz (46.9 kg)   LMP  (LMP Unknown)   BMI 15.27 kg/m  No LMP recorded (lmp unknown). Patient is postmenopausal.  General:   Alert, thin built, poorly nourished, pleasant and cooperative in NAD Head:  Normocephalic and atraumatic, bitemporal wasting. Eyes:  Sclera clear, no icterus.   Conjunctiva pink. Ears:  Normal auditory acuity. Nose:  No deformity, discharge, or lesions. Mouth:  No deformity or lesions,oropharynx pink & moist. Neck:  Supple; no masses or thyromegaly. Lungs:  Respirations even and unlabored.  Clear throughout to auscultation.   No wheezes, crackles, or rhonchi. No acute distress. Heart:  Regular rate and rhythm; no murmurs, clicks, rubs, or gallops. Abdomen:  Normal bowel sounds. Soft, scaphoid, non-tender and non-distended without masses, hepatosplenomegaly or hernias noted.  No guarding or rebound tenderness.   Rectal: Not performed Msk:  Symmetrical without gross deformities.  Pulses:  Normal pulses noted. Extremities:  No clubbing or edema.  No cyanosis. Neurologic:  Alert and oriented x3;  grossly normal neurologically. Psych:  Alert and cooperative. Normal mood and affect.  Imaging Studies: Reviewed  Assessment and Plan:   Katrina Simon is a 71 y.o. 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  Endoscopy report is available with no evidence of structural lesions identified except for short segment Barrett's esophagus.   Pathology results revealed short segment Barrett's with no evidence of dysplasia Continue pantoprazole 40 mg daily long-term for chemoprevention Recommend upper endoscopy in 3 years for surveillance of Barrett's esophagus  Unexplained weight loss and intermittent loose stools: Weight has stabilized, slowly gaining weight, loose stools have resolved Patient has strong tobacco  history CT abdomen and pelvis with contrast was unremarkable colonoscopy with TI evaluation and random colon biopsies were unremarkable Celiac disease panel and vitamin A, D, E and K levels were normal Patient could not tolerate pancreatic enzymes Her weight loss is most likely secondary to dementia and poor p.o. intake rather than malabsorption  History of iron deficiency anemia without signs of overt GI bleed: Currently resolved EGD and colonoscopy are unremarkable in 2022 Video capsule endoscopy in 08/2021 was inconclusive due to poor prep Recheck labs today    Follow up in 6 months   Cephas Darby, MD

## 2021-12-06 LAB — CBC
Hematocrit: 36.1 % (ref 34.0–46.6)
Hemoglobin: 11.8 g/dL (ref 11.1–15.9)
MCH: 29.4 pg (ref 26.6–33.0)
MCHC: 32.7 g/dL (ref 31.5–35.7)
MCV: 90 fL (ref 79–97)
Platelets: 282 10*3/uL (ref 150–450)
RBC: 4.02 x10E6/uL (ref 3.77–5.28)
RDW: 13.2 % (ref 11.7–15.4)
WBC: 7.4 10*3/uL (ref 3.4–10.8)

## 2021-12-06 LAB — IRON,TIBC AND FERRITIN PANEL
Ferritin: 122 ng/mL (ref 15–150)
Iron Saturation: 33 % (ref 15–55)
Iron: 101 ug/dL (ref 27–139)
Total Iron Binding Capacity: 304 ug/dL (ref 250–450)
UIBC: 203 ug/dL (ref 118–369)

## 2021-12-09 ENCOUNTER — Telehealth: Payer: Self-pay

## 2021-12-09 NOTE — Telephone Encounter (Signed)
-----   Message from Lin Landsman, MD sent at 12/06/2021 10:44 AM EDT ----- No anemia and normal ferritin levels. Can switch to oral iron pill alternate days  RV

## 2021-12-09 NOTE — Telephone Encounter (Signed)
Patient son verbalized understanding of results

## 2021-12-25 ENCOUNTER — Ambulatory Visit: Payer: Medicare PPO | Admitting: Anesthesiology

## 2021-12-31 ENCOUNTER — Ambulatory Visit: Payer: Medicare PPO | Attending: Anesthesiology | Admitting: Anesthesiology

## 2021-12-31 DIAGNOSIS — F1190 Opioid use, unspecified, uncomplicated: Secondary | ICD-10-CM

## 2021-12-31 DIAGNOSIS — M47816 Spondylosis without myelopathy or radiculopathy, lumbar region: Secondary | ICD-10-CM | POA: Diagnosis not present

## 2021-12-31 DIAGNOSIS — M159 Polyosteoarthritis, unspecified: Secondary | ICD-10-CM | POA: Diagnosis not present

## 2021-12-31 DIAGNOSIS — M170 Bilateral primary osteoarthritis of knee: Secondary | ICD-10-CM | POA: Diagnosis not present

## 2021-12-31 DIAGNOSIS — M797 Fibromyalgia: Secondary | ICD-10-CM

## 2021-12-31 DIAGNOSIS — M5135 Other intervertebral disc degeneration, thoracolumbar region: Secondary | ICD-10-CM | POA: Diagnosis not present

## 2021-12-31 DIAGNOSIS — M5136 Other intervertebral disc degeneration, lumbar region: Secondary | ICD-10-CM | POA: Diagnosis not present

## 2021-12-31 DIAGNOSIS — G894 Chronic pain syndrome: Secondary | ICD-10-CM

## 2022-01-01 ENCOUNTER — Encounter: Payer: Self-pay | Admitting: Anesthesiology

## 2022-01-01 MED ORDER — OXYCODONE HCL 10 MG PO TABS: 10.0000 mg | ORAL_TABLET | Freq: Three times a day (TID) | ORAL | 0 refills | Status: AC | PRN

## 2022-01-01 MED ORDER — OXYCODONE HCL 10 MG PO TABS: 10.0000 mg | ORAL_TABLET | Freq: Three times a day (TID) | ORAL | 0 refills | Status: AC

## 2022-01-01 NOTE — Progress Notes (Signed)
Virtual Visit via Telephone Note  I connected with Katrina Simon on 01/01/22 at  1:45 PM EDT by telephone and verified that I am speaking with the correct person using two identifiers.  Location: Patient: Home Provider: Pain control center   I discussed the limitations, risks, security and privacy concerns of performing an evaluation and management service by telephone and the availability of in person appointments. I also discussed with the patient that there may be a patient responsible charge related to this service. The patient expressed understanding and agreed to proceed.   History of Present Illness: Spoke with [name] via telephone as we are unable link for the video portion of the conference.  She reports that she is doing reasonably well with her mid and lower back pain.  No changes have been reported.  The quality characteristic and distribution remained stable.  She continues to take her medications as prescribed and these are working well for her.  She reports about a 70% improvement in her pain with the medications administered.  She denies any diverting or illicit use.  She is trying to stay active as best possible.  Lower extremity strength function bowel and bladder function are stable in nature as well.  Review of systems: General: No fevers or chills Pulmonary: No shortness of breath or dyspnea Cardiac: No angina or palpitations or lightheadedness GI: No abdominal pain or constipation Psych: No depression    Observations/Objective:  Current Outpatient Medications:    [START ON 02/01/2022] Oxycodone HCl 10 MG TABS, Take 1 tablet (10 mg total) by mouth 3 (three) times daily., Disp: 90 tablet, Rfl: 0   albuterol (PROVENTIL HFA;VENTOLIN HFA) 108 (90 Base) MCG/ACT inhaler, Inhale into the lungs every 6 (six) hours as needed for wheezing or shortness of breath., Disp: , Rfl:    aspirin EC 81 MG tablet, Take 81 mg by mouth daily. Swallow whole., Disp: , Rfl:     Cholecalciferol 25 MCG (1000 UT) tablet, Take by mouth., Disp: , Rfl:    cyclobenzaprine (FLEXERIL) 10 MG tablet, Take by mouth., Disp: , Rfl:    diclofenac Sodium (VOLTAREN) 1 % GEL, Apply 4 g topically 4 (four) times daily., Disp: 100 g, Rfl: 3   escitalopram (LEXAPRO) 10 MG tablet, Take 10 mg by mouth daily., Disp: , Rfl:    folic acid (FOLVITE) 1 MG tablet, Take by mouth., Disp: , Rfl:    ipratropium-albuterol (DUONEB) 0.5-2.5 (3) MG/3ML SOLN, Inhale into the lungs., Disp: , Rfl:    lidocaine (LIDODERM) 5 %, Place 1 patch onto the skin daily., Disp: , Rfl:    naloxone (NARCAN) nasal spray 4 mg/0.1 mL, Place into the nose., Disp: , Rfl:    OLANZapine (ZYPREXA) 5 MG tablet, Take 5 mg by mouth daily. (Patient not taking: Reported on 11/04/2021), Disp: , Rfl:    [START ON 01/03/2022] Oxycodone HCl 10 MG TABS, Take 1 tablet (10 mg total) by mouth every 8 (eight) hours as needed., Disp: 90 tablet, Rfl: 0   pantoprazole (PROTONIX) 40 MG tablet, Take 40 mg by mouth daily., Disp: , Rfl:    solifenacin (VESICARE) 5 MG tablet, Take by mouth., Disp: , Rfl:    vitamin B-12 (CYANOCOBALAMIN) 100 MCG tablet, Take 100 mcg by mouth daily., Disp: , Rfl:    Past Medical History:  Diagnosis Date   Achalasia    Allergy    Arthritis    Depression    Emphysema of lung ([place])    Fibromyalgia  Gastroesophageal reflux disease with esophagitis 01/26/2020   Formatting of this note might be different from the original. LA Grade C noted on EGD 01/2020   GERD (gastroesophageal reflux disease)    Herpes genitalis    History of chicken pox    History of kidney stones      Assessment and Plan: 1. DDD (degenerative disc disease), lumbar   2. DDD (degenerative disc disease), thoracolumbar   3. Primary osteoarthritis of both knees   4. Primary osteoarthritis involving multiple joints   5. Facet syndrome, lumbar   6. Fibromyalgia   7. Chronic, continuous use of opioids   8. Chronic pain syndrome   Based on our  conversation today and her response to therapy I think it is appropriate to continue current medication management.  I have reviewed the [facility] practitioner database information and it is appropriate.  No other changes in her regimen will be initiated.  Continue efforts at stretching strengthening and aerobic conditioning.  We will schedule her for 5-monthreturn to clinic and I have asked that she continue follow-up with her primary care physicians for baseline medical care.  Follow Up Instructions:    I discussed the assessment and treatment plan with the patient. The patient was provided an opportunity to ask questions and all were answered. The patient agreed with the plan and demonstrated an understanding of the instructions.   The patient was advised to call back or seek an in-person evaluation if the symptoms worsen or if the condition fails to improve as anticipated.  I provided 30 minutes of non-face-to-face time during this encounter.   JMolli Barrows MD

## 2022-02-25 ENCOUNTER — Ambulatory Visit: Admission: RE | Admit: 2022-02-25 | Payer: Medicare PPO | Source: Ambulatory Visit

## 2022-03-13 ENCOUNTER — Ambulatory Visit: Payer: Medicare PPO | Attending: Anesthesiology | Admitting: Anesthesiology

## 2022-03-13 DIAGNOSIS — M5135 Other intervertebral disc degeneration, thoracolumbar region: Secondary | ICD-10-CM

## 2022-03-13 DIAGNOSIS — M170 Bilateral primary osteoarthritis of knee: Secondary | ICD-10-CM

## 2022-03-13 DIAGNOSIS — M159 Polyosteoarthritis, unspecified: Secondary | ICD-10-CM | POA: Diagnosis not present

## 2022-03-13 DIAGNOSIS — M47816 Spondylosis without myelopathy or radiculopathy, lumbar region: Secondary | ICD-10-CM | POA: Diagnosis not present

## 2022-03-13 DIAGNOSIS — J449 Chronic obstructive pulmonary disease, unspecified: Secondary | ICD-10-CM

## 2022-03-13 DIAGNOSIS — M5136 Other intervertebral disc degeneration, lumbar region: Secondary | ICD-10-CM | POA: Diagnosis not present

## 2022-03-13 DIAGNOSIS — G894 Chronic pain syndrome: Secondary | ICD-10-CM

## 2022-03-13 DIAGNOSIS — M797 Fibromyalgia: Secondary | ICD-10-CM

## 2022-03-13 DIAGNOSIS — F1190 Opioid use, unspecified, uncomplicated: Secondary | ICD-10-CM

## 2022-03-13 NOTE — Progress Notes (Signed)
I tried multiple times to touch base with Katrina Simon for her teleconference.  The recording noted that this number was no longer in service.  Virtual Visit via Telephone Note  I connected with [name] on 03/14/22 at 12:50 PM EST by telephone and verified that I am speaking with the correct person using two identifiers.  Location: Patient: Home Provider: Pain control center   I discussed the limitations, risks, security and privacy concerns of performing an evaluation and management service by telephone and the availability of in person appointments. I also discussed with the patient that there may be a patient responsible charge related to this service. The patient expressed understanding and agreed to proceed.   History of Present Illness: I was able to catch up with Katrina Simon and her husband who assists her today via telephone.  They were unable to do the video portion of the conference.  Collective effort reveals that He is continue to have back pain leg pain knee pain comparable to what she had in the past.  She still taking her opioid medicines with good success generally getting about 75% relief.  This last for about 4 to 6 hours before she has recurrence of the same quality pain.  Her husband reports that she does have some early onset mild to moderate dementia which makes it difficult to assess some of the pain she is experiencing.  Otherwise, her pain is reported to be without significant change in quality or quantity or distribution.  No change in lower extremity strength function or bowel or bladder function is noted.  Review of systems: General: No fevers or chills Pulmonary: No shortness of breath or dyspnea Cardiac: No angina or palpitations or lightheadedness GI: No abdominal pain or constipation Psych: No depression    Observations/Objective:  Current Outpatient Medications:    Oxycodone HCl 10 MG TABS, Take 1 tablet (10 mg total) by mouth in the morning, at  noon, and at bedtime., Disp: 90 tablet, Rfl: 0   [START ON 04/13/2022] Oxycodone HCl 10 MG TABS, Take 1 tablet (10 mg total) by mouth in the morning, at noon, and at bedtime., Disp: 90 tablet, Rfl: 0   albuterol (PROVENTIL HFA;VENTOLIN HFA) 108 (90 Base) MCG/ACT inhaler, Inhale into the lungs every 6 (six) hours as needed for wheezing or shortness of breath., Disp: , Rfl:    aspirin EC 81 MG tablet, Take 81 mg by mouth daily. Swallow whole., Disp: , Rfl:    Cholecalciferol 25 MCG (1000 UT) tablet, Take by mouth., Disp: , Rfl:    cyclobenzaprine (FLEXERIL) 10 MG tablet, Take by mouth., Disp: , Rfl:    diclofenac Sodium (VOLTAREN) 1 % GEL, Apply 4 g topically 4 (four) times daily., Disp: 100 g, Rfl: 3   escitalopram (LEXAPRO) 10 MG tablet, Take 10 mg by mouth daily., Disp: , Rfl:    folic acid (FOLVITE) 1 MG tablet, Take by mouth., Disp: , Rfl:    ipratropium-albuterol (DUONEB) 0.5-2.5 (3) MG/3ML SOLN, Inhale into the lungs., Disp: , Rfl:    lidocaine (LIDODERM) 5 %, Place 1 patch onto the skin daily., Disp: , Rfl:    naloxone (NARCAN) nasal spray 4 mg/0.1 mL, Place into the nose., Disp: , Rfl:    OLANZapine (ZYPREXA) 5 MG tablet, Take 5 mg by mouth daily. (Patient not taking: Reported on 11/04/2021), Disp: , Rfl:    pantoprazole (PROTONIX) 40 MG tablet, Take 40 mg by mouth daily., Disp: , Rfl:    solifenacin (VESICARE)  5 MG tablet, Take by mouth., Disp: , Rfl:    vitamin B-12 (CYANOCOBALAMIN) 100 MCG tablet, Take 100 mcg by mouth daily., Disp: , Rfl:    Past Medical History:  Diagnosis Date   Achalasia    Allergy    Arthritis    Depression    Emphysema of lung ([place])    Fibromyalgia    Gastroesophageal reflux disease with esophagitis 01/26/2020   Formatting of this note might be different from the original. LA Grade C noted on EGD 01/2020   GERD (gastroesophageal reflux disease)    Herpes genitalis    History of chicken pox    History of kidney stones      Assessment and Plan: 1. DDD  (degenerative disc disease), lumbar   2. DDD (degenerative disc disease), thoracolumbar   3. Primary osteoarthritis of both knees   4. Primary osteoarthritis involving multiple joints   5. Facet syndrome, lumbar   6. Fibromyalgia   7. Chronic, continuous use of opioids   8. Chronic pain syndrome   9. CAFL (chronic airflow limitation) (HCC)   Based on our conversation today and upon review of the [facility] practitioner database information it is appropriate to refill her medicines for the next 2 months.  This will be deferred today's date and for 1 month from now.  No other changes in her regimen will be initiated.  I was unable to return to clinic for an in person visit in 2 months.  I want her to continue stretching strengthening activities as reviewed with the patient and her husband and elected to continue follow-up with her primary care physician for baseline medical care with return to clinic as mentioned.  Follow Up Instructions:    I discussed the assessment and treatment plan with the patient. The patient was provided an opportunity to ask questions and all were answered. The patient agreed with the plan and demonstrated an understanding of the instructions.   The patient was advised to call back or seek an in-person evaluation if the symptoms worsen or if the condition fails to improve as anticipated.  I provided 30 minutes of non-face-to-face time during this encounter.   Molli Barrows, MD

## 2022-03-14 ENCOUNTER — Encounter: Payer: Self-pay | Admitting: Anesthesiology

## 2022-03-14 MED ORDER — OXYCODONE HCL 10 MG PO TABS: 10.0000 mg | ORAL_TABLET | Freq: Three times a day (TID) | ORAL | 0 refills | Status: DC

## 2022-03-14 MED ORDER — OXYCODONE HCL 10 MG PO TABS: 10.0000 mg | ORAL_TABLET | Freq: Three times a day (TID) | ORAL | 0 refills | Status: AC

## 2022-03-21 ENCOUNTER — Other Ambulatory Visit: Payer: Self-pay | Admitting: Anesthesiology

## 2022-04-13 ENCOUNTER — Other Ambulatory Visit: Payer: Self-pay

## 2022-04-13 ENCOUNTER — Encounter: Payer: Self-pay | Admitting: Emergency Medicine

## 2022-04-13 ENCOUNTER — Other Ambulatory Visit: Payer: Self-pay | Admitting: Radiology

## 2022-04-13 DIAGNOSIS — W06XXXA Fall from bed, initial encounter: Secondary | ICD-10-CM | POA: Diagnosis not present

## 2022-04-13 DIAGNOSIS — M542 Cervicalgia: Secondary | ICD-10-CM | POA: Insufficient documentation

## 2022-04-13 DIAGNOSIS — R7309 Other abnormal glucose: Secondary | ICD-10-CM | POA: Diagnosis not present

## 2022-04-13 DIAGNOSIS — S0101XA Laceration without foreign body of scalp, initial encounter: Secondary | ICD-10-CM | POA: Diagnosis not present

## 2022-04-13 DIAGNOSIS — S0990XA Unspecified injury of head, initial encounter: Secondary | ICD-10-CM | POA: Diagnosis present

## 2022-04-13 NOTE — ED Triage Notes (Signed)
Pt to ED via POV. Per son pt does not remember the fall. Pt with dried blood noted to posterior head at time of triage. Pt visualized in NAD at this time.   Pt's son reports pt is at baseline cognitively, pt noted to be slow to answer questions, some confusion noted at this time.

## 2022-04-14 ENCOUNTER — Emergency Department: Payer: Medicare PPO

## 2022-04-14 ENCOUNTER — Emergency Department
Admission: EM | Admit: 2022-04-14 | Discharge: 2022-04-14 | Disposition: A | Discharge status: Home / Self Care | Payer: Medicare PPO | Attending: Emergency Medicine | Admitting: Emergency Medicine

## 2022-04-14 ENCOUNTER — Other Ambulatory Visit: Payer: Medicare PPO

## 2022-04-14 DIAGNOSIS — S0101XA Laceration without foreign body of scalp, initial encounter: Secondary | ICD-10-CM

## 2022-04-14 DIAGNOSIS — W19XXXA Unspecified fall, initial encounter: Secondary | ICD-10-CM

## 2022-04-14 LAB — BASIC METABOLIC PANEL
Anion gap: 9 (ref 5–15)
BUN: 20 mg/dL (ref 8–23)
CO2: 28 mmol/L (ref 22–32)
Calcium: 9.1 mg/dL (ref 8.9–10.3)
Chloride: 103 mmol/L (ref 98–111)
Creatinine, Ser: 0.96 mg/dL (ref 0.44–1.00)
GFR, Estimated: >60 mL/min (ref >60)
Glucose, Bld: 137 mg/dL — ABNORMAL HIGH (ref 70–99)
Potassium: 3.6 mmol/L (ref 3.5–5.1)
Sodium: 140 mmol/L (ref 135–145)

## 2022-04-14 LAB — CBC
HCT: 34.7 % — ABNORMAL LOW (ref 36.0–46.0)
Hemoglobin: 11.4 g/dL — ABNORMAL LOW (ref 12.0–15.0)
MCH: 29.6 pg (ref 26.0–34.0)
MCHC: 32.9 g/dL (ref 30.0–36.0)
MCV: 90.1 fL (ref 80.0–100.0)
Platelets: 247 10*3/uL (ref 150–400)
RBC: 3.85 MIL/uL — ABNORMAL LOW (ref 3.87–5.11)
RDW: 13.3 % (ref 11.5–15.5)
WBC: 5.9 10*3/uL (ref 4.0–10.5)
nRBC: 0 % (ref 0.0–0.2)

## 2022-04-14 LAB — CBG MONITORING, ED: Glucose-Capillary: 132 mg/dL — ABNORMAL HIGH (ref 70–99)

## 2022-04-14 MED ORDER — LIDOCAINE HCL (PF) 1 % IJ SOLN
5.0000 mL | Freq: Once | INTRAMUSCULAR | Status: AC
  Administered 2022-04-14: 5 mL via INTRADERMAL
  Filled 2022-04-14: qty 5

## 2022-04-14 NOTE — ED Provider Notes (Signed)
[facility] Provider Note    Event Date/Time   First MD Initiated Contact with Patient 04/14/22 [phone]     (approximate)   History   Fall   HPI  OLIVEAH ZWACK is a 72 y.o. female  who presents to the emergency department today after a fall. History is primarily obtained from son at bedside. Patient apparently fell out of bed. He states she does have some wooden steps on the side of her bed which he is worried she hit.  The patient does not remember the fall.  Was complaining of some headache and neck pain. No recent fevers or illness.     Physical Exam   Triage Vital Signs: ED Triage Vitals [04/13/22 2352]  Enc Vitals Group     BP 125/79     Pulse Rate 75     Resp 18     Temp 97.8 F (36.6 C)     Temp Source Oral     SpO2 98 %     Weight 103 lb 6.3 oz (46.9 kg)     Height '5\' 5"'$  (1.651 m)     Head Circumference      Peak Flow      Pain Score      Pain Loc      Pain Edu?      Excl. in [place]?     Most recent vital signs: Vitals:   04/13/22 2352  BP: 125/79  Pulse: 75  Resp: 18  Temp: 97.8 F (36.6 C)  SpO2: 98%   General: Awake, no distress.  CV:  Good peripheral perfusion. Regular rate and rhythm. Resp:  Normal effort. Lungs clear. Abd:  No distention.  Skin:  2.5 cm laceration to occiput MSK:  No cervical spine tenderness  ED Results / Procedures / Treatments   Labs (all labs ordered are listed, but only abnormal results are displayed) Labs Reviewed  BASIC METABOLIC PANEL - Abnormal; Notable for the following components:      Result Value   Glucose, Bld 137 (*)    All other components within normal limits  CBC - Abnormal; Notable for the following components:   RBC 3.85 (*)    Hemoglobin 11.4 (*)    HCT 34.7 (*)    All other components within normal limits  CBG MONITORING, ED - Abnormal; Notable for the following components:   Glucose-Capillary 132 (*)    All other components within normal limits  URINALYSIS, ROUTINE W  REFLEX MICROSCOPIC     EKG  I, Nance Pear, attending physician, personally viewed and interpreted this EKG  EKG Time: 2355 Rate: 77 Rhythm: normal sinus rhythm Axis: normal Intervals: qtc 450 QRS: narrow, q waves v1, v3 ST changes: no st elevation Impression: abnormal ekg    RADIOLOGY I independently interpreted and visualized the ct head/cervical spine. My interpretation: No acute bleed/fracture Radiology interpretation:  IMPRESSION:  Mild soft tissue irregularity overlying the left occiput. No  evidence of calvarial fracture. Small vessel ischemic changes.    Mild superior endplate compression fracture deformity at C7, age  indeterminate, likely chronic. No retropulsion. No acute traumatic  injury to the cervical spine.      PROCEDURES:  Critical Care performed: No  Procedures  LACERATION REPAIR Performed by: Nance Pear Authorized by: Nance Pear Consent: Verbal consent obtained. Risks and benefits: risks, benefits and alternatives were discussed Consent given by: patient Patient identity confirmed: provided demographic data Prepped and Draped in normal sterile fashion Wound explored  Laceration Location: occiput  Laceration Length: 2.5 cm  No Foreign Bodies seen or palpated  Anesthesia: local infiltration  Local anesthetic: lidocaine 1% without epinephrine  Anesthetic total: 2 ml  Irrigation method: syringe Amount of cleaning: standard  Skin closure: staples  Number of staples: 7  Patient tolerance: Patient tolerated the procedure well with no immediate complications.   MEDICATIONS ORDERED IN ED: Medications - No data to display   IMPRESSION / MDM / [place] / ED COURSE  I reviewed the triage vital signs and the nursing notes.                              Differential diagnosis includes, but is not limited to, intracranial bleed, fracture.  Patient's presentation is most consistent with acute presentation  with potential threat to life or bodily function.  Patient presented to the emergency department today because of concerns for a fall and laceration.  She did have a 2.5 cm laceration to the occiput.  This was closed with sutures.  CT head without any concerning intracranial bleed.  CT cervical spine showed vertebral compression fracture of C7 thought to be chronic.  Patient is not tender over that area today.  I do think this is likely a chronic finding.  Patient's blood work without concerning anemia or electrolyte abnormality.  Will plan on discharging.  Discussed infection return precautions.   FINAL CLINICAL IMPRESSION(S) / ED DIAGNOSES   Final diagnoses:  Fall, initial encounter  Laceration of scalp, initial encounter    Note:  This document was prepared using Dragon voice recognition software and may include unintentional dictation errors.    Nance Pear, MD 04/14/22 [place]

## 2022-04-14 NOTE — Discharge Instructions (Signed)
Please have the staples removed in 7-10 days. This can be done at primary care. Please seek medical attention for any high fevers, chest pain, shortness of breath, change in behavior, persistent vomiting, bloody stool or any other new or concerning symptoms.

## 2022-04-14 NOTE — ED Notes (Signed)
Patient tolerated staples well by MD. Patient assisted into wheelchair and into car with responsible driver. Discharge instructions explained to patient and family at this time. Patient and family state they understand and agree.

## 2022-04-28 ENCOUNTER — Ambulatory Visit: Payer: Medicare PPO | Attending: Anesthesiology | Admitting: Anesthesiology

## 2022-04-28 ENCOUNTER — Encounter: Payer: Self-pay | Admitting: Anesthesiology

## 2022-04-28 DIAGNOSIS — M797 Fibromyalgia: Secondary | ICD-10-CM

## 2022-04-28 DIAGNOSIS — G309 Alzheimer's disease, unspecified: Secondary | ICD-10-CM

## 2022-04-28 DIAGNOSIS — M170 Bilateral primary osteoarthritis of knee: Secondary | ICD-10-CM | POA: Diagnosis not present

## 2022-04-28 DIAGNOSIS — G894 Chronic pain syndrome: Secondary | ICD-10-CM

## 2022-04-28 DIAGNOSIS — M5135 Other intervertebral disc degeneration, thoracolumbar region: Secondary | ICD-10-CM

## 2022-04-28 DIAGNOSIS — M47816 Spondylosis without myelopathy or radiculopathy, lumbar region: Secondary | ICD-10-CM | POA: Diagnosis not present

## 2022-04-28 DIAGNOSIS — J449 Chronic obstructive pulmonary disease, unspecified: Secondary | ICD-10-CM

## 2022-04-28 DIAGNOSIS — M5136 Other intervertebral disc degeneration, lumbar region: Secondary | ICD-10-CM | POA: Diagnosis not present

## 2022-04-28 DIAGNOSIS — F1190 Opioid use, unspecified, uncomplicated: Secondary | ICD-10-CM

## 2022-04-28 DIAGNOSIS — M159 Polyosteoarthritis, unspecified: Secondary | ICD-10-CM

## 2022-04-28 DIAGNOSIS — F0150 Vascular dementia without behavioral disturbance: Secondary | ICD-10-CM

## 2022-04-28 MED ORDER — OXYCODONE HCL 10 MG PO TABS: 10.0000 mg | ORAL_TABLET | Freq: Three times a day (TID) | ORAL | 0 refills | Status: AC

## 2022-04-28 MED ORDER — OXYCODONE HCL 10 MG PO TABS: 10.0000 mg | ORAL_TABLET | Freq: Three times a day (TID) | ORAL | 0 refills | Status: DC

## 2022-04-28 NOTE — Progress Notes (Signed)
Virtual Visit via Telephone Note  I connected with Katrina Simon on 04/28/22 at  3:15 PM EST by telephone and verified that I am speaking with the correct person using two identifiers.  Location: Patient: Home Provider: Pain control center   I discussed the limitations, risks, security and privacy concerns of performing an evaluation and management service by telephone and the availability of in person appointments. I also discussed with the patient that there may be a patient responsible charge related to this service. The patient expressed understanding and agreed to proceed.   History of Present Illness: I spoke with Katrina Simon and her husband via telephone as we were unable like for the video portion conference.  He reports that she is having a considerable amount of low back pain hip pain and leg pain comparable to what she is experienced in the past.  Unfortunately her mental capacity has deteriorated over the last several months to years.  She has been followed by neurology with a diagnosis of mixed dementia with Alzheimer component.  Furthermore, she did recently sustain a fall and hit the back of her head.  In speaking with her husband he attributes this to some of her other medications including the Seroquel and sedatives that they have used to assist in her care.  She has been on opioids chronically.  She generally averages about 2-3 oxycodone per day without side effect.  He has recently retired to stay at home and take care of her full-time.  The quality characteristic and distribution of her pain are reportedly stable with no recent changes in lower extremity strength or function or bowel or bladder function.  They maintain that she does not have any side effects from the medicine such as sedation or balance dysfunction.   Observations/Objective:  Current Outpatient Medications:    [START ON 06/13/2022] Oxycodone HCl 10 MG TABS, Take 1 tablet (10 mg total) by mouth in the morning, at  noon, and at bedtime., Disp: 75 tablet, Rfl: 0   albuterol (PROVENTIL HFA;VENTOLIN HFA) 108 (90 Base) MCG/ACT inhaler, Inhale into the lungs every 6 (six) hours as needed for wheezing or shortness of breath., Disp: , Rfl:    aspirin EC 81 MG tablet, Take 81 mg by mouth daily. Swallow whole., Disp: , Rfl:    Cholecalciferol 25 MCG (1000 UT) tablet, Take by mouth., Disp: , Rfl:    diclofenac Sodium (VOLTAREN) 1 % GEL, Apply 4 g topically 4 (four) times daily., Disp: 100 g, Rfl: 3   escitalopram (LEXAPRO) 10 MG tablet, Take 10 mg by mouth daily., Disp: , Rfl:    folic acid (FOLVITE) 1 MG tablet, Take by mouth., Disp: , Rfl:    ipratropium-albuterol (DUONEB) 0.5-2.5 (3) MG/3ML SOLN, Inhale into the lungs., Disp: , Rfl:    lidocaine (LIDODERM) 5 %, Place 1 patch onto the skin daily., Disp: , Rfl:    naloxone (NARCAN) nasal spray 4 mg/0.1 mL, Place into the nose., Disp: , Rfl:    OLANZapine (ZYPREXA) 5 MG tablet, Take 5 mg by mouth daily. (Patient not taking: Reported on 11/04/2021), Disp: , Rfl:    [START ON 05/14/2022] Oxycodone HCl 10 MG TABS, Take 1 tablet (10 mg total) by mouth in the morning, at noon, and at bedtime., Disp: 75 tablet, Rfl: 0   pantoprazole (PROTONIX) 40 MG tablet, Take 40 mg by mouth daily., Disp: , Rfl:    solifenacin (VESICARE) 5 MG tablet, Take by mouth., Disp: , Rfl:    vitamin  B-12 (CYANOCOBALAMIN) 100 MCG tablet, Take 100 mcg by mouth daily., Disp: , Rfl:    Past Medical History:  Diagnosis Date   Achalasia    Allergy    Arthritis    Depression    Emphysema of lung ([place])    Fibromyalgia    Gastroesophageal reflux disease with esophagitis 01/26/2020   Formatting of this note might be different from the original. LA Grade C noted on EGD 01/2020   GERD (gastroesophageal reflux disease)    Herpes genitalis    History of chicken pox    History of kidney stones      Assessment and Plan: 1. DDD (degenerative disc disease), lumbar   2. DDD (degenerative disc disease),  thoracolumbar   3. Primary osteoarthritis of both knees   4. Primary osteoarthritis involving multiple joints   5. Facet syndrome, lumbar   6. Fibromyalgia   7. Chronic, continuous use of opioids   8. Chronic pain syndrome   9. CAFL (chronic airflow limitation) (HCC)   10. Mixed Alzheimer's and vascular dementia ([place])   Is a very difficult situation with Coriana's current care.  She has chronic pain requiring chronic opioid management and has been on this for a extended period of time.  Furthermore her mental status has deteriorated.  At this point the husband has taken on full-time care of her.  He does not feel that the opioids cause a problem and that her other adjunct of neurologic medicines have been at times problematic.  We talked about the risks and benefits of opioids and the potential for sedation balance issues etc. and we have had a good thorough conversation regarding this.  At present based on our conversation we have agreed to keep her on 33 tablets/month using 2 oxycodone tablets on her good days with the ability to utilize 3 on her bad days.  Furthermore he is looking at reducing her Seroquel dose from the 50 mg recent change to 25 to see if this helps with some excess somnolence she is experiencing in the morning.  Refills will be given for February 28 and March 29 with a return to clinic scheduled for 2 months.  At that point we will revisit dosing regimen as we have discussed reducing her to the 7.5 mg dose.  Continue follow-up with neurology and primary care physicians for baseline medical care.  Follow Up Instructions:    I discussed the assessment and treatment plan with the patient. The patient was provided an opportunity to ask questions and all were answered. The patient agreed with the plan and demonstrated an understanding of the instructions.   The patient was advised to call back or seek an in-person evaluation if the symptoms worsen or if the condition fails to improve as  anticipated.  I provided 30 minutes of non-face-to-face time during this encounter.   Molli Barrows, MD

## 2022-05-15 ENCOUNTER — Telehealth: Payer: Self-pay | Admitting: Anesthesiology

## 2022-05-15 NOTE — Telephone Encounter (Signed)
Called husband back and instructed him that I did not see where Dr Andree Elk was lowering medication to 7.'5mg'$ . He is afraid that she will run out of medication since she only got 75 instead of 90 pills. I told him that there could be days that she only needed 2 pills instead of 3 and that I would let Dr Andree Elk know.

## 2022-05-15 NOTE — Telephone Encounter (Signed)
PT husband called stated that the pharmacy only did refill on Oxycodone for 75 pill count however it should have been 90 count. Husband stated that Dr. Andree Elk was supposed to be lowing the dosage to 7.5 Please give patient a call. TY

## 2022-06-09 ENCOUNTER — Other Ambulatory Visit: Payer: Self-pay

## 2022-06-09 ENCOUNTER — Encounter: Payer: Medicare PPO | Attending: Pulmonary Disease

## 2022-06-09 DIAGNOSIS — J449 Chronic obstructive pulmonary disease, unspecified: Secondary | ICD-10-CM | POA: Insufficient documentation

## 2022-06-09 NOTE — Progress Notes (Signed)
Virtual Visit completed. Patient informed on EP and RD appointment and 6 Minute walk test. Patient also informed of patient health questionnaires on My Chart. Patient Verbalizes understanding. Visit diagnosis can be found in [facility] 05/06/2022. 

## 2022-06-11 ENCOUNTER — Encounter: Payer: Medicare PPO | Admitting: *Deleted

## 2022-06-11 VITALS — Ht 64.25 in | Wt 118.0 lb

## 2022-06-11 DIAGNOSIS — J449 Chronic obstructive pulmonary disease, unspecified: Secondary | ICD-10-CM | POA: Diagnosis present

## 2022-06-11 NOTE — Patient Instructions (Signed)
Patient Instructions  Patient Details  Name: Katrina Simon MRN: [id number] Date of Birth: 07/30/1950 Referring Provider:  Ottie Glazier, MD  Below are your personal goals for exercise, nutrition, and risk factors. Our goal is to help you stay on track towards obtaining and maintaining these goals. We will be discussing your progress on these goals with you throughout the program.  Initial Exercise Prescription:  Initial Exercise Prescription - 06/11/22 1600       Date of Initial Exercise RX and Referring Provider   Date 06/11/22    Referring Provider Aleskerov      Oxygen   Maintain Oxygen Saturation 88% or higher      NuStep   Level 1    SPM 80    Minutes 15    METs 1.7      Biostep-RELP   Level 1    SPM 50    Minutes 15    METs 1.7      Track   Laps 10    Minutes 15    METs 1.54      Prescription Details   Frequency (times per week) 2    Duration Progress to 30 minutes of continuous aerobic without signs/symptoms of physical distress      Intensity   THRR 40-80% of Max Heartrate 112-137    Ratings of Perceived Exertion 11-13    Perceived Dyspnea 0-4      Progression   Progression Continue to progress workloads to maintain intensity without signs/symptoms of physical distress.      Resistance Training   Training Prescription Yes    Weight 1    Reps 10-15             Exercise Goals: Frequency: Be able to perform aerobic exercise two to three times per week in program working toward 2-5 days per week of home exercise.  Intensity: Work with a perceived exertion of 11 (fairly light) - 15 (hard) while following your exercise prescription.  We will make changes to your prescription with you as you progress through the program.   Duration: Be able to do 30 to 45 minutes of continuous aerobic exercise in addition to a 5 minute warm-up and a 5 minute cool-down routine.   Nutrition Goals: Your personal nutrition goals will be established when you do your  nutrition analysis with the dietician.  The following are general nutrition guidelines to follow: Cholesterol < 200mg /day Sodium < 1500mg /day Fiber: Women over 50 yrs - 21 grams per day  Personal Goals:  Personal Goals and Risk Factors at Admission - 06/11/22 1619       Core Components/Risk Factors/Patient Goals on Admission    Weight Management Yes;Weight Maintenance    Intervention Weight Management: Develop a combined nutrition and exercise program designed to reach desired caloric intake, while maintaining appropriate intake of nutrient and fiber, sodium and fats, and appropriate energy expenditure required for the weight goal.;Weight Management: Provide education and appropriate resources to help participant work on and attain dietary goals.;Weight Management/Obesity: Establish reasonable short term and long term weight goals.    Admit Weight 118 lb (53.5 kg)    Goal Weight: Short Term 118 lb (53.5 kg)    Goal Weight: Long Term 118 lb (53.5 kg)    Expected Outcomes Short Term: Continue to assess and modify interventions until short term weight is achieved;Long Term: Adherence to nutrition and physical activity/exercise program aimed toward attainment of established weight goal;Weight Maintenance: Understanding of the daily nutrition guidelines, which  includes 25-35% calories from fat, 7% or less cal from saturated fats, less than 200mg  cholesterol, less than 1.5gm of sodium, & 5 or more servings of fruits and vegetables daily;Understanding recommendations for meals to include 15-35% energy as protein, 25-35% energy from fat, 35-60% energy from carbohydrates, less than 200mg  of dietary cholesterol, 20-35 gm of total fiber daily;Understanding of distribution of calorie intake throughout the day with the consumption of 4-5 meals/snacks    Improve shortness of breath with ADL's Yes    Intervention Provide education, individualized exercise plan and daily activity instruction to help decrease  symptoms of SOB with activities of daily living.    Expected Outcomes Short Term: Improve cardiorespiratory fitness to achieve a reduction of symptoms when performing ADLs;Long Term: Be able to perform more ADLs without symptoms or delay the onset of symptoms             Tobacco Use Initial Evaluation: Social History   Tobacco Use  Smoking Status Former   Packs/day: 0.50   Years: 45.00   Additional pack years: 0.00   Total pack years: 22.50   Types: Cigarettes   Quit date: 09/17/2018   Years since quitting: 3.7  Smokeless Tobacco Never    Exercise Goals and Review:  Exercise Goals     Row Name 06/11/22 1615             Exercise Goals   Increase Physical Activity Yes       Intervention Provide advice, education, support and counseling about physical activity/exercise needs.;Develop an individualized exercise prescription for aerobic and resistive training based on initial evaluation findings, risk stratification, comorbidities and participant's personal goals.       Expected Outcomes Short Term: Attend rehab on a regular basis to increase amount of physical activity.;Long Term: Add in home exercise to make exercise part of routine and to increase amount of physical activity.;Long Term: Exercising regularly at least 3-5 days a week.       Increase Strength and Stamina Yes       Intervention Provide advice, education, support and counseling about physical activity/exercise needs.;Develop an individualized exercise prescription for aerobic and resistive training based on initial evaluation findings, risk stratification, comorbidities and participant's personal goals.       Expected Outcomes Short Term: Increase workloads from initial exercise prescription for resistance, speed, and METs.;Short Term: Perform resistance training exercises routinely during rehab and add in resistance training at home;Long Term: Improve cardiorespiratory fitness, muscular endurance and strength as  measured by increased METs and functional capacity (6MWT)       Able to understand and use rate of perceived exertion (RPE) scale Yes       Intervention Provide education and explanation on how to use RPE scale       Expected Outcomes Short Term: Able to use RPE daily in rehab to express subjective intensity level;Long Term:  Able to use RPE to guide intensity level when exercising independently       Able to understand and use Dyspnea scale Yes       Intervention Provide education and explanation on how to use Dyspnea scale       Expected Outcomes Short Term: Able to use Dyspnea scale daily in rehab to express subjective sense of shortness of breath during exertion;Long Term: Able to use Dyspnea scale to guide intensity level when exercising independently       Knowledge and understanding of Target Heart Rate Range (THRR) Yes  Intervention Provide education and explanation of THRR including how the numbers were predicted and where they are located for reference       Expected Outcomes Short Term: Able to state/look up THRR;Long Term: Able to use THRR to govern intensity when exercising independently;Short Term: Able to use daily as guideline for intensity in rehab       Able to check pulse independently Yes       Intervention Provide education and demonstration on how to check pulse in carotid and radial arteries.;Review the importance of being able to check your own pulse for safety during independent exercise       Expected Outcomes Short Term: Able to explain why pulse checking is important during independent exercise;Long Term: Able to check pulse independently and accurately       Understanding of Exercise Prescription Yes       Intervention Provide education, explanation, and written materials on patient's individual exercise prescription       Expected Outcomes Short Term: Able to explain program exercise prescription;Long Term: Able to explain home exercise prescription to exercise  independently                Copy of goals given to participant.

## 2022-06-11 NOTE — Progress Notes (Signed)
Pulmonary Individual Treatment Plan  Patient Details  Name: Katrina Simon MRN: [id number] Date of Birth: 1950-03-25 Referring Provider:   Flowsheet Row Pulmonary Rehab from 06/11/2022 in [facility] Cardiac and Pulmonary Rehab  Referring Provider Aleskerov       Initial Encounter Date:  Flowsheet Row Pulmonary Rehab from 06/11/2022 in [facility] Cardiac and Pulmonary Rehab  Date 06/11/22       Visit Diagnosis: Chronic obstructive pulmonary disease, unspecified COPD type ([place])  Patient's Home Medications on Admission:  Current Outpatient Medications:    albuterol (PROVENTIL HFA;VENTOLIN HFA) 108 (90 Base) MCG/ACT inhaler, Inhale into the lungs every 6 (six) hours as needed for wheezing or shortness of breath., Disp: , Rfl:    aspirin EC 81 MG tablet, Take 81 mg by mouth daily. Swallow whole., Disp: , Rfl:    Cholecalciferol 25 MCG (1000 UT) tablet, Take by mouth., Disp: , Rfl:    ciprofloxacin (CIPRO) 500 MG tablet, Take 500 mg by mouth 2 (two) times daily., Disp: , Rfl:    diclofenac Sodium (VOLTAREN) 1 % GEL, Apply 4 g topically 4 (four) times daily., Disp: 100 g, Rfl: 3   donepezil (ARICEPT) 5 MG tablet, Take by mouth., Disp: , Rfl:    escitalopram (LEXAPRO) 10 MG tablet, Take 10 mg by mouth daily., Disp: , Rfl:    escitalopram (LEXAPRO) 10 MG tablet, Take by mouth. (Patient not taking: Reported on 06/09/2022), Disp: , Rfl:    escitalopram (LEXAPRO) 20 MG tablet, Take by mouth. (Patient not taking: Reported on 06/09/2022), Disp: , Rfl:    FLUoxetine (PROZAC) 20 MG capsule, Take 60 mg by mouth every morning. (Patient not taking: Reported on 06/09/2022), Disp: , Rfl:    folic acid (FOLVITE) 1 MG tablet, Take by mouth., Disp: , Rfl:    ipratropium-albuterol (DUONEB) 0.5-2.5 (3) MG/3ML SOLN, Inhale into the lungs., Disp: , Rfl:    lidocaine (LIDODERM) 5 %, Place 1 patch onto the skin daily., Disp: , Rfl:    memantine (NAMENDA) 5 MG tablet, Take 5 mg by mouth 2 (two) times daily., Disp: , Rfl:     naloxone (NARCAN) nasal spray 4 mg/0.1 mL, Place into the nose., Disp: , Rfl:    nitrofurantoin, macrocrystal-monohydrate, (MACROBID) 100 MG capsule, Take 100 mg by mouth 2 (two) times daily., Disp: , Rfl:    OLANZapine (ZYPREXA) 5 MG tablet, Take 5 mg by mouth daily. (Patient not taking: Reported on 11/04/2021), Disp: , Rfl:    Oxycodone HCl 10 MG TABS, Take 1 tablet (10 mg total) by mouth in the morning, at noon, and at bedtime. (Patient not taking: Reported on 06/09/2022), Disp: 75 tablet, Rfl: 0   [START ON 06/13/2022] Oxycodone HCl 10 MG TABS, Take 1 tablet (10 mg total) by mouth in the morning, at noon, and at bedtime., Disp: 75 tablet, Rfl: 0   pantoprazole (PROTONIX) 40 MG tablet, Take 40 mg by mouth daily., Disp: , Rfl:    QUEtiapine (SEROQUEL) 25 MG tablet, Take 25 mg by mouth at bedtime as needed. (Patient not taking: Reported on 06/09/2022), Disp: , Rfl:    QUEtiapine (SEROQUEL) 50 MG tablet, Take by mouth. (Patient not taking: Reported on 06/09/2022), Disp: , Rfl:    solifenacin (VESICARE) 10 MG tablet, Take 10 mg by mouth daily. (Patient not taking: Reported on 06/09/2022), Disp: , Rfl:    solifenacin (VESICARE) 5 MG tablet, Take by mouth. (Patient not taking: Reported on 06/09/2022), Disp: , Rfl:    solifenacin (VESICARE) 5 MG tablet, Take  by mouth. (Patient not taking: Reported on 06/09/2022), Disp: , Rfl:    sulfamethoxazole-trimethoprim (BACTRIM DS) 800-160 MG tablet, Take 1 tablet by mouth 2 (two) times daily. (Patient not taking: Reported on 06/09/2022), Disp: , Rfl:    vitamin B-12 (CYANOCOBALAMIN) 100 MCG tablet, Take 100 mcg by mouth daily., Disp: , Rfl:   Past Medical History: Past Medical History:  Diagnosis Date   Achalasia    Allergy    Arthritis    Depression    Emphysema of lung ([place])    Fibromyalgia    Gastroesophageal reflux disease with esophagitis 01/26/2020   Formatting of this note might be different from the original. LA Grade C noted on EGD 01/2020   GERD  (gastroesophageal reflux disease)    Herpes genitalis    History of chicken pox    History of kidney stones     Tobacco Use: Social History   Tobacco Use  Smoking Status Former   Packs/day: 0.50   Years: 45.00   Additional pack years: 0.00   Total pack years: 22.50   Types: Cigarettes   Quit date: 09/17/2018   Years since quitting: 3.7  Smokeless Tobacco Never    Labs: Review Flowsheet       Latest Ref Rng & Units 07/26/2020 06/20/2021  Labs for ITP Cardiac and Pulmonary Rehab  Bicarbonate 20.0 - 28.0 mmol/L 29.8  22.6   Acid-base deficit 0.0 - 2.0 mmol/L - 5.5   O2 Saturation % 96.5  93.4      Pulmonary Assessment Scores:  Pulmonary Assessment Scores     Row Name 06/11/22 1620         ADL UCSD   ADL Phase Entry     SOB Score total --  partially completed. unable to complete form in full due to lack of understanding (dementia)     Rest 1     Walk 1     Stairs 1     Bath 1     Dress 0     Shop 0       CAT Score   CAT Score 10       mMRC Score   mMRC Score 2              UCSD: Self-administered rating of dyspnea associated with activities of daily living (ADLs) 6-point scale (0 = "not at all" to 5 = "maximal or unable to do because of breathlessness")  Scoring Scores range from 0 to 120.  Minimally important difference is 5 units  CAT: CAT can identify the health impairment of COPD patients and is better correlated with disease progression.  CAT has a scoring range of zero to 40. The CAT score is classified into four groups of low (less than 10), medium (10 - 20), high (21-30) and very high (31-40) based on the impact level of disease on health status. A CAT score over 10 suggests significant symptoms.  A worsening CAT score could be explained by an exacerbation, poor medication adherence, poor inhaler technique, or progression of COPD or comorbid conditions.  CAT MCID is 2 points  mMRC: mMRC (Modified Medical Research Council) Dyspnea Scale is used to  assess the degree of baseline functional disability in patients of respiratory disease due to dyspnea. No minimal important difference is established. A decrease in score of 1 point or greater is considered a positive change.   Pulmonary Function Assessment:  Pulmonary Function Assessment - 06/09/22 1115       Breath  Shortness of Breath Yes;Limiting activity             Exercise Target Goals: Exercise Program Goal: Individual exercise prescription set using results from initial 6 min walk test and THRR while considering  patient's activity barriers and safety.   Exercise Prescription Goal: Initial exercise prescription builds to 30-45 minutes a day of aerobic activity, 2-3 days per week.  Home exercise guidelines will be given to patient during program as part of exercise prescription that the participant will acknowledge.  Education: Aerobic Exercise: - Group verbal and visual presentation on the components of exercise prescription. Introduces F.I.T.T principle from ACSM for exercise prescriptions.  Reviews F.I.T.T. principles of aerobic exercise including progression. Written material given at graduation.   Education: Resistance Exercise: - Group verbal and visual presentation on the components of exercise prescription. Introduces F.I.T.T principle from ACSM for exercise prescriptions  Reviews F.I.T.T. principles of resistance exercise including progression. Written material given at graduation.    Education: Exercise & Equipment Safety: - Individual verbal instruction and demonstration of equipment use and safety with use of the equipment. Flowsheet Row Pulmonary Rehab from 06/11/2022 in [facility] Cardiac and Pulmonary Rehab  Date 06/11/22  Educator [facility]  Instruction Review Code 1- Verbalizes Understanding       Education: Exercise Physiology & General Exercise Guidelines: - Group verbal and written instruction with models to review the exercise physiology of the cardiovascular  system and associated critical values. Provides general exercise guidelines with specific guidelines to those with heart or lung disease.    Education: Flexibility, Balance, Mind/Body Relaxation: - Group verbal and visual presentation with interactive activity on the components of exercise prescription. Introduces F.I.T.T principle from ACSM for exercise prescriptions. Reviews F.I.T.T. principles of flexibility and balance exercise training including progression. Also discusses the mind body connection.  Reviews various relaxation techniques to help reduce and manage stress (i.e. Deep breathing, progressive muscle relaxation, and visualization). Balance handout provided to take home. Written material given at graduation.   Activity Barriers & Risk Stratification:  Activity Barriers & Cardiac Risk Stratification - 06/11/22 1609       Activity Barriers & Cardiac Risk Stratification   Activity Barriers History of Falls;Other (comment)    Comments Dementia             6 Minute Walk:  6 Minute Walk     Row Name 06/11/22 1604         6 Minute Walk   Phase Initial     Distance 420 feet     Walk Time 6 minutes     # of Rest Breaks 0     MPH 0.8     METS 1.7     RPE 11     Perceived Dyspnea  0     VO2 Peak 6.03     Symptoms No     Resting HR 87 bpm     Resting BP 122/70     Resting Oxygen Saturation  97 %     Exercise Oxygen Saturation  during 6 min walk 88 %     Max Ex. HR 94 bpm     Max Ex. BP 142/82     2 Minute Post BP 122/80       Interval HR   1 Minute HR 89     2 Minute HR 94     3 Minute HR 89     4 Minute HR 94     5 Minute HR 94  6 Minute HR 92     2 Minute Post HR 84     Interval Heart Rate? Yes       Interval Oxygen   Interval Oxygen? Yes     Baseline Oxygen Saturation % 97 %     1 Minute Oxygen Saturation % 96 %     1 Minute Liters of Oxygen 0 L     2 Minute Oxygen Saturation % 91 %     2 Minute Liters of Oxygen 0 L     3 Minute Oxygen Saturation  % 88 %     3 Minute Liters of Oxygen 0 L     4 Minute Oxygen Saturation % 88 %     4 Minute Liters of Oxygen 0 L     5 Minute Oxygen Saturation % 92 %     5 Minute Liters of Oxygen 0 L     6 Minute Oxygen Saturation % 94 %     6 Minute Liters of Oxygen 0 L     2 Minute Post Oxygen Saturation % 96 %     2 Minute Post Liters of Oxygen 0 L             Oxygen Initial Assessment:  Oxygen Initial Assessment - 06/11/22 1620       Home Oxygen   Home Oxygen Device None    Sleep Oxygen Prescription None    Home Exercise Oxygen Prescription None    Home Resting Oxygen Prescription None      Initial 6 min Walk   Oxygen Used None      Program Oxygen Prescription   Program Oxygen Prescription None      Intervention   Short Term Goals To learn and exhibit compliance with exercise, home and travel O2 prescription;To learn and understand importance of maintaining oxygen saturations>88%;To learn and demonstrate proper use of respiratory medications;To learn and demonstrate proper pursed lip breathing techniques or other breathing techniques. ;To learn and understand importance of monitoring SPO2 with pulse oximeter and demonstrate accurate use of the pulse oximeter.    Long  Term Goals Verbalizes importance of monitoring SPO2 with pulse oximeter and return demonstration;Exhibits proper breathing techniques, such as pursed lip breathing or other method taught during program session;Demonstrates proper use of MDI's;Compliance with respiratory medication;Maintenance of O2 saturations>88%;Exhibits compliance with exercise, home  and travel O2 prescription             Oxygen Re-Evaluation:   Oxygen Discharge (Final Oxygen Re-Evaluation):   Initial Exercise Prescription:  Initial Exercise Prescription - 06/11/22 1600       Date of Initial Exercise RX and Referring Provider   Date 06/11/22    Referring Provider Aleskerov      Oxygen   Maintain Oxygen Saturation 88% or higher       NuStep   Level 1    SPM 80    Minutes 15    METs 1.7      Biostep-RELP   Level 1    SPM 50    Minutes 15    METs 1.7      Track   Laps 10    Minutes 15    METs 1.54      Prescription Details   Frequency (times per week) 2    Duration Progress to 30 minutes of continuous aerobic without signs/symptoms of physical distress      Intensity   THRR 40-80% of Max Heartrate 112-137    Ratings  of Perceived Exertion 11-13    Perceived Dyspnea 0-4      Progression   Progression Continue to progress workloads to maintain intensity without signs/symptoms of physical distress.      Resistance Training   Training Prescription Yes    Weight 1    Reps 10-15             Perform Capillary Blood Glucose checks as needed.  Exercise Prescription Changes:   Exercise Prescription Changes     Row Name 06/11/22 1600             Response to Exercise   Blood Pressure (Admit) 122/70       Blood Pressure (Exercise) 142/82       Blood Pressure (Exit) 122/80       Heart Rate (Admit) 87 bpm       Heart Rate (Exercise) 94 bpm       Heart Rate (Exit) 84 bpm       Oxygen Saturation (Admit) 97 %       Oxygen Saturation (Exercise) 88 %       Oxygen Saturation (Exit) 96 %       Rating of Perceived Exertion (Exercise) 11       Perceived Dyspnea (Exercise) 1       Symptoms none       Comments 6 MWT results                Exercise Comments:   Exercise Goals and Review:   Exercise Goals     Row Name 06/11/22 1615             Exercise Goals   Increase Physical Activity Yes       Intervention Provide advice, education, support and counseling about physical activity/exercise needs.;Develop an individualized exercise prescription for aerobic and resistive training based on initial evaluation findings, risk stratification, comorbidities and participant's personal goals.       Expected Outcomes Short Term: Attend rehab on a regular basis to increase amount of physical  activity.;Long Term: Add in home exercise to make exercise part of routine and to increase amount of physical activity.;Long Term: Exercising regularly at least 3-5 days a week.       Increase Strength and Stamina Yes       Intervention Provide advice, education, support and counseling about physical activity/exercise needs.;Develop an individualized exercise prescription for aerobic and resistive training based on initial evaluation findings, risk stratification, comorbidities and participant's personal goals.       Expected Outcomes Short Term: Increase workloads from initial exercise prescription for resistance, speed, and METs.;Short Term: Perform resistance training exercises routinely during rehab and add in resistance training at home;Long Term: Improve cardiorespiratory fitness, muscular endurance and strength as measured by increased METs and functional capacity (6MWT)       Able to understand and use rate of perceived exertion (RPE) scale Yes       Intervention Provide education and explanation on how to use RPE scale       Expected Outcomes Short Term: Able to use RPE daily in rehab to express subjective intensity level;Long Term:  Able to use RPE to guide intensity level when exercising independently       Able to understand and use Dyspnea scale Yes       Intervention Provide education and explanation on how to use Dyspnea scale       Expected Outcomes Short Term: Able to use Dyspnea scale daily in rehab  to express subjective sense of shortness of breath during exertion;Long Term: Able to use Dyspnea scale to guide intensity level when exercising independently       Knowledge and understanding of Target Heart Rate Range (THRR) Yes       Intervention Provide education and explanation of THRR including how the numbers were predicted and where they are located for reference       Expected Outcomes Short Term: Able to state/look up THRR;Long Term: Able to use THRR to govern intensity when  exercising independently;Short Term: Able to use daily as guideline for intensity in rehab       Able to check pulse independently Yes       Intervention Provide education and demonstration on how to check pulse in carotid and radial arteries.;Review the importance of being able to check your own pulse for safety during independent exercise       Expected Outcomes Short Term: Able to explain why pulse checking is important during independent exercise;Long Term: Able to check pulse independently and accurately       Understanding of Exercise Prescription Yes       Intervention Provide education, explanation, and written materials on patient's individual exercise prescription       Expected Outcomes Short Term: Able to explain program exercise prescription;Long Term: Able to explain home exercise prescription to exercise independently                Exercise Goals Re-Evaluation :   Discharge Exercise Prescription (Final Exercise Prescription Changes):  Exercise Prescription Changes - 06/11/22 1600       Response to Exercise   Blood Pressure (Admit) 122/70    Blood Pressure (Exercise) 142/82    Blood Pressure (Exit) 122/80    Heart Rate (Admit) 87 bpm    Heart Rate (Exercise) 94 bpm    Heart Rate (Exit) 84 bpm    Oxygen Saturation (Admit) 97 %    Oxygen Saturation (Exercise) 88 %    Oxygen Saturation (Exit) 96 %    Rating of Perceived Exertion (Exercise) 11    Perceived Dyspnea (Exercise) 1    Symptoms none    Comments 6 MWT results             Nutrition:  Target Goals: Understanding of nutrition guidelines, daily intake of sodium 1500mg , cholesterol 200mg , calories 30% from fat and 7% or less from saturated fats, daily to have 5 or more servings of fruits and vegetables.  Education: All About Nutrition: -Group instruction provided by verbal, written material, interactive activities, discussions, models, and posters to present general guidelines for heart healthy  nutrition including fat, fiber, MyPlate, the role of sodium in heart healthy nutrition, utilization of the nutrition label, and utilization of this knowledge for meal planning. Follow up email sent as well. Written material given at graduation.   Biometrics:  Pre Biometrics - 06/11/22 1616       Pre Biometrics   Height 5' 4.25" (1.632 m)    Weight 118 lb (53.5 kg)    Waist Circumference 30 inches    Hip Circumference 37 inches    Waist to Hip Ratio 0.81 %    BMI (Calculated) 20.1    Single Leg Stand --   did not attempt to do due to lack of understanding of instructions (Dementia)             Nutrition Therapy Plan and Nutrition Goals:  Nutrition Therapy & Goals - 06/11/22 1526  Nutrition Therapy   Diet Heart healthy, low Na, COPD MNT    Protein (specify units) 75g    Fiber 25 grams    Whole Grain Foods 3 servings    Saturated Fats 16 max. grams    Fruits and Vegetables 8 servings/day    Sodium 2 grams      Personal Nutrition Goals   Comments 72 y.o. F admitted to pulmonary rehab for COPD. PMHx includes moderate malnutrition (06/2021) - loss of ~60 lbs, CKD stg 3, DDD, anxiety/depression, mixed dementia. 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 He reports she is around her UBW and that she is doing better; he also reports that her appetite has improved over the past couple of months. B: ham and cheese croissant or ham and cheese biscuit L: hamburger or hot dog D: go out to eat hamburger or pizza- he is trying to cook more at home like chicken. Drinks:dr pepper and banana and pineapple milkshakes at cook out. They have tried flavored water before. She does not miss her meals and does have a lemon pound cake as well as ice cream. Suggested easy meals like rotisserie chicken, microwave grains or batch prepped sweet potatoes, and frozen vegetables. Recommended adding in more nutrient dense foods that she likes into her routine like  oatmeal with peanut butter or protein powder or egg whites, sweet potatoes with unsweeteed yogurt or white beans mashed in, yogurt with fruit, fruit with peanut butter, core power shake (she does not like many supplements as she has been getting bored, but she likes milkshakes), rice with added beans or lentils, etc. Discussed heart healthy eating, high kcal/protein nutrition therapy and pulmonary MNT.      Intervention Plan   Intervention Prescribe, educate and counsel regarding individualized specific dietary modifications aiming towards targeted core components such as weight, hypertension, lipid management, diabetes, heart failure and other comorbidities.;Nutrition handout(s) given to patient.    Expected Outcomes Short Term Goal: Understand basic principles of dietary content, such as calories, fat, sodium, cholesterol and nutrients.;Short Term Goal: A plan has been developed with personal nutrition goals set during dietitian appointment.;Long Term Goal: Adherence to prescribed nutrition plan.             Nutrition Assessments:  MEDIFICTS Score Key: ?70 Need to make dietary changes  40-70 Heart Healthy Diet ? 40 Therapeutic Level Cholesterol Diet  Flowsheet Row Pulmonary Rehab from 06/11/2022 in [facility] Cardiac and Pulmonary Rehab  Picture Your Plate Total Score on Admission 50      Picture Your Plate Scores: [id number] Unhealthy dietary pattern with much room for improvement. 41-50 Dietary pattern unlikely to meet recommendations for good health and room for improvement. 51-60 More healthful dietary pattern, with some room for improvement.  >60 Healthy dietary pattern, although there may be some specific behaviors that could be improved.   Nutrition Goals Re-Evaluation:   Nutrition Goals Discharge (Final Nutrition Goals Re-Evaluation):   Psychosocial: Target Goals: Acknowledge presence or absence of significant depression and/or stress, maximize coping skills, provide positive support  system. Participant is able to verbalize types and ability to use techniques and skills needed for reducing stress and depression.   Education: Stress, Anxiety, and Depression - Group verbal and visual presentation to define topics covered.  Reviews how body is impacted by stress, anxiety, and depression.  Also discusses healthy ways to reduce stress and to treat/manage anxiety and depression.  Written material given at graduation.  Education: Sleep Hygiene -Provides group verbal and written instruction about how sleep can affect your health.  Define sleep hygiene, discuss sleep cycles and impact of sleep habits. Review good sleep hygiene tips.    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 Her husband helps her with her medications. Her husband is retired and is with her all the time.      Family Dynamics   Good Support System? Yes      Barriers   Psychosocial barriers to participate in program The patient should benefit from training in stress management and relaxation.      Screening Interventions   Interventions Encouraged to exercise;To provide support and resources with identified psychosocial needs;Provide feedback about the scores to participant    Expected Outcomes Short Term goal: Utilizing psychosocial counselor, staff and physician to assist with identification of specific Stressors or current issues interfering with healing process. Setting desired goal for each stressor or current issue identified.;Long Term Goal: Stressors or current issues are controlled or eliminated.;Short Term goal: Identification and review with participant of any Quality of Life or Depression concerns found by  scoring the questionnaire.;Long Term goal: The participant improves quality of Life and PHQ9 Scores as seen by post scores and/or verbalization of changes             Quality of Life Scores:  Scores of 19 and below usually indicate a poorer quality of life in these areas.  A difference of  2-3 points is a clinically meaningful difference.  A difference of 2-3 points in the total score of the Quality of Life Index has been associated with significant improvement in overall quality of life, self-image, physical symptoms, and general health in studies assessing change in quality of life.  PHQ-9: Review Flowsheet  More data exists      06/11/2022 11/04/2021 06/18/2021 10/04/2015 08/09/2015  Depression screen PHQ 2/9  Decreased Interest 1 0 0 0 0  Down, Depressed, Hopeless 1 0 0 0 0  PHQ - 2 Score 2 0 0 0 0  Altered sleeping 1 - - - -  Tired, decreased energy 2 - - - -  Change in appetite 0 - - - -  Feeling bad or failure about yourself  0 - - - -  Trouble concentrating 1 - - - -  Moving slowly or fidgety/restless 2 - - - -  Suicidal thoughts 0 - - - -  PHQ-9 Score 8 - - - -  Difficult doing work/chores Somewhat difficult - - - -   Interpretation of Total Score  Total Score Depression Severity:  1-4 = Minimal depression, 5-9 = Mild depression, 10-14 = Moderate depression, 15-19 = Moderately severe depression, 20-27 = Severe depression   Psychosocial Evaluation and Intervention:  Psychosocial Evaluation - 06/09/22 1120       Psychosocial Evaluation & Interventions   Interventions Encouraged to exercise with the program and follow exercise prescription;Relaxation education;Stress management education    Comments 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 Her husband helps her with her medications. Her husband is retired and is with her all the time.    Expected Outcomes Short: Start  [facility] to help with mood. Long: Maintain a healthy mental state.     Continue Psychosocial Services  Follow up required by staff             Psychosocial Re-Evaluation:   Psychosocial Discharge (Final Psychosocial Re-Evaluation):   Education: Education Goals: Education classes will be provided on a weekly basis, covering required topics. Participant will state understanding/return demonstration of topics presented.  Learning Barriers/Preferences:  Learning Barriers/Preferences - 06/09/22 1115       Learning Barriers/Preferences   Learning Barriers Inability to learn new things   early onset dementia   Learning Preferences --   husband helps her            General Pulmonary Education Topics:  Infection Prevention: - Provides verbal and written material to individual with discussion of infection control including proper hand washing and proper equipment cleaning during exercise session. Flowsheet Row Pulmonary Rehab from 06/11/2022 in [facility] Cardiac and Pulmonary Rehab  Date 06/11/22  Educator [facility]  Instruction Review Code 1- Verbalizes Understanding       Falls Prevention: - Provides verbal and written material to individual with discussion of falls prevention and safety. Flowsheet Row Pulmonary Rehab from 06/11/2022 in [facility] Cardiac and Pulmonary Rehab  Date 06/11/22  Educator [facility]  Instruction Review Code 1- Verbalizes Understanding       Chronic Lung Disease Review: - Group verbal instruction with posters, models, PowerPoint presentations and videos,  to review new updates, new respiratory medications, new advancements in procedures and treatments. Providing information on websites and "800" numbers for continued self-education. Includes information about supplement oxygen, available portable oxygen systems, continuous and intermittent flow rates, oxygen safety, concentrators, and Medicare reimbursement for oxygen. Explanation of Pulmonary Drugs, including class, frequency, complications, importance of spacers, rinsing mouth after  steroid MDI's, and proper cleaning methods for nebulizers. Review of basic lung anatomy and physiology related to function, structure, and complications of lung disease. Review of risk factors. Discussion about methods for diagnosing sleep apnea and types of masks and machines for OSA. Includes a review of the use of types of environmental controls: home humidity, furnaces, filters, dust mite/pet prevention, HEPA vacuums. Discussion about weather changes, air quality and the benefits of nasal washing. Instruction on Warning signs, infection symptoms, calling MD promptly, preventive modes, and value of vaccinations. Review of effective airway clearance, coughing and/or vibration techniques. Emphasizing that all should Create an Action Plan. Written material given at graduation. Flowsheet Row Pulmonary Rehab from 06/11/2022 in [facility] Cardiac and Pulmonary Rehab  Education need identified 06/11/22       AED/CPR: - Group verbal and written instruction with the use of models to demonstrate the basic use of the AED with the basic ABC's of resuscitation.    Anatomy and Cardiac Procedures: - Group verbal and visual presentation and models provide information about basic cardiac anatomy and function. Reviews the testing methods done to diagnose heart disease and the outcomes of the test results. Describes the treatment choices: Medical Management, Angioplasty, or Coronary Bypass Surgery for treating various heart conditions including Myocardial Infarction, Angina, Valve Disease, and Cardiac Arrhythmias.  Written material given at graduation.   Medication Safety: - Group verbal and visual instruction to review commonly prescribed medications for heart and lung disease. Reviews the medication, class of the drug, and side effects. Includes the steps to properly store meds and maintain the prescription regimen.  Written material given at graduation.   Other: -Provides group and verbal instruction on various  topics (see  comments)   Knowledge Questionnaire Score:  Knowledge Questionnaire Score - 06/11/22 1619       Knowledge Questionnaire Score   Pre Score 15/18              Core Components/Risk Factors/Patient Goals at Admission:  Personal Goals and Risk Factors at Admission - 06/11/22 1619       Core Components/Risk Factors/Patient Goals on Admission    Weight Management Yes;Weight Maintenance    Intervention Weight Management: Develop a combined nutrition and exercise program designed to reach desired caloric intake, while maintaining appropriate intake of nutrient and fiber, sodium and fats, and appropriate energy expenditure required for the weight goal.;Weight Management: Provide education and appropriate resources to help participant work on and attain dietary goals.;Weight Management/Obesity: Establish reasonable short term and long term weight goals.    Admit Weight 118 lb (53.5 kg)    Goal Weight: Short Term 118 lb (53.5 kg)    Goal Weight: Long Term 118 lb (53.5 kg)    Expected Outcomes Short Term: Continue to assess and modify interventions until short term weight is achieved;Long Term: Adherence to nutrition and physical activity/exercise program aimed toward attainment of established weight goal;Weight Maintenance: Understanding of the daily nutrition guidelines, which includes 25-35% calories from fat, 7% or less cal from saturated fats, less than 200mg  cholesterol, less than 1.5gm of sodium, & 5 or more servings of fruits and vegetables daily;Understanding recommendations for meals to include 15-35% energy as protein, 25-35% energy from fat, 35-60% energy from carbohydrates, less than 200mg  of dietary cholesterol, 20-35 gm of total fiber daily;Understanding of distribution of calorie intake throughout the day with the consumption of 4-5 meals/snacks    Improve shortness of breath with ADL's Yes    Intervention Provide education, individualized exercise plan and daily activity  instruction to help decrease symptoms of SOB with activities of daily living.    Expected Outcomes Short Term: Improve cardiorespiratory fitness to achieve a reduction of symptoms when performing ADLs;Long Term: Be able to perform more ADLs without symptoms or delay the onset of symptoms             Education:Diabetes - Individual verbal and written instruction to review signs/symptoms of diabetes, desired ranges of glucose level fasting, after meals and with exercise. Acknowledge that pre and post exercise glucose checks will be done for 3 sessions at entry of program.   Know Your Numbers and Heart Failure: - Group verbal and visual instruction to discuss disease risk factors for cardiac and pulmonary disease and treatment options.  Reviews associated critical values for Overweight/Obesity, Hypertension, Cholesterol, and Diabetes.  Discusses basics of heart failure: signs/symptoms and treatments.  Introduces Heart Failure Zone chart for action plan for heart failure.  Written material given at graduation.   Core Components/Risk Factors/Patient Goals Review:    Core Components/Risk Factors/Patient Goals at Discharge (Final Review):    ITP Comments:  ITP Comments     Row Name 06/09/22 1114 06/11/22 1603         ITP Comments Virtual Visit completed. Patient informed on EP and RD appointment and 6 Minute walk test. Patient also informed of patient health questionnaires on My Chart. Patient Verbalizes understanding. Visit diagnosis can be found in [facility] 05/06/2022. Completed 6MWT and gym orientation. Initial ITP created and sent for review to Dr. Zetta Bills, Medical Director.               Comments: initial ITP

## 2022-06-18 ENCOUNTER — Encounter: Payer: Medicare PPO | Attending: Pulmonary Disease | Admitting: *Deleted

## 2022-06-18 DIAGNOSIS — J449 Chronic obstructive pulmonary disease, unspecified: Secondary | ICD-10-CM | POA: Diagnosis present

## 2022-06-18 NOTE — Progress Notes (Signed)
Daily Session Note  Patient Details  Name: Katrina Simon MRN: [id number] Date of Birth: [birth year] Referring Provider:   Flowsheet Row Pulmonary Rehab from 06/11/2022 in [facility] Cardiac and Pulmonary Rehab  Referring Provider Lanney Gins       Encounter Date: 06/18/2022  Check In:  Session Check In - 06/18/22 1705       Check-In   Supervising physician immediately available to respond to emergencies See telemetry face sheet for immediately available ER MD    Location [facility]-Cardiac & Pulmonary Rehab    Staff Present Renita Papa, RN BSN;Joseph [place], RCP,RRT,BSRT;Laureen [place], [place], RRT, CPFT    Virtual Visit No    Medication changes reported     No    Fall or balance concerns reported    No    Warm-up and Cool-down Performed on first and last piece of equipment    Resistance Training Performed Yes    VAD Patient? No    PAD/SET Patient? No      Pain Assessment   Currently in Pain? No/denies                Social History   Tobacco Use  Smoking Status Former   Packs/day: 0.50   Years: 45.00   Additional pack years: 0.00   Total pack years: 22.50   Types: Cigarettes   Quit date: 09/17/2018   Years since quitting: 3.7  Smokeless Tobacco Never    Goals Met:  Independence with exercise equipment Exercise tolerated well No report of concerns or symptoms today Strength training completed today  Goals Unmet:  Not Applicable  Comments: First full day of exercise!  Patient was oriented to gym and equipment including functions, settings, policies, and procedures.  Patient's individual exercise prescription and treatment plan were reviewed.  All starting workloads were established based on the results of the 6 minute walk test done at initial orientation visit.  The plan for exercise progression was also introduced and progression will be customized based on patient's performance and goals.    Dr. Emily Filbert is Medical Director for [facility].  Dr.  Ottie Glazier is Medical Director for [facility].

## 2022-06-23 ENCOUNTER — Encounter: Payer: Medicare PPO | Admitting: *Deleted

## 2022-06-23 DIAGNOSIS — J449 Chronic obstructive pulmonary disease, unspecified: Secondary | ICD-10-CM | POA: Diagnosis not present

## 2022-06-23 NOTE — Progress Notes (Signed)
Daily Session Note  Patient Details  Name: Katrina Simon MRN: [id number] Date of Birth: 04-Aug-1950 Referring Provider:   Flowsheet Row Pulmonary Rehab from 06/11/2022 in [facility] Cardiac and Pulmonary Rehab  Referring Provider Karna Christmas       Encounter Date: 06/23/2022  Check In:  Session Check In - 06/23/22 1711       Check-In   Supervising physician immediately available to respond to emergencies See telemetry face sheet for immediately available ER MD    Location [facility]-Cardiac & Pulmonary Rehab    Staff Present Susann Givens, RN BSN;Noah Tickle, BS, Exercise Physiologist;Joseph Reino [place], [place]    Virtual Visit No    Medication changes reported     No    Fall or balance concerns reported    No    Warm-up and Cool-down Performed on first and last piece of equipment    Resistance Training Performed Yes    VAD Patient? No    PAD/SET Patient? No      Pain Assessment   Currently in Pain? No/denies                Social History   Tobacco Use  Smoking Status Former   Packs/day: 0.50   Years: 45.00   Additional pack years: 0.00   Total pack years: 22.50   Types: Cigarettes   Quit date: 09/17/2018   Years since quitting: 3.7  Smokeless Tobacco Never    Goals Met:  Independence with exercise equipment Exercise tolerated well No report of concerns or symptoms today Strength training completed today  Goals Unmet:  Not Applicable  Comments: Pt able to follow exercise prescription today without complaint.  Will continue to monitor for progression.    Dr. Bethann Punches is Medical Director for [facility] Cardiac Rehabilitation.  Dr. Vida Rigger is Medical Director for [facility].

## 2022-06-24 ENCOUNTER — Telehealth: Payer: Self-pay | Admitting: *Deleted

## 2022-06-24 ENCOUNTER — Encounter: Payer: Medicare PPO | Admitting: Anesthesiology

## 2022-06-24 ENCOUNTER — Encounter: Payer: Self-pay | Admitting: Anesthesiology

## 2022-06-24 ENCOUNTER — Ambulatory Visit: Payer: Medicare PPO | Attending: Anesthesiology | Admitting: Anesthesiology

## 2022-06-24 VITALS — BP 116/68 | HR 65 | Temp 97.5°F | Ht 68.0 in | Wt 118.0 lb

## 2022-06-24 DIAGNOSIS — M47816 Spondylosis without myelopathy or radiculopathy, lumbar region: Secondary | ICD-10-CM | POA: Diagnosis present

## 2022-06-24 DIAGNOSIS — J9601 Acute respiratory failure with hypoxia: Secondary | ICD-10-CM

## 2022-06-24 DIAGNOSIS — M797 Fibromyalgia: Secondary | ICD-10-CM

## 2022-06-24 DIAGNOSIS — M159 Polyosteoarthritis, unspecified: Secondary | ICD-10-CM | POA: Diagnosis present

## 2022-06-24 DIAGNOSIS — M150 Primary generalized (osteo)arthritis: Secondary | ICD-10-CM

## 2022-06-24 DIAGNOSIS — F0280 Dementia in other diseases classified elsewhere without behavioral disturbance: Secondary | ICD-10-CM | POA: Diagnosis present

## 2022-06-24 DIAGNOSIS — I700 Atherosclerosis of aorta: Secondary | ICD-10-CM

## 2022-06-24 DIAGNOSIS — M51369 Other intervertebral disc degeneration, lumbar region without mention of lumbar back pain or lower extremity pain: Secondary | ICD-10-CM

## 2022-06-24 DIAGNOSIS — M5135 Other intervertebral disc degeneration, thoracolumbar region: Secondary | ICD-10-CM

## 2022-06-24 DIAGNOSIS — F331 Major depressive disorder, recurrent, moderate: Secondary | ICD-10-CM

## 2022-06-24 DIAGNOSIS — N1831 Chronic kidney disease, stage 3a: Secondary | ICD-10-CM

## 2022-06-24 DIAGNOSIS — E43 Unspecified severe protein-calorie malnutrition: Secondary | ICD-10-CM | POA: Diagnosis present

## 2022-06-24 DIAGNOSIS — G894 Chronic pain syndrome: Secondary | ICD-10-CM | POA: Diagnosis present

## 2022-06-24 DIAGNOSIS — F1190 Opioid use, unspecified, uncomplicated: Secondary | ICD-10-CM

## 2022-06-24 DIAGNOSIS — M170 Bilateral primary osteoarthritis of knee: Secondary | ICD-10-CM | POA: Diagnosis present

## 2022-06-24 DIAGNOSIS — F0150 Vascular dementia without behavioral disturbance: Secondary | ICD-10-CM | POA: Diagnosis present

## 2022-06-24 DIAGNOSIS — G309 Alzheimer's disease, unspecified: Secondary | ICD-10-CM

## 2022-06-24 DIAGNOSIS — M5136 Other intervertebral disc degeneration, lumbar region: Secondary | ICD-10-CM | POA: Diagnosis present

## 2022-06-24 MED ORDER — OXYCODONE HCL 10 MG PO TABS: 10.0000 mg | ORAL_TABLET | Freq: Three times a day (TID) | ORAL | 0 refills | Status: DC

## 2022-06-24 MED ORDER — OXYCODONE HCL 10 MG PO TABS: 10.0000 mg | ORAL_TABLET | Freq: Three times a day (TID) | ORAL | 0 refills | Status: AC

## 2022-06-24 NOTE — Progress Notes (Unsigned)
Nursing Pain Medication Assessment:  Safety precautions to be maintained throughout the outpatient stay will include: orient to surroundings, keep bed in low position, maintain call bell within reach at all times, provide assistance with transfer out of bed and ambulation.  Medication Inspection Compliance: Pill count conducted under aseptic conditions, in front of the patient. Neither the pills nor the bottle was removed from the patient's sight at any time. Once count was completed pills were immediately returned to the patient in their original bottle.  Medication: Oxycodone IR Pill/Patch Count:  36/75 Pill/Patch Appearance: Markings consistent with prescribed medication Bottle Appearance: Standard pharmacy container. Clearly labeled. Filled Date: 03 / 29 / 2024 Last Medication intake:  Today

## 2022-06-24 NOTE — Telephone Encounter (Signed)
Called patient and husband answered. Asked husband when patient was returning to give UDS sample that she was unable to give at today's visit. States she will be here tomorrow to give sample. I informed him that she will be unable to fill prescriptions if we do not get the UDS. He verbalizes instructions.

## 2022-06-25 ENCOUNTER — Telehealth: Payer: Self-pay | Admitting: Anesthesiology

## 2022-06-25 NOTE — Telephone Encounter (Signed)
Husband call stated that patient isn't feeling well today. He will bring her tomorrow for UDS.

## 2022-06-25 NOTE — Progress Notes (Signed)
Subjective:  Patient ID: Katrina Simon, female    DOB: 07/05/1950  Age: 72 y.o. MRN: [id number]  CC: Back Pain and Foot Pain (left)   Procedure: None  HPI Katrina Simon presents for reevaluation.  She presents today with her husband and continues to have diffuse body pain primarily affecting the low back hips buttocks and knees occasionally shoulders.  She takes her medications as prescribed and he assists with these.  Unfortunately she has succumbed to worsening dementia which complicates her management but he is very effective at helping to manage her medication therapy.  From discussion today it appears that the quality characteristic and distribution of the pain she experiences is stable in nature with no recent changes.  She is trying to stay active doing stretching exercises with the assistance of her family.  Without medication management she appears to them to be quite miserable and therefore is continue to take the chronic opioid therapy to help with this.  With the medication management she personally reports good relief and from their observation she is more personable and functional during the day and sleeping well at night.  No side effects with the medications are reported.  Outpatient Medications Prior to Visit  Medication Sig Dispense Refill   albuterol (PROVENTIL HFA;VENTOLIN HFA) 108 (90 Base) MCG/ACT inhaler Inhale into the lungs every 6 (six) hours as needed for wheezing or shortness of breath.     aspirin EC 81 MG tablet Take 81 mg by mouth daily. Swallow whole.     Cholecalciferol 25 MCG (1000 UT) tablet Take by mouth.     donepezil (ARICEPT) 5 MG tablet Take by mouth.     folic acid (FOLVITE) 1 MG tablet Take by mouth.     ipratropium-albuterol (DUONEB) 0.5-2.5 (3) MG/3ML SOLN Inhale into the lungs.     naloxone (NARCAN) nasal spray 4 mg/0.1 mL Place into the nose.     pantoprazole (PROTONIX) 40 MG tablet Take 40 mg by mouth daily.     solifenacin (VESICARE) 10 MG  tablet Take 10 mg by mouth daily.     vitamin B-12 (CYANOCOBALAMIN) 100 MCG tablet Take 100 mcg by mouth daily.     Oxycodone HCl 10 MG TABS Take 1 tablet (10 mg total) by mouth in the morning, at noon, and at bedtime. 75 tablet 0   ciprofloxacin (CIPRO) 500 MG tablet Take 500 mg by mouth 2 (two) times daily. (Patient not taking: Reported on 06/24/2022)     diclofenac Sodium (VOLTAREN) 1 % GEL Apply 4 g topically 4 (four) times daily. (Patient not taking: Reported on 06/24/2022) 100 g 3   escitalopram (LEXAPRO) 10 MG tablet Take 10 mg by mouth daily. (Patient not taking: Reported on 06/24/2022)     escitalopram (LEXAPRO) 10 MG tablet Take by mouth. (Patient not taking: Reported on 06/09/2022)     escitalopram (LEXAPRO) 20 MG tablet Take by mouth. (Patient not taking: Reported on 06/09/2022)     FLUoxetine (PROZAC) 20 MG capsule Take 60 mg by mouth every morning. (Patient not taking: Reported on 06/09/2022)     lidocaine (LIDODERM) 5 % Place 1 patch onto the skin daily. (Patient not taking: Reported on 06/24/2022)     memantine (NAMENDA) 5 MG tablet Take 5 mg by mouth 2 (two) times daily. (Patient not taking: Reported on 06/24/2022)     nitrofurantoin, macrocrystal-monohydrate, (MACROBID) 100 MG capsule Take 100 mg by mouth 2 (two) times daily. (Patient not taking: Reported on 06/24/2022)  OLANZapine (ZYPREXA) 5 MG tablet Take 5 mg by mouth daily. (Patient not taking: Reported on 11/04/2021)     QUEtiapine (SEROQUEL) 25 MG tablet Take 25 mg by mouth at bedtime as needed. (Patient not taking: Reported on 06/09/2022)     QUEtiapine (SEROQUEL) 50 MG tablet Take by mouth. (Patient not taking: Reported on 06/09/2022)     solifenacin (VESICARE) 5 MG tablet Take by mouth. (Patient not taking: Reported on 06/09/2022)     solifenacin (VESICARE) 5 MG tablet Take by mouth. (Patient not taking: Reported on 06/09/2022)     sulfamethoxazole-trimethoprim (BACTRIM DS) 800-160 MG tablet Take 1 tablet by mouth 2 (two) times daily.  (Patient not taking: Reported on 06/09/2022)     No facility-administered medications prior to visit.    Review of Systems CNS: No confusion or sedation Cardiac: No angina or palpitations GI: No abdominal pain or constipation Constitutional: No nausea vomiting fevers or chills  Objective:  BP 116/68 (BP Location: Right Arm, Patient Position: Sitting, Cuff Size: Normal)   Pulse 65   Temp (!) 97.5 F (36.4 C) (Temporal)   Ht 5\' 8"  (1.727 m)   Wt 118 lb (53.5 kg)   LMP  (LMP Unknown)   SpO2 100%   BMI 17.94 kg/m    BP Readings from Last 3 Encounters:  06/24/22 116/68  04/14/22 122/72  12/05/21 123/61     Wt Readings from Last 3 Encounters:  06/24/22 118 lb (53.5 kg)  06/11/22 118 lb (53.5 kg)  04/13/22 103 lb 6.3 oz (46.9 kg)     Physical Exam Pt is alert and oriented PERRL EOMI HEART IS RRR no murmur or rub LCTA no wheezing or rales MUSCULOSKELETAL reveals good muscle tone and bulk to the lower extremities.  She goes from seated to standing with minimal difficulty.  She is ambulating with a mildly antalgic gait but appears to be at baseline.  Labs  No results found for: "HGBA1C" Lab Results  Component Value Date   CREATININE 0.96 04/13/2022    -------------------------------------------------------------------------------------------------------------------- Lab Results  Component Value Date   WBC 5.9 04/13/2022   HGB 11.4 (L) 04/13/2022   HCT 34.7 (L) 04/13/2022   PLT 247 04/13/2022   GLUCOSE 137 (H) 04/13/2022   ALT 16 06/20/2021   AST 25 06/20/2021   NA 140 04/13/2022   K 3.6 04/13/2022   CL 103 04/13/2022   CREATININE 0.96 04/13/2022   BUN 20 04/13/2022   CO2 28 04/13/2022   TSH 0.304 (L) 07/26/2020   INR 1.1 07/26/2020    --------------------------------------------------------------------------------------------------------------------- CT HEAD WO CONTRAST  Result Date: 04/14/2022 CLINICAL DATA:  Fall, posterior head injury EXAM: CT HEAD  WITHOUT CONTRAST CT CERVICAL SPINE WITHOUT CONTRAST TECHNIQUE: Multidetector CT imaging of the head and cervical spine was performed following the standard protocol without intravenous contrast. Multiplanar CT image reconstructions of the cervical spine were also generated. RADIATION DOSE REDUCTION: This exam was performed according to the departmental dose-optimization program which includes automated exposure control, adjustment of the mA and/or kV according to patient size and/or use of iterative reconstruction technique. COMPARISON:  CT head dated 07/25/2020. FINDINGS: CT HEAD FINDINGS Brain: No evidence of acute infarction, hemorrhage, hydrocephalus, extra-axial collection or mass lesion/mass effect. Subcortical white matter and periventricular small vessel ischemic changes. Vascular: Intracranial atherosclerosis. Skull: Normal. Negative for fracture or focal lesion. Sinuses/Orbits: The visualized paranasal sinuses are essentially clear. The mastoid air cells are unopacified. Other: Mild soft tissue irregularity overlying the left occiput (series 3/image 38). CT CERVICAL  SPINE FINDINGS Alignment: Normal cervical lordosis. Skull base and vertebrae: Mild superior endplate compression fracture deformity at C7 (sagittal image 25), age indeterminate, likely chronic. No retropulsion. Otherwise, no fracture is seen. Soft tissues and spinal canal: No prevertebral fluid or swelling. No visible canal hematoma. Disc levels: Intervertebral disc spaces are maintained. Spinal canal is patent. Upper chest: Visualized lung apices are notable for biapical pleural-parenchymal scarring and paraseptal emphysematous changes. Other: Visualized thyroid is unremarkable. IMPRESSION: Mild soft tissue irregularity overlying the left occiput. No evidence of calvarial fracture. Small vessel ischemic changes. Mild superior endplate compression fracture deformity at C7, age indeterminate, likely chronic. No retropulsion. No acute traumatic  injury to the cervical spine. Emphysema (ICD10-J43.9). Electronically Signed   By: Charline Bills M.D.   On: 04/14/2022 00:23   CT Cervical Spine Wo Contrast  Result Date: 04/14/2022 CLINICAL DATA:  Fall, posterior head injury EXAM: CT HEAD WITHOUT CONTRAST CT CERVICAL SPINE WITHOUT CONTRAST TECHNIQUE: Multidetector CT imaging of the head and cervical spine was performed following the standard protocol without intravenous contrast. Multiplanar CT image reconstructions of the cervical spine were also generated. RADIATION DOSE REDUCTION: This exam was performed according to the departmental dose-optimization program which includes automated exposure control, adjustment of the mA and/or kV according to patient size and/or use of iterative reconstruction technique. COMPARISON:  CT head dated 07/25/2020. FINDINGS: CT HEAD FINDINGS Brain: No evidence of acute infarction, hemorrhage, hydrocephalus, extra-axial collection or mass lesion/mass effect. Subcortical white matter and periventricular small vessel ischemic changes. Vascular: Intracranial atherosclerosis. Skull: Normal. Negative for fracture or focal lesion. Sinuses/Orbits: The visualized paranasal sinuses are essentially clear. The mastoid air cells are unopacified. Other: Mild soft tissue irregularity overlying the left occiput (series 3/image 38). CT CERVICAL SPINE FINDINGS Alignment: Normal cervical lordosis. Skull base and vertebrae: Mild superior endplate compression fracture deformity at C7 (sagittal image 25), age indeterminate, likely chronic. No retropulsion. Otherwise, no fracture is seen. Soft tissues and spinal canal: No prevertebral fluid or swelling. No visible canal hematoma. Disc levels: Intervertebral disc spaces are maintained. Spinal canal is patent. Upper chest: Visualized lung apices are notable for biapical pleural-parenchymal scarring and paraseptal emphysematous changes. Other: Visualized thyroid is unremarkable. IMPRESSION: Mild soft  tissue irregularity overlying the left occiput. No evidence of calvarial fracture. Small vessel ischemic changes. Mild superior endplate compression fracture deformity at C7, age indeterminate, likely chronic. No retropulsion. No acute traumatic injury to the cervical spine. Emphysema (ICD10-J43.9). Electronically Signed   By: Charline Bills M.D.   On: 04/14/2022 00:23     Assessment & Plan:   Katrina Simon was seen today for back pain and foot pain.  Diagnoses and all orders for this visit:  DDD (degenerative disc disease), lumbar  DDD (degenerative disc disease), thoracolumbar  Primary osteoarthritis of both knees  Primary osteoarthritis involving multiple joints  Facet syndrome, lumbar  Fibromyalgia  Chronic, continuous use of opioids -     ToxASSURE Select 13 (MW), Urine  Chronic pain syndrome -     ToxASSURE Select 13 (MW), Urine  Mixed Alzheimer's and vascular dementia  Protein-calorie malnutrition, severe  Major depressive disorder, recurrent, moderate  Hardening of the aorta (main artery of the heart)  Acute respiratory failure with hypoxia  Chronic kidney disease, stage 3a  Other orders -     Oxycodone HCl 10 MG TABS; Take 1 tablet (10 mg total) by mouth in the morning, at noon, and at bedtime. -     Oxycodone HCl 10 MG TABS; Take 1 tablet (10  mg total) by mouth in the morning, at noon, and at bedtime.        ----------------------------------------------------------------------------------------------------------------------  Problem List Items Addressed This Visit       Unprioritized   Chronic kidney disease, stage 3a   Chronic pain syndrome   Relevant Medications   Oxycodone HCl 10 MG TABS (Start on 07/12/2022)   Oxycodone HCl 10 MG TABS (Start on 08/12/2022)   Other Relevant Orders   ToxASSURE Select 13 (MW), Urine   Chronic, continuous use of opioids   Relevant Orders   ToxASSURE Select 13 (MW), Urine   DDD (degenerative disc disease), lumbar -  Primary   Relevant Medications   Oxycodone HCl 10 MG TABS (Start on 07/12/2022)   Oxycodone HCl 10 MG TABS (Start on 08/12/2022)   DDD (degenerative disc disease), thoracolumbar   Relevant Medications   Oxycodone HCl 10 MG TABS (Start on 07/12/2022)   Oxycodone HCl 10 MG TABS (Start on 08/12/2022)   DJD (degenerative joint disease) shoulder, sacroiliac joint    Relevant Medications   Oxycodone HCl 10 MG TABS (Start on 07/12/2022)   Oxycodone HCl 10 MG TABS (Start on 08/12/2022)   Facet syndrome, lumbar   Relevant Medications   Oxycodone HCl 10 MG TABS (Start on 07/12/2022)   Oxycodone HCl 10 MG TABS (Start on 08/12/2022)   Fibromyalgia   Relevant Medications   Oxycodone HCl 10 MG TABS (Start on 07/12/2022)   Oxycodone HCl 10 MG TABS (Start on 08/12/2022)   Hardening of the aorta (main artery of the heart)   Major depressive disorder, recurrent, moderate   Mixed Alzheimer's and vascular dementia   Protein-calorie malnutrition, severe   Other Visit Diagnoses     Acute respiratory failure with hypoxia   (Chronic)           ----------------------------------------------------------------------------------------------------------------------  1. DDD (degenerative disc disease), lumbar Will continue with current stretching strengthening exercises with family assistance.  2. DDD (degenerative disc disease), thoracolumbar As above  3. Primary osteoarthritis of both knees   4. Primary osteoarthritis involving multiple joints   5. Facet syndrome, lumbar   6. Fibromyalgia   7. Chronic, continuous use of opioids I have reviewed the [facility] practitioner database information is appropriate for refill on this be done for the next 2 months dated for April 12 Aug 2026 with schedule return to clinic in 2 months. - ToxASSURE Select 13 (MW), Urine  8. Chronic pain syndrome Continue follow-up with her primary care physicians for baseline medical care and above-mentioned problems -  ToxASSURE Select 13 (MW), Urine  9. Mixed Alzheimer's and vascular dementia As above  10. Protein-calorie malnutrition, severe   11. Major depressive disorder, recurrent, moderate   12. Hardening of the aorta (main artery of the heart)   13. Acute respiratory failure with hypoxia   14. Chronic kidney disease, stage 3a     ----------------------------------------------------------------------------------------------------------------------  I am having Olegario Messier T. Sorg start on Oxycodone HCl. I am also having her maintain her albuterol, OLANZapine, diclofenac Sodium, aspirin EC, vitamin B-12, pantoprazole, lidocaine, naloxone, Cholecalciferol, escitalopram, folic acid, ipratropium-albuterol, solifenacin, ciprofloxacin, donepezil, FLUoxetine, memantine, nitrofurantoin (macrocrystal-monohydrate), QUEtiapine, QUEtiapine, sulfamethoxazole-trimethoprim, escitalopram, escitalopram, solifenacin, solifenacin, and Oxycodone HCl.   Meds ordered this encounter  Medications   Oxycodone HCl 10 MG TABS    Sig: Take 1 tablet (10 mg total) by mouth in the morning, at noon, and at bedtime.    Dispense:  75 tablet    Refill:  0   Oxycodone HCl 10 MG  TABS    Sig: Take 1 tablet (10 mg total) by mouth in the morning, at noon, and at bedtime.    Dispense:  75 tablet    Refill:  0   Patient's Medications  New Prescriptions   OXYCODONE HCL 10 MG TABS    Take 1 tablet (10 mg total) by mouth in the morning, at noon, and at bedtime.  Previous Medications   ALBUTEROL (PROVENTIL HFA;VENTOLIN HFA) 108 (90 BASE) MCG/ACT INHALER    Inhale into the lungs every 6 (six) hours as needed for wheezing or shortness of breath.   ASPIRIN EC 81 MG TABLET    Take 81 mg by mouth daily. Swallow whole.   CHOLECALCIFEROL 25 MCG (1000 UT) TABLET    Take by mouth.   CIPROFLOXACIN (CIPRO) 500 MG TABLET    Take 500 mg by mouth 2 (two) times daily.   DICLOFENAC SODIUM (VOLTAREN) 1 % GEL    Apply 4 g topically 4 (four) times  daily.   DONEPEZIL (ARICEPT) 5 MG TABLET    Take by mouth.   ESCITALOPRAM (LEXAPRO) 10 MG TABLET    Take 10 mg by mouth daily.   ESCITALOPRAM (LEXAPRO) 10 MG TABLET    Take by mouth.   ESCITALOPRAM (LEXAPRO) 20 MG TABLET    Take by mouth.   FLUOXETINE (PROZAC) 20 MG CAPSULE    Take 60 mg by mouth every morning.   FOLIC ACID (FOLVITE) 1 MG TABLET    Take by mouth.   IPRATROPIUM-ALBUTEROL (DUONEB) 0.5-2.5 (3) MG/3ML SOLN    Inhale into the lungs.   LIDOCAINE (LIDODERM) 5 %    Place 1 patch onto the skin daily.   MEMANTINE (NAMENDA) 5 MG TABLET    Take 5 mg by mouth 2 (two) times daily.   NALOXONE (NARCAN) NASAL SPRAY 4 MG/0.1 ML    Place into the nose.   NITROFURANTOIN, MACROCRYSTAL-MONOHYDRATE, (MACROBID) 100 MG CAPSULE    Take 100 mg by mouth 2 (two) times daily.   OLANZAPINE (ZYPREXA) 5 MG TABLET    Take 5 mg by mouth daily.   PANTOPRAZOLE (PROTONIX) 40 MG TABLET    Take 40 mg by mouth daily.   QUETIAPINE (SEROQUEL) 25 MG TABLET    Take 25 mg by mouth at bedtime as needed.   QUETIAPINE (SEROQUEL) 50 MG TABLET    Take by mouth.   SOLIFENACIN (VESICARE) 10 MG TABLET    Take 10 mg by mouth daily.   SOLIFENACIN (VESICARE) 5 MG TABLET    Take by mouth.   SOLIFENACIN (VESICARE) 5 MG TABLET    Take by mouth.   SULFAMETHOXAZOLE-TRIMETHOPRIM (BACTRIM DS) 800-160 MG TABLET    Take 1 tablet by mouth 2 (two) times daily.   VITAMIN B-12 (CYANOCOBALAMIN) 100 MCG TABLET    Take 100 mcg by mouth daily.  Modified Medications   Modified Medication Previous Medication   OXYCODONE HCL 10 MG TABS Oxycodone HCl 10 MG TABS      Take 1 tablet (10 mg total) by mouth in the morning, at noon, and at bedtime.    Take 1 tablet (10 mg total) by mouth in the morning, at noon, and at bedtime.  Discontinued Medications   No medications on file   ----------------------------------------------------------------------------------------------------------------------  Follow-up: Return in about 2 months (around 08/24/2022)  for evaluation, med refill.    Yevette Edwards, MD

## 2022-06-25 NOTE — Telephone Encounter (Signed)
noted 

## 2022-06-30 ENCOUNTER — Encounter: Payer: Medicare PPO | Admitting: *Deleted

## 2022-06-30 DIAGNOSIS — J449 Chronic obstructive pulmonary disease, unspecified: Secondary | ICD-10-CM | POA: Diagnosis not present

## 2022-06-30 NOTE — Progress Notes (Signed)
Daily Session Note  Patient Details  Name: Katrina Simon MRN: [id number] Date of Birth: 03-16-1951 Referring Provider:   Flowsheet Row Pulmonary Rehab from 06/11/2022 in [facility] Cardiac and Pulmonary Rehab  Referring Provider Karna Christmas       Encounter Date: 06/30/2022  Check In:  Session Check In - 06/30/22 1739       Check-In   Supervising physician immediately available to respond to emergencies See telemetry face sheet for immediately available ER MD    Location [facility]-Cardiac & Pulmonary Rehab    Staff Present Lanny Hurst, RN, ADN;Joseph Reino Kent, RCP,RRT,BSRT;Noah Tickle, BS, Exercise Physiologist    Virtual Visit No    Medication changes reported     No    Fall or balance concerns reported    No    Warm-up and Cool-down Performed on first and last piece of equipment    Resistance Training Performed Yes    VAD Patient? No    PAD/SET Patient? No      Pain Assessment   Currently in Pain? No/denies                Social History   Tobacco Use  Smoking Status Former   Packs/day: 0.50   Years: 45.00   Additional pack years: 0.00   Total pack years: 22.50   Types: Cigarettes   Quit date: 09/17/2018   Years since quitting: 3.7  Smokeless Tobacco Never    Goals Met:  Independence with exercise equipment Exercise tolerated well No report of concerns or symptoms today Strength training completed today  Goals Unmet:  Not Applicable  Comments: Pt able to follow exercise prescription today without complaint.  Will continue to monitor for progression.    Dr. Bethann Punches is Medical Director for [facility] Cardiac Rehabilitation.  Dr. Vida Rigger is Medical Director for [facility] Pulmonary Rehabilitation.

## 2022-07-02 ENCOUNTER — Encounter: Payer: Self-pay | Admitting: *Deleted

## 2022-07-02 ENCOUNTER — Encounter: Payer: Medicare PPO | Admitting: *Deleted

## 2022-07-02 DIAGNOSIS — J449 Chronic obstructive pulmonary disease, unspecified: Secondary | ICD-10-CM

## 2022-07-02 NOTE — Progress Notes (Signed)
Daily Session Note  Patient Details  Name: Katrina Simon MRN: [id number] Date of Birth: 11/17/1950 Referring Provider:   Flowsheet Row Pulmonary Rehab from 06/11/2022 in [facility] Cardiac and Pulmonary Rehab  Referring Provider Karna Christmas       Encounter Date: 07/02/2022  Check In:  Session Check In - 07/02/22 1725       Check-In   Supervising physician immediately available to respond to emergencies See telemetry face sheet for immediately available ER MD    Location [facility]-Cardiac & Pulmonary Rehab    Staff Present Susann Givens, RN Mabeline Caras, BS, ACSM CEP, Exercise Physiologist;Joseph Reino Kent, [place]    Virtual Visit No    Medication changes reported     No    Fall or balance concerns reported    No    Warm-up and Cool-down Performed on first and last piece of equipment    Resistance Training Performed Yes    VAD Patient? No    PAD/SET Patient? No      Pain Assessment   Currently in Pain? No/denies                Social History   Tobacco Use  Smoking Status Former   Packs/day: 0.50   Years: 45.00   Additional pack years: 0.00   Total pack years: 22.50   Types: Cigarettes   Quit date: 09/17/2018   Years since quitting: 3.7  Smokeless Tobacco Never    Goals Met:  Independence with exercise equipment Exercise tolerated well No report of concerns or symptoms today Strength training completed today  Goals Unmet:  Not Applicable  Comments: Pt able to follow exercise prescription today without complaint.  Will continue to monitor for progression.    Dr. Bethann Punches is Medical Director for [facility] Cardiac Rehabilitation.  Dr. Vida Rigger is Medical Director for [facility] Pulmonary Rehabilitation.

## 2022-07-02 NOTE — Progress Notes (Signed)
Pulmonary Individual Treatment Plan  Patient Details  Name: Katrina Simon MRN: [id number] Date of Birth: 1951/01/27 Referring Provider:   Flowsheet Row Pulmonary Rehab from 06/11/2022 in [facility] Cardiac and Pulmonary Rehab  Referring Provider Aleskerov       Initial Encounter Date:  Flowsheet Row Pulmonary Rehab from 06/11/2022 in [facility] Cardiac and Pulmonary Rehab  Date 06/11/22       Visit Diagnosis: Chronic obstructive pulmonary disease, unspecified COPD type  Patient's Home Medications on Admission:  Current Outpatient Medications:    albuterol (PROVENTIL HFA;VENTOLIN HFA) 108 (90 Base) MCG/ACT inhaler, Inhale into the lungs every 6 (six) hours as needed for wheezing or shortness of breath., Disp: , Rfl:    aspirin EC 81 MG tablet, Take 81 mg by mouth daily. Swallow whole., Disp: , Rfl:    Cholecalciferol 25 MCG (1000 UT) tablet, Take by mouth., Disp: , Rfl:    ciprofloxacin (CIPRO) 500 MG tablet, Take 500 mg by mouth 2 (two) times daily. (Patient not taking: Reported on 06/24/2022), Disp: , Rfl:    diclofenac Sodium (VOLTAREN) 1 % GEL, Apply 4 g topically 4 (four) times daily. (Patient not taking: Reported on 06/24/2022), Disp: 100 g, Rfl: 3   donepezil (ARICEPT) 5 MG tablet, Take by mouth., Disp: , Rfl:    escitalopram (LEXAPRO) 10 MG tablet, Take 10 mg by mouth daily. (Patient not taking: Reported on 06/24/2022), Disp: , Rfl:    escitalopram (LEXAPRO) 10 MG tablet, Take by mouth. (Patient not taking: Reported on 06/09/2022), Disp: , Rfl:    escitalopram (LEXAPRO) 20 MG tablet, Take by mouth. (Patient not taking: Reported on 06/09/2022), Disp: , Rfl:    FLUoxetine (PROZAC) 20 MG capsule, Take 60 mg by mouth every morning. (Patient not taking: Reported on 06/09/2022), Disp: , Rfl:    folic acid (FOLVITE) 1 MG tablet, Take by mouth., Disp: , Rfl:    ipratropium-albuterol (DUONEB) 0.5-2.5 (3) MG/3ML SOLN, Inhale into the lungs., Disp: , Rfl:    lidocaine (LIDODERM) 5 %, Place 1 patch onto the  skin daily. (Patient not taking: Reported on 06/24/2022), Disp: , Rfl:    memantine (NAMENDA) 5 MG tablet, Take 5 mg by mouth 2 (two) times daily. (Patient not taking: Reported on 06/24/2022), Disp: , Rfl:    naloxone (NARCAN) nasal spray 4 mg/0.1 mL, Place into the nose., Disp: , Rfl:    nitrofurantoin, macrocrystal-monohydrate, (MACROBID) 100 MG capsule, Take 100 mg by mouth 2 (two) times daily. (Patient not taking: Reported on 06/24/2022), Disp: , Rfl:    OLANZapine (ZYPREXA) 5 MG tablet, Take 5 mg by mouth daily. (Patient not taking: Reported on 11/04/2021), Disp: , Rfl:    [START ON 07/12/2022] Oxycodone HCl 10 MG TABS, Take 1 tablet (10 mg total) by mouth in the morning, at noon, and at bedtime., Disp: 75 tablet, Rfl: 0   [START ON 08/12/2022] Oxycodone HCl 10 MG TABS, Take 1 tablet (10 mg total) by mouth in the morning, at noon, and at bedtime., Disp: 75 tablet, Rfl: 0   pantoprazole (PROTONIX) 40 MG tablet, Take 40 mg by mouth daily., Disp: , Rfl:    QUEtiapine (SEROQUEL) 25 MG tablet, Take 25 mg by mouth at bedtime as needed. (Patient not taking: Reported on 06/09/2022), Disp: , Rfl:    QUEtiapine (SEROQUEL) 50 MG tablet, Take by mouth. (Patient not taking: Reported on 06/09/2022), Disp: , Rfl:    solifenacin (VESICARE) 10 MG tablet, Take 10 mg by mouth daily., Disp: , Rfl:  solifenacin (VESICARE) 5 MG tablet, Take by mouth. (Patient not taking: Reported on 06/09/2022), Disp: , Rfl:    solifenacin (VESICARE) 5 MG tablet, Take by mouth. (Patient not taking: Reported on 06/09/2022), Disp: , Rfl:    sulfamethoxazole-trimethoprim (BACTRIM DS) 800-160 MG tablet, Take 1 tablet by mouth 2 (two) times daily. (Patient not taking: Reported on 06/09/2022), Disp: , Rfl:    vitamin B-12 (CYANOCOBALAMIN) 100 MCG tablet, Take 100 mcg by mouth daily., Disp: , Rfl:   Past Medical History: Past Medical History:  Diagnosis Date   Achalasia    Allergy    Arthritis    Depression    Emphysema of lung    Fibromyalgia     Gastroesophageal reflux disease with esophagitis 01/26/2020   Formatting of this note might be different from the original. LA Grade C noted on EGD 01/2020   GERD (gastroesophageal reflux disease)    Herpes genitalis    History of chicken pox    History of kidney stones     Tobacco Use: Social History   Tobacco Use  Smoking Status Former   Packs/day: 0.50   Years: 45.00   Additional pack years: 0.00   Total pack years: 22.50   Types: Cigarettes   Quit date: 09/17/2018   Years since quitting: 3.7  Smokeless Tobacco Never    Labs: Review Flowsheet       Latest Ref Rng & Units 07/26/2020 06/20/2021  Labs for ITP Cardiac and Pulmonary Rehab  Bicarbonate 20.0 - 28.0 mmol/L 29.8  22.6   Acid-base deficit 0.0 - 2.0 mmol/L - 5.5   O2 Saturation % 96.5  93.4      Pulmonary Assessment Scores:  Pulmonary Assessment Scores     Row Name 06/11/22 1620         ADL UCSD   ADL Phase Entry     SOB Score total --  partially completed. unable to complete form in full due to lack of understanding (dementia)     Rest 1     Walk 1     Stairs 1     Bath 1     Dress 0     Shop 0       CAT Score   CAT Score 10       mMRC Score   mMRC Score 2              UCSD: Self-administered rating of dyspnea associated with activities of daily living (ADLs) 6-point scale (0 = "not at all" to 5 = "maximal or unable to do because of breathlessness")  Scoring Scores range from 0 to 120.  Minimally important difference is 5 units  CAT: CAT can identify the health impairment of COPD patients and is better correlated with disease progression.  CAT has a scoring range of zero to 40. The CAT score is classified into four groups of low (less than 10), medium (10 - 20), high (21-30) and very high (31-40) based on the impact level of disease on health status. A CAT score over 10 suggests significant symptoms.  A worsening CAT score could be explained by an exacerbation, poor medication adherence,  poor inhaler technique, or progression of COPD or comorbid conditions.  CAT MCID is 2 points  mMRC: mMRC (Modified Medical Research Council) Dyspnea Scale is used to assess the degree of baseline functional disability in patients of respiratory disease due to dyspnea. No minimal important difference is established. A decrease in score of 1 point or  greater is considered a positive change.   Pulmonary Function Assessment:  Pulmonary Function Assessment - 06/09/22 1115       Breath   Shortness of Breath Yes;Limiting activity             Exercise Target Goals: Exercise Program Goal: Individual exercise prescription set using results from initial 6 min walk test and THRR while considering  patient's activity barriers and safety.   Exercise Prescription Goal: Initial exercise prescription builds to 30-45 minutes a day of aerobic activity, 2-3 days per week.  Home exercise guidelines will be given to patient during program as part of exercise prescription that the participant will acknowledge.  Education: Aerobic Exercise: - Group verbal and visual presentation on the components of exercise prescription. Introduces F.I.T.T principle from ACSM for exercise prescriptions.  Reviews F.I.T.T. principles of aerobic exercise including progression. Written material given at graduation.   Education: Resistance Exercise: - Group verbal and visual presentation on the components of exercise prescription. Introduces F.I.T.T principle from ACSM for exercise prescriptions  Reviews F.I.T.T. principles of resistance exercise including progression. Written material given at graduation.    Education: Exercise & Equipment Safety: - Individual verbal instruction and demonstration of equipment use and safety with use of the equipment. Flowsheet Row Pulmonary Rehab from 06/11/2022 in [facility] Cardiac and Pulmonary Rehab  Date 06/11/22  Educator [facility]  Instruction Review Code 1- Verbalizes Understanding        Education: Exercise Physiology & General Exercise Guidelines: - Group verbal and written instruction with models to review the exercise physiology of the cardiovascular system and associated critical values. Provides general exercise guidelines with specific guidelines to those with heart or lung disease.    Education: Flexibility, Balance, Mind/Body Relaxation: - Group verbal and visual presentation with interactive activity on the components of exercise prescription. Introduces F.I.T.T principle from ACSM for exercise prescriptions. Reviews F.I.T.T. principles of flexibility and balance exercise training including progression. Also discusses the mind body connection.  Reviews various relaxation techniques to help reduce and manage stress (i.e. Deep breathing, progressive muscle relaxation, and visualization). Balance handout provided to take home. Written material given at graduation.   Activity Barriers & Risk Stratification:  Activity Barriers & Cardiac Risk Stratification - 06/11/22 1609       Activity Barriers & Cardiac Risk Stratification   Activity Barriers History of Falls;Other (comment)    Comments Dementia             6 Minute Walk:  6 Minute Walk     Row Name 06/11/22 1604         6 Minute Walk   Phase Initial     Distance 420 feet     Walk Time 6 minutes     # of Rest Breaks 0     MPH 0.8     METS 1.7     RPE 11     Perceived Dyspnea  0     VO2 Peak 6.03     Symptoms No     Resting HR 87 bpm     Resting BP 122/70     Resting Oxygen Saturation  97 %     Exercise Oxygen Saturation  during 6 min walk 88 %     Max Ex. HR 94 bpm     Max Ex. BP 142/82     2 Minute Post BP 122/80       Interval HR   1 Minute HR 89     2 Minute HR  94     3 Minute HR 89     4 Minute HR 94     5 Minute HR 94     6 Minute HR 92     2 Minute Post HR 84     Interval Heart Rate? Yes       Interval Oxygen   Interval Oxygen? Yes     Baseline Oxygen Saturation % 97 %      1 Minute Oxygen Saturation % 96 %     1 Minute Liters of Oxygen 0 L     2 Minute Oxygen Saturation % 91 %     2 Minute Liters of Oxygen 0 L     3 Minute Oxygen Saturation % 88 %     3 Minute Liters of Oxygen 0 L     4 Minute Oxygen Saturation % 88 %     4 Minute Liters of Oxygen 0 L     5 Minute Oxygen Saturation % 92 %     5 Minute Liters of Oxygen 0 L     6 Minute Oxygen Saturation % 94 %     6 Minute Liters of Oxygen 0 L     2 Minute Post Oxygen Saturation % 96 %     2 Minute Post Liters of Oxygen 0 L             Oxygen Initial Assessment:  Oxygen Initial Assessment - 06/11/22 1620       Home Oxygen   Home Oxygen Device None    Sleep Oxygen Prescription None    Home Exercise Oxygen Prescription None    Home Resting Oxygen Prescription None      Initial 6 min Walk   Oxygen Used None      Program Oxygen Prescription   Program Oxygen Prescription None      Intervention   Short Term Goals To learn and exhibit compliance with exercise, home and travel O2 prescription;To learn and understand importance of maintaining oxygen saturations>88%;To learn and demonstrate proper use of respiratory medications;To learn and demonstrate proper pursed lip breathing techniques or other breathing techniques. ;To learn and understand importance of monitoring SPO2 with pulse oximeter and demonstrate accurate use of the pulse oximeter.    Long  Term Goals Verbalizes importance of monitoring SPO2 with pulse oximeter and return demonstration;Exhibits proper breathing techniques, such as pursed lip breathing or other method taught during program session;Demonstrates proper use of MDI's;Compliance with respiratory medication;Maintenance of O2 saturations>88%;Exhibits compliance with exercise, home  and travel O2 prescription             Oxygen Re-Evaluation:  Oxygen Re-Evaluation     Row Name 06/18/22 1714             Goals/Expected Outcomes   Short Term Goals To learn and exhibit  compliance with exercise, home and travel O2 prescription;To learn and understand importance of maintaining oxygen saturations>88%;To learn and demonstrate proper use of respiratory medications;To learn and demonstrate proper pursed lip breathing techniques or other breathing techniques. ;To learn and understand importance of monitoring SPO2 with pulse oximeter and demonstrate accurate use of the pulse oximeter.       Long  Term Goals Verbalizes importance of monitoring SPO2 with pulse oximeter and return demonstration;Exhibits proper breathing techniques, such as pursed lip breathing or other method taught during program session;Demonstrates proper use of MDI's;Compliance with respiratory medication;Maintenance of O2 saturations>88%;Exhibits compliance with exercise, home  and travel O2 prescription  Comments Reviewed PLB technique with pt.  Talked about how it works and it's importance in maintaining their exercise saturations.       Goals/Expected Outcomes Short: Become more profiecient at using PLB.   Long: Become independent at using PLB.                Oxygen Discharge (Final Oxygen Re-Evaluation):  Oxygen Re-Evaluation - 06/18/22 1714       Goals/Expected Outcomes   Short Term Goals To learn and exhibit compliance with exercise, home and travel O2 prescription;To learn and understand importance of maintaining oxygen saturations>88%;To learn and demonstrate proper use of respiratory medications;To learn and demonstrate proper pursed lip breathing techniques or other breathing techniques. ;To learn and understand importance of monitoring SPO2 with pulse oximeter and demonstrate accurate use of the pulse oximeter.    Long  Term Goals Verbalizes importance of monitoring SPO2 with pulse oximeter and return demonstration;Exhibits proper breathing techniques, such as pursed lip breathing or other method taught during program session;Demonstrates proper use of MDI's;Compliance with respiratory  medication;Maintenance of O2 saturations>88%;Exhibits compliance with exercise, home  and travel O2 prescription    Comments Reviewed PLB technique with pt.  Talked about how it works and it's importance in maintaining their exercise saturations.    Goals/Expected Outcomes Short: Become more profiecient at using PLB.   Long: Become independent at using PLB.             Initial Exercise Prescription:  Initial Exercise Prescription - 06/11/22 1600       Date of Initial Exercise RX and Referring Provider   Date 06/11/22    Referring Provider Aleskerov      Oxygen   Maintain Oxygen Saturation 88% or higher      NuStep   Level 1    SPM 80    Minutes 15    METs 1.7      Biostep-RELP   Level 1    SPM 50    Minutes 15    METs 1.7      Track   Laps 10    Minutes 15    METs 1.54      Prescription Details   Frequency (times per week) 2    Duration Progress to 30 minutes of continuous aerobic without signs/symptoms of physical distress      Intensity   THRR 40-80% of Max Heartrate 112-137    Ratings of Perceived Exertion 11-13    Perceived Dyspnea 0-4      Progression   Progression Continue to progress workloads to maintain intensity without signs/symptoms of physical distress.      Resistance Training   Training Prescription Yes    Weight 1    Reps 10-15             Perform Capillary Blood Glucose checks as needed.  Exercise Prescription Changes:   Exercise Prescription Changes     Row Name 06/11/22 1600             Response to Exercise   Blood Pressure (Admit) 122/70       Blood Pressure (Exercise) 142/82       Blood Pressure (Exit) 122/80       Heart Rate (Admit) 87 bpm       Heart Rate (Exercise) 94 bpm       Heart Rate (Exit) 84 bpm       Oxygen Saturation (Admit) 97 %       Oxygen Saturation (Exercise) 88 %  Oxygen Saturation (Exit) 96 %       Rating of Perceived Exertion (Exercise) 11       Perceived Dyspnea (Exercise) 1        Symptoms none       Comments 6 MWT results                Exercise Comments:   Exercise Comments     Row Name 06/18/22 1712           Exercise Comments First full day of exercise!  Patient was oriented to gym and equipment including functions, settings, policies, and procedures.  Patient's individual exercise prescription and treatment plan were reviewed.  All starting workloads were established based on the results of the 6 minute walk test done at initial orientation visit.  The plan for exercise progression was also introduced and progression will be customized based on patient's performance and goals.                Exercise Goals and Review:   Exercise Goals     Row Name 06/11/22 1615             Exercise Goals   Increase Physical Activity Yes       Intervention Provide advice, education, support and counseling about physical activity/exercise needs.;Develop an individualized exercise prescription for aerobic and resistive training based on initial evaluation findings, risk stratification, comorbidities and participant's personal goals.       Expected Outcomes Short Term: Attend rehab on a regular basis to increase amount of physical activity.;Long Term: Add in home exercise to make exercise part of routine and to increase amount of physical activity.;Long Term: Exercising regularly at least 3-5 days a week.       Increase Strength and Stamina Yes       Intervention Provide advice, education, support and counseling about physical activity/exercise needs.;Develop an individualized exercise prescription for aerobic and resistive training based on initial evaluation findings, risk stratification, comorbidities and participant's personal goals.       Expected Outcomes Short Term: Increase workloads from initial exercise prescription for resistance, speed, and METs.;Short Term: Perform resistance training exercises routinely during rehab and add in resistance training at  home;Long Term: Improve cardiorespiratory fitness, muscular endurance and strength as measured by increased METs and functional capacity ( )       Able to understand and use rate of perceived exertion (RPE) scale Yes       Intervention Provide education and explanation on how to use RPE scale       Expected Outcomes Short Term: Able to use RPE daily in rehab to express subjective intensity level;Long Term:  Able to use RPE to guide intensity level when exercising independently       Able to understand and use Dyspnea scale Yes       Intervention Provide education and explanation on how to use Dyspnea scale       Expected Outcomes Short Term: Able to use Dyspnea scale daily in rehab to express subjective sense of shortness of breath during exertion;Long Term: Able to use Dyspnea scale to guide intensity level when exercising independently       Knowledge and understanding of Target Heart Rate Range (THRR) Yes       Intervention Provide education and explanation of THRR including how the numbers were predicted and where they are located for reference       Expected Outcomes Short Term: Able to state/look up THRR;Long Term: Able to  use THRR to govern intensity when exercising independently;Short Term: Able to use daily as guideline for intensity in rehab       Able to check pulse independently Yes       Intervention Provide education and demonstration on how to check pulse in carotid and radial arteries.;Review the importance of being able to check your own pulse for safety during independent exercise       Expected Outcomes Short Term: Able to explain why pulse checking is important during independent exercise;Long Term: Able to check pulse independently and accurately       Understanding of Exercise Prescription Yes       Intervention Provide education, explanation, and written materials on patient's individual exercise prescription       Expected Outcomes Short Term: Able to explain program  exercise prescription;Long Term: Able to explain home exercise prescription to exercise independently                Exercise Goals Re-Evaluation :  Exercise Goals Re-Evaluation     Row Name 06/18/22 1713             Exercise Goal Re-Evaluation   Exercise Goals Review Increase Physical Activity;Able to understand and use rate of perceived exertion (RPE) scale;Knowledge and understanding of Target Heart Rate Range (THRR);Understanding of Exercise Prescription;Increase Strength and Stamina;Able to understand and use Dyspnea scale;Able to check pulse independently       Comments Reviewed RPE scale, THR and program prescription with pt today.  Pt voiced understanding and was given a copy of goals to take home.       Expected Outcomes Short: Use RPE daily to regulate intensity.  Long: Follow program prescription in THR.                Discharge Exercise Prescription (Final Exercise Prescription Changes):  Exercise Prescription Changes - 06/11/22 1600       Response to Exercise   Blood Pressure (Admit) 122/70    Blood Pressure (Exercise) 142/82    Blood Pressure (Exit) 122/80    Heart Rate (Admit) 87 bpm    Heart Rate (Exercise) 94 bpm    Heart Rate (Exit) 84 bpm    Oxygen Saturation (Admit) 97 %    Oxygen Saturation (Exercise) 88 %    Oxygen Saturation (Exit) 96 %    Rating of Perceived Exertion (Exercise) 11    Perceived Dyspnea (Exercise) 1    Symptoms none    Comments 6 MWT results             Nutrition:  Target Goals: Understanding of nutrition guidelines, daily intake of sodium 1500mg , cholesterol 200mg , calories 30% from fat and 7% or less from saturated fats, daily to have 5 or more servings of fruits and vegetables.  Education: All About Nutrition: -Group instruction provided by verbal, written material, interactive activities, discussions, models, and posters to present general guidelines for heart healthy nutrition including fat, fiber, MyPlate, the  role of sodium in heart healthy nutrition, utilization of the nutrition label, and utilization of this knowledge for meal planning. Follow up email sent as well. Written material given at graduation.   Biometrics:  Pre Biometrics - 06/11/22 1616       Pre Biometrics   Height 5' 4.25" (1.632 m)    Weight 118 lb (53.5 kg)    Waist Circumference 30 inches    Hip Circumference 37 inches    Waist to Hip Ratio 0.81 %    BMI (Calculated) 20.1  Single Leg Stand --   did not attempt to do due to lack of understanding of instructions (Dementia)             Nutrition Therapy Plan and Nutrition Goals:  Nutrition Therapy & Goals - 06/11/22 1526       Nutrition Therapy   Diet Heart healthy, low Na, COPD MNT    Protein (specify units) 75g    Fiber 25 grams    Whole Grain Foods 3 servings    Saturated Fats 16 max. grams    Fruits and Vegetables 8 servings/day    Sodium 2 grams      Personal Nutrition Goals   Nutrition Goal ST: Suggested easy meals like rotisserie chicken, microwave grains or batch prepped sweet potatoes, and frozen vegetables. Recommended adding in more nutrient dense foods that she likes into her routine like oatmeal with peanut butter or protein powder or egg whites, sweet potatoes with unsweeteed yogurt or white beans mashed in, yogurt with fruit, fruit with peanut butter, core power shake (she does not like many supplements as she has been getting bored, but she likes milkshakes), rice with added beans or lentils, etc. LT: maintain weight, meet calorie and protein needs, include more nutrient dense foods    Comments 72 y.o. F admitted to pulmonary rehab for COPD. PMHx includes moderate malnutrition (06/2021) - loss of ~60 lbs, CKD stg 3, DDD, anxiety/depression, mixed dementia. Medications reviewed lexapro, prozac, folic acid, memantine, nitrofurantoin, olanzapine, pantoprazole, quetiapine, solifenacin, vit B12. 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 He reports she is around her UBW and that she is doing better; he also reports that her appetite has improved over the past couple of months. B: ham and cheese croissant or ham and cheese biscuit L: hamburger or hot dog D: go out to eat hamburger or pizza- he is trying to cook more at home like chicken. Drinks:dr pepper and banana and pineapple milkshakes at cook out. They have tried flavored water before. She does not miss her meals and does have a lemon pound cake as well as ice cream. Suggested easy meals like rotisserie chicken, microwave grains or batch prepped sweet potatoes, and frozen vegetables. Recommended adding in more nutrient dense foods that she likes into her routine like oatmeal with peanut butter or protein powder or egg whites, sweet potatoes with unsweeteed yogurt or white beans mashed in, yogurt with fruit, fruit with peanut butter, core power shake (she does not like many supplements as she has been getting bored, but she likes milkshakes), rice with added beans or lentils, etc. Discussed heart healthy eating, high kcal/protein nutrition therapy and pulmonary MNT.      Intervention Plan   Intervention Prescribe, educate and counsel regarding individualized specific dietary modifications aiming towards targeted core components such as weight, hypertension, lipid management, diabetes, heart failure and other comorbidities.;Nutrition handout(s) given to patient.    Expected Outcomes Short Term Goal: Understand basic principles of dietary content, such as calories, fat, sodium, cholesterol and nutrients.;Short Term Goal: A plan has been developed with personal nutrition goals set during dietitian appointment.;Long Term Goal: Adherence to prescribed nutrition plan.             Nutrition Assessments:  MEDIFICTS Score Key: ?70 Need to make dietary changes  40-70 Heart Healthy Diet ? 40 Therapeutic Level Cholesterol Diet  Flowsheet Row Pulmonary Rehab from  06/11/2022 in [facility] Cardiac and Pulmonary Rehab  Picture Your  Plate Total Score on Admission 50      Picture Your Plate Scores: <16 Unhealthy dietary pattern with much room for improvement. 41-50 Dietary pattern unlikely to meet recommendations for good health and room for improvement. 51-60 More healthful dietary pattern, with some room for improvement.  >60 Healthy dietary pattern, although there may be some specific behaviors that could be improved.   Nutrition Goals Re-Evaluation:   Nutrition Goals Discharge (Final Nutrition Goals Re-Evaluation):   Psychosocial: Target Goals: Acknowledge presence or absence of significant depression and/or stress, maximize coping skills, provide positive support system. Participant is able to verbalize types and ability to use techniques and skills needed for reducing stress and depression.   Education: Stress, Anxiety, and Depression - Group verbal and visual presentation to define topics covered.  Reviews how body is impacted by stress, anxiety, and depression.  Also discusses healthy ways to reduce stress and to treat/manage anxiety and depression.  Written material given at graduation.   Education: Sleep Hygiene -Provides group verbal and written instruction about how sleep can affect your health.  Define sleep hygiene, discuss sleep cycles and impact of sleep habits. Review good sleep hygiene tips.    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 Her husband helps her with her medications. Her husband is retired and is with her all the time.      Family Dynamics   Good Support System? Yes      Barriers   Psychosocial barriers to participate in  program The patient should benefit from training in stress management and relaxation.      Screening Interventions   Interventions Encouraged to exercise;To provide support and resources with identified psychosocial needs;Provide feedback about the scores to participant    Expected Outcomes Short Term goal: Utilizing psychosocial counselor, staff and physician to assist with identification of specific Stressors or current issues interfering with healing process. Setting desired goal for each stressor or current issue identified.;Long Term Goal: Stressors or current issues are controlled or eliminated.;Short Term goal: Identification and review with participant of any Quality of Life or Depression concerns found by scoring the questionnaire.;Long Term goal: The participant improves quality of Life and PHQ9 Scores as seen by post scores and/or verbalization of changes             Quality of Life Scores:  Scores of 19 and below usually indicate a poorer quality of life in these areas.  A difference of  2-3 points is a clinically meaningful difference.  A difference of 2-3 points in the total score of the Quality of Life Index has been associated with significant improvement in overall quality of life, self-image, physical symptoms, and general health in studies assessing change in quality of life.  PHQ-9: Review Flowsheet  More data exists      06/24/2022 06/11/2022 11/04/2021 06/18/2021 10/04/2015  Depression screen PHQ 2/9  Decreased Interest 0 1 0 0 0  Down, Depressed, Hopeless 0 1 0 0 0  PHQ - 2 Score 0 2 0 0 0  Altered sleeping - 1 - - -  Tired, decreased energy - 2 - - -  Change in appetite - 0 - - -  Feeling bad  or failure about yourself  - 0 - - -  Trouble concentrating - 1 - - -  Moving slowly or fidgety/restless - 2 - - -  Suicidal thoughts - 0 - - -  PHQ-9 Score - 8 - - -  Difficult doing work/chores - Somewhat difficult - - -   Interpretation of Total Score  Total Score  Depression Severity:  1-4 = Minimal depression, 5-9 = Mild depression, 10-14 = Moderate depression, 15-19 = Moderately severe depression, 20-27 = Severe depression   Psychosocial Evaluation and Intervention:  Psychosocial Evaluation - 06/09/22 1120       Psychosocial Evaluation & Interventions   Interventions Encouraged to exercise with the program and follow exercise prescription;Relaxation education;Stress management education    Comments Irine has early onset dementia and her husband helps her and care for her. Her husband helps her with her medications. Her husband is retired and is with her all the time.    Expected Outcomes Short: Start [facility] to help with mood. Long: Maintain a healthy mental state.    Continue Psychosocial Services  Follow up required by staff             Psychosocial Re-Evaluation:   Psychosocial Discharge (Final Psychosocial Re-Evaluation):   Education: Education Goals: Education classes will be provided on a weekly basis, covering required topics. Participant will state understanding/return demonstration of topics presented.  Learning Barriers/Preferences:  Learning Barriers/Preferences - 06/09/22 1115       Learning Barriers/Preferences   Learning Barriers Inability to learn new things   early onset dementia   Learning Preferences --   husband helps her            General Pulmonary Education Topics:  Infection Prevention: - Provides verbal and written material to individual with discussion of infection control including proper hand washing and proper equipment cleaning during exercise session. Flowsheet Row Pulmonary Rehab from 06/11/2022 in [facility] Cardiac and Pulmonary Rehab  Date 06/11/22  Educator [facility]  Instruction Review Code 1- Verbalizes Understanding       Falls Prevention: - Provides verbal and written material to individual with discussion of falls prevention and safety. Flowsheet Row Pulmonary Rehab from 06/11/2022 in [facility]  Cardiac and Pulmonary Rehab  Date 06/11/22  Educator [facility]  Instruction Review Code 1- Verbalizes Understanding       Chronic Lung Disease Review: - Group verbal instruction with posters, models, PowerPoint presentations and videos,  to review new updates, new respiratory medications, new advancements in procedures and treatments. Providing information on websites and "800" numbers for continued self-education. Includes information about supplement oxygen, available portable oxygen systems, continuous and intermittent flow rates, oxygen safety, concentrators, and Medicare reimbursement for oxygen. Explanation of Pulmonary Drugs, including class, frequency, complications, importance of spacers, rinsing mouth after steroid MDI's, and proper cleaning methods for nebulizers. Review of basic lung anatomy and physiology related to function, structure, and complications of lung disease. Review of risk factors. Discussion about methods for diagnosing sleep apnea and types of masks and machines for OSA. Includes a review of the use of types of environmental controls: home humidity, furnaces, filters, dust mite/pet prevention, HEPA vacuums. Discussion about weather changes, air quality and the benefits of nasal washing. Instruction on Warning signs, infection symptoms, calling MD promptly, preventive modes, and value of vaccinations. Review of effective airway clearance, coughing and/or vibration techniques. Emphasizing that all should Create an Action Plan. Written material given at graduation. Flowsheet Row Pulmonary Rehab from 06/11/2022 in [facility] Cardiac and Pulmonary Rehab  Education need identified 06/11/22       AED/CPR: - Group verbal and written instruction with the use of models to demonstrate the basic use of the AED with the basic ABC's of resuscitation.    Anatomy and Cardiac Procedures: - Group verbal and visual presentation and models provide information about basic cardiac anatomy and function.  Reviews the testing methods done to diagnose heart disease and the outcomes of the test results. Describes the treatment choices: Medical Management, Angioplasty, or Coronary Bypass Surgery for treating various heart conditions including Myocardial Infarction, Angina, Valve Disease, and Cardiac Arrhythmias.  Written material given at graduation.   Medication Safety: - Group verbal and visual instruction to review commonly prescribed medications for heart and lung disease. Reviews the medication, class of the drug, and side effects. Includes the steps to properly store meds and maintain the prescription regimen.  Written material given at graduation.   Other: -Provides group and verbal instruction on various topics (see comments)   Knowledge Questionnaire Score:  Knowledge Questionnaire Score - 06/11/22 1619       Knowledge Questionnaire Score   Pre Score 15/18              Core Components/Risk Factors/Patient Goals at Admission:  Personal Goals and Risk Factors at Admission - 06/11/22 1619       Core Components/Risk Factors/Patient Goals on Admission    Weight Management Yes;Weight Maintenance    Intervention Weight Management: Develop a combined nutrition and exercise program designed to reach desired caloric intake, while maintaining appropriate intake of nutrient and fiber, sodium and fats, and appropriate energy expenditure required for the weight goal.;Weight Management: Provide education and appropriate resources to help participant work on and attain dietary goals.;Weight Management/Obesity: Establish reasonable short term and long term weight goals.    Admit Weight 118 lb (53.5 kg)    Goal Weight: Short Term 118 lb (53.5 kg)    Goal Weight: Long Term 118 lb (53.5 kg)    Expected Outcomes Short Term: Continue to assess and modify interventions until short term weight is achieved;Long Term: Adherence to nutrition and physical activity/exercise program aimed toward attainment  of established weight goal;Weight Maintenance: Understanding of the daily nutrition guidelines, which includes 25-35% calories from fat, 7% or less cal from saturated fats, less than 200mg  cholesterol, less than 1.5gm of sodium, & 5 or more servings of fruits and vegetables daily;Understanding recommendations for meals to include 15-35% energy as protein, 25-35% energy from fat, 35-60% energy from carbohydrates, less than 200mg  of dietary cholesterol, 20-35 gm of total fiber daily;Understanding of distribution of calorie intake throughout the day with the consumption of 4-5 meals/snacks    Improve shortness of breath with ADL's Yes    Intervention Provide education, individualized exercise plan and daily activity instruction to help decrease symptoms of SOB with activities of daily living.    Expected Outcomes Short Term: Improve cardiorespiratory fitness to achieve a reduction of symptoms when performing ADLs;Long Term: Be able to perform more ADLs without symptoms or delay the onset of symptoms             Education:Diabetes - Individual verbal and written instruction to review signs/symptoms of diabetes, desired ranges of glucose level fasting, after meals and with exercise. Acknowledge that pre and post exercise glucose checks will be done for 3 sessions at entry of program.   Know Your Numbers and Heart Failure: - Group verbal and visual instruction to discuss disease risk factors for cardiac and pulmonary disease  and treatment options.  Reviews associated critical values for Overweight/Obesity, Hypertension, Cholesterol, and Diabetes.  Discusses basics of heart failure: signs/symptoms and treatments.  Introduces Heart Failure Zone chart for action plan for heart failure.  Written material given at graduation.   Core Components/Risk Factors/Patient Goals Review:    Core Components/Risk Factors/Patient Goals at Discharge (Final Review):    ITP Comments:  ITP Comments     Row Name  06/09/22 1114 06/11/22 1603 07/02/22 0826       ITP Comments Virtual Visit completed. Patient informed on EP and RD appointment and 6 Minute walk test. Patient also informed of patient health questionnaires on My Chart. Patient Verbalizes understanding. Visit diagnosis can be found in [facility]/06/[facility]. Completed and gym orientation. Initial ITP created and sent for review to Dr. Jinny Sanders, Medical Director. 30 day review completed. ITP sent to Dr. Jinny Sanders, Medical Director of  Pulmonary Rehab. Continue with ITP unless changes are made by physician.              Comments: 30 day review

## 2022-07-13 ENCOUNTER — Emergency Department: Payer: Medicare PPO

## 2022-07-13 ENCOUNTER — Other Ambulatory Visit: Payer: Self-pay

## 2022-07-13 ENCOUNTER — Emergency Department
Admission: EM | Admit: 2022-07-13 | Discharge: 2022-07-14 | Disposition: A | Discharge status: Home / Self Care | Payer: Medicare PPO | Attending: Emergency Medicine | Admitting: Emergency Medicine

## 2022-07-13 DIAGNOSIS — K449 Diaphragmatic hernia without obstruction or gangrene: Secondary | ICD-10-CM | POA: Diagnosis not present

## 2022-07-13 DIAGNOSIS — E876 Hypokalemia: Secondary | ICD-10-CM | POA: Diagnosis not present

## 2022-07-13 DIAGNOSIS — R1010 Upper abdominal pain, unspecified: Secondary | ICD-10-CM | POA: Diagnosis present

## 2022-07-13 DIAGNOSIS — R748 Abnormal levels of other serum enzymes: Secondary | ICD-10-CM | POA: Diagnosis not present

## 2022-07-13 DIAGNOSIS — F0390 Unspecified dementia without behavioral disturbance: Secondary | ICD-10-CM | POA: Insufficient documentation

## 2022-07-13 LAB — URINALYSIS, ROUTINE W REFLEX MICROSCOPIC
Bilirubin Urine: NEGATIVE
Glucose, UA: NEGATIVE mg/dL
Ketones, ur: NEGATIVE mg/dL
Nitrite: NEGATIVE
Protein, ur: NEGATIVE mg/dL
Specific Gravity, Urine: 1.023 (ref 1.005–1.030)
pH: 5 (ref 5.0–8.0)

## 2022-07-13 LAB — COMPREHENSIVE METABOLIC PANEL
ALT: 13 U/L (ref 0–44)
AST: 26 U/L (ref 15–41)
Albumin: 3.8 g/dL (ref 3.5–5.0)
Alkaline Phosphatase: 107 U/L (ref 38–126)
Anion gap: 10 (ref 5–15)
BUN: 20 mg/dL (ref 8–23)
CO2: 23 mmol/L (ref 22–32)
Calcium: 8.9 mg/dL (ref 8.9–10.3)
Chloride: 105 mmol/L (ref 98–111)
Creatinine, Ser: 0.77 mg/dL (ref 0.44–1.00)
GFR, Estimated: >60 mL/min (ref >60)
Glucose, Bld: 100 mg/dL — ABNORMAL HIGH (ref 70–99)
Potassium: 3.3 mmol/L — ABNORMAL LOW (ref 3.5–5.1)
Sodium: 138 mmol/L (ref 135–145)
Total Bilirubin: 0.8 mg/dL (ref 0.3–1.2)
Total Protein: 7 g/dL (ref 6.5–8.1)

## 2022-07-13 LAB — CBC
HCT: 34.4 % — ABNORMAL LOW (ref 36.0–46.0)
Hemoglobin: 11.3 g/dL — ABNORMAL LOW (ref 12.0–15.0)
MCH: 29 pg (ref 26.0–34.0)
MCHC: 32.8 g/dL (ref 30.0–36.0)
MCV: 88.4 fL (ref 80.0–100.0)
Platelets: 268 10*3/uL (ref 150–400)
RBC: 3.89 MIL/uL (ref 3.87–5.11)
RDW: 13.2 % (ref 11.5–15.5)
WBC: 8.8 10*3/uL (ref 4.0–10.5)
nRBC: 0 % (ref 0.0–0.2)

## 2022-07-13 LAB — TROPONIN I (HIGH SENSITIVITY): Troponin I (High Sensitivity): 3 ng/L (ref <18)

## 2022-07-13 LAB — LIPASE, BLOOD: Lipase: 146 U/L — ABNORMAL HIGH (ref 11–51)

## 2022-07-13 MED ORDER — IOHEXOL 300 MG/ML  SOLN
100.0000 mL | Freq: Once | INTRAMUSCULAR | Status: AC | PRN
  Administered 2022-07-13: 100 mL via INTRAVENOUS

## 2022-07-13 NOTE — ED Provider Triage Note (Signed)
Emergency Medicine Provider Triage Evaluation Note  Katrina Simon, a 72 y.o. female  was evaluated in triage.  Pt complains of 2 week of persistent diarrhea.  Patient also had vomiting with onset last night.  She reported her last abdominal pain.  Patient presents with adult son who provides most of history the patient is demented at baseline.  Review of Systems  Positive: NVD Negative: FCS  Physical Exam  BP 120/71 (BP Location: Right Arm)   Pulse 75   Temp 97.9 F (36.6 C) (Oral)   Resp 17   Ht 5\' 8"  (1.727 m)   Wt 53.5 kg   LMP  (LMP Unknown)   SpO2 96%   BMI 17.93 kg/m  Gen:   Awake, no distress  NAD Resp:  Normal effort CTA MSK:   Moves extremities without difficulty  Other:  Soft, nontender  Medical Decision Making  Medically screening exam initiated at 4:29 PM.  Appropriate orders placed.  CATALEA LABRECQUE was informed that the remainder of the evaluation will be completed by another provider, this initial triage assessment does not replace that evaluation, and the importance of remaining in the ED until their evaluation is complete.  Geriatric patient with a history of dementia at baseline, presents for 1 week of diarrhea with associated nausea and vomiting.   Lissa Hoard, PA-C 07/13/22 1630

## 2022-07-13 NOTE — ED Triage Notes (Addendum)
Pt here with diarrhea x1 week that has gotten worse. Pt started vomiting last night, family states it was green. Pt also having abd pain, states it is generalized. Pt also has dementia, here with son.

## 2022-07-13 NOTE — ED Provider Notes (Signed)
[facility] Psychiatric Provider Note    Event Date/Time   First MD Initiated Contact with Patient 07/13/22 2047     (approximate)   History   Emesis   HPI  Katrina Simon is a 72 y.o. female with history of dementia who comes in with concerns for abdominal pain.  Patient has a history of dementia so the son comes in with patient.  She reportedly has had 2 weeks of diarrhea and vomiting that started last night.  There was some concern for abdominal pain.  Is difficult to tell exactly where her pain is but they report that seems more in the upper /mid abdomen area.  She has not had any diarrhea since being here.  Denies any known falls or worsening confusion.  No known urinary symptoms, chest pain, shortness of breath.   Physical Exam   Triage Vital Signs: ED Triage Vitals  Enc Vitals Group     BP 07/13/22 1610 120/71     Pulse Rate 07/13/22 1610 75     Resp 07/13/22 1611 17     Temp 07/13/22 1610 97.9 F (36.6 C)     Temp Source 07/13/22 1610 Oral     SpO2 07/13/22 1610 96 %     Weight 07/13/22 1615 117 lb 15.1 oz (53.5 kg)     Height 07/13/22 1615 5\' 8"  (1.727 m)     Head Circumference --      Peak Flow --      Pain Score 07/13/22 1614 5     Pain Loc --      Pain Edu? --      Excl. in GC? --     Most recent vital signs: Vitals:   07/13/22 1610 07/13/22 1611  BP: 120/71   Pulse: 75   Resp:  17  Temp: 97.9 F (36.6 C)   SpO2: 96%      General: Awake, no distress.  CV:  Good peripheral perfusion.  Resp:  Normal effort.  Abd:  No distention.  Some possible tenderness in the abdomen without any rebound or guarding. Other:     ED Results / Procedures / Treatments   Labs (all labs ordered are listed, but only abnormal results are displayed) Labs Reviewed  CBC - Abnormal; Notable for the following components:      Result Value   Hemoglobin 11.3 (*)    HCT 34.4 (*)    All other components within normal limits  COMPREHENSIVE METABOLIC PANEL -  Abnormal; Notable for the following components:   Potassium 3.3 (*)    Glucose, Bld 100 (*)    All other components within normal limits  LIPASE, BLOOD - Abnormal; Notable for the following components:   Lipase 146 (*)    All other components within normal limits  URINALYSIS, ROUTINE W REFLEX MICROSCOPIC - Abnormal; Notable for the following components:   Color, Urine YELLOW (*)    APPearance HAZY (*)    Hgb urine dipstick MODERATE (*)    Leukocytes,Ua SMALL (*)    Bacteria, UA RARE (*)    All other components within normal limits     EKG  My interpretation of EKG:  Normal sinus rate 77 without any ST elevation or T wave inversions, normal intervals  RADIOLOGY I have reviewed the CT had personally interpreted no evidence intracranial hemorrhage  PROCEDURES:  Critical Care performed: No  Procedures   MEDICATIONS ORDERED IN ED: Medications - No data to display   IMPRESSION /  MDM / ASSESSMENT AND PLAN / ED COURSE  I reviewed the triage vital signs and the nursing notes.   Patient's presentation is most consistent with acute presentation with potential threat to life or bodily function.  Patient is on the story and comes in with nausea vomiting diarrhea will get CT imaging to further evaluate ultrasound right upper quadrant   CT imaging is negative there is some diverticulosis without diverticulitis-CT head was negative  UA with possible UTI and given some white cells will send for culture.  CBC reassuring.  CMP slightly low potassium.  Lipase elevated  Patient is been unable to give stool sample since being here.  We discussed pros and cons of antibiotics for possible UTI given hard to tell if she has any new symptoms versus waiting for culture.  CT imaging is reassuring.  Provided copy of incidental findings.  She does have a hiatal hernia  The patient is on the cardiac monitor to evaluate for evidence of arrhythmia and/or significant heart rate changes.       FINAL CLINICAL IMPRESSION(S) / ED DIAGNOSES   Final diagnoses:  Hiatal hernia     Rx / DC Orders   ED Discharge Orders          Ordered    pantoprazole (PROTONIX) 20 MG tablet  Daily        07/14/22 0001             Note:  This document was prepared using Dragon voice recognition software and may include unintentional dictation errors.   Concha Se, MD 07/14/22 Katrina Simon

## 2022-07-13 NOTE — Discharge Instructions (Addendum)
There was no acute finding on the CT imaging she does have a hiatal hernia which is when the stomach slips up through the esophagus.  Sometimes this can cause some acid reflux and if she is not on an acid reducer I have written a prescription for that.  She can follow-up with GI as needed. We have given her a dose of antibiotic for possible UTI.  If she needs to have a ton of stooling then she should follow-up with her primary care doctor to have stool testing done.  Please return to the ER if she develops worsening symptoms fevers or any other concerns    IMPRESSION: Diverticulosis without diverticulitis. No obstructive changes are seen.   Hiatal hernia.   Aortic Atherosclerosis (ICD10-I70.0).

## 2022-07-13 NOTE — ED Notes (Signed)
Ambulated patient to the bathroom. Patient unable to have a BM at this time

## 2022-07-14 MED ORDER — PANTOPRAZOLE SODIUM 20 MG PO TBEC: 20.0000 mg | DELAYED_RELEASE_TABLET | Freq: Every day | ORAL | 0 refills | Status: DC

## 2022-07-14 MED ORDER — FOSFOMYCIN TROMETHAMINE 3 G PO PACK
3.0000 g | PACK | Freq: Once | ORAL | Status: AC
  Administered 2022-07-14: 3 g via ORAL
  Filled 2022-07-14: qty 3

## 2022-07-16 LAB — URINE CULTURE: Culture: 60000 — AB

## 2022-07-23 ENCOUNTER — Encounter: Payer: Medicare PPO | Attending: Pulmonary Disease

## 2022-07-23 DIAGNOSIS — J449 Chronic obstructive pulmonary disease, unspecified: Secondary | ICD-10-CM | POA: Insufficient documentation

## 2022-07-28 DIAGNOSIS — J449 Chronic obstructive pulmonary disease, unspecified: Secondary | ICD-10-CM

## 2022-07-28 NOTE — Progress Notes (Signed)
Patients husband called to say they will not be back to rehab due to patient not benefiting from the program. Patient has GI issues and vascular dementia that is inhibits her from exercising appropriately. Patient discharged.

## 2022-07-28 NOTE — Progress Notes (Signed)
Pulmonary Individual Treatment Plan  Patient Details  Name: Katrina Simon MRN: [id number] Date of Birth: 1950-06-24 Referring Provider:   Flowsheet Row Pulmonary Rehab from 06/11/2022 in [facility] Cardiac and Pulmonary Rehab  Referring Provider Aleskerov       Initial Encounter Date:  Flowsheet Row Pulmonary Rehab from 06/11/2022 in [facility] Cardiac and Pulmonary Rehab  Date 06/11/22       Visit Diagnosis: Chronic obstructive pulmonary disease, unspecified COPD type (HCC)  Patient's Home Medications on Admission:  Current Outpatient Medications:    albuterol (PROVENTIL HFA;VENTOLIN HFA) 108 (90 Base) MCG/ACT inhaler, Inhale into the lungs every 6 (six) hours as needed for wheezing or shortness of breath., Disp: , Rfl:    aspirin EC 81 MG tablet, Take 81 mg by mouth daily. Swallow whole., Disp: , Rfl:    Cholecalciferol 25 MCG (1000 UT) tablet, Take by mouth., Disp: , Rfl:    ciprofloxacin (CIPRO) 500 MG tablet, Take 500 mg by mouth 2 (two) times daily. (Patient not taking: Reported on 06/24/2022), Disp: , Rfl:    diclofenac Sodium (VOLTAREN) 1 % GEL, Apply 4 g topically 4 (four) times daily. (Patient not taking: Reported on 06/24/2022), Disp: 100 g, Rfl: 3   donepezil (ARICEPT) 5 MG tablet, Take by mouth., Disp: , Rfl:    escitalopram (LEXAPRO) 10 MG tablet, Take 10 mg by mouth daily. (Patient not taking: Reported on 06/24/2022), Disp: , Rfl:    escitalopram (LEXAPRO) 10 MG tablet, Take by mouth. (Patient not taking: Reported on 06/09/2022), Disp: , Rfl:    escitalopram (LEXAPRO) 20 MG tablet, Take by mouth. (Patient not taking: Reported on 06/09/2022), Disp: , Rfl:    FLUoxetine (PROZAC) 20 MG capsule, Take 60 mg by mouth every morning. (Patient not taking: Reported on 06/09/2022), Disp: , Rfl:    folic acid (FOLVITE) 1 MG tablet, Take by mouth., Disp: , Rfl:    lidocaine (LIDODERM) 5 %, Place 1 patch onto the skin daily. (Patient not taking: Reported on 06/24/2022), Disp: , Rfl:    memantine (NAMENDA)  5 MG tablet, Take 5 mg by mouth 2 (two) times daily. (Patient not taking: Reported on 06/24/2022), Disp: , Rfl:    naloxone (NARCAN) nasal spray 4 mg/0.1 mL, Place into the nose., Disp: , Rfl:    nitrofurantoin, macrocrystal-monohydrate, (MACROBID) 100 MG capsule, Take 100 mg by mouth 2 (two) times daily. (Patient not taking: Reported on 06/24/2022), Disp: , Rfl:    OLANZapine (ZYPREXA) 5 MG tablet, Take 5 mg by mouth daily. (Patient not taking: Reported on 11/04/2021), Disp: , Rfl:    Oxycodone HCl 10 MG TABS, Take 1 tablet (10 mg total) by mouth in the morning, at noon, and at bedtime., Disp: 75 tablet, Rfl: 0   [START ON 08/12/2022] Oxycodone HCl 10 MG TABS, Take 1 tablet (10 mg total) by mouth in the morning, at noon, and at bedtime., Disp: 75 tablet, Rfl: 0   pantoprazole (PROTONIX) 20 MG tablet, Take 1 tablet (20 mg total) by mouth daily., Disp: 30 tablet, Rfl: 0   QUEtiapine (SEROQUEL) 25 MG tablet, Take 25 mg by mouth at bedtime as needed. (Patient not taking: Reported on 06/09/2022), Disp: , Rfl:    QUEtiapine (SEROQUEL) 50 MG tablet, Take by mouth. (Patient not taking: Reported on 06/09/2022), Disp: , Rfl:    solifenacin (VESICARE) 10 MG tablet, Take 10 mg by mouth daily., Disp: , Rfl:    solifenacin (VESICARE) 5 MG tablet, Take by mouth. (Patient not taking: Reported on 06/09/2022),  Disp: , Rfl:    solifenacin (VESICARE) 5 MG tablet, Take by mouth. (Patient not taking: Reported on 06/09/2022), Disp: , Rfl:    sulfamethoxazole-trimethoprim (BACTRIM DS) 800-160 MG tablet, Take 1 tablet by mouth 2 (two) times daily. (Patient not taking: Reported on 06/09/2022), Disp: , Rfl:    vitamin B-12 (CYANOCOBALAMIN) 100 MCG tablet, Take 100 mcg by mouth daily., Disp: , Rfl:   Past Medical History: Past Medical History:  Diagnosis Date   Achalasia    Allergy    Arthritis    Depression    Emphysema of lung (HCC)    Fibromyalgia    Gastroesophageal reflux disease with esophagitis 01/26/2020   Formatting of  this note might be different from the original. LA Grade C noted on EGD 01/2020   GERD (gastroesophageal reflux disease)    Herpes genitalis    History of chicken pox    History of kidney stones     Tobacco Use: Social History   Tobacco Use  Smoking Status Former   Packs/day: 0.50   Years: 45.00   Additional pack years: 0.00   Total pack years: 22.50   Types: Cigarettes   Quit date: 09/17/2018   Years since quitting: 3.8  Smokeless Tobacco Never    Labs: Review Flowsheet       Latest Ref Rng & Units 07/26/2020 06/20/2021  Labs for ITP Cardiac and Pulmonary Rehab  Bicarbonate 20.0 - 28.0 mmol/L 29.8  22.6   Acid-base deficit 0.0 - 2.0 mmol/L - 5.5   O2 Saturation % 96.5  93.4      Pulmonary Assessment Scores:  Pulmonary Assessment Scores     Row Name 06/11/22 1620         ADL UCSD   ADL Phase Entry     SOB Score total --  partially completed. unable to complete form in full due to lack of understanding (dementia)     Rest 1     Walk 1     Stairs 1     Bath 1     Dress 0     Shop 0       CAT Score   CAT Score 10       mMRC Score   mMRC Score 2              UCSD: Self-administered rating of dyspnea associated with activities of daily living (ADLs) 6-point scale (0 = "not at all" to 5 = "maximal or unable to do because of breathlessness")  Scoring Scores range from 0 to 120.  Minimally important difference is 5 units  CAT: CAT can identify the health impairment of COPD patients and is better correlated with disease progression.  CAT has a scoring range of zero to 40. The CAT score is classified into four groups of low (less than 10), medium (10 - 20), high (21-30) and very high (31-40) based on the impact level of disease on health status. A CAT score over 10 suggests significant symptoms.  A worsening CAT score could be explained by an exacerbation, poor medication adherence, poor inhaler technique, or progression of COPD or comorbid conditions.  CAT MCID  is 2 points  mMRC: mMRC (Modified Medical Research Council) Dyspnea Scale is used to assess the degree of baseline functional disability in patients of respiratory disease due to dyspnea. No minimal important difference is established. A decrease in score of 1 point or greater is considered a positive change.   Pulmonary Function Assessment:  Pulmonary  Function Assessment - 06/09/22 1115       Breath   Shortness of Breath Yes;Limiting activity             Exercise Target Goals: Exercise Program Goal: Individual exercise prescription set using results from initial 6 min walk test and THRR while considering  patient's activity barriers and safety.   Exercise Prescription Goal: Initial exercise prescription builds to 30-45 minutes a day of aerobic activity, 2-3 days per week.  Home exercise guidelines will be given to patient during program as part of exercise prescription that the participant will acknowledge.  Education: Aerobic Exercise: - Group verbal and visual presentation on the components of exercise prescription. Introduces F.I.T.T principle from ACSM for exercise prescriptions.  Reviews F.I.T.T. principles of aerobic exercise including progression. Written material given at graduation.   Education: Resistance Exercise: - Group verbal and visual presentation on the components of exercise prescription. Introduces F.I.T.T principle from ACSM for exercise prescriptions  Reviews F.I.T.T. principles of resistance exercise including progression. Written material given at graduation.    Education: Exercise & Equipment Safety: - Individual verbal instruction and demonstration of equipment use and safety with use of the equipment. Flowsheet Row Pulmonary Rehab from 06/11/2022 in [facility] Cardiac and Pulmonary Rehab  Date 06/11/22  Educator [facility]  Instruction Review Code 1- Verbalizes Understanding       Education: Exercise Physiology & General Exercise Guidelines: - Group verbal and  written instruction with models to review the exercise physiology of the cardiovascular system and associated critical values. Provides general exercise guidelines with specific guidelines to those with heart or lung disease.    Education: Flexibility, Balance, Mind/Body Relaxation: - Group verbal and visual presentation with interactive activity on the components of exercise prescription. Introduces F.I.T.T principle from ACSM for exercise prescriptions. Reviews F.I.T.T. principles of flexibility and balance exercise training including progression. Also discusses the mind body connection.  Reviews various relaxation techniques to help reduce and manage stress (i.e. Deep breathing, progressive muscle relaxation, and visualization). Balance handout provided to take home. Written material given at graduation.   Activity Barriers & Risk Stratification:  Activity Barriers & Cardiac Risk Stratification - 06/11/22 1609       Activity Barriers & Cardiac Risk Stratification   Activity Barriers History of Falls;Other (comment)    Comments Dementia             6 Minute Walk:  6 Minute Walk     Row Name 06/11/22 1604         6 Minute Walk   Phase Initial     Distance 420 feet     Walk Time 6 minutes     # of Rest Breaks 0     MPH 0.8     METS 1.7     RPE 11     Perceived Dyspnea  0     VO2 Peak 6.03     Symptoms No     Resting HR 87 bpm     Resting BP 122/70     Resting Oxygen Saturation  97 %     Exercise Oxygen Saturation  during 6 min walk 88 %     Max Ex. HR 94 bpm     Max Ex. BP 142/82     2 Minute Post BP 122/80       Interval HR   1 Minute HR 89     2 Minute HR 94     3 Minute HR 89  4 Minute HR 94     5 Minute HR 94     6 Minute HR 92     2 Minute Post HR 84     Interval Heart Rate? Yes       Interval Oxygen   Interval Oxygen? Yes     Baseline Oxygen Saturation % 97 %     1 Minute Oxygen Saturation % 96 %     1 Minute Liters of Oxygen 0 L     2 Minute  Oxygen Saturation % 91 %     2 Minute Liters of Oxygen 0 L     3 Minute Oxygen Saturation % 88 %     3 Minute Liters of Oxygen 0 L     4 Minute Oxygen Saturation % 88 %     4 Minute Liters of Oxygen 0 L     5 Minute Oxygen Saturation % 92 %     5 Minute Liters of Oxygen 0 L     6 Minute Oxygen Saturation % 94 %     6 Minute Liters of Oxygen 0 L     2 Minute Post Oxygen Saturation % 96 %     2 Minute Post Liters of Oxygen 0 L             Oxygen Initial Assessment:  Oxygen Initial Assessment - 06/11/22 1620       Home Oxygen   Home Oxygen Device None    Sleep Oxygen Prescription None    Home Exercise Oxygen Prescription None    Home Resting Oxygen Prescription None      Initial 6 min Walk   Oxygen Used None      Program Oxygen Prescription   Program Oxygen Prescription None      Intervention   Short Term Goals To learn and exhibit compliance with exercise, home and travel O2 prescription;To learn and understand importance of maintaining oxygen saturations>88%;To learn and demonstrate proper use of respiratory medications;To learn and demonstrate proper pursed lip breathing techniques or other breathing techniques. ;To learn and understand importance of monitoring SPO2 with pulse oximeter and demonstrate accurate use of the pulse oximeter.    Long  Term Goals Verbalizes importance of monitoring SPO2 with pulse oximeter and return demonstration;Exhibits proper breathing techniques, such as pursed lip breathing or other method taught during program session;Demonstrates proper use of MDI's;Compliance with respiratory medication;Maintenance of O2 saturations>88%;Exhibits compliance with exercise, home  and travel O2 prescription             Oxygen Re-Evaluation:  Oxygen Re-Evaluation     Row Name 06/18/22 1714             Goals/Expected Outcomes   Short Term Goals To learn and exhibit compliance with exercise, home and travel O2 prescription;To learn and understand  importance of maintaining oxygen saturations>88%;To learn and demonstrate proper use of respiratory medications;To learn and demonstrate proper pursed lip breathing techniques or other breathing techniques. ;To learn and understand importance of monitoring SPO2 with pulse oximeter and demonstrate accurate use of the pulse oximeter.       Long  Term Goals Verbalizes importance of monitoring SPO2 with pulse oximeter and return demonstration;Exhibits proper breathing techniques, such as pursed lip breathing or other method taught during program session;Demonstrates proper use of MDI's;Compliance with respiratory medication;Maintenance of O2 saturations>88%;Exhibits compliance with exercise, home  and travel O2 prescription       Comments Reviewed PLB technique with pt.  Talked about  how it works and it's importance in maintaining their exercise saturations.       Goals/Expected Outcomes Short: Become more profiecient at using PLB.   Long: Become independent at using PLB.                Oxygen Discharge (Final Oxygen Re-Evaluation):  Oxygen Re-Evaluation - 06/18/22 1714       Goals/Expected Outcomes   Short Term Goals To learn and exhibit compliance with exercise, home and travel O2 prescription;To learn and understand importance of maintaining oxygen saturations>88%;To learn and demonstrate proper use of respiratory medications;To learn and demonstrate proper pursed lip breathing techniques or other breathing techniques. ;To learn and understand importance of monitoring SPO2 with pulse oximeter and demonstrate accurate use of the pulse oximeter.    Long  Term Goals Verbalizes importance of monitoring SPO2 with pulse oximeter and return demonstration;Exhibits proper breathing techniques, such as pursed lip breathing or other method taught during program session;Demonstrates proper use of MDI's;Compliance with respiratory medication;Maintenance of O2 saturations>88%;Exhibits compliance with exercise,  home  and travel O2 prescription    Comments Reviewed PLB technique with pt.  Talked about how it works and it's importance in maintaining their exercise saturations.    Goals/Expected Outcomes Short: Become more profiecient at using PLB.   Long: Become independent at using PLB.             Initial Exercise Prescription:  Initial Exercise Prescription - 06/11/22 1600       Date of Initial Exercise RX and Referring Provider   Date 06/11/22    Referring Provider Aleskerov      Oxygen   Maintain Oxygen Saturation 88% or higher      NuStep   Level 1    SPM 80    Minutes 15    METs 1.7      Biostep-RELP   Level 1    SPM 50    Minutes 15    METs 1.7      Track   Laps 10    Minutes 15    METs 1.54      Prescription Details   Frequency (times per week) 2    Duration Progress to 30 minutes of continuous aerobic without signs/symptoms of physical distress      Intensity   THRR 40-80% of Max Heartrate 112-137    Ratings of Perceived Exertion 11-13    Perceived Dyspnea 0-4      Progression   Progression Continue to progress workloads to maintain intensity without signs/symptoms of physical distress.      Resistance Training   Training Prescription Yes    Weight 1    Reps 10-15             Perform Capillary Blood Glucose checks as needed.  Exercise Prescription Changes:   Exercise Prescription Changes     Row Name 06/11/22 1600 07/02/22 1000           Response to Exercise   Blood Pressure (Admit) 122/70 102/64      Blood Pressure (Exercise) 142/82 104/62      Blood Pressure (Exit) 122/80 110/62      Heart Rate (Admit) 87 bpm 78 bpm      Heart Rate (Exercise) 94 bpm 93 bpm      Heart Rate (Exit) 84 bpm 81 bpm      Oxygen Saturation (Admit) 97 % 97 %      Oxygen Saturation (Exercise) 88 % 95 %  Oxygen Saturation (Exit) 96 % 98 %      Rating of Perceived Exertion (Exercise) 11 11      Perceived Dyspnea (Exercise) 1 0      Symptoms none active  dementia      Comments 6 MWT results 2nd full day of exercise      Duration -- Progress to 30 minutes of  aerobic without signs/symptoms of physical distress      Intensity -- THRR unchanged        Progression   Progression -- Continue to progress workloads to maintain intensity without signs/symptoms of physical distress.      Average METs -- 1.17        Resistance Training   Training Prescription -- Yes      Weight -- 1 lb      Reps -- 10-15        Interval Training   Interval Training -- No        NuStep   Level -- 1      Minutes -- 15      METs -- 1.2        Track   Laps -- 6      Minutes -- 15      METs -- 1.33        Oxygen   Maintain Oxygen Saturation -- 88% or higher               Exercise Comments:   Exercise Comments     Row Name 06/18/22 1712           Exercise Comments First full day of exercise!  Patient was oriented to gym and equipment including functions, settings, policies, and procedures.  Patient's individual exercise prescription and treatment plan were reviewed.  All starting workloads were established based on the results of the 6 minute walk test done at initial orientation visit.  The plan for exercise progression was also introduced and progression will be customized based on patient's performance and goals.                Exercise Goals and Review:   Exercise Goals     Row Name 06/11/22 1615             Exercise Goals   Increase Physical Activity Yes       Intervention Provide advice, education, support and counseling about physical activity/exercise needs.;Develop an individualized exercise prescription for aerobic and resistive training based on initial evaluation findings, risk stratification, comorbidities and participant's personal goals.       Expected Outcomes Short Term: Attend rehab on a regular basis to increase amount of physical activity.;Long Term: Add in home exercise to make exercise part of routine and to  increase amount of physical activity.;Long Term: Exercising regularly at least 3-5 days a week.       Increase Strength and Stamina Yes       Intervention Provide advice, education, support and counseling about physical activity/exercise needs.;Develop an individualized exercise prescription for aerobic and resistive training based on initial evaluation findings, risk stratification, comorbidities and participant's personal goals.       Expected Outcomes Short Term: Increase workloads from initial exercise prescription for resistance, speed, and METs.;Short Term: Perform resistance training exercises routinely during rehab and add in resistance training at home;Long Term: Improve cardiorespiratory fitness, muscular endurance and strength as measured by increased METs and functional capacity ( )       Able to understand and use rate of  perceived exertion (RPE) scale Yes       Intervention Provide education and explanation on how to use RPE scale       Expected Outcomes Short Term: Able to use RPE daily in rehab to express subjective intensity level;Long Term:  Able to use RPE to guide intensity level when exercising independently       Able to understand and use Dyspnea scale Yes       Intervention Provide education and explanation on how to use Dyspnea scale       Expected Outcomes Short Term: Able to use Dyspnea scale daily in rehab to express subjective sense of shortness of breath during exertion;Long Term: Able to use Dyspnea scale to guide intensity level when exercising independently       Knowledge and understanding of Target Heart Rate Range (THRR) Yes       Intervention Provide education and explanation of THRR including how the numbers were predicted and where they are located for reference       Expected Outcomes Short Term: Able to state/look up THRR;Long Term: Able to use THRR to govern intensity when exercising independently;Short Term: Able to use daily as guideline for intensity in  rehab       Able to check pulse independently Yes       Intervention Provide education and demonstration on how to check pulse in carotid and radial arteries.;Review the importance of being able to check your own pulse for safety during independent exercise       Expected Outcomes Short Term: Able to explain why pulse checking is important during independent exercise;Long Term: Able to check pulse independently and accurately       Understanding of Exercise Prescription Yes       Intervention Provide education, explanation, and written materials on patient's individual exercise prescription       Expected Outcomes Short Term: Able to explain program exercise prescription;Long Term: Able to explain home exercise prescription to exercise independently                Exercise Goals Re-Evaluation :  Exercise Goals Re-Evaluation     Row Name 06/18/22 1713 07/02/22 1012 07/16/22 1345         Exercise Goal Re-Evaluation   Exercise Goals Review Increase Physical Activity;Able to understand and use rate of perceived exertion (RPE) scale;Knowledge and understanding of Target Heart Rate Range (THRR);Understanding of Exercise Prescription;Increase Strength and Stamina;Able to understand and use Dyspnea scale;Able to check pulse independently Increase Strength and Stamina;Increase Physical Activity;Understanding of Exercise Prescription Increase Strength and Stamina;Increase Physical Activity;Understanding of Exercise Prescription     Comments Reviewed RPE scale, THR and program prescription with pt today.  Pt voiced understanding and was given a copy of goals to take home. Katrina Simon has completed 2 sessions of rehab so far. She has noted mixed alzheimers with vascular dementia which plays a role with her ability to exercise. She so far has worked on the [employer] and 6 laps on the track. Due to the dementia, staff does not feel patient may benefit as much as she forgets to keep going. Staff reminds patient  how to continue the exercise so she can get the best benefit. Staff will speak with patient's husband to see if he is able to help as well. If we don't see improvement or not able to provide the assistance the patient needs, we may discharge patient due to not being appropriate. Katrina Simon has been to rehab since last review  due to various reasons. We will review plan with her and her husband on her attendance with rehab.     Expected Outcomes Short: Use RPE daily to regulate intensity.  Long: Follow program prescription in THR. Short: Continue to follow initial exercise prescription, follow continuous exercise as tolerated Long: Improve overall strength and stamina Short: Discuss plan with patient and husband Long: Graduate from [employer]              Discharge Exercise Prescription (Final Exercise Prescription Changes):  Exercise Prescription Changes - 07/02/22 1000       Response to Exercise   Blood Pressure (Admit) 102/64    Blood Pressure (Exercise) 104/62    Blood Pressure (Exit) 110/62    Heart Rate (Admit) 78 bpm    Heart Rate (Exercise) 93 bpm    Heart Rate (Exit) 81 bpm    Oxygen Saturation (Admit) 97 %    Oxygen Saturation (Exercise) 95 %    Oxygen Saturation (Exit) 98 %    Rating of Perceived Exertion (Exercise) 11    Perceived Dyspnea (Exercise) 0    Symptoms active dementia    Comments 2nd full day of exercise    Duration Progress to 30 minutes of  aerobic without signs/symptoms of physical distress    Intensity THRR unchanged      Progression   Progression Continue to progress workloads to maintain intensity without signs/symptoms of physical distress.    Average METs 1.17      Resistance Training   Training Prescription Yes    Weight 1 lb    Reps 10-15      Interval Training   Interval Training No      NuStep   Level 1    Minutes 15    METs 1.2      Track   Laps 6    Minutes 15    METs 1.33      Oxygen   Maintain Oxygen Saturation 88% or higher              Nutrition:  Target Goals: Understanding of nutrition guidelines, daily intake of sodium 1500mg , cholesterol 200mg , calories 30% from fat and 7% or less from saturated fats, daily to have 5 or more servings of fruits and vegetables.  Education: All About Nutrition: -Group instruction provided by verbal, written material, interactive activities, discussions, models, and posters to present general guidelines for heart healthy nutrition including fat, fiber, MyPlate, the role of sodium in heart healthy nutrition, utilization of the nutrition label, and utilization of this knowledge for meal planning. Follow up email sent as well. Written material given at graduation.   Biometrics:  Pre Biometrics - 06/11/22 1616       Pre Biometrics   Height 5' 4.25" (1.632 m)    Weight 118 lb (53.5 kg)    Waist Circumference 30 inches    Hip Circumference 37 inches    Waist to Hip Ratio 0.81 %    BMI (Calculated) 20.1    Single Leg Stand --   did not attempt to do due to lack of understanding of instructions (Dementia)             Nutrition Therapy Plan and Nutrition Goals:  Nutrition Therapy & Goals - 06/11/22 1526       Nutrition Therapy   Diet Heart healthy, low Na, COPD MNT    Protein (specify units) 75g    Fiber 25 grams    Whole Grain Foods 3 servings  Saturated Fats 16 max. grams    Fruits and Vegetables 8 servings/day    Sodium 2 grams      Personal Nutrition Goals   Nutrition Goal ST: Suggested easy meals like rotisserie chicken, microwave grains or batch prepped sweet potatoes, and frozen vegetables. Recommended adding in more nutrient dense foods that she likes into her routine like oatmeal with peanut butter or protein powder or egg whites, sweet potatoes with unsweeteed yogurt or white beans mashed in, yogurt with fruit, fruit with peanut butter, core power shake (she does not like many supplements as she has been getting bored, but she likes milkshakes), rice with  added beans or lentils, etc. LT: maintain weight, meet calorie and protein needs, include more nutrient dense foods    Comments 72 y.o. F admitted to pulmonary rehab for COPD. PMHx includes moderate malnutrition (06/2021) - loss of ~60 lbs, CKD stg 3, DDD, anxiety/depression, mixed dementia. Medications reviewed lexapro, prozac, folic acid, memantine, nitrofurantoin, olanzapine, pantoprazole, quetiapine, solifenacin, vit B12. 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 He reports she is around her UBW and that she is doing better; he also reports that her appetite has improved over the past couple of months. B: ham and cheese croissant or ham and cheese biscuit L: hamburger or hot dog D: go out to eat hamburger or pizza- he is trying to cook more at home like chicken. Drinks:dr pepper and banana and pineapple milkshakes at cook out. They have tried flavored water before. She does not miss her meals and does have a lemon pound cake as well as ice cream. Suggested easy meals like rotisserie chicken, microwave grains or batch prepped sweet potatoes, and frozen vegetables. Recommended adding in more nutrient dense foods that she likes into her routine like oatmeal with peanut butter or protein powder or egg whites, sweet potatoes with unsweeteed yogurt or white beans mashed in, yogurt with fruit, fruit with peanut butter, core power shake (she does not like many supplements as she has been getting bored, but she likes milkshakes), rice with added beans or lentils, etc. Discussed heart healthy eating, high kcal/protein nutrition therapy and pulmonary MNT.      Intervention Plan   Intervention Prescribe, educate and counsel regarding individualized specific dietary modifications aiming towards targeted core components such as weight, hypertension, lipid management, diabetes, heart failure and other comorbidities.;Nutrition handout(s) given to patient.    Expected Outcomes  Short Term Goal: Understand basic principles of dietary content, such as calories, fat, sodium, cholesterol and nutrients.;Short Term Goal: A plan has been developed with personal nutrition goals set during dietitian appointment.;Long Term Goal: Adherence to prescribed nutrition plan.             Nutrition Assessments:  MEDIFICTS Score Key: ?70 Need to make dietary changes  40-70 Heart Healthy Diet ? 40 Therapeutic Level Cholesterol Diet  Flowsheet Row Pulmonary Rehab from 06/11/2022 in [facility] Cardiac and Pulmonary Rehab  Picture Your Plate Total Score on Admission 50      Picture Your Plate Scores: <40 Unhealthy dietary pattern with much room for improvement. 41-50 Dietary pattern unlikely to meet recommendations for good health and room for improvement. 51-60 More healthful dietary pattern, with some room for improvement.  >60 Healthy dietary pattern, although there may be some specific behaviors that could be improved.   Nutrition Goals Re-Evaluation:   Nutrition Goals Discharge (Final Nutrition Goals Re-Evaluation):   Psychosocial: Target Goals: Acknowledge presence or  absence of significant depression and/or stress, maximize coping skills, provide positive support system. Participant is able to verbalize types and ability to use techniques and skills needed for reducing stress and depression.   Education: Stress, Anxiety, and Depression - Group verbal and visual presentation to define topics covered.  Reviews how body is impacted by stress, anxiety, and depression.  Also discusses healthy ways to reduce stress and to treat/manage anxiety and depression.  Written material given at graduation.   Education: Sleep Hygiene -Provides group verbal and written instruction about how sleep can affect your health.  Define sleep hygiene, discuss sleep cycles and impact of sleep habits. Review good sleep hygiene tips.    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 Her husband helps her with her medications. Her husband is retired and is with her all the time.      Family Dynamics   Good Support System? Yes      Barriers   Psychosocial barriers to participate in program The patient should benefit from training in stress management and relaxation.      Screening Interventions   Interventions Encouraged to exercise;To provide support and resources with identified psychosocial needs;Provide feedback about the scores to participant    Expected Outcomes Short Term goal: Utilizing psychosocial counselor, staff and physician to assist with identification of specific Stressors or current issues interfering with healing process. Setting desired goal for each stressor or current issue identified.;Long Term Goal: Stressors or current issues are controlled or eliminated.;Short Term goal: Identification and review with participant of any Quality of Life or Depression concerns found by scoring the questionnaire.;Long Term goal: The participant improves quality of Life and PHQ9 Scores as seen by post scores and/or verbalization of changes             Quality of Life Scores:  Scores of 19 and below usually indicate a poorer quality of life in these areas.  A difference of  2-3 points is a clinically meaningful difference.  A difference of 2-3 points in the total score of the Quality of Life Index has been associated with significant improvement in overall quality of life, self-image, physical symptoms, and general health in studies assessing change in quality of life.  PHQ-9: Review Flowsheet  More data exists      06/24/2022 06/11/2022 11/04/2021 06/18/2021 10/04/2015  Depression screen PHQ 2/9   Decreased Interest 0 1 0 0 0  Down, Depressed, Hopeless 0 1 0 0 0  PHQ - 2 Score 0 2 0 0 0  Altered sleeping - 1 - - -  Tired, decreased energy - 2 - - -  Change in appetite - 0 - - -  Feeling bad or failure about yourself  - 0 - - -  Trouble concentrating - 1 - - -  Moving slowly or fidgety/restless - 2 - - -  Suicidal thoughts - 0 - - -  PHQ-9 Score - 8 - - -  Difficult doing work/chores - Somewhat difficult - - -   Interpretation of Total Score  Total Score Depression Severity:  1-4 = Minimal depression, 5-9 = Mild depression, 10-14 = Moderate depression, 15-19 = Moderately severe depression, 20-27 =  Severe depression   Psychosocial Evaluation and Intervention:  Psychosocial Evaluation - 06/09/22 1120       Psychosocial Evaluation & Interventions   Interventions Encouraged to exercise with the program and follow exercise prescription;Relaxation education;Stress management education    Comments 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 Her husband helps her with her medications. Her husband is retired and is with her all the time.    Expected Outcomes Short: Start [facility] to help with mood. Long: Maintain a healthy mental state.    Continue Psychosocial Services  Follow up required by staff             Psychosocial Re-Evaluation:   Psychosocial Discharge (Final Psychosocial Re-Evaluation):   Education: Education Goals: Education classes will be provided on a weekly basis, covering required topics. Participant will state understanding/return demonstration of topics presented.  Learning Barriers/Preferences:  Learning Barriers/Preferences - 06/09/22 1115       Learning Barriers/Preferences   Learning Barriers Inability to learn new things   early onset dementia   Learning Preferences --   husband helps her            General Pulmonary Education Topics:  Infection Prevention: - Provides verbal and written material to  individual with discussion of infection control including proper hand washing and proper equipment cleaning during exercise session. Flowsheet Row Pulmonary Rehab from 06/11/2022 in [facility] Cardiac and Pulmonary Rehab  Date 06/11/22  Educator [facility]  Instruction Review Code 1- Verbalizes Understanding       Falls Prevention: - Provides verbal and written material to individual with discussion of falls prevention and safety. Flowsheet Row Pulmonary Rehab from 06/11/2022 in [facility] Cardiac and Pulmonary Rehab  Date 06/11/22  Educator [facility]  Instruction Review Code 1- Verbalizes Understanding       Chronic Lung Disease Review: - Group verbal instruction with posters, models, PowerPoint presentations and videos,  to review new updates, new respiratory medications, new advancements in procedures and treatments. Providing information on websites and "800" numbers for continued self-education. Includes information about supplement oxygen, available portable oxygen systems, continuous and intermittent flow rates, oxygen safety, concentrators, and Medicare reimbursement for oxygen. Explanation of Pulmonary Drugs, including class, frequency, complications, importance of spacers, rinsing mouth after steroid MDI's, and proper cleaning methods for nebulizers. Review of basic lung anatomy and physiology related to function, structure, and complications of lung disease. Review of risk factors. Discussion about methods for diagnosing sleep apnea and types of masks and machines for OSA. Includes a review of the use of types of environmental controls: home humidity, furnaces, filters, dust mite/pet prevention, HEPA vacuums. Discussion about weather changes, air quality and the benefits of nasal washing. Instruction on Warning signs, infection symptoms, calling MD promptly, preventive modes, and value of vaccinations. Review of effective airway clearance, coughing and/or vibration techniques. Emphasizing that all should Create an  Action Plan. Written material given at graduation. Flowsheet Row Pulmonary Rehab from 06/11/2022 in [facility] Cardiac and Pulmonary Rehab  Education need identified 06/11/22       AED/CPR: - Group verbal and written instruction with the use of models to demonstrate the basic use of the AED with the basic ABC's of resuscitation.    Anatomy and Cardiac Procedures: - Group verbal and visual presentation and models provide information about basic cardiac anatomy and function. Reviews the testing methods done to diagnose heart disease and the outcomes of the test results. Describes the treatment choices: Medical Management, Angioplasty, or Coronary  Bypass Surgery for treating various heart conditions including Myocardial Infarction, Angina, Valve Disease, and Cardiac Arrhythmias.  Written material given at graduation.   Medication Safety: - Group verbal and visual instruction to review commonly prescribed medications for heart and lung disease. Reviews the medication, class of the drug, and side effects. Includes the steps to properly store meds and maintain the prescription regimen.  Written material given at graduation.   Other: -Provides group and verbal instruction on various topics (see comments)   Knowledge Questionnaire Score:  Knowledge Questionnaire Score - 06/11/22 1619       Knowledge Questionnaire Score   Pre Score 15/18              Core Components/Risk Factors/Patient Goals at Admission:  Personal Goals and Risk Factors at Admission - 06/11/22 1619       Core Components/Risk Factors/Patient Goals on Admission    Weight Management Yes;Weight Maintenance    Intervention Weight Management: Develop a combined nutrition and exercise program designed to reach desired caloric intake, while maintaining appropriate intake of nutrient and fiber, sodium and fats, and appropriate energy expenditure required for the weight goal.;Weight Management: Provide education and appropriate  resources to help participant work on and attain dietary goals.;Weight Management/Obesity: Establish reasonable short term and long term weight goals.    Admit Weight 118 lb (53.5 kg)    Goal Weight: Short Term 118 lb (53.5 kg)    Goal Weight: Long Term 118 lb (53.5 kg)    Expected Outcomes Short Term: Continue to assess and modify interventions until short term weight is achieved;Long Term: Adherence to nutrition and physical activity/exercise program aimed toward attainment of established weight goal;Weight Maintenance: Understanding of the daily nutrition guidelines, which includes 25-35% calories from fat, 7% or less cal from saturated fats, less than 200mg  cholesterol, less than 1.5gm of sodium, & 5 or more servings of fruits and vegetables daily;Understanding recommendations for meals to include 15-35% energy as protein, 25-35% energy from fat, 35-60% energy from carbohydrates, less than 200mg  of dietary cholesterol, 20-35 gm of total fiber daily;Understanding of distribution of calorie intake throughout the day with the consumption of 4-5 meals/snacks    Improve shortness of breath with ADL's Yes    Intervention Provide education, individualized exercise plan and daily activity instruction to help decrease symptoms of SOB with activities of daily living.    Expected Outcomes Short Term: Improve cardiorespiratory fitness to achieve a reduction of symptoms when performing ADLs;Long Term: Be able to perform more ADLs without symptoms or delay the onset of symptoms             Education:Diabetes - Individual verbal and written instruction to review signs/symptoms of diabetes, desired ranges of glucose level fasting, after meals and with exercise. Acknowledge that pre and post exercise glucose checks will be done for 3 sessions at entry of program.   Know Your Numbers and Heart Failure: - Group verbal and visual instruction to discuss disease risk factors for cardiac and pulmonary disease and  treatment options.  Reviews associated critical values for Overweight/Obesity, Hypertension, Cholesterol, and Diabetes.  Discusses basics of heart failure: signs/symptoms and treatments.  Introduces Heart Failure Zone chart for action plan for heart failure.  Written material given at graduation.   Core Components/Risk Factors/Patient Goals Review:    Core Components/Risk Factors/Patient Goals at Discharge (Final Review):    ITP Comments:  ITP Comments     Row Name 06/09/22 1114 06/11/22 1603 07/02/22 0826 07/28/22 1554  ITP Comments Virtual Visit completed. Patient informed on EP and RD appointment and 6 Minute walk test. Patient also informed of patient health questionnaires on My Chart. Patient Verbalizes understanding. Visit diagnosis can be found in [facility] 05/06/[facility]22. Completed and gym orientation. Initial ITP created and sent for review to Dr. Jinny Sanders, Medical Director. 30 day review completed. ITP sent to Dr. Jinny Sanders, Medical Director of  Pulmonary Rehab. Continue with ITP unless changes are made by physician. Patients husband called to say they will not be back to rehab due to patient not benefiting from the program. Patient has GI issues and vascular dementia that is inhibits her from exercising appropriately. Patient discharged.             Comments: discharge ITP.

## 2022-07-28 NOTE — Progress Notes (Deleted)
Discharge Progress Report  Patient Details  Name: Katrina Simon MRN: [id number] Date of Birth: September 01, 1950 Referring Provider:   Flowsheet Row Pulmonary Rehab from 06/11/2022 in [facility]  Referring Provider Aleskerov        Number of Visits: 6/36  Reason for Discharge:  Early Exit:  Personal  Smoking History:  Social History   Tobacco Use  Smoking Status Former   Packs/day: 0.50   Years: 45.00   Additional pack years: 0.00   Total pack years: 22.50   Types: Cigarettes   Quit date: 09/17/2018   Years since quitting: 3.8  Smokeless Tobacco Never    Diagnosis:  Chronic obstructive pulmonary disease, unspecified COPD type (HCC)  ADL UCSD:  Pulmonary Assessment Scores     Row Name 06/11/22 1620         ADL UCSD   ADL Phase Entry     SOB Score total --  partially completed. unable to complete form in full due to lack of understanding (dementia)     Rest 1     Walk 1     Stairs 1     Bath 1     Dress 0     Shop 0       CAT Score   CAT Score 10       mMRC Score   mMRC Score 2              Initial Exercise Prescription:  Initial Exercise Prescription - 06/11/22 1600       Date of Initial Exercise RX and Referring Provider   Date 06/11/22    Referring Provider Aleskerov      Oxygen   Maintain Oxygen Saturation 88% or higher      NuStep   Level 1    SPM 80    Minutes 15    METs 1.7      Biostep-RELP   Level 1    SPM 50    Minutes 15    METs 1.7      Track   Laps 10    Minutes 15    METs 1.54      Prescription Details   Frequency (times per week) 2    Duration Progress to 30 minutes of continuous aerobic without signs/symptoms of physical distress      Intensity   THRR 40-80% of Max Heartrate 112-137    Ratings of Perceived Exertion 11-13    Perceived Dyspnea 0-4      Progression   Progression Continue to progress workloads to maintain intensity without signs/symptoms of physical distress.      Resistance  Training   Training Prescription Yes    Weight 1    Reps 10-15             Discharge Exercise Prescription (Final Exercise Prescription Changes):  Exercise Prescription Changes - 07/02/22 1000       Response to Exercise   Blood Pressure (Admit) 102/64    Blood Pressure (Exercise) 104/62    Blood Pressure (Exit) 110/62    Heart Rate (Admit) 78 bpm    Heart Rate (Exercise) 93 bpm    Heart Rate (Exit) 81 bpm    Oxygen Saturation (Admit) 97 %    Oxygen Saturation (Exercise) 95 %    Oxygen Saturation (Exit) 98 %    Rating of Perceived Exertion (Exercise) 11    Perceived Dyspnea (Exercise) 0    Symptoms active dementia    Comments  2nd full day of exercise    Duration Progress to 30 minutes of  aerobic without signs/symptoms of physical distress    Intensity THRR unchanged      Progression   Progression Continue to progress workloads to maintain intensity without signs/symptoms of physical distress.    Average METs 1.17      Resistance Training   Training Prescription Yes    Weight 1 lb    Reps 10-15      Interval Training   Interval Training No      NuStep   Level 1    Minutes 15    METs 1.2      Track   Laps 6    Minutes 15    METs 1.33      Oxygen   Maintain Oxygen Saturation 88% or higher             Functional Capacity:  6 Minute Walk     Row Name 06/11/22 1604         6 Minute Walk   Phase Initial     Distance 420 feet     Walk Time 6 minutes     # of Rest Breaks 0     MPH 0.8     METS 1.7     RPE 11     Perceived Dyspnea  0     VO2 Peak 6.03     Symptoms No     Resting HR 87 bpm     Resting BP 122/70     Resting Oxygen Saturation  97 %     Exercise Oxygen Saturation  during 6 min walk 88 %     Max Ex. HR 94 bpm     Max Ex. BP 142/82     2 Minute Post BP 122/80       Interval HR   1 Minute HR 89     2 Minute HR 94     3 Minute HR 89     4 Minute HR 94     5 Minute HR 94     6 Minute HR 92     2 Minute Post HR 84      Interval Heart Rate? Yes       Interval Oxygen   Interval Oxygen? Yes     Baseline Oxygen Saturation % 97 %     1 Minute Oxygen Saturation % 96 %     1 Minute Liters of Oxygen 0 L     2 Minute Oxygen Saturation % 91 %     2 Minute Liters of Oxygen 0 L     3 Minute Oxygen Saturation % 88 %     3 Minute Liters of Oxygen 0 L     4 Minute Oxygen Saturation % 88 %     4 Minute Liters of Oxygen 0 L     5 Minute Oxygen Saturation % 92 %     5 Minute Liters of Oxygen 0 L     6 Minute Oxygen Saturation % 94 %     6 Minute Liters of Oxygen 0 L     2 Minute Post Oxygen Saturation % 96 %     2 Minute Post Liters of Oxygen 0 L              Psychological, QOL, Others - Outcomes: PHQ 2/9:    06/24/2022    8:37 AM 06/11/2022    4:23 PM 11/04/2021  3:01 PM 06/18/2021    1:06 PM 10/04/2015    7:09 AM  Depression screen PHQ 2/9  Decreased Interest 0 1 0 0 0  Down, Depressed, Hopeless 0 1 0 0 0  PHQ - 2 Score 0 2 0 0 0  Altered sleeping  1     Tired, decreased energy  2     Change in appetite  0     Feeling bad or failure about yourself   0     Trouble concentrating  1     Moving slowly or fidgety/restless  2     Suicidal thoughts  0     PHQ-9 Score  8     Difficult doing work/chores  Somewhat difficult       Quality of Life:   Personal Goals: Goals established at orientation with interventions provided to work toward goal.  Personal Goals and Risk Factors at Admission - 06/11/22 1619       Core Components/Risk Factors/Patient Goals on Admission    Weight Management Yes;Weight Maintenance    Intervention Weight Management: Develop a combined nutrition and exercise program designed to reach desired caloric intake, while maintaining appropriate intake of nutrient and fiber, sodium and fats, and appropriate energy expenditure required for the weight goal.;Weight Management: Provide education and appropriate resources to help participant work on and attain dietary goals.;Weight  Management/Obesity: Establish reasonable short term and long term weight goals.    Admit Weight 118 lb (53.5 kg)    Goal Weight: Short Term 118 lb (53.5 kg)    Goal Weight: Long Term 118 lb (53.5 kg)    Expected Outcomes Short Term: Continue to assess and modify interventions until short term weight is achieved;Long Term: Adherence to nutrition and physical activity/exercise program aimed toward attainment of established weight goal;Weight Maintenance: Understanding of the daily nutrition guidelines, which includes 25-35% calories from fat, 7% or less cal from saturated fats, less than 200mg  cholesterol, less than 1.5gm of sodium, & 5 or more servings of fruits and vegetables daily;Understanding recommendations for meals to include 15-35% energy as protein, 25-35% energy from fat, 35-60% energy from carbohydrates, less than 200mg  of dietary cholesterol, 20-35 gm of total fiber daily;Understanding of distribution of calorie intake throughout the day with the consumption of 4-5 meals/snacks    Improve shortness of breath with ADL's Yes    Intervention Provide education, individualized exercise plan and daily activity instruction to help decrease symptoms of SOB with activities of daily living.    Expected Outcomes Short Term: Improve cardiorespiratory fitness to achieve a reduction of symptoms when performing ADLs;Long Term: Be able to perform more ADLs without symptoms or delay the onset of symptoms              Personal Goals Discharge:   Exercise Goals and Review:  Exercise Goals     Row Name 06/11/22 1615             Exercise Goals   Increase Physical Activity Yes       Intervention Provide advice, education, support and counseling about physical activity/exercise needs.;Develop an individualized exercise prescription for aerobic and resistive training based on initial evaluation findings, risk stratification, comorbidities and participant's personal goals.       Expected Outcomes  Short Term: Attend rehab on a regular basis to increase amount of physical activity.;Long Term: Add in home exercise to make exercise part of routine and to increase amount of physical activity.;Long Term: Exercising regularly at least 3-5 days a  week.       Increase Strength and Stamina Yes       Intervention Provide advice, education, support and counseling about physical activity/exercise needs.;Develop an individualized exercise prescription for aerobic and resistive training based on initial evaluation findings, risk stratification, comorbidities and participant's personal goals.       Expected Outcomes Short Term: Increase workloads from initial exercise prescription for resistance, speed, and METs.;Short Term: Perform resistance training exercises routinely during rehab and add in resistance training at home;Long Term: Improve cardiorespiratory fitness, muscular endurance and strength as measured by increased METs and functional capacity ( )       Able to understand and use rate of perceived exertion (RPE) scale Yes       Intervention Provide education and explanation on how to use RPE scale       Expected Outcomes Short Term: Able to use RPE daily in rehab to express subjective intensity level;Long Term:  Able to use RPE to guide intensity level when exercising independently       Able to understand and use Dyspnea scale Yes       Intervention Provide education and explanation on how to use Dyspnea scale       Expected Outcomes Short Term: Able to use Dyspnea scale daily in rehab to express subjective sense of shortness of breath during exertion;Long Term: Able to use Dyspnea scale to guide intensity level when exercising independently       Knowledge and understanding of Target Heart Rate Range (THRR) Yes       Intervention Provide education and explanation of THRR including how the numbers were predicted and where they are located for reference       Expected Outcomes Short Term: Able to  state/look up THRR;Long Term: Able to use THRR to govern intensity when exercising independently;Short Term: Able to use daily as guideline for intensity in rehab       Able to check pulse independently Yes       Intervention Provide education and demonstration on how to check pulse in carotid and radial arteries.;Review the importance of being able to check your own pulse for safety during independent exercise       Expected Outcomes Short Term: Able to explain why pulse checking is important during independent exercise;Long Term: Able to check pulse independently and accurately       Understanding of Exercise Prescription Yes       Intervention Provide education, explanation, and written materials on patient's individual exercise prescription       Expected Outcomes Short Term: Able to explain program exercise prescription;Long Term: Able to explain home exercise prescription to exercise independently                Exercise Goals Re-Evaluation:  Exercise Goals Re-Evaluation     Row Name 06/18/22 1713 07/02/22 1012 07/16/22 1345         Exercise Goal Re-Evaluation   Exercise Goals Review Increase Physical Activity;Able to understand and use rate of perceived exertion (RPE) scale;Knowledge and understanding of Target Heart Rate Range (THRR);Understanding of Exercise Prescription;Increase Strength and Stamina;Able to understand and use Dyspnea scale;Able to check pulse independently Increase Strength and Stamina;Increase Physical Activity;Understanding of Exercise Prescription Increase Strength and Stamina;Increase Physical Activity;Understanding of Exercise Prescription     Comments Reviewed RPE scale, THR and program prescription with pt today.  Pt voiced understanding and was given a copy of goals to take home. [facility] has completed 2 sessions of rehab so far.  She has noted mixed alzheimers with vascular dementia which plays a role with her ability to exercise. She so far has worked on the [employer] and 6 laps on the track. Due to the dementia, staff does not feel patient may benefit as much as she forgets to keep going. Staff reminds patient how to continue the exercise so she can get the best benefit. Staff will speak with patient's husband to see if he is able to help as well. If we don't see improvement or not able to provide the assistance the patient needs, we may discharge patient due to not being appropriate. Gordana has been to rehab since last review due to various reasons. We will review plan with her and her husband on her attendance with rehab.     Expected Outcomes Short: Use RPE daily to regulate intensity.  Long: Follow program prescription in THR. Short: Continue to follow initial exercise prescription, follow continuous exercise as tolerated Long: Improve overall strength and stamina Short: Discuss plan with patient and husband Long: Graduate from [employer]              Nutrition & Weight - Outcomes:  Pre Biometrics - 06/11/22 1616       Pre Biometrics   Height 5' 4.25" (1.632 m)    Weight 118 lb (53.5 kg)    Waist Circumference 30 inches    Hip Circumference 37 inches    Waist to Hip Ratio 0.81 %    BMI (Calculated) 20.1    Single Leg Stand --   did not attempt to do due to lack of understanding of instructions (Dementia)             Nutrition:  Nutrition Therapy & Goals - 06/11/22 1526       Nutrition Therapy   Diet Heart healthy, low Na, COPD MNT    Protein (specify units) 75g    Fiber 25 grams    Whole Grain Foods 3 servings    Saturated Fats 16 max. grams    Fruits and Vegetables 8 servings/day    Sodium 2 grams      Personal Nutrition Goals   Nutrition Goal ST: Suggested easy meals like rotisserie chicken, microwave grains or batch prepped sweet potatoes, and frozen vegetables. Recommended adding in more nutrient dense foods that she likes into her routine like oatmeal with peanut butter or protein powder or egg whites, sweet potatoes with  unsweeteed yogurt or white beans mashed in, yogurt with fruit, fruit with peanut butter, core power shake (she does not like many supplements as she has been getting bored, but she likes milkshakes), rice with added beans or lentils, etc. LT: maintain weight, meet calorie and protein needs, include more nutrient dense foods    Comments 72 y.o. F admitted to pulmonary rehab for COPD. PMHx includes moderate malnutrition (06/2021) - loss of ~60 lbs, CKD stg 3, DDD, anxiety/depression, mixed dementia. Medications reviewed lexapro, prozac, folic acid, memantine, nitrofurantoin, olanzapine, pantoprazole, quetiapine, solifenacin, vit B12. 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 He reports she is around her UBW and that she is doing better; he also reports that her appetite has improved over the past couple of months. B: ham and cheese croissant or ham and cheese biscuit L: hamburger or hot dog D: go out to eat hamburger or pizza- he is trying to cook more at home like chicken. Drinks:dr pepper and banana and pineapple milkshakes at  cook out. They have tried flavored water before. She does not miss her meals and does have a lemon pound cake as well as ice cream. Suggested easy meals like rotisserie chicken, microwave grains or batch prepped sweet potatoes, and frozen vegetables. Recommended adding in more nutrient dense foods that she likes into her routine like oatmeal with peanut butter or protein powder or egg whites, sweet potatoes with unsweeteed yogurt or white beans mashed in, yogurt with fruit, fruit with peanut butter, core power shake (she does not like many supplements as she has been getting bored, but she likes milkshakes), rice with added beans or lentils, etc. Discussed heart healthy eating, high kcal/protein nutrition therapy and pulmonary MNT.      Intervention Plan   Intervention Prescribe, educate and counsel regarding individualized specific dietary  modifications aiming towards targeted core components such as weight, hypertension, lipid management, diabetes, heart failure and other comorbidities.;Nutrition handout(s) given to patient.    Expected Outcomes Short Term Goal: Understand basic principles of dietary content, such as calories, fat, sodium, cholesterol and nutrients.;Short Term Goal: A plan has been developed with personal nutrition goals set during dietitian appointment.;Long Term Goal: Adherence to prescribed nutrition plan.             Nutrition Discharge:   Education Questionnaire Score:  Knowledge Questionnaire Score - 06/11/22 1619       Knowledge Questionnaire Score   Pre Score 15/18             Goals reviewed with patient; copy given to patient.

## 2022-08-14 ENCOUNTER — Ambulatory Visit: Payer: Medicare PPO | Attending: Anesthesiology | Admitting: Anesthesiology

## 2022-08-14 DIAGNOSIS — M797 Fibromyalgia: Secondary | ICD-10-CM

## 2022-08-14 DIAGNOSIS — M159 Polyosteoarthritis, unspecified: Secondary | ICD-10-CM

## 2022-08-14 DIAGNOSIS — M5136 Other intervertebral disc degeneration, lumbar region: Secondary | ICD-10-CM

## 2022-08-14 DIAGNOSIS — M170 Bilateral primary osteoarthritis of knee: Secondary | ICD-10-CM

## 2022-08-14 DIAGNOSIS — M5135 Other intervertebral disc degeneration, thoracolumbar region: Secondary | ICD-10-CM

## 2022-08-14 DIAGNOSIS — G894 Chronic pain syndrome: Secondary | ICD-10-CM

## 2022-08-14 DIAGNOSIS — F1190 Opioid use, unspecified, uncomplicated: Secondary | ICD-10-CM

## 2022-08-14 DIAGNOSIS — M47816 Spondylosis without myelopathy or radiculopathy, lumbar region: Secondary | ICD-10-CM

## 2022-08-14 MED ORDER — OXYCODONE HCL 10 MG PO TABS: 10.0000 mg | ORAL_TABLET | Freq: Three times a day (TID) | ORAL | 0 refills | Status: DC

## 2022-09-05 ENCOUNTER — Encounter: Payer: Self-pay | Admitting: Anesthesiology

## 2022-09-05 DIAGNOSIS — G894 Chronic pain syndrome: Secondary | ICD-10-CM

## 2022-09-05 DIAGNOSIS — M5136 Other intervertebral disc degeneration, lumbar region: Secondary | ICD-10-CM | POA: Diagnosis not present

## 2022-09-05 DIAGNOSIS — M170 Bilateral primary osteoarthritis of knee: Secondary | ICD-10-CM

## 2022-09-05 DIAGNOSIS — M797 Fibromyalgia: Secondary | ICD-10-CM

## 2022-09-05 DIAGNOSIS — M5135 Other intervertebral disc degeneration, thoracolumbar region: Secondary | ICD-10-CM | POA: Diagnosis not present

## 2022-09-05 DIAGNOSIS — F1190 Opioid use, unspecified, uncomplicated: Secondary | ICD-10-CM

## 2022-09-05 DIAGNOSIS — M47816 Spondylosis without myelopathy or radiculopathy, lumbar region: Secondary | ICD-10-CM

## 2022-09-05 NOTE — Progress Notes (Signed)
I tried to call patient but was unable to establish contact on multiple occasions.  Will attempt to schedule for follow-up.  Dr. Pernell Dupre  Virtual Visit via Telephone Note  I connected with Katrina Simon on 09/05/22 at  1:00 PM EDT by telephone and verified that I am speaking with the correct person using two identifiers.  Location: Patient: Home Provider: [facility]   I discussed the limitations, risks, security and privacy concerns of performing an evaluation and management service by telephone and the availability of in person appointments. I also discussed with the patient that there may be a patient responsible charge related to this service. The patient expressed understanding and agreed to proceed.   History of Present Illness: As above   Observations/Objective:  Current Outpatient Medications:    albuterol (PROVENTIL HFA;VENTOLIN HFA) 108 (90 Base) MCG/ACT inhaler, Inhale into the lungs every 6 (six) hours as needed for wheezing or shortness of breath., Disp: , Rfl:    aspirin EC 81 MG tablet, Take 81 mg by mouth daily. Swallow whole., Disp: , Rfl:    Cholecalciferol 25 MCG (1000 UT) tablet, Take by mouth., Disp: , Rfl:    ciprofloxacin (CIPRO) 500 MG tablet, Take 500 mg by mouth 2 (two) times daily. (Patient not taking: Reported on 06/24/2022), Disp: , Rfl:    diclofenac Sodium (VOLTAREN) 1 % GEL, Apply 4 g topically 4 (four) times daily. (Patient not taking: Reported on 06/24/2022), Disp: 100 g, Rfl: 3   donepezil (ARICEPT) 5 MG tablet, Take by mouth., Disp: , Rfl:    escitalopram (LEXAPRO) 10 MG tablet, Take 10 mg by mouth daily. (Patient not taking: Reported on 06/24/2022), Disp: , Rfl:    escitalopram (LEXAPRO) 10 MG tablet, Take by mouth. (Patient not taking: Reported on 06/09/2022), Disp: , Rfl:    escitalopram (LEXAPRO) 20 MG tablet, Take by mouth. (Patient not taking: Reported on 06/09/2022), Disp: , Rfl:    FLUoxetine (PROZAC) 20 MG capsule, Take 60 mg by mouth every  morning. (Patient not taking: Reported on 06/09/2022), Disp: , Rfl:    folic acid (FOLVITE) 1 MG tablet, Take by mouth., Disp: , Rfl:    lidocaine (LIDODERM) 5 %, Place 1 patch onto the skin daily. (Patient not taking: Reported on 06/24/2022), Disp: , Rfl:    memantine (NAMENDA) 5 MG tablet, Take 5 mg by mouth 2 (two) times daily. (Patient not taking: Reported on 06/24/2022), Disp: , Rfl:    naloxone (NARCAN) nasal spray 4 mg/0.1 mL, Place into the nose., Disp: , Rfl:    nitrofurantoin, macrocrystal-monohydrate, (MACROBID) 100 MG capsule, Take 100 mg by mouth 2 (two) times daily. (Patient not taking: Reported on 06/24/2022), Disp: , Rfl:    OLANZapine (ZYPREXA) 5 MG tablet, Take 5 mg by mouth daily. (Patient not taking: Reported on 11/04/2021), Disp: , Rfl:    [START ON 09/12/2022] Oxycodone HCl 10 MG TABS, Take 1 tablet (10 mg total) by mouth in the morning, at noon, and at bedtime., Disp: 75 tablet, Rfl: 0   pantoprazole (PROTONIX) 20 MG tablet, Take 1 tablet (20 mg total) by mouth daily., Disp: 30 tablet, Rfl: 0   QUEtiapine (SEROQUEL) 25 MG tablet, Take 25 mg by mouth at bedtime as needed. (Patient not taking: Reported on 06/09/2022), Disp: , Rfl:    QUEtiapine (SEROQUEL) 50 MG tablet, Take by mouth. (Patient not taking: Reported on 06/09/2022), Disp: , Rfl:    solifenacin (VESICARE) 10 MG tablet, Take 10 mg by mouth daily., Disp: ,  Rfl:    solifenacin (VESICARE) 5 MG tablet, Take by mouth. (Patient not taking: Reported on 06/09/2022), Disp: , Rfl:    solifenacin (VESICARE) 5 MG tablet, Take by mouth. (Patient not taking: Reported on 06/09/2022), Disp: , Rfl:    sulfamethoxazole-trimethoprim (BACTRIM DS) 800-160 MG tablet, Take 1 tablet by mouth 2 (two) times daily. (Patient not taking: Reported on 06/09/2022), Disp: , Rfl:    vitamin B-12 (CYANOCOBALAMIN) 100 MCG tablet, Take 100 mcg by mouth daily., Disp: , Rfl:    Past Medical History:  Diagnosis Date   Achalasia    Allergy    Arthritis    Depression     Emphysema of lung (HCC)    Fibromyalgia    Gastroesophageal reflux disease with esophagitis 01/26/2020   Formatting of this note might be different from the original. LA Grade C noted on EGD 01/2020   GERD (gastroesophageal reflux disease)    Herpes genitalis    History of chicken pox    History of kidney stones      Assessment and Plan: 1. DDD (degenerative disc disease), lumbar   2. DDD (degenerative disc disease), thoracolumbar   3. Primary osteoarthritis of both knees   4. Primary osteoarthritis involving multiple joints   5. Fibromyalgia   6. Facet syndrome, lumbar   7. Chronic pain syndrome   8. Chronic, continuous use of opioids   Return appointment has been requested.  Follow Up Instructions:    I discussed the assessment and treatment plan with the patient. The patient was provided an opportunity to ask questions and all were answered. The patient agreed with the plan and demonstrated an understanding of the instructions.   The patient was advised to call back or seek an in-person evaluation if the symptoms worsen or if the condition fails to improve as anticipated.  I provided 10 minutes of non-face-to-face time during this encounter.   Yevette Edwards, MD

## 2022-09-24 ENCOUNTER — Ambulatory Visit: Payer: Medicare PPO | Attending: Anesthesiology | Admitting: Anesthesiology

## 2022-09-24 ENCOUNTER — Encounter: Payer: Self-pay | Admitting: Anesthesiology

## 2022-09-24 DIAGNOSIS — M47816 Spondylosis without myelopathy or radiculopathy, lumbar region: Secondary | ICD-10-CM

## 2022-09-24 DIAGNOSIS — M170 Bilateral primary osteoarthritis of knee: Secondary | ICD-10-CM | POA: Diagnosis not present

## 2022-09-24 DIAGNOSIS — M150 Primary generalized (osteo)arthritis: Secondary | ICD-10-CM

## 2022-09-24 DIAGNOSIS — F0150 Vascular dementia without behavioral disturbance: Secondary | ICD-10-CM

## 2022-09-24 DIAGNOSIS — M5135 Other intervertebral disc degeneration, thoracolumbar region: Secondary | ICD-10-CM | POA: Diagnosis not present

## 2022-09-24 DIAGNOSIS — F0280 Dementia in other diseases classified elsewhere without behavioral disturbance: Secondary | ICD-10-CM

## 2022-09-24 DIAGNOSIS — M797 Fibromyalgia: Secondary | ICD-10-CM | POA: Diagnosis not present

## 2022-09-24 DIAGNOSIS — M5136 Other intervertebral disc degeneration, lumbar region: Secondary | ICD-10-CM | POA: Diagnosis not present

## 2022-09-24 DIAGNOSIS — M159 Polyosteoarthritis, unspecified: Secondary | ICD-10-CM

## 2022-09-24 DIAGNOSIS — F1190 Opioid use, unspecified, uncomplicated: Secondary | ICD-10-CM

## 2022-09-24 DIAGNOSIS — M51369 Other intervertebral disc degeneration, lumbar region without mention of lumbar back pain or lower extremity pain: Secondary | ICD-10-CM

## 2022-09-24 DIAGNOSIS — G894 Chronic pain syndrome: Secondary | ICD-10-CM

## 2022-09-24 DIAGNOSIS — G309 Alzheimer's disease, unspecified: Secondary | ICD-10-CM

## 2022-09-24 MED ORDER — OXYCODONE HCL 10 MG PO TABS: 10.0000 mg | ORAL_TABLET | Freq: Three times a day (TID) | ORAL | 0 refills | Status: AC

## 2022-09-24 MED ORDER — OXYCODONE HCL 10 MG PO TABS: 10.0000 mg | ORAL_TABLET | Freq: Three times a day (TID) | ORAL | 0 refills | Status: DC

## 2022-09-24 NOTE — Progress Notes (Signed)
Virtual Visit via Telephone Note  I connected with Katrina Simon on 09/24/22 at  2:00 PM EDT by telephone and verified that I am speaking with the correct person using two identifiers.  Location: Patient: Home Provider: [facility] control center   I discussed the limitations, risks, security and privacy concerns of performing an evaluation and management service by telephone and the availability of in person appointments. I also discussed with the patient that there may be a patient responsible charge related to this service. The patient expressed understanding and agreed to proceed.   History of Present Illness: I spoke with Katrina Simon via telephone and with her husband Iantha Fallen to assist.  We were unable to do the video portion of the conference.  She reports that she is doing reasonably well.  She still has some back and leg pain comparable to what she has had in the past.  She is trying to stay active but has had problems with a toe causing some difficulty with her gait shoes and causing a chronic infection.  She is scheduled to have that toe removed here in the next few weeks.  They are hopeful that increasing her activity will also help with some of the back pain and muscle spasms.  She takes her oxycodone approximately 3 times a day for total of 75 tablets/month.  She generally gets about 75% relief lasting about 6 hours or so before she gets recurrence of her same baseline low back and leg pain.  Unfortunately she has failed more conservative therapy and has been requiring chronic opioid therapy for pain relief.  The medications enable her to stay relatively active sleep better at night and perform daily household activities and functional weight.  She was unable to do this without these medications.  No side effects with the medicines are reported.  Review of systems: General: No fevers or chills Pulmonary: No shortness of breath or dyspnea Cardiac: No angina or palpitations or  lightheadedness GI: No abdominal pain or constipation Psych: No depression    Observations/Objective:  Current Outpatient Medications:    [START ON 11/11/2022] Oxycodone HCl 10 MG TABS, Take 1 tablet (10 mg total) by mouth in the morning, at noon, and at bedtime., Disp: 75 tablet, Rfl: 0   albuterol (PROVENTIL HFA;VENTOLIN HFA) 108 (90 Base) MCG/ACT inhaler, Inhale into the lungs every 6 (six) hours as needed for wheezing or shortness of breath., Disp: , Rfl:    aspirin EC 81 MG tablet, Take 81 mg by mouth daily. Swallow whole., Disp: , Rfl:    Cholecalciferol 25 MCG (1000 UT) tablet, Take by mouth., Disp: , Rfl:    ciprofloxacin (CIPRO) 500 MG tablet, Take 500 mg by mouth 2 (two) times daily. (Patient not taking: Reported on 06/24/2022), Disp: , Rfl:    diclofenac Sodium (VOLTAREN) 1 % GEL, Apply 4 g topically 4 (four) times daily. (Patient not taking: Reported on 06/24/2022), Disp: 100 g, Rfl: 3   donepezil (ARICEPT) 5 MG tablet, Take by mouth., Disp: , Rfl:    escitalopram (LEXAPRO) 10 MG tablet, Take 10 mg by mouth daily. (Patient not taking: Reported on 06/24/2022), Disp: , Rfl:    escitalopram (LEXAPRO) 10 MG tablet, Take by mouth. (Patient not taking: Reported on 06/09/2022), Disp: , Rfl:    escitalopram (LEXAPRO) 20 MG tablet, Take by mouth. (Patient not taking: Reported on 06/09/2022), Disp: , Rfl:    FLUoxetine (PROZAC) 20 MG capsule, Take 60 mg by mouth every morning. (Patient not taking: Reported  on 06/09/2022), Disp: , Rfl:    folic acid (FOLVITE) 1 MG tablet, Take by mouth., Disp: , Rfl:    lidocaine (LIDODERM) 5 %, Place 1 patch onto the skin daily. (Patient not taking: Reported on 06/24/2022), Disp: , Rfl:    memantine (NAMENDA) 5 MG tablet, Take 5 mg by mouth 2 (two) times daily. (Patient not taking: Reported on 06/24/2022), Disp: , Rfl:    naloxone (NARCAN) nasal spray 4 mg/0.1 mL, Place into the nose., Disp: , Rfl:    nitrofurantoin, macrocrystal-monohydrate, (MACROBID) 100 MG capsule,  Take 100 mg by mouth 2 (two) times daily. (Patient not taking: Reported on 06/24/2022), Disp: , Rfl:    OLANZapine (ZYPREXA) 5 MG tablet, Take 5 mg by mouth daily. (Patient not taking: Reported on 11/04/2021), Disp: , Rfl:    [START ON 10/12/2022] Oxycodone HCl 10 MG TABS, Take 1 tablet (10 mg total) by mouth in the morning, at noon, and at bedtime., Disp: 75 tablet, Rfl: 0   pantoprazole (PROTONIX) 20 MG tablet, Take 1 tablet (20 mg total) by mouth daily., Disp: 30 tablet, Rfl: 0   QUEtiapine (SEROQUEL) 25 MG tablet, Take 25 mg by mouth at bedtime as needed. (Patient not taking: Reported on 06/09/2022), Disp: , Rfl:    QUEtiapine (SEROQUEL) 50 MG tablet, Take by mouth. (Patient not taking: Reported on 06/09/2022), Disp: , Rfl:    solifenacin (VESICARE) 10 MG tablet, Take 10 mg by mouth daily., Disp: , Rfl:    solifenacin (VESICARE) 5 MG tablet, Take by mouth. (Patient not taking: Reported on 06/09/2022), Disp: , Rfl:    solifenacin (VESICARE) 5 MG tablet, Take by mouth. (Patient not taking: Reported on 06/09/2022), Disp: , Rfl:    sulfamethoxazole-trimethoprim (BACTRIM DS) 800-160 MG tablet, Take 1 tablet by mouth 2 (two) times daily. (Patient not taking: Reported on 06/09/2022), Disp: , Rfl:    vitamin B-12 (CYANOCOBALAMIN) 100 MCG tablet, Take 100 mcg by mouth daily., Disp: , Rfl:  Past Medical History:  Diagnosis Date   Achalasia    Allergy    Arthritis    Depression    Emphysema of lung (HCC)    Fibromyalgia    Gastroesophageal reflux disease with esophagitis 01/26/2020   Formatting of this note might be different from the original. LA Grade C noted on EGD 01/2020   GERD (gastroesophageal reflux disease)    Herpes genitalis    History of chicken pox    History of kidney stones      Assessment and Plan: 1. DDD (degenerative disc disease), lumbar   2. DDD (degenerative disc disease), thoracolumbar   3. Primary osteoarthritis of both knees   4. Primary osteoarthritis involving multiple  joints   5. Fibromyalgia   6. Facet syndrome, lumbar   7. Chronic pain syndrome   8. Chronic, continuous use of opioids   9. Mixed Alzheimer's and vascular dementia (HCC)    Based on our conversation it is appropriate to refill her medicines for the next 2 months.  These be dated for July 28 and August 27.  No other changes to her pharmacologic regimen will be initiated.  I encouraged her to continue walking stretching as before.  Hopefully she will recover quickly from her surgery and be able to be more active which will help with her back pain.  I have reviewed the [facility] practitioner database information and it is appropriate for these refills.  I encouraged her to continue follow-up with her primary care physicians for baseline medical  care with a scheduled return to clinic in 2 months.  Follow Up Instructions:    I discussed the assessment and treatment plan with the patient. The patient was provided an opportunity to ask questions and all were answered. The patient agreed with the plan and demonstrated an understanding of the instructions.   The patient was advised to call back or seek an in-person evaluation if the symptoms worsen or if the condition fails to improve as anticipated.  I provided 30 minutes of non-face-to-face time during this encounter.   Yevette Edwards, MD

## 2022-11-11 ENCOUNTER — Encounter: Payer: Self-pay | Admitting: Anesthesiology

## 2022-11-11 ENCOUNTER — Ambulatory Visit: Payer: Medicare PPO | Admitting: Anesthesiology

## 2022-11-12 ENCOUNTER — Ambulatory Visit: Payer: Medicare PPO | Attending: Anesthesiology | Admitting: Anesthesiology

## 2022-11-12 DIAGNOSIS — F0280 Dementia in other diseases classified elsewhere without behavioral disturbance: Secondary | ICD-10-CM

## 2022-11-12 DIAGNOSIS — M5135 Other intervertebral disc degeneration, thoracolumbar region: Secondary | ICD-10-CM | POA: Diagnosis not present

## 2022-11-12 DIAGNOSIS — M797 Fibromyalgia: Secondary | ICD-10-CM | POA: Diagnosis not present

## 2022-11-12 DIAGNOSIS — M159 Polyosteoarthritis, unspecified: Secondary | ICD-10-CM

## 2022-11-12 DIAGNOSIS — M170 Bilateral primary osteoarthritis of knee: Secondary | ICD-10-CM

## 2022-11-12 DIAGNOSIS — M47816 Spondylosis without myelopathy or radiculopathy, lumbar region: Secondary | ICD-10-CM

## 2022-11-12 DIAGNOSIS — F0150 Vascular dementia without behavioral disturbance: Secondary | ICD-10-CM

## 2022-11-12 DIAGNOSIS — M5136 Other intervertebral disc degeneration, lumbar region: Secondary | ICD-10-CM | POA: Diagnosis not present

## 2022-11-12 DIAGNOSIS — Z79891 Long term (current) use of opiate analgesic: Secondary | ICD-10-CM

## 2022-11-12 DIAGNOSIS — F1190 Opioid use, unspecified, uncomplicated: Secondary | ICD-10-CM

## 2022-11-12 DIAGNOSIS — G309 Alzheimer's disease, unspecified: Secondary | ICD-10-CM

## 2022-11-12 DIAGNOSIS — F331 Major depressive disorder, recurrent, moderate: Secondary | ICD-10-CM

## 2022-11-12 DIAGNOSIS — G894 Chronic pain syndrome: Secondary | ICD-10-CM

## 2022-11-12 MED ORDER — OXYCODONE HCL 10 MG PO TABS: 10.0000 mg | ORAL_TABLET | Freq: Three times a day (TID) | ORAL | 0 refills | Status: DC

## 2022-11-12 NOTE — Progress Notes (Signed)
Virtual Visit via Telephone Note  I connected with Marquis Lunch on 11/12/22 at  2:20 PM EDT by telephone and verified that I am speaking with the correct person using two identifiers.  Location: Patient: Home Provider: Pain control center   I discussed the limitations, risks, security and privacy concerns of performing an evaluation and management service by telephone and the availability of in person appointments. I also discussed with the patient that there may be a patient responsible charge related to this service. The patient expressed understanding and agreed to proceed.   History of Present Illness: I spoke with Roque Cash and her daughter via telephone as we were unable link for the video portion of the conference.  They report that things have basically been status quo for In regards to her pain.  She still having a fair amount of thoracic and low back pain comparable to what she has had in the past.  She also has some hip pain and fibromyalgia that causes her pain throughout the day.  She has been taking her medications as prescribed with the assistance of her family.  She does have Alzheimer disease and is able to utilize their assistance with her medication management.  They report that she continues to gain good relief with the medication with improved overall function and less frequent signs of discomfort and better sleep.  Otherwise she is reportedly in her usual state of health.  No medication side effects are noted.  Review of systems: General: No fevers or chills Pulmonary: No shortness of breath or dyspnea Cardiac: No angina or palpitations or lightheadedness GI: No abdominal pain or constipation Psych: No depression    Observations/Objective:  Current Outpatient Medications:    albuterol (PROVENTIL HFA;VENTOLIN HFA) 108 (90 Base) MCG/ACT inhaler, Inhale into the lungs every 6 (six) hours as needed for wheezing or shortness of breath., Disp: , Rfl:    aspirin EC 81 MG  tablet, Take 81 mg by mouth daily. Swallow whole., Disp: , Rfl:    Cholecalciferol 25 MCG (1000 UT) tablet, Take by mouth., Disp: , Rfl:    ciprofloxacin (CIPRO) 500 MG tablet, Take 500 mg by mouth 2 (two) times daily. (Patient not taking: Reported on 06/24/2022), Disp: , Rfl:    diclofenac Sodium (VOLTAREN) 1 % GEL, Apply 4 g topically 4 (four) times daily. (Patient not taking: Reported on 06/24/2022), Disp: 100 g, Rfl: 3   donepezil (ARICEPT) 5 MG tablet, Take by mouth., Disp: , Rfl:    escitalopram (LEXAPRO) 10 MG tablet, Take 10 mg by mouth daily. (Patient not taking: Reported on 06/24/2022), Disp: , Rfl:    escitalopram (LEXAPRO) 10 MG tablet, Take by mouth. (Patient not taking: Reported on 06/09/2022), Disp: , Rfl:    escitalopram (LEXAPRO) 20 MG tablet, Take by mouth. (Patient not taking: Reported on 06/09/2022), Disp: , Rfl:    FLUoxetine (PROZAC) 20 MG capsule, Take 60 mg by mouth every morning. (Patient not taking: Reported on 06/09/2022), Disp: , Rfl:    folic acid (FOLVITE) 1 MG tablet, Take by mouth., Disp: , Rfl:    lidocaine (LIDODERM) 5 %, Place 1 patch onto the skin daily. (Patient not taking: Reported on 06/24/2022), Disp: , Rfl:    memantine (NAMENDA) 5 MG tablet, Take 5 mg by mouth 2 (two) times daily. (Patient not taking: Reported on 06/24/2022), Disp: , Rfl:    naloxone (NARCAN) nasal spray 4 mg/0.1 mL, Place into the nose., Disp: , Rfl:    nitrofurantoin, macrocrystal-monohydrate, (MACROBID)  100 MG capsule, Take 100 mg by mouth 2 (two) times daily. (Patient not taking: Reported on 06/24/2022), Disp: , Rfl:    OLANZapine (ZYPREXA) 5 MG tablet, Take 5 mg by mouth daily. (Patient not taking: Reported on 11/04/2021), Disp: , Rfl:    Oxycodone HCl 10 MG TABS, Take 1 tablet (10 mg total) by mouth in the morning, at noon, and at bedtime., Disp: 75 tablet, Rfl: 0   pantoprazole (PROTONIX) 20 MG tablet, Take 1 tablet (20 mg total) by mouth daily., Disp: 30 tablet, Rfl: 0   QUEtiapine (SEROQUEL) 25 MG  tablet, Take 25 mg by mouth at bedtime as needed. (Patient not taking: Reported on 06/09/2022), Disp: , Rfl:    QUEtiapine (SEROQUEL) 50 MG tablet, Take by mouth. (Patient not taking: Reported on 06/09/2022), Disp: , Rfl:    solifenacin (VESICARE) 10 MG tablet, Take 10 mg by mouth daily., Disp: , Rfl:    solifenacin (VESICARE) 5 MG tablet, Take by mouth. (Patient not taking: Reported on 06/09/2022), Disp: , Rfl:    solifenacin (VESICARE) 5 MG tablet, Take by mouth. (Patient not taking: Reported on 06/09/2022), Disp: , Rfl:    sulfamethoxazole-trimethoprim (BACTRIM DS) 800-160 MG tablet, Take 1 tablet by mouth 2 (two) times daily. (Patient not taking: Reported on 06/09/2022), Disp: , Rfl:    vitamin B-12 (CYANOCOBALAMIN) 100 MCG tablet, Take 100 mcg by mouth daily., Disp: , Rfl:   Past Medical History:  Diagnosis Date   Achalasia    Allergy    Arthritis    Depression    Emphysema of lung (HCC)    Fibromyalgia    Gastroesophageal reflux disease with esophagitis 01/26/2020   Formatting of this note might be different from the original. LA Grade C noted on EGD 01/2020   GERD (gastroesophageal reflux disease)    Herpes genitalis    History of chicken pox    History of kidney stones     Assessment and Plan:  1. DDD (degenerative disc disease), lumbar   2. Primary osteoarthritis of both knees   3. DDD (degenerative disc disease), thoracolumbar   4. Primary osteoarthritis involving multiple joints   5. Fibromyalgia   6. Facet syndrome, lumbar   7. Chronic pain syndrome   8. Chronic, continuous use of opioids   9. Mixed Alzheimer's and vascular dementia (HCC)   10. Major depressive disorder, recurrent, moderate (HCC)    Based on our conversation and after review of the [facility] practitioner database information think is appropriate to refill her medicines for the next 2 months.  No other changes in her regimen will be initiated.  Will schedule her for 75-month return to clinic.  Continue  follow-up with her primary care physicians for baseline medical care. Follow Up Instructions:    I discussed the assessment and treatment plan with the patient. The patient was provided an opportunity to ask questions and all were answered. The patient agreed with the plan and demonstrated an understanding of the instructions.   The patient was advised to call back or seek an in-person evaluation if the symptoms worsen or if the condition fails to improve as anticipated.  I provided 30 minutes of non-face-to-face time during this encounter.   Yevette Edwards, MD

## 2022-11-13 ENCOUNTER — Telehealth: Payer: Self-pay | Admitting: Anesthesiology

## 2022-11-13 NOTE — Telephone Encounter (Signed)
Patient's daughter called stating Dr Pernell Dupre was supposed to change quantity to 79, but pharmacy says they only had scripts for 75. Can he sent script for remainder and correct the rest of her scripts please

## 2022-11-19 ENCOUNTER — Encounter: Payer: Self-pay | Admitting: Anesthesiology

## 2022-11-20 ENCOUNTER — Encounter: Payer: Self-pay | Admitting: *Deleted

## 2022-11-20 ENCOUNTER — Telehealth: Payer: Self-pay | Admitting: *Deleted

## 2022-11-20 ENCOUNTER — Other Ambulatory Visit: Payer: Self-pay | Admitting: *Deleted

## 2022-11-20 NOTE — Telephone Encounter (Signed)
Cancelled Rx for Oxycodone, date 12-11-22. She has already filled the one for 11-11-22

## 2022-11-26 ENCOUNTER — Telehealth: Payer: Self-pay | Admitting: *Deleted

## 2022-11-26 NOTE — Telephone Encounter (Signed)
I have sent a message to Dr Pernell Dupre about this, waiting for his reply.

## 2022-11-26 NOTE — Telephone Encounter (Signed)
Spoke back with Maralyn Sago, patient's daughter.  States that qty 75 was discussed earlier, but d/t the fact that she had a toe removed from the left foot 3rd digit and increased arthritis pain they felt it was prudent that you re-up her qty back to 90.  Daughter states that you and she had a conversation about this.  They are still giving her oxycodone 10 mg tid for pain.  She will be out of medication before her next fill of 12/11/22.  They are wondering if they can get the qty increased with an earlier fill date.

## 2022-11-28 MED ORDER — OXYCODONE HCL 10 MG PO TABS: 10.0000 mg | ORAL_TABLET | Freq: Three times a day (TID) | ORAL | 0 refills | Status: AC

## 2022-11-28 NOTE — Addendum Note (Signed)
Addended by: Yevette Edwards on: 11/28/2022 07:47 AM   Modules accepted: Orders

## 2022-11-28 NOTE — Telephone Encounter (Signed)
Returned phone call to patient's daughter, Maralyn Sago, to let her know that Fawn Kirk has sent in another Rx with qty of 90 that may be picked up on 12/03/22.  Voicemail left.

## 2023-01-05 ENCOUNTER — Encounter: Payer: Self-pay | Admitting: Anesthesiology

## 2023-01-05 ENCOUNTER — Ambulatory Visit: Payer: Medicare PPO | Attending: Anesthesiology | Admitting: Anesthesiology

## 2023-01-05 VITALS — BP 114/76 | HR 102 | Temp 97.3°F | Resp 16 | Ht 67.0 in | Wt 118.0 lb

## 2023-01-05 DIAGNOSIS — M797 Fibromyalgia: Secondary | ICD-10-CM | POA: Insufficient documentation

## 2023-01-05 DIAGNOSIS — J449 Chronic obstructive pulmonary disease, unspecified: Secondary | ICD-10-CM | POA: Insufficient documentation

## 2023-01-05 DIAGNOSIS — F1190 Opioid use, unspecified, uncomplicated: Secondary | ICD-10-CM | POA: Insufficient documentation

## 2023-01-05 DIAGNOSIS — E43 Unspecified severe protein-calorie malnutrition: Secondary | ICD-10-CM | POA: Insufficient documentation

## 2023-01-05 DIAGNOSIS — M47816 Spondylosis without myelopathy or radiculopathy, lumbar region: Secondary | ICD-10-CM | POA: Insufficient documentation

## 2023-01-05 DIAGNOSIS — G309 Alzheimer's disease, unspecified: Secondary | ICD-10-CM | POA: Insufficient documentation

## 2023-01-05 DIAGNOSIS — G894 Chronic pain syndrome: Secondary | ICD-10-CM | POA: Insufficient documentation

## 2023-01-05 DIAGNOSIS — F0280 Dementia in other diseases classified elsewhere without behavioral disturbance: Secondary | ICD-10-CM | POA: Diagnosis present

## 2023-01-05 DIAGNOSIS — M150 Primary generalized (osteo)arthritis: Secondary | ICD-10-CM | POA: Diagnosis present

## 2023-01-05 DIAGNOSIS — N1831 Chronic kidney disease, stage 3a: Secondary | ICD-10-CM | POA: Insufficient documentation

## 2023-01-05 DIAGNOSIS — F0150 Vascular dementia without behavioral disturbance: Secondary | ICD-10-CM | POA: Diagnosis present

## 2023-01-05 DIAGNOSIS — M170 Bilateral primary osteoarthritis of knee: Secondary | ICD-10-CM | POA: Diagnosis present

## 2023-01-05 MED ORDER — OXYCODONE HCL 10 MG PO TABS: 10.0000 mg | ORAL_TABLET | Freq: Three times a day (TID) | ORAL | 0 refills | Status: DC

## 2023-01-05 MED ORDER — OXYCODONE HCL 10 MG PO TABS: 10.0000 mg | ORAL_TABLET | Freq: Three times a day (TID) | ORAL | 0 refills | Status: AC | PRN

## 2023-01-05 MED ORDER — NALOXONE HCL 4 MG/0.1ML NA LIQD: NASAL | 1 refills | Status: DC

## 2023-01-05 NOTE — Progress Notes (Signed)
Subjective:  Patient ID: Katrina Simon, female    DOB: 1950-10-16  Age: 72 y.o. MRN: [id number]  CC: No chief complaint on file.   Procedure: None  HPI Katrina Simon presents for reevaluation.  She was last seen 2 months ago and presents today with her son.  Unfortunately for Katrina Simon she continues to have diffuse low back and lower extremity pain requiring chronic opioid management.  She has failed more conservative therapy but with the oxycodone 10 mg 3 times a day the son maintains that she is exhibiting signs of diminished pain and good pain control as opposed to situations when she is opioid free.  She recently ran out of medication a few days ago and has been having a lot more signs of discomfort if she goes from seated to standing or from bed to standing.  Additionally, her dementia has worsened and her short-term memory impairment has progressed.  He maintains that her long-term memory is decently intact.  He continues to monitor and administer her medications to keep her on schedule.  No side effects with the medicine are reported.  Functional lower extremity strength is maintained.  She is incontinent and requires depends.  She is staying active with assistance.  Outpatient Medications Prior to Visit  Medication Sig Dispense Refill   albuterol (PROVENTIL HFA;VENTOLIN HFA) 108 (90 Base) MCG/ACT inhaler Inhale into the lungs every 6 (six) hours as needed for wheezing or shortness of breath.     aspirin EC 81 MG tablet Take 81 mg by mouth daily. Swallow whole.     Cholecalciferol 25 MCG (1000 UT) tablet Take by mouth.     donepezil (ARICEPT) 5 MG tablet Take by mouth.     folic acid (FOLVITE) 1 MG tablet Take by mouth.     naloxone (NARCAN) nasal spray 4 mg/0.1 mL Place into the nose.     nitrofurantoin, macrocrystal-monohydrate, (MACROBID) 100 MG capsule Take 100 mg by mouth 2 (two) times daily.     pantoprazole (PROTONIX) 20 MG tablet Take 1 tablet (20 mg total) by mouth daily. 30 tablet  0   solifenacin (VESICARE) 10 MG tablet Take 10 mg by mouth daily.     vitamin B-12 (CYANOCOBALAMIN) 100 MCG tablet Take 100 mcg by mouth daily.     Oxycodone HCl 10 MG TABS Take 1 tablet by mouth 3 (three) times daily as needed.     ciprofloxacin (CIPRO) 500 MG tablet Take 500 mg by mouth 2 (two) times daily. (Patient not taking: Reported on 06/24/2022)     diclofenac Sodium (VOLTAREN) 1 % GEL Apply 4 g topically 4 (four) times daily. (Patient not taking: Reported on 06/24/2022) 100 g 3   escitalopram (LEXAPRO) 10 MG tablet Take 10 mg by mouth daily. (Patient not taking: Reported on 06/24/2022)     escitalopram (LEXAPRO) 10 MG tablet Take by mouth. (Patient not taking: Reported on 06/09/2022)     escitalopram (LEXAPRO) 20 MG tablet Take by mouth. (Patient not taking: Reported on 06/09/2022)     FLUoxetine (PROZAC) 20 MG capsule Take 60 mg by mouth every morning. (Patient not taking: Reported on 06/09/2022)     lidocaine (LIDODERM) 5 % Place 1 patch onto the skin daily. (Patient not taking: Reported on 06/24/2022)     memantine (NAMENDA) 5 MG tablet Take 5 mg by mouth 2 (two) times daily. (Patient not taking: Reported on 06/24/2022)     OLANZapine (ZYPREXA) 5 MG tablet Take 5 mg by mouth daily. (Patient  not taking: Reported on 11/04/2021)     QUEtiapine (SEROQUEL) 25 MG tablet Take 25 mg by mouth at bedtime as needed. (Patient not taking: Reported on 06/09/2022)     QUEtiapine (SEROQUEL) 50 MG tablet Take by mouth. (Patient not taking: Reported on 06/09/2022)     solifenacin (VESICARE) 5 MG tablet Take by mouth. (Patient not taking: Reported on 06/09/2022)     solifenacin (VESICARE) 5 MG tablet Take by mouth. (Patient not taking: Reported on 06/09/2022)     sulfamethoxazole-trimethoprim (BACTRIM DS) 800-160 MG tablet Take 1 tablet by mouth 2 (two) times daily. (Patient not taking: Reported on 06/09/2022)     No facility-administered medications prior to visit.    Review of Systems CNS: No confusion or  sedation Cardiac: No angina or palpitations GI: No abdominal pain or constipation Constitutional: No nausea vomiting fevers or chills  Objective:  BP 114/76   Pulse (!) 102   Temp (!) 97.3 F (36.3 C)   Resp 16   Ht 5\' 7"  (1.702 m)   Wt 118 lb (53.5 kg)   LMP  (LMP Unknown)   SpO2 98%   BMI 18.48 kg/m    BP Readings from Last 3 Encounters:  01/05/23 114/76  07/13/22 123/85  06/24/22 116/68     Wt Readings from Last 3 Encounters:  01/05/23 118 lb (53.5 kg)  07/13/22 117 lb 15.1 oz (53.5 kg)  06/24/22 118 lb (53.5 kg)     Physical Exam Pt is alert and oriented PERRL EOMI HEART IS RRR no murmur or rub LCTA no wheezing or rales MUSCULOSKELETAL reveals difficulty with going from seated to standing but can do this with assistance.  She walks with an antalgic gait.  Muscle tone and bulk is diminished with evidence of some generalized weight loss.  Labs  No results found for: "HGBA1C" Lab Results  Component Value Date   CREATININE 0.77 07/13/2022    -------------------------------------------------------------------------------------------------------------------- Lab Results  Component Value Date   WBC 8.8 07/13/2022   HGB 11.3 (L) 07/13/2022   HCT 34.4 (L) 07/13/2022   PLT 268 07/13/2022   GLUCOSE 100 (H) 07/13/2022   ALT 13 07/13/2022   AST 26 07/13/2022   NA 138 07/13/2022   K 3.3 (L) 07/13/2022   CL 105 07/13/2022   CREATININE 0.77 07/13/2022   BUN 20 07/13/2022   CO2 23 07/13/2022   TSH 0.304 (L) 07/26/2020   INR 1.1 07/26/2020    --------------------------------------------------------------------------------------------------------------------- US ABDOMEN LIMITED RUQ (LIVER/GB)  Result Date: 07/13/2022 CLINICAL DATA:  Epigastric abdominal pain EXAM: ULTRASOUND ABDOMEN LIMITED RIGHT UPPER QUADRANT COMPARISON:  CT abdomen and pelvis 07/05/2022 FINDINGS: Gallbladder: No gallstones or wall thickening visualized. No sonographic Murphy sign noted by  sonographer. Common bile duct: Diameter: 9 mm.  No intrahepatic biliary ductal dilatation. Liver: No focal lesion identified. Within normal limits in parenchymal echogenicity. Portal vein is patent on color Doppler imaging with normal direction of blood flow towards the liver. Other: None. IMPRESSION: 1. Dilated common bile duct measuring 9 mm. Recommend correlation with LFTs. If abnormal, consider further evaluation with MRCP. 2. No cholelithiasis or sonographic evidence of acute cholecystitis. Electronically Signed   By: Darliss Cheney M.D.   On: 07/13/2022 23:41   CT HEAD WO CONTRAST ( )  Result Date: 07/13/2022 CLINICAL DATA:  Altered mental status EXAM: CT HEAD WITHOUT CONTRAST TECHNIQUE: Contiguous axial images were obtained from the base of the skull through the vertex without intravenous contrast. RADIATION DOSE REDUCTION: This exam was performed according to  the departmental dose-optimization program which includes automated exposure control, adjustment of the mA and/or kV according to patient size and/or use of iterative reconstruction technique. COMPARISON:  04/14/2022 FINDINGS: Brain: No evidence of acute infarction, hemorrhage, hydrocephalus, extra-axial collection or mass lesion/mass effect. Chronic white matter ischemic changes are noted. Vascular: No hyperdense vessel or unexpected calcification. Skull: Normal. Negative for fracture or focal lesion. Sinuses/Orbits: No acute finding. Other: None. IMPRESSION: Chronic white matter ischemic changes without acute abnormality. Electronically Signed   By: Alcide Clever M.D.   On: 07/13/2022 23:04   CT ABDOMEN PELVIS W CONTRAST  Result Date: 07/13/2022 CLINICAL DATA:  Acute abdominal pain and diarrhea for 2 weeks, initial encounter EXAM: CT ABDOMEN AND PELVIS WITH CONTRAST TECHNIQUE: Multidetector CT imaging of the abdomen and pelvis was performed using the standard protocol following bolus administration of intravenous contrast. RADIATION DOSE  REDUCTION: This exam was performed according to the departmental dose-optimization program which includes automated exposure control, adjustment of the mA and/or kV according to patient size and/or use of iterative reconstruction technique. CONTRAST:  OMNIPAQUE IOHEXOL 300 MG/ML  SOLN COMPARISON:  02/25/2021 FINDINGS: Lower chest: Lung bases demonstrate mild subpleural fibrosis. No focal infiltrate or effusion is seen. Hepatobiliary: No focal liver abnormality is seen. No gallstones, gallbladder wall thickening, or biliary dilatation. Pancreas: Unremarkable. No pancreatic ductal dilatation or surrounding inflammatory changes. Spleen: Normal in size without focal abnormality. Adrenals/Urinary Tract: Adrenal glands are within normal limits. Kidneys demonstrate a normal enhancement pattern bilaterally. No renal calculi or obstructive changes are noted. The bladder is well distended. Stomach/Bowel: Scattered diverticular change of the colon is noted without definitive diverticulitis. No obstructive changes are seen. The appendix is well visualized and within normal limits. No inflammatory changes are seen. Small bowel and stomach show evidence of a hiatal hernia. Vascular/Lymphatic: Aortic atherosclerosis. No enlarged abdominal or pelvic lymph nodes. Reproductive: Uterus and bilateral adnexa are unremarkable. Other: No abdominal wall hernia or abnormality. No abdominopelvic ascites. Musculoskeletal: No acute or significant osseous findings. IMPRESSION: Diverticulosis without diverticulitis. No obstructive changes are seen. Hiatal hernia. Aortic Atherosclerosis (ICD10-I70.0). Electronically Signed   By: Alcide Clever M.D.   On: 07/13/2022 23:03     Assessment & Plan:   Diagnoses and all orders for this visit:  Primary osteoarthritis of both knees  Primary osteoarthritis involving multiple joints  Fibromyalgia  Facet syndrome, lumbar  Chronic pain syndrome -     Drug Screen 10 W/Conf,  Serum  Chronic, continuous use of opioids -     Drug Screen 10 W/Conf, Serum  Mixed Alzheimer's and vascular dementia (HCC)  Chronic kidney disease, stage 3a (HCC)  CAFL (chronic airflow limitation) (HCC)  Protein-calorie malnutrition, severe (HCC)  Other orders -     Oxycodone HCl 10 MG TABS; Take 1 tablet (10 mg total) by mouth 3 (three) times daily as needed. -     Oxycodone HCl 10 MG TABS; Take 1 tablet (10 mg total) by mouth 3 (three) times daily. -     naloxone (NARCAN) nasal spray 4 mg/0.1 mL; As directed for opioid induced respiratory depression        ----------------------------------------------------------------------------------------------------------------------  Problem List Items Addressed This Visit       Unprioritized   CAFL (chronic airflow limitation) (HCC)   Chronic kidney disease, stage 3a (HCC)   Chronic pain syndrome   Relevant Medications   Oxycodone HCl 10 MG TABS   Oxycodone HCl 10 MG TABS (Start on 02/05/2023)   Other Relevant Orders  Drug Screen 10 W/Conf, Serum   Chronic, continuous use of opioids   Relevant Orders   Drug Screen 10 W/Conf, Serum   DJD (degenerative joint disease) shoulder, sacroiliac joint  - Primary   Relevant Medications   Oxycodone HCl 10 MG TABS   Oxycodone HCl 10 MG TABS (Start on 02/05/2023)   Facet syndrome, lumbar   Relevant Medications   Oxycodone HCl 10 MG TABS   Oxycodone HCl 10 MG TABS (Start on 02/05/2023)   Fibromyalgia   Relevant Medications   Oxycodone HCl 10 MG TABS   Oxycodone HCl 10 MG TABS (Start on 02/05/2023)   Mixed Alzheimer's and vascular dementia (HCC)   Protein-calorie malnutrition, severe      ----------------------------------------------------------------------------------------------------------------------  1. Primary osteoarthritis of both knees   2. Primary osteoarthritis involving multiple joints   3. Fibromyalgia   4. Facet syndrome, lumbar   5. Chronic pain  syndrome I have reviewed the [facility] practitioner database information is appropriate for refill.  Refills be generated for the next 2 months for October 21 and November 21.  Will keep her on the 3 times a day dosing with the son's assistance.  Furthermore we will refill her Narcan for opioid overdose treatment.  Based on our current scenario which is challenging, the son feels that she is functionally better and able to be more active with the opioid management as compared to more conservative care with anti-inflammatories alone.  She exhibits fewer signs of pain with medication management.  I feel that the 3 times a day dosing appears to be working well for her and we will keep steady on that.  No side effects reported.  Better overall functional capacity is noted. - Drug Screen 10 W/Conf, Serum  6. Chronic, continuous use of opioids  - Drug Screen 10 W/Conf, Serum  7. Mixed Alzheimer's and vascular dementia [facility]) Continue follow-up with her primary care physicians for baseline medical care and neurology.  8. Chronic kidney disease, stage 3a (HCC)   9. CAFL (chronic airflow limitation) (HCC)   10. Protein-calorie malnutrition, severe (HCC)     ----------------------------------------------------------------------------------------------------------------------  I have changed Katrina Simon's Oxycodone HCl. I am also having her start on Oxycodone HCl and naloxone. Additionally, I am having her maintain her albuterol, OLANZapine, diclofenac Sodium, aspirin EC, vitamin B-12, lidocaine, naloxone, Cholecalciferol, escitalopram, folic acid, solifenacin, ciprofloxacin, donepezil, FLUoxetine, memantine, nitrofurantoin (macrocrystal-monohydrate), QUEtiapine, QUEtiapine, sulfamethoxazole-trimethoprim, escitalopram, escitalopram, solifenacin, solifenacin, and pantoprazole.   Meds ordered this encounter  Medications   Oxycodone HCl 10 MG TABS    Sig: Take 1 tablet (10 mg total) by mouth  3 (three) times daily as needed.    Dispense:  90 tablet    Refill:  0   Oxycodone HCl 10 MG TABS    Sig: Take 1 tablet (10 mg total) by mouth 3 (three) times daily.    Dispense:  90 tablet    Refill:  0   naloxone (NARCAN) nasal spray 4 mg/0.1 mL    Sig: As directed for opioid induced respiratory depression    Dispense:  1 each    Refill:  1   Patient's Medications  New Prescriptions   NALOXONE (NARCAN) NASAL SPRAY 4 MG/0.1 ML    As directed for opioid induced respiratory depression   OXYCODONE HCL 10 MG TABS    Take 1 tablet (10 mg total) by mouth 3 (three) times daily.  Previous Medications   ALBUTEROL (PROVENTIL HFA;VENTOLIN HFA) 108 (90 BASE) MCG/ACT INHALER  Inhale into the lungs every 6 (six) hours as needed for wheezing or shortness of breath.   ASPIRIN EC 81 MG TABLET    Take 81 mg by mouth daily. Swallow whole.   CHOLECALCIFEROL 25 MCG (1000 UT) TABLET    Take by mouth.   CIPROFLOXACIN (CIPRO) 500 MG TABLET    Take 500 mg by mouth 2 (two) times daily.   DICLOFENAC SODIUM (VOLTAREN) 1 % GEL    Apply 4 g topically 4 (four) times daily.   DONEPEZIL (ARICEPT) 5 MG TABLET    Take by mouth.   ESCITALOPRAM (LEXAPRO) 10 MG TABLET    Take 10 mg by mouth daily.   ESCITALOPRAM (LEXAPRO) 10 MG TABLET    Take by mouth.   ESCITALOPRAM (LEXAPRO) 20 MG TABLET    Take by mouth.   FLUOXETINE (PROZAC) 20 MG CAPSULE    Take 60 mg by mouth every morning.   FOLIC ACID (FOLVITE) 1 MG TABLET    Take by mouth.   LIDOCAINE (LIDODERM) 5 %    Place 1 patch onto the skin daily.   MEMANTINE (NAMENDA) 5 MG TABLET    Take 5 mg by mouth 2 (two) times daily.   NALOXONE (NARCAN) NASAL SPRAY 4 MG/0.1 ML    Place into the nose.   NITROFURANTOIN, MACROCRYSTAL-MONOHYDRATE, (MACROBID) 100 MG CAPSULE    Take 100 mg by mouth 2 (two) times daily.   OLANZAPINE (ZYPREXA) 5 MG TABLET    Take 5 mg by mouth daily.   PANTOPRAZOLE (PROTONIX) 20 MG TABLET    Take 1 tablet (20 mg total) by mouth daily.   QUETIAPINE  (SEROQUEL) 25 MG TABLET    Take 25 mg by mouth at bedtime as needed.   QUETIAPINE (SEROQUEL) 50 MG TABLET    Take by mouth.   SOLIFENACIN (VESICARE) 10 MG TABLET    Take 10 mg by mouth daily.   SOLIFENACIN (VESICARE) 5 MG TABLET    Take by mouth.   SOLIFENACIN (VESICARE) 5 MG TABLET    Take by mouth.   SULFAMETHOXAZOLE-TRIMETHOPRIM (BACTRIM DS) 800-160 MG TABLET    Take 1 tablet by mouth 2 (two) times daily.   VITAMIN B-12 (CYANOCOBALAMIN) 100 MCG TABLET    Take 100 mcg by mouth daily.  Modified Medications   Modified Medication Previous Medication   OXYCODONE HCL 10 MG TABS Oxycodone HCl 10 MG TABS      Take 1 tablet (10 mg total) by mouth 3 (three) times daily as needed.    Take 1 tablet by mouth 3 (three) times daily as needed.  Discontinued Medications   No medications on file   ----------------------------------------------------------------------------------------------------------------------  Follow-up: Return in about 2 months (around 03/07/2023) for evaluation, med refill.    Yevette Edwards, MD

## 2023-01-14 LAB — DRUG SCREEN 10 W/CONF, SERUM
Amphetamines, IA: NEGATIVE ng/mL
Barbiturates, IA: NEGATIVE ug/mL
Benzodiazepines, IA: NEGATIVE ng/mL
Cocaine & Metabolite, IA: NEGATIVE ng/mL
Methadone, IA: NEGATIVE ng/mL
Opiates, IA: NEGATIVE ng/mL
Oxycodones, IA: POSITIVE ng/mL — AB
Phencyclidine, IA: NEGATIVE ng/mL
Propoxyphene, IA: NEGATIVE ng/mL
THC(Marijuana) Metabolite, IA: NEGATIVE ng/mL

## 2023-01-14 LAB — OXYCODONES,MS,WB/SP RFX
Oxycocone: 18 ng/mL
Oxycodones Confirmation: POSITIVE
Oxymorphone: NEGATIVE ng/mL

## 2023-01-21 ENCOUNTER — Telehealth: Payer: Self-pay | Admitting: Anesthesiology

## 2023-01-21 ENCOUNTER — Other Ambulatory Visit: Payer: Self-pay | Admitting: *Deleted

## 2023-01-21 NOTE — Telephone Encounter (Signed)
Rx request sent to Dr. Adams. 

## 2023-01-21 NOTE — Telephone Encounter (Signed)
PT has moved and will like for patient prescriptions to be send to [facility] [address] [place] [address]. TY

## 2023-01-27 ENCOUNTER — Encounter: Payer: Self-pay | Admitting: Anesthesiology

## 2023-01-27 MED ORDER — OXYCODONE HCL 10 MG PO TABS: 10.0000 mg | ORAL_TABLET | Freq: Three times a day (TID) | ORAL | 0 refills | Status: DC

## 2023-03-03 ENCOUNTER — Encounter: Payer: Self-pay | Admitting: Anesthesiology

## 2023-03-03 ENCOUNTER — Ambulatory Visit: Payer: Medicare PPO | Attending: Anesthesiology | Admitting: Anesthesiology

## 2023-03-03 DIAGNOSIS — M47816 Spondylosis without myelopathy or radiculopathy, lumbar region: Secondary | ICD-10-CM

## 2023-03-03 DIAGNOSIS — G309 Alzheimer's disease, unspecified: Secondary | ICD-10-CM

## 2023-03-03 DIAGNOSIS — Z79891 Long term (current) use of opiate analgesic: Secondary | ICD-10-CM

## 2023-03-03 DIAGNOSIS — M797 Fibromyalgia: Secondary | ICD-10-CM

## 2023-03-03 DIAGNOSIS — F0280 Dementia in other diseases classified elsewhere without behavioral disturbance: Secondary | ICD-10-CM

## 2023-03-03 DIAGNOSIS — F1190 Opioid use, unspecified, uncomplicated: Secondary | ICD-10-CM

## 2023-03-03 DIAGNOSIS — G894 Chronic pain syndrome: Secondary | ICD-10-CM | POA: Diagnosis not present

## 2023-03-03 DIAGNOSIS — F331 Major depressive disorder, recurrent, moderate: Secondary | ICD-10-CM

## 2023-03-03 DIAGNOSIS — M5135 Other intervertebral disc degeneration, thoracolumbar region: Secondary | ICD-10-CM

## 2023-03-03 DIAGNOSIS — F0150 Vascular dementia without behavioral disturbance: Secondary | ICD-10-CM

## 2023-03-03 DIAGNOSIS — M150 Primary generalized (osteo)arthritis: Secondary | ICD-10-CM

## 2023-03-03 DIAGNOSIS — M170 Bilateral primary osteoarthritis of knee: Secondary | ICD-10-CM | POA: Diagnosis not present

## 2023-03-03 MED ORDER — OXYCODONE HCL 10 MG PO TABS: 10.0000 mg | ORAL_TABLET | Freq: Three times a day (TID) | ORAL | 0 refills | Status: AC

## 2023-03-03 MED ORDER — OXYCODONE HCL 10 MG PO TABS: 10.0000 mg | ORAL_TABLET | Freq: Three times a day (TID) | ORAL | 0 refills | Status: DC

## 2023-03-03 NOTE — Progress Notes (Signed)
Virtual Visit via Telephone Note  I connected with Katrina Simon on 03/03/23 at  3:20 PM EST by telephone and verified that I am speaking with the correct person using two identifiers.  Location: Patient: Home Provider: Pain control center   I discussed the limitations, risks, security and privacy concerns of performing an evaluation and management service by telephone and the availability of in person appointments. I also discussed with the patient that there may be a patient responsible charge related to this service. The patient expressed understanding and agreed to proceed.   History of Present Illness: I spoke with Katrina Simon's daughter who is her guardian and taking care of her full-time now.  Unfortunately Katrina Simon has continued to progress with her dementia though her physicians recently took her off the Seroquel and hopefully this will improve her overall cognitive function.  She is having some swallowing studies done additionally.  At present her daughter reports that the opioid therapy is continuing to be effective at keeping her chronic pain under control.  Based on our conversation she gets good relief with the medicine.  They note that [name] reports that she is having better pain control for about 4 to 6 hours following administration of the medication and without the medication, which they have tried, she has severe pain as reported.  The quality of the pain remained stable areas of pain are stable no change in lower extremity strength or function or bowel or bladder function have been noted per the daughter report today. Review of systems: General: No fevers or chills Pulmonary: No shortness of breath or dyspnea Cardiac: No angina or palpitations or lightheadedness GI: No abdominal pain or constipation Psych: No depression     Observations/Objective:  Current Outpatient Medications:    [START ON 04/06/2023] Oxycodone HCl 10 MG TABS, Take 1 tablet (10 mg total) by mouth 3  (three) times daily., Disp: 90 tablet, Rfl: 0   albuterol (PROVENTIL HFA;VENTOLIN HFA) 108 (90 Base) MCG/ACT inhaler, Inhale into the lungs every 6 (six) hours as needed for wheezing or shortness of breath., Disp: , Rfl:    aspirin EC 81 MG tablet, Take 81 mg by mouth daily. Swallow whole., Disp: , Rfl:    Cholecalciferol 25 MCG (1000 UT) tablet, Take by mouth., Disp: , Rfl:    ciprofloxacin (CIPRO) 500 MG tablet, Take 500 mg by mouth 2 (two) times daily. (Patient not taking: Reported on 06/24/2022), Disp: , Rfl:    diclofenac Sodium (VOLTAREN) 1 % GEL, Apply 4 g topically 4 (four) times daily. (Patient not taking: Reported on 06/24/2022), Disp: 100 g, Rfl: 3   donepezil (ARICEPT) 5 MG tablet, Take by mouth., Disp: , Rfl:    escitalopram (LEXAPRO) 10 MG tablet, Take 10 mg by mouth daily. (Patient not taking: Reported on 06/24/2022), Disp: , Rfl:    escitalopram (LEXAPRO) 10 MG tablet, Take by mouth. (Patient not taking: Reported on 06/09/2022), Disp: , Rfl:    escitalopram (LEXAPRO) 20 MG tablet, Take by mouth. (Patient not taking: Reported on 06/09/2022), Disp: , Rfl:    FLUoxetine (PROZAC) 20 MG capsule, Take 60 mg by mouth every morning. (Patient not taking: Reported on 06/09/2022), Disp: , Rfl:    folic acid (FOLVITE) 1 MG tablet, Take by mouth., Disp: , Rfl:    lidocaine (LIDODERM) 5 %, Place 1 patch onto the skin daily. (Patient not taking: Reported on 06/24/2022), Disp: , Rfl:    memantine (NAMENDA) 5 MG tablet, Take 5 mg by mouth 2 (  two) times daily. (Patient not taking: Reported on 06/24/2022), Disp: , Rfl:    naloxone (NARCAN) nasal spray 4 mg/0.1 mL, Place into the nose., Disp: , Rfl:    naloxone (NARCAN) nasal spray 4 mg/0.1 mL, As directed for opioid induced respiratory depression, Disp: 1 each, Rfl: 1   nitrofurantoin, macrocrystal-monohydrate, (MACROBID) 100 MG capsule, Take 100 mg by mouth 2 (two) times daily., Disp: , Rfl:    OLANZapine (ZYPREXA) 5 MG tablet, Take 5 mg by mouth daily. (Patient  not taking: Reported on 11/04/2021), Disp: , Rfl:    [START ON 03/07/2023] Oxycodone HCl 10 MG TABS, Take 1 tablet (10 mg total) by mouth 3 (three) times daily., Disp: 90 tablet, Rfl: 0   pantoprazole (PROTONIX) 20 MG tablet, Take 1 tablet (20 mg total) by mouth daily., Disp: 30 tablet, Rfl: 0   QUEtiapine (SEROQUEL) 25 MG tablet, Take 25 mg by mouth at bedtime as needed. (Patient not taking: Reported on 06/09/2022), Disp: , Rfl:    QUEtiapine (SEROQUEL) 50 MG tablet, Take by mouth. (Patient not taking: Reported on 06/09/2022), Disp: , Rfl:    solifenacin (VESICARE) 10 MG tablet, Take 10 mg by mouth daily., Disp: , Rfl:    solifenacin (VESICARE) 5 MG tablet, Take by mouth. (Patient not taking: Reported on 06/09/2022), Disp: , Rfl:    solifenacin (VESICARE) 5 MG tablet, Take by mouth. (Patient not taking: Reported on 06/09/2022), Disp: , Rfl:    sulfamethoxazole-trimethoprim (BACTRIM DS) 800-160 MG tablet, Take 1 tablet by mouth 2 (two) times daily. (Patient not taking: Reported on 06/09/2022), Disp: , Rfl:    vitamin B-12 (CYANOCOBALAMIN) 100 MCG tablet, Take 100 mcg by mouth daily., Disp: , Rfl:    Past Medical History:  Diagnosis Date   Achalasia    Allergy    Arthritis    Depression    Emphysema of lung (HCC)    Fibromyalgia    Gastroesophageal reflux disease with esophagitis 01/26/2020   Formatting of this note might be different from the original. LA Grade C noted on EGD 01/2020   GERD (gastroesophageal reflux disease)    Herpes genitalis    History of chicken pox    History of kidney stones    Assessment and Plan:  1. Primary osteoarthritis of both knees   2. Primary osteoarthritis involving multiple joints   3. Fibromyalgia   4. Chronic, continuous use of opioids   5. Chronic pain syndrome   6. Facet syndrome, lumbar   7. Mixed Alzheimer's and vascular dementia (HCC)   8. DDD (degenerative disc disease), thoracolumbar   9. Major depressive disorder, recurrent, moderate (HCC)     Based on our conversation with the daughter today having is appropriate to refill her medicines.  The complex situation with the care of Anthony but she does have chronic pain and this does respond favorably to the opioid therapy.  She is followed by her primary care physicians and neurologist for the complex Alzheimer dementia.  This has continued to progress unfortunately.  She does respond favorably to the opioid therapy with good relief reported and no side effects noted.  We will keep her on this at present.  Refills will be initiated for December 21 and January 20.  I have reviewed the [facility] practitioner database information is appropriate for this refill.  Continue follow-up with her primary care physicians as requested through the daughter.  We will schedule her for 85-month return to clinic. Follow Up Instructions:    I discussed  the assessment and treatment plan with the patient. The patient was provided an opportunity to ask questions and all were answered. The patient agreed with the plan and demonstrated an understanding of the instructions.   The patient was advised to call back or seek an in-person evaluation if the symptoms worsen or if the condition fails to improve as anticipated.  I provided 30 minutes of non-face-to-face time during this encounter.   Yevette Edwards, MD

## 2023-03-31 ENCOUNTER — Encounter: Payer: Self-pay | Admitting: Acute Care

## 2023-04-17 ENCOUNTER — Encounter: Payer: Self-pay | Admitting: Anesthesiology

## 2023-04-23 ENCOUNTER — Ambulatory Visit: Payer: Medicare PPO | Attending: Anesthesiology | Admitting: Anesthesiology

## 2023-04-23 ENCOUNTER — Encounter: Payer: Self-pay | Admitting: Anesthesiology

## 2023-04-23 DIAGNOSIS — Z79891 Long term (current) use of opiate analgesic: Secondary | ICD-10-CM

## 2023-04-23 DIAGNOSIS — M150 Primary generalized (osteo)arthritis: Secondary | ICD-10-CM

## 2023-04-23 DIAGNOSIS — F1190 Opioid use, unspecified, uncomplicated: Secondary | ICD-10-CM

## 2023-04-23 DIAGNOSIS — N1831 Chronic kidney disease, stage 3a: Secondary | ICD-10-CM

## 2023-04-23 DIAGNOSIS — M797 Fibromyalgia: Secondary | ICD-10-CM

## 2023-04-23 DIAGNOSIS — G894 Chronic pain syndrome: Secondary | ICD-10-CM | POA: Diagnosis not present

## 2023-04-23 DIAGNOSIS — M47816 Spondylosis without myelopathy or radiculopathy, lumbar region: Secondary | ICD-10-CM | POA: Diagnosis not present

## 2023-04-23 DIAGNOSIS — F0280 Dementia in other diseases classified elsewhere without behavioral disturbance: Secondary | ICD-10-CM

## 2023-04-23 DIAGNOSIS — M170 Bilateral primary osteoarthritis of knee: Secondary | ICD-10-CM | POA: Diagnosis not present

## 2023-04-23 DIAGNOSIS — F0150 Vascular dementia without behavioral disturbance: Secondary | ICD-10-CM

## 2023-04-23 DIAGNOSIS — G309 Alzheimer's disease, unspecified: Secondary | ICD-10-CM

## 2023-04-23 IMAGING — CR DG CHEST 2V
1 series · 2 of 2 positions shown · non-contrast
Comparison: July 25, 2020

CLINICAL DATA: History of COVID 9 weeks ago, presenting with cough.

EXAM:
CHEST - 2 VIEW

[Series 1: dg chest 2 view · 0.14mm/px · 2 of 2 slices shown]
[im 1/2]
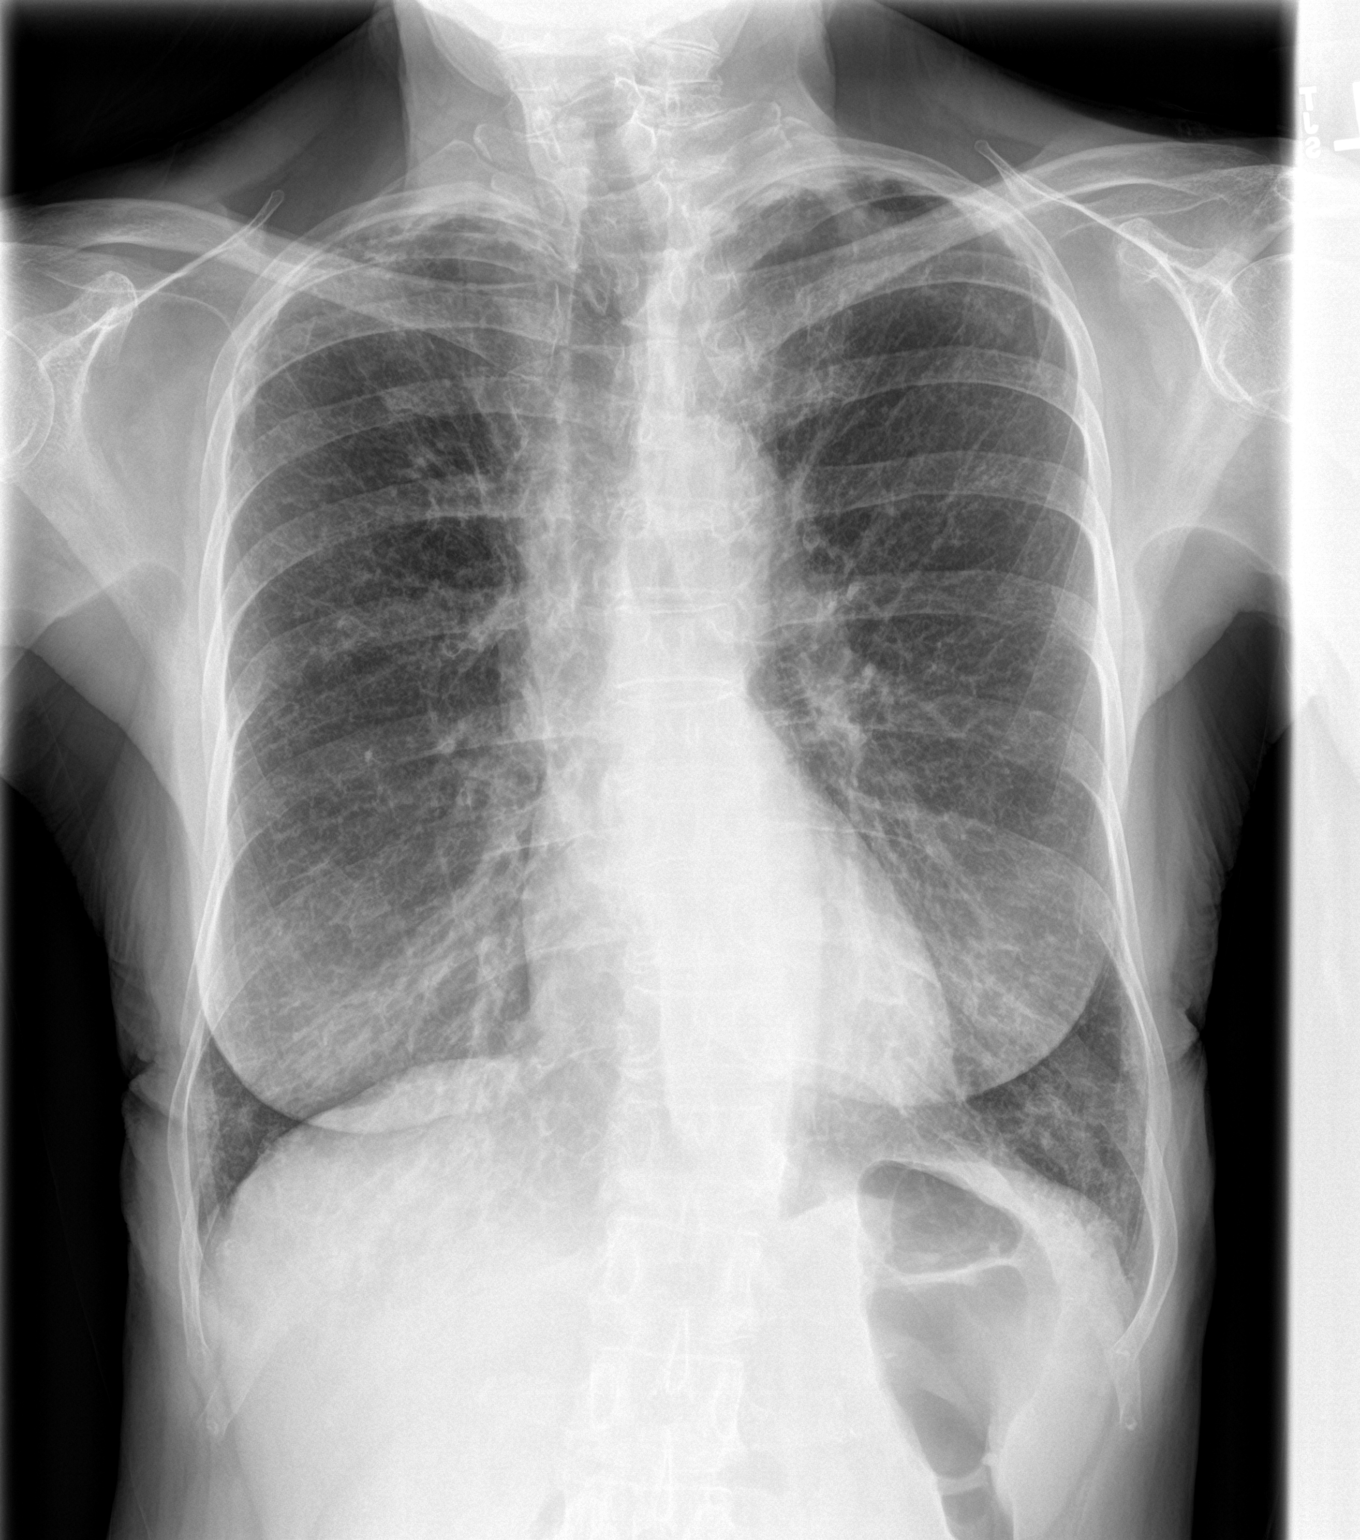
[im 2/2]
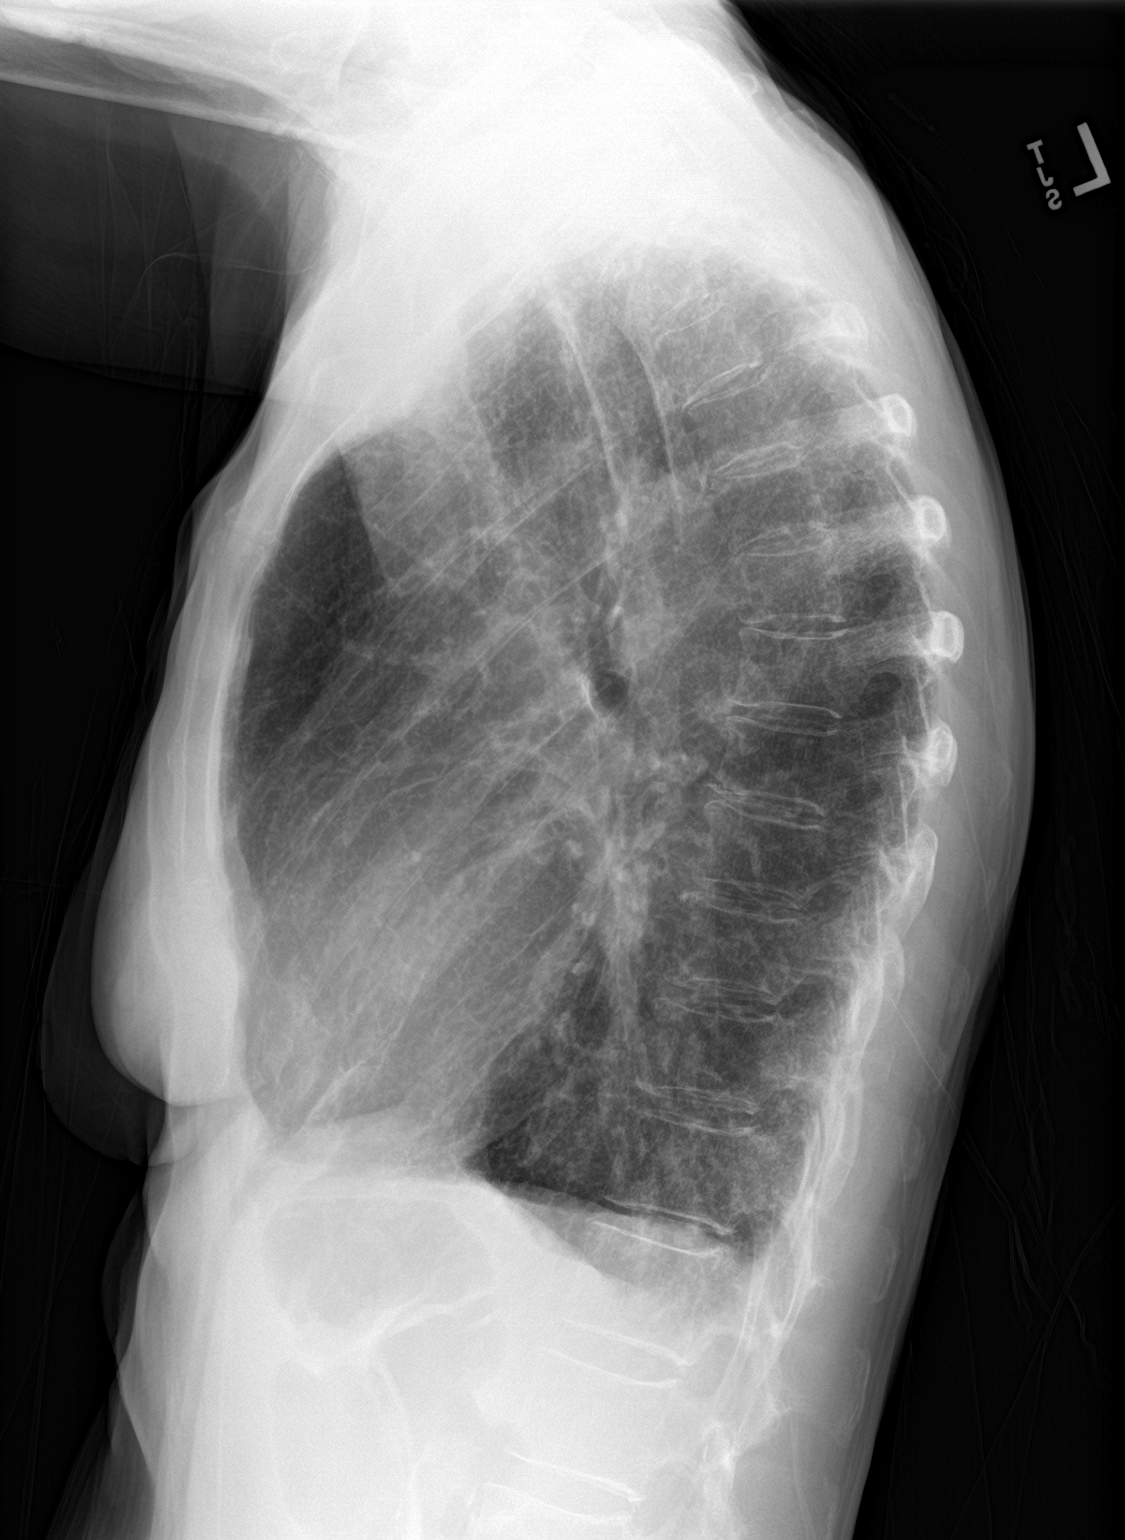

[2 of 2 positions shown; findings below may reference images not displayed]

FINDINGS: The lungs are hyperinflated. Mild, diffuse, chronic appearing
increased interstitial lung markings are seen. Mild biapical pleural
thickening is present. There is no evidence of acute infiltrate,
pleural effusion or pneumothorax. The heart size and mediastinal
contours are within normal limits. The visualized skeletal
structures are unremarkable.
IMPRESSION: 1. Findings consistent with COPD.
2. No active cardiopulmonary disease.

## 2023-04-23 MED ORDER — OXYCODONE HCL 10 MG PO TABS: 10.0000 mg | ORAL_TABLET | Freq: Three times a day (TID) | ORAL | 0 refills | Status: AC

## 2023-04-23 MED ORDER — OXYCODONE HCL 10 MG PO TABS: 10.0000 mg | ORAL_TABLET | Freq: Three times a day (TID) | ORAL | 0 refills | Status: DC

## 2023-04-23 NOTE — Progress Notes (Signed)
 Virtual Visit via Telephone Note  I connected with Nathanel ONEIDA Blackwood on 04/23/23 at  1:20 PM EST by telephone and verified that I am speaking with the correct person using two identifiers.  Location: Patient: Home Provider: Pain control center   I discussed the limitations, risks, security and privacy concerns of performing an evaluation and management service by telephone and the availability of in person appointments. I also discussed with the patient that there may be a patient responsible charge related to this service. The patient expressed understanding and agreed to proceed.   History of Present Illness: I was able to reach out to the [facility] and ended up speaking with her husband [name].  He reports that there is an ongoing battle for custodian ship of Paulina's care as she does have advancing stage Alzheimer dementia.  Her care is complicated by the fact that she has ongoing chronic issues with back pain requiring chronic opioid therapy.  In report from [name] Nathanel who he cares for on a daily basis has continued to have complaints of chronic aching low back pain with radiation into the hips buttocks and down the legs.  This is her baseline pain and has been responsive to opioid therapy.  It is failed more conservative therapy.  In the past her daughter, Izetta has also been involved with her care and was with her last time.  [name], her husband has generally been her caretaker and was available for conversation during today's telephone call.  He has follow-up for guardianship and will be administering her medication for [facility].  He is going to bring paperwork to the pain clinic to verify this.  Greenleigh has been doing well with her existing treatment.  She takes her medications 3 times a day.  She generally gets significant improvement as described to her caretakers.  Without the medication they report that she is frequently moaning and reportedly uncomfortable.  With the medication the pain  is generally better controlled for the next 4 to 6 hours.  No change in her strength bowel or bladder function is noted.  Review of systems: General: No fevers or chills Pulmonary: No shortness of breath or dyspnea Cardiac: No angina or palpitations or lightheadedness GI: No abdominal pain or constipation Psych: Ongoing deterioration in mental status    Observations/Objective:  Current Outpatient Medications:    [START ON 06/03/2023] Oxycodone  HCl 10 MG TABS, Take 1 tablet (10 mg total) by mouth in the morning, at noon, and at bedtime., Disp: 90 tablet, Rfl: 0   albuterol  (PROVENTIL  HFA;VENTOLIN  HFA) 108 (90 Base) MCG/ACT inhaler, Inhale into the lungs every 6 (six) hours as needed for wheezing or shortness of breath., Disp: , Rfl:    aspirin  EC 81 MG tablet, Take 81 mg by mouth daily. Swallow whole., Disp: , Rfl:    Cholecalciferol  25 MCG (1000 UT) tablet, Take by mouth., Disp: , Rfl:    ciprofloxacin (CIPRO) 500 MG tablet, Take 500 mg by mouth 2 (two) times daily. (Patient not taking: Reported on 06/24/2022), Disp: , Rfl:    diclofenac  Sodium (VOLTAREN ) 1 % GEL, Apply 4 g topically 4 (four) times daily. (Patient not taking: Reported on 06/24/2022), Disp: 100 g, Rfl: 3   donepezil  (ARICEPT ) 5 MG tablet, Take by mouth., Disp: , Rfl:    escitalopram (LEXAPRO) 10 MG tablet, Take 10 mg by mouth daily. (Patient not taking: Reported on 06/24/2022), Disp: , Rfl:    escitalopram (LEXAPRO) 10 MG tablet, Take by mouth. (Patient not  taking: Reported on 06/09/2022), Disp: , Rfl:    escitalopram (LEXAPRO) 20 MG tablet, Take by mouth. (Patient not taking: Reported on 06/09/2022), Disp: , Rfl:    FLUoxetine  (PROZAC ) 20 MG capsule, Take 60 mg by mouth every morning. (Patient not taking: Reported on 06/09/2022), Disp: , Rfl:    folic acid  (FOLVITE ) 1 MG tablet, Take by mouth., Disp: , Rfl:    lidocaine  (LIDODERM ) 5 %, Place 1 patch onto the skin daily. (Patient not taking: Reported on 06/24/2022), Disp: , Rfl:     memantine (NAMENDA) 5 MG tablet, Take 5 mg by mouth 2 (two) times daily. (Patient not taking: Reported on 06/24/2022), Disp: , Rfl:    naloxone  (NARCAN ) nasal spray 4 mg/0.1 mL, Place into the nose., Disp: , Rfl:    naloxone  (NARCAN ) nasal spray 4 mg/0.1 mL, As directed for opioid induced respiratory depression, Disp: 1 each, Rfl: 1   nitrofurantoin , macrocrystal-monohydrate, (MACROBID ) 100 MG capsule, Take 100 mg by mouth 2 (two) times daily., Disp: , Rfl:    OLANZapine  (ZYPREXA ) 5 MG tablet, Take 5 mg by mouth daily. (Patient not taking: Reported on 11/04/2021), Disp: , Rfl:    [START ON 05/04/2023] Oxycodone  HCl 10 MG TABS, Take 1 tablet (10 mg total) by mouth 3 (three) times daily., Disp: 90 tablet, Rfl: 0   pantoprazole  (PROTONIX ) 20 MG tablet, Take 1 tablet (20 mg total) by mouth daily., Disp: 30 tablet, Rfl: 0   QUEtiapine (SEROQUEL) 25 MG tablet, Take 25 mg by mouth at bedtime as needed. (Patient not taking: Reported on 06/09/2022), Disp: , Rfl:    solifenacin (VESICARE) 10 MG tablet, Take 10 mg by mouth daily., Disp: , Rfl:    solifenacin (VESICARE) 5 MG tablet, Take by mouth. (Patient not taking: Reported on 06/09/2022), Disp: , Rfl:    solifenacin (VESICARE) 5 MG tablet, Take by mouth. (Patient not taking: Reported on 06/09/2022), Disp: , Rfl:    sulfamethoxazole-trimethoprim (BACTRIM DS) 800-160 MG tablet, Take 1 tablet by mouth 2 (two) times daily. (Patient not taking: Reported on 06/09/2022), Disp: , Rfl:    vitamin B-12 (CYANOCOBALAMIN ) 100 MCG tablet, Take 100 mcg by mouth daily., Disp: , Rfl:    Past Medical History:  Diagnosis Date   Achalasia    Allergy    Arthritis    Depression    Emphysema of lung (HCC)    Fibromyalgia    Gastroesophageal reflux disease with esophagitis 01/26/2020   Formatting of this note might be different from the original. LA Grade C noted on EGD 01/2020   GERD (gastroesophageal reflux disease)    Herpes genitalis    History of chicken pox    History of  kidney stones    Assessment and Plan:  1. Primary osteoarthritis of both knees   2. Primary osteoarthritis involving multiple joints   3. Fibromyalgia   4. Facet syndrome, lumbar   5. Chronic pain syndrome   6. Chronic, continuous use of opioids   7. Mixed Alzheimer's and vascular dementia (HCC)   8. Chronic kidney disease, stage 3a (HCC)    Obviously this is a very complicated scenario especially with the transition and guardianship.  There is an ongoing legal battle between the husband and daughter for her care.  Nonetheless, she does require continued opioid therapy.  Both parties have been open to assisting with medication management for all of These medical problems and they have been good caretakers based on our conversations.  We have requested the [name] bring verification  of guardianship so as to assist with the opioid management.  No other changes in His pharmacologic regimen are initiated today.  She will be due for urine screen here in the next few weeks.  I have encouraged [name] to make sure that she follows up with her primary care physicians for baseline medical care Follow Up Instructions:    I discussed the assessment and treatment plan with the patient. The patient was provided an opportunity to ask questions and all were answered. The patient agreed with the plan and demonstrated an understanding of the instructions.   The patient was advised to call back or seek an in-person evaluation if the symptoms worsen or if the condition fails to improve as anticipated.  I provided 30 minutes of non-face-to-face time during this encounter.   Lynwood KANDICE Clause, MD

## 2023-04-23 NOTE — Patient Instructions (Signed)

## 2023-04-27 ENCOUNTER — Other Ambulatory Visit: Payer: Self-pay | Admitting: Pulmonary Disease

## 2023-04-27 DIAGNOSIS — J449 Chronic obstructive pulmonary disease, unspecified: Secondary | ICD-10-CM

## 2023-04-27 DIAGNOSIS — Z87891 Personal history of nicotine dependence: Secondary | ICD-10-CM

## 2023-04-29 ENCOUNTER — Ambulatory Visit
Admission: RE | Admit: 2023-04-29 | Discharge: 2023-04-29 | Disposition: A | Discharge status: Home / Self Care | Payer: Medicare PPO | Source: Ambulatory Visit | Attending: Pulmonary Disease | Admitting: Pulmonary Disease

## 2023-04-29 DIAGNOSIS — Z87891 Personal history of nicotine dependence: Secondary | ICD-10-CM | POA: Insufficient documentation

## 2023-04-29 DIAGNOSIS — J449 Chronic obstructive pulmonary disease, unspecified: Secondary | ICD-10-CM | POA: Insufficient documentation

## 2023-05-17 ENCOUNTER — Other Ambulatory Visit: Payer: Self-pay

## 2023-05-17 ENCOUNTER — Emergency Department

## 2023-05-17 ENCOUNTER — Inpatient Hospital Stay
Admission: EM | Admit: 2023-05-17 | Discharge: 2023-05-21 | DRG: 689 | Disposition: A | Discharge status: Home Health | Attending: Student | Admitting: Student

## 2023-05-17 DIAGNOSIS — Z87891 Personal history of nicotine dependence: Secondary | ICD-10-CM

## 2023-05-17 DIAGNOSIS — K2270 Barrett's esophagus without dysplasia: Secondary | ICD-10-CM | POA: Diagnosis present

## 2023-05-17 DIAGNOSIS — J439 Emphysema, unspecified: Secondary | ICD-10-CM | POA: Diagnosis present

## 2023-05-17 DIAGNOSIS — Z880 Allergy status to penicillin: Secondary | ICD-10-CM

## 2023-05-17 DIAGNOSIS — K219 Gastro-esophageal reflux disease without esophagitis: Secondary | ICD-10-CM | POA: Diagnosis present

## 2023-05-17 DIAGNOSIS — N179 Acute kidney failure, unspecified: Secondary | ICD-10-CM | POA: Diagnosis present

## 2023-05-17 DIAGNOSIS — F0150 Vascular dementia without behavioral disturbance: Secondary | ICD-10-CM | POA: Diagnosis present

## 2023-05-17 DIAGNOSIS — Z681 Body mass index (BMI) 19 or less, adult: Secondary | ICD-10-CM

## 2023-05-17 DIAGNOSIS — Z79899 Other long term (current) drug therapy: Secondary | ICD-10-CM

## 2023-05-17 DIAGNOSIS — E876 Hypokalemia: Secondary | ICD-10-CM | POA: Diagnosis present

## 2023-05-17 DIAGNOSIS — Z7951 Long term (current) use of inhaled steroids: Secondary | ICD-10-CM

## 2023-05-17 DIAGNOSIS — N390 Urinary tract infection, site not specified: Principal | ICD-10-CM | POA: Diagnosis present

## 2023-05-17 DIAGNOSIS — G9341 Metabolic encephalopathy: Secondary | ICD-10-CM | POA: Diagnosis present

## 2023-05-17 DIAGNOSIS — Z8261 Family history of arthritis: Secondary | ICD-10-CM

## 2023-05-17 DIAGNOSIS — M797 Fibromyalgia: Secondary | ICD-10-CM | POA: Diagnosis present

## 2023-05-17 DIAGNOSIS — Z823 Family history of stroke: Secondary | ICD-10-CM

## 2023-05-17 DIAGNOSIS — R627 Adult failure to thrive: Secondary | ICD-10-CM | POA: Diagnosis present

## 2023-05-17 DIAGNOSIS — B952 Enterococcus as the cause of diseases classified elsewhere: Secondary | ICD-10-CM | POA: Diagnosis present

## 2023-05-17 DIAGNOSIS — E860 Dehydration: Secondary | ICD-10-CM | POA: Diagnosis present

## 2023-05-17 DIAGNOSIS — G309 Alzheimer's disease, unspecified: Secondary | ICD-10-CM | POA: Diagnosis not present

## 2023-05-17 DIAGNOSIS — R54 Age-related physical debility: Secondary | ICD-10-CM | POA: Diagnosis present

## 2023-05-17 DIAGNOSIS — E871 Hypo-osmolality and hyponatremia: Secondary | ICD-10-CM | POA: Diagnosis present

## 2023-05-17 DIAGNOSIS — Z1152 Encounter for screening for COVID-19: Secondary | ICD-10-CM

## 2023-05-17 DIAGNOSIS — Z8249 Family history of ischemic heart disease and other diseases of the circulatory system: Secondary | ICD-10-CM

## 2023-05-17 DIAGNOSIS — Z888 Allergy status to other drugs, medicaments and biological substances status: Secondary | ICD-10-CM

## 2023-05-17 DIAGNOSIS — Z515 Encounter for palliative care: Secondary | ICD-10-CM

## 2023-05-17 DIAGNOSIS — F0280 Dementia in other diseases classified elsewhere without behavioral disturbance: Secondary | ICD-10-CM | POA: Diagnosis present

## 2023-05-17 DIAGNOSIS — E870 Hyperosmolality and hypernatremia: Principal | ICD-10-CM | POA: Diagnosis present

## 2023-05-17 DIAGNOSIS — Z7982 Long term (current) use of aspirin: Secondary | ICD-10-CM

## 2023-05-17 DIAGNOSIS — E43 Unspecified severe protein-calorie malnutrition: Secondary | ICD-10-CM | POA: Diagnosis present

## 2023-05-17 LAB — CBC
HCT: 38.3 % (ref 36.0–46.0)
Hemoglobin: 12.2 g/dL (ref 12.0–15.0)
MCH: 29.8 pg (ref 26.0–34.0)
MCHC: 31.9 g/dL (ref 30.0–36.0)
MCV: 93.4 fL (ref 80.0–100.0)
Platelets: 221 10*3/uL (ref 150–400)
RBC: 4.1 MIL/uL (ref 3.87–5.11)
RDW: 14.6 % (ref 11.5–15.5)
WBC: 18 10*3/uL — ABNORMAL HIGH (ref 4.0–10.5)
nRBC: 0 % (ref 0.0–0.2)

## 2023-05-17 LAB — BASIC METABOLIC PANEL
Anion gap: 14 (ref 5–15)
BUN: 48 mg/dL — ABNORMAL HIGH (ref 8–23)
CO2: 25 mmol/L (ref 22–32)
Calcium: 10.1 mg/dL (ref 8.9–10.3)
Chloride: 114 mmol/L — ABNORMAL HIGH (ref 98–111)
Creatinine, Ser: 1.22 mg/dL — ABNORMAL HIGH (ref 0.44–1.00)
GFR, Estimated: 47 mL/min — ABNORMAL LOW (ref >60)
Glucose, Bld: 132 mg/dL — ABNORMAL HIGH (ref 70–99)
Potassium: 3.3 mmol/L — ABNORMAL LOW (ref 3.5–5.1)
Sodium: 153 mmol/L — ABNORMAL HIGH (ref 135–145)

## 2023-05-17 LAB — RESP PANEL BY RT-PCR (RSV, FLU A&B, COVID)  RVPGX2
Influenza A by PCR: NEGATIVE
Influenza B by PCR: NEGATIVE
Resp Syncytial Virus by PCR: NEGATIVE
SARS Coronavirus 2 by RT PCR: NEGATIVE

## 2023-05-17 LAB — LACTIC ACID, PLASMA: Lactic Acid, Venous: 1.2 mmol/L (ref 0.5–1.9)

## 2023-05-17 LAB — TROPONIN I (HIGH SENSITIVITY): Troponin I (High Sensitivity): 16 ng/L (ref <18)

## 2023-05-17 MED ORDER — SODIUM CHLORIDE 0.9 % IV BOLUS
500.0000 mL | Freq: Once | INTRAVENOUS | Status: AC
  Administered 2023-05-17: 500 mL via INTRAVENOUS

## 2023-05-17 NOTE — ED Triage Notes (Addendum)
 Pt to ed from home via POV with spouse for increased weakness, and diarrhea. Pt has HX of dementia and is baseline. Pt had a very hard time walking in. Pt placed in wheelchair and moved to triage 2. Spouse is her FT caregiver and advised she started feeling worse and acting different the last 2 days. Pt smells of urinary tract infection. Pt has HX of same in the past.

## 2023-05-17 NOTE — ED Provider Notes (Signed)
 [facility] Provider Note    Event Date/Time   First MD Initiated Contact with Patient 05/17/23 2257     (approximate)   History   Weakness  Level 5 caveat:  history/ROS limited by chronic dementia  HPI Katrina Simon is a 73 y.o. female who reportedly has a history of presumed Alzheimer's dementia and is living with her husband, who is at bedside and states that he is her legal guardian and Management consultant.  He brought her for evaluation tonight because of 2 to 3 days of gradually worsening generalized weakness, decreased amount of communication, inability to ambulate normally, and refusal to eat or drink very much.  He said that he has been encouraging her to eat and drink but she is essentially refusing.  She is not indicating any pain, although she is minimally verbal at baseline.  She is following his instructions to some extent but he said that she falls asleep very quickly and easily.  She does not seem to be in distress but she is just moving slowly and having difficulty getting around.  Normally she is ambulatory and moving around the house and doing things but she has not done so for the last several days.  They recently went to a GI doctor in [place] who mentioned that she may need a G-tube at some point.  The patient has also seen Dr. Sherryll Burger with neurology about the Alzheimer's diagnosis.  The patient's husband reports that he just recently got "custody" of her again after an adult child had taken over as the patient's decision maker, so the husband said he is reestablishing care with all of the local doctors in this area including primary care.     Physical Exam   Triage Vital Signs: ED Triage Vitals  Encounter Vitals Group     BP 05/17/23 2138 133/76     Systolic BP Percentile --      Diastolic BP Percentile --      Pulse Rate 05/17/23 2138 100     Resp 05/17/23 2138 16     Temp 05/17/23 2138 99 F (37.2 C)     Temp src --      SpO2 05/17/23  2138 98 %     Weight 05/17/23 2141 53 kg (116 lb 13.5 oz)     Height 05/17/23 2141 1.702 m (5\' 7" )     Head Circumference --      Peak Flow --      Pain Score --      Pain Loc --      Pain Education --      Exclude from Growth Chart --     Most recent vital signs: Vitals:   05/17/23 2230 05/17/23 2330  BP: 107/68 119/68  Pulse: 86 91  Resp: 19 14  Temp:    SpO2: 91% 92%    General: Awake and alert, will smile and weak at me, but is not communicating.  Appears older than chronological age and appears tired, but is not in acute or severe distress. CV:  Good peripheral perfusion.  Regular rate and rhythm. Resp:  Normal effort. no accessory muscle usage nor intercostal retractions.  Lungs are clear to auscultation bilaterally. Abd:  No distention.  Cachectic.  No tenderness to palpation and no guarding. Other:  Patient cannot or will not follow commands and cannot engage in a neurological exam with me, but I did observe her moving her extremities and the nurses who performed the  in and out catheterization did not observe any profound focal neurological deficit   ED Results / Procedures / Treatments   Labs (all labs ordered are listed, but only abnormal results are displayed) Labs Reviewed  BASIC METABOLIC PANEL - Abnormal; Notable for the following components:      Result Value   Sodium 153 (*)    Potassium 3.3 (*)    Chloride 114 (*)    Glucose, Bld 132 (*)    BUN 48 (*)    Creatinine, Ser 1.22 (*)    GFR, Estimated 47 (*)    All other components within normal limits  CBC - Abnormal; Notable for the following components:   WBC 18.0 (*)    All other components within normal limits  URINALYSIS, ROUTINE W REFLEX MICROSCOPIC - Abnormal; Notable for the following components:   Color, Urine YELLOW (*)    APPearance HAZY (*)    Hgb urine dipstick MODERATE (*)    Protein, ur 30 (*)    Leukocytes,Ua SMALL (*)    Bacteria, UA RARE (*)    All other components within normal  limits  RESP PANEL BY RT-PCR (RSV, FLU A&B, COVID)  [id number]  URINE CULTURE  LACTIC ACID, PLASMA  TROPONIN I (HIGH SENSITIVITY)     EKG  ED ECG REPORT I, Loleta Rose, the attending physician, personally viewed and interpreted this ECG.  Date: 05/17/2023 EKG Time: 21: 43 Rate: 105 Rhythm: Sinus tachycardia QRS Axis: Right axis deviation Intervals: normal ST/T Wave abnormalities: Non-specific ST segment / T-wave changes, but no clear evidence of acute ischemia. Narrative Interpretation: no definitive evidence of acute ischemia; does not meet STEMI criteria.    RADIOLOGY I viewed and interpreted the patient's head CT and there is no evidence of acute bleed or obvious CVA.  I also read the radiologist's report, which confirmed no acute findings.  I also viewed and interpreted the patient's chest x-ray I see no evidence of a lobar pneumonia.  Radiology report indicates some chronic scarring but no evidence of an acute or emergent abnormality.   PROCEDURES:  Critical Care performed: No  Procedures    IMPRESSION / MDM / ASSESSMENT AND PLAN / ED COURSE  I reviewed the triage vital signs and the nursing notes.                              Differential diagnosis includes, but is not limited to, generalized failure to thrive, dehydration, kidney dysfunction, acute infectious process such as UTI or pneumonia, CVA, intracranial bleed.  Patient's presentation is most consistent with acute presentation with potential threat to life or bodily function.  Labs/studies ordered: EKG, chest x-ray, CT head, urinalysis, respiratory viral panel, BMP, high-sensitivity troponin, lactic acid, CBC  Interventions/Medications given:  Medications  cefTRIAXone (ROCEPHIN) 1 g in sodium chloride 0.9 % 100 mL IVPB (has no administration in time range)  dextrose 5 % and 0.45% NaCl 5-0.45 % bolus 1,000 mL (has no administration in time range)  potassium chloride 10 mEq in 50 mL *CENTRAL LINE* IVPB  (has no administration in time range)  sodium chloride 0.9 % bolus 500 mL (500 mLs Intravenous New Bag/Given 05/17/23 2330)    (Note:  hospital course my include additional interventions and/or labs/studies not listed above.)   Patient's vitals are essentially normal, borderline hypoxia and borderline tachycardia, but the patient is in no respiratory distress and does not have any obvious symptoms other than as described  in the HPI.    Lab work is notable for significant hypernatremia and what appears to be an AKI.  She certainly looks volume depleted/dehydrated and I ordered a 500 mL normal saline bolus to begin the rehydration.  She has a leukocytosis of 18 and urinalysis is currently pending but she is afebrile and has no lactic acidosis; the leukocytosis may be at least in part due to hemoconcentration and I will not treat empirically given the lack of infectious signs or symptoms at this time.  I had a conversation with the patient's husband about CODE STATUS and as of right now she is full code, but we had an open and honest conversation about goals of care, what the patient would want if she were able to speak for herself, etc., and he is going to consider the issue and understands that he will be asking again about CODE STATUS.  The patient meets criteria for hospitalization and I will consult the hospitalist team after getting back the results of the urinalysis and treating appropriately.     Clinical Course as of 05/18/23 0029  Wynelle Link May 17, 2023  2353 Resp panel by RT-PCR (RSV, Flu A&B, Covid) Anterior Nasal Swab Negative viral panel [CF]  Mon May 18, 2023  0019 Urinalysis, Routine w reflex microscopic -Urine, Clean Catch(!) Questionable UA, moderate hemoglobinuria with small leukocytes.  Given her leukocytosis of 18, I ordered a urine culture and will treat empirically with a dose of ceftriaxone and defer additional care to the hospital service.  Consulting the hospitalist team for  admission. [CF]  0028 Consulted by phone with Dr. Margette Fast, who will admit the patient [CF]    Clinical Course User Index [CF] Loleta Rose, MD     FINAL CLINICAL IMPRESSION(S) / ED DIAGNOSES   Final diagnoses:  Hypernatremia  Dehydration  Acute kidney injury (HCC)  Alzheimer's dementia, unspecified dementia severity, unspecified timing of dementia onset, unspecified whether behavioral, psychotic, or mood disturbance or anxiety (HCC)  Failure to thrive in adult     Rx / DC Orders   ED Discharge Orders     None        Note:  This document was prepared using Dragon voice recognition software and may include unintentional dictation errors.   Loleta Rose, MD 05/18/23 [phone]

## 2023-05-18 ENCOUNTER — Encounter: Payer: Self-pay | Admitting: Internal Medicine

## 2023-05-18 DIAGNOSIS — F0280 Dementia in other diseases classified elsewhere without behavioral disturbance: Secondary | ICD-10-CM

## 2023-05-18 DIAGNOSIS — J439 Emphysema, unspecified: Secondary | ICD-10-CM | POA: Diagnosis present

## 2023-05-18 DIAGNOSIS — Z8249 Family history of ischemic heart disease and other diseases of the circulatory system: Secondary | ICD-10-CM | POA: Diagnosis not present

## 2023-05-18 DIAGNOSIS — G9341 Metabolic encephalopathy: Secondary | ICD-10-CM | POA: Diagnosis present

## 2023-05-18 DIAGNOSIS — G309 Alzheimer's disease, unspecified: Secondary | ICD-10-CM

## 2023-05-18 DIAGNOSIS — Z515 Encounter for palliative care: Secondary | ICD-10-CM | POA: Diagnosis not present

## 2023-05-18 DIAGNOSIS — N179 Acute kidney failure, unspecified: Secondary | ICD-10-CM

## 2023-05-18 DIAGNOSIS — F0150 Vascular dementia without behavioral disturbance: Secondary | ICD-10-CM | POA: Diagnosis present

## 2023-05-18 DIAGNOSIS — N390 Urinary tract infection, site not specified: Secondary | ICD-10-CM | POA: Diagnosis present

## 2023-05-18 DIAGNOSIS — E876 Hypokalemia: Secondary | ICD-10-CM | POA: Diagnosis present

## 2023-05-18 DIAGNOSIS — Z681 Body mass index (BMI) 19 or less, adult: Secondary | ICD-10-CM | POA: Diagnosis not present

## 2023-05-18 DIAGNOSIS — Z87891 Personal history of nicotine dependence: Secondary | ICD-10-CM | POA: Diagnosis not present

## 2023-05-18 DIAGNOSIS — E43 Unspecified severe protein-calorie malnutrition: Secondary | ICD-10-CM | POA: Diagnosis present

## 2023-05-18 DIAGNOSIS — K219 Gastro-esophageal reflux disease without esophagitis: Secondary | ICD-10-CM | POA: Diagnosis present

## 2023-05-18 DIAGNOSIS — E860 Dehydration: Secondary | ICD-10-CM | POA: Diagnosis present

## 2023-05-18 DIAGNOSIS — E871 Hypo-osmolality and hyponatremia: Secondary | ICD-10-CM | POA: Diagnosis present

## 2023-05-18 DIAGNOSIS — R627 Adult failure to thrive: Secondary | ICD-10-CM | POA: Diagnosis present

## 2023-05-18 DIAGNOSIS — Z1152 Encounter for screening for COVID-19: Secondary | ICD-10-CM | POA: Diagnosis not present

## 2023-05-18 DIAGNOSIS — Z7982 Long term (current) use of aspirin: Secondary | ICD-10-CM | POA: Diagnosis not present

## 2023-05-18 DIAGNOSIS — K2270 Barrett's esophagus without dysplasia: Secondary | ICD-10-CM | POA: Diagnosis present

## 2023-05-18 DIAGNOSIS — B952 Enterococcus as the cause of diseases classified elsewhere: Secondary | ICD-10-CM | POA: Diagnosis present

## 2023-05-18 DIAGNOSIS — E870 Hyperosmolality and hypernatremia: Secondary | ICD-10-CM | POA: Diagnosis present

## 2023-05-18 DIAGNOSIS — M797 Fibromyalgia: Secondary | ICD-10-CM | POA: Diagnosis present

## 2023-05-18 LAB — SODIUM: Sodium: 147 mmol/L — ABNORMAL HIGH (ref 135–145)

## 2023-05-18 LAB — BASIC METABOLIC PANEL
Anion gap: 10 (ref 5–15)
BUN: 41 mg/dL — ABNORMAL HIGH (ref 8–23)
CO2: 22 mmol/L (ref 22–32)
Calcium: 8.6 mg/dL — ABNORMAL LOW (ref 8.9–10.3)
Chloride: 116 mmol/L — ABNORMAL HIGH (ref 98–111)
Creatinine, Ser: 0.89 mg/dL (ref 0.44–1.00)
GFR, Estimated: >60 mL/min (ref >60)
Glucose, Bld: 138 mg/dL — ABNORMAL HIGH (ref 70–99)
Potassium: 3.3 mmol/L — ABNORMAL LOW (ref 3.5–5.1)
Sodium: 148 mmol/L — ABNORMAL HIGH (ref 135–145)

## 2023-05-18 LAB — URINALYSIS, ROUTINE W REFLEX MICROSCOPIC
Bilirubin Urine: NEGATIVE
Glucose, UA: NEGATIVE mg/dL
Ketones, ur: NEGATIVE mg/dL
Nitrite: NEGATIVE
Protein, ur: 30 mg/dL — AB
Specific Gravity, Urine: 1.023 (ref 1.005–1.030)
pH: 5 (ref 5.0–8.0)

## 2023-05-18 LAB — CBC
HCT: 29.6 % — ABNORMAL LOW (ref 36.0–46.0)
Hemoglobin: 9.4 g/dL — ABNORMAL LOW (ref 12.0–15.0)
MCH: 29.7 pg (ref 26.0–34.0)
MCHC: 31.8 g/dL (ref 30.0–36.0)
MCV: 93.7 fL (ref 80.0–100.0)
Platelets: 172 10*3/uL (ref 150–400)
RBC: 3.16 MIL/uL — ABNORMAL LOW (ref 3.87–5.11)
RDW: 14.6 % (ref 11.5–15.5)
WBC: 12.6 10*3/uL — ABNORMAL HIGH (ref 4.0–10.5)
nRBC: 0 % (ref 0.0–0.2)

## 2023-05-18 LAB — PHOSPHORUS: Phosphorus: 2.3 mg/dL — ABNORMAL LOW (ref 2.5–4.6)

## 2023-05-18 LAB — OSMOLALITY: Osmolality: 326 mosm/kg (ref 275–295)

## 2023-05-18 LAB — MAGNESIUM: Magnesium: 1.9 mg/dL (ref 1.7–2.4)

## 2023-05-18 MED ORDER — DEXTROSE-SODIUM CHLORIDE 5-0.45 % IV SOLN
INTRAVENOUS | Status: DC
Start: 2023-05-18 — End: 2023-05-19

## 2023-05-18 MED ORDER — DEXTROSE 5 % AND 0.45 % NACL IV BOLUS: 1000.0000 mL | Freq: Once | INTRAVENOUS | Status: DC

## 2023-05-18 MED ORDER — MEGESTROL ACETATE 400 MG/10ML PO SUSP
400.0000 mg | Freq: Every day | ORAL | Status: DC
  Administered 2023-05-18 – 2023-05-21 (×4): 400 mg via ORAL
  Filled 2023-05-18 (×4): qty 10

## 2023-05-18 MED ORDER — SODIUM CHLORIDE 0.9% FLUSH
3.0000 mL | Freq: Two times a day (BID) | INTRAVENOUS | Status: DC
  Administered 2023-05-18 (×3): 3 mL via INTRAVENOUS

## 2023-05-18 MED ORDER — ENSURE ENLIVE PO LIQD
237.0000 mL | Freq: Two times a day (BID) | ORAL | Status: DC
  Administered 2023-05-20 – 2023-05-21 (×3): 237 mL via ORAL

## 2023-05-18 MED ORDER — POTASSIUM CHLORIDE 10 MEQ/100ML IV SOLN
10.0000 meq | INTRAVENOUS | Status: AC
  Administered 2023-05-18 (×3): 10 meq via INTRAVENOUS
  Filled 2023-05-18 (×2): qty 100

## 2023-05-18 MED ORDER — FOLIC ACID 1 MG PO TABS
1.0000 mg | ORAL_TABLET | Freq: Every day | ORAL | Status: DC
  Administered 2023-05-18 – 2023-05-21 (×4): 1 mg via ORAL
  Filled 2023-05-18 (×4): qty 1

## 2023-05-18 MED ORDER — SODIUM CHLORIDE 0.9% FLUSH: 3.0000 mL | INTRAVENOUS | Status: DC | PRN

## 2023-05-18 MED ORDER — SODIUM CHLORIDE 0.9 % IV SOLN: 250.0000 mL | INTRAVENOUS | Status: DC | PRN

## 2023-05-18 MED ORDER — ALBUTEROL SULFATE (2.5 MG/3ML) 0.083% IN NEBU: 2.5000 mg | INHALATION_SOLUTION | RESPIRATORY_TRACT | Status: DC | PRN

## 2023-05-18 MED ORDER — ACETAMINOPHEN 650 MG RE SUPP: 650.0000 mg | Freq: Four times a day (QID) | RECTAL | Status: DC | PRN

## 2023-05-18 MED ORDER — SODIUM CHLORIDE 0.9 % IV SOLN
1.0000 g | INTRAVENOUS | Status: DC
  Administered 2023-05-19: 1 g via INTRAVENOUS
  Filled 2023-05-18 (×2): qty 10

## 2023-05-18 MED ORDER — ACETAMINOPHEN 325 MG PO TABS
650.0000 mg | ORAL_TABLET | Freq: Four times a day (QID) | ORAL | Status: DC | PRN
  Administered 2023-05-18: 650 mg via ORAL
  Filled 2023-05-18: qty 2

## 2023-05-18 MED ORDER — HEPARIN SODIUM (PORCINE) 5000 UNIT/ML IJ SOLN
5000.0000 [IU] | Freq: Three times a day (TID) | INTRAMUSCULAR | Status: DC
  Administered 2023-05-18 – 2023-05-19 (×5): 5000 [IU] via SUBCUTANEOUS
  Filled 2023-05-18 (×4): qty 1

## 2023-05-18 MED ORDER — ASPIRIN 81 MG PO TBEC
81.0000 mg | DELAYED_RELEASE_TABLET | Freq: Every day | ORAL | Status: DC
  Administered 2023-05-18 – 2023-05-21 (×4): 81 mg via ORAL
  Filled 2023-05-18 (×4): qty 1

## 2023-05-18 MED ORDER — SENNOSIDES-DOCUSATE SODIUM 8.6-50 MG PO TABS: 1.0000 | ORAL_TABLET | Freq: Every evening | ORAL | Status: DC | PRN

## 2023-05-18 MED ORDER — POTASSIUM PHOSPHATES 15 MMOLE/5ML IV SOLN
30.0000 mmol | Freq: Once | INTRAVENOUS | Status: AC
  Administered 2023-05-18: 30 mmol via INTRAVENOUS
  Filled 2023-05-18: qty 10

## 2023-05-18 MED ORDER — VITAMIN B-12 100 MCG PO TABS
100.0000 ug | ORAL_TABLET | Freq: Every day | ORAL | Status: DC
  Administered 2023-05-18 – 2023-05-19 (×2): 100 ug via ORAL
  Filled 2023-05-18 (×2): qty 1

## 2023-05-18 MED ORDER — SODIUM CHLORIDE 0.9 % IV SOLN
1.0000 g | INTRAVENOUS | Status: AC
  Administered 2023-05-18: 1 g via INTRAVENOUS
  Filled 2023-05-18: qty 10

## 2023-05-18 MED ORDER — DONEPEZIL HCL 5 MG PO TABS
5.0000 mg | ORAL_TABLET | Freq: Every day | ORAL | Status: DC
  Administered 2023-05-18 – 2023-05-20 (×4): 5 mg via ORAL
  Filled 2023-05-18 (×4): qty 1

## 2023-05-18 MED ORDER — FESOTERODINE FUMARATE ER 4 MG PO TB24
4.0000 mg | ORAL_TABLET | Freq: Every day | ORAL | Status: DC
  Administered 2023-05-18: 4 mg via ORAL
  Filled 2023-05-18 (×2): qty 1

## 2023-05-18 NOTE — ED Notes (Signed)
 Husband at bedside assisting patient with breakfast.

## 2023-05-18 NOTE — TOC Initial Note (Addendum)
 Transition of Care [facility]) - Initial/Assessment Note    Patient Details  Name: Katrina Simon MRN: [id number] Date of Birth: 1950-05-19  [facility] CM/SW Contact:    Margarito Liner, LCSW Phone Number: 05/18/2023, 12:28 PM  Clinical Narrative:   Readmission prevention screen complete. Patient not fully oriented. Husband at bedside. CSW introduced role and explained that discharge planning would be discussed. PCP is Aris Georgia, PA-C at [facility] [address]. No issues obtaining medications. Patient lives home with her husband, son, and granddaughter. Husband stated that he became her legal guardian on 2/3. Patient was set up with [facility] but husband had to get the agency in [place] cancelled before the one in [place] can start services. No DME use prior to admission. Husband stated she refuses to use rollator or cane. Patient does not use oxygen at home but she was using it during assessment. No further concerns. CSW will continue to follow patient and her husband for support and facilitate return home once stable. Husband will transport her home at discharge.          2:03 pm: [facility] will need a home health order for PT only at discharge.       Expected Discharge Plan: Home w Home Health Services Barriers to Discharge: Continued Medical Work up   Patient Goals and CMS Choice            Expected Discharge Plan and Services     Post Acute Care Choice: Home Health Living arrangements for the past 2 months: Single Family Home                                      Prior Living Arrangements/Services Living arrangements for the past 2 months: Single Family Home Lives with:: Spouse, Adult Children, Relatives Patient language and need for interpreter reviewed:: Yes Do you feel safe going back to the place where you live?: Yes      Need for Family Participation in Patient Care: Yes  (Comment) Care giver support system in place?: Yes (comment)   Criminal Activity/Legal Involvement Pertinent to Current Situation/Hospitalization: No - Comment as needed  Activities of Daily Living      Permission Sought/Granted Permission sought to share information with : Family Supports, Oceanographer granted to share information with : Yes, Verbal Permission Granted  Share Information with NAME: Elsy Chiang  Permission granted to share info w AGENCY: [facility]  Permission granted to share info w Relationship: Husband  Permission granted to share info w Contact Information: [phone]  Emotional Assessment Appearance:: Appears stated age Attitude/Demeanor/Rapport: Unable to Assess Affect (typically observed): Calm, Pleasant Orientation: : Oriented to Self, Oriented to Place, Oriented to  Time, Oriented to Situation Alcohol / Substance Use: Not Applicable Psych Involvement: No (comment)  Admission diagnosis:  Failure to thrive in adult [R62.7] Patient Active Problem List   Diagnosis Date Noted   Failure to thrive in adult 05/18/2023   Mixed Alzheimer's and vascular dementia (HCC) 11/18/2021   Urge incontinence 11/18/2021   Tardive dyskinesia 10/16/2021   Dog bite of right lower leg 06/26/2021   Hallux valgus of left foot 06/26/2021   Traumatic leg ulcer, right, with fat layer exposed (HCC) 06/26/2021   Skin ulcer (HCC) 06/24/2021   Malnutrition of moderate degree 06/22/2021   AKI (acute kidney injury) (HCC)  06/21/2021   Sepsis (HCC) 06/21/2021   Acute respiratory failure with hypoxia and hypercapnia (HCC) 06/20/2021   Chronic kidney disease, stage 3a (HCC) 06/20/2021   Depression with anxiety 06/20/2021   COPD exacerbation (HCC) 06/20/2021   CAP (community acquired pneumonia) 06/20/2021   Dog bite_right leg 06/20/2021   Chronic, continuous use of opioids 06/18/2021   Cognitive deficits 03/26/2021   Hardening of the aorta  (main artery of the heart) (HCC) 03/26/2021   Major depressive disorder, recurrent, moderate (HCC) 03/26/2021   Interstitial lung disease (HCC) 02/26/2021   Thyroid nodule 02/26/2021   Loss of weight    Falls 07/26/2020   Protein-calorie malnutrition, severe 07/26/2020   Emphysema of lung (HCC)    Depression    Stage 3 chronic kidney disease (HCC) 10/22/2019   Herpes genitalis 10/02/2016   Chronic pain syndrome 12/06/2014   Personal history of urinary calculi 12/06/2014   Esophageal reflux 12/06/2014   Facet syndrome, lumbar 09/21/2014   DJD of shoulder 08/23/2014   DDD (degenerative disc disease), lumbar 07/18/2014   DDD (degenerative disc disease), thoracolumbar 07/18/2014   DJD (degenerative joint disease) of knee 07/18/2014   DJD (degenerative joint disease) shoulder, sacroiliac joint  07/18/2014   CAFL (chronic airflow limitation) (HCC) 08/24/2013   Clinical depression 08/24/2013   Fibromyalgia 08/24/2013   Arthritis, degenerative 08/18/2013   Adenomatous colon polyp 05/10/2013   Anxiety disorder 12/29/2006   Benign paroxysmal positional nystagmus 12/01/2005   Generalized osteoarthrosis, involving multiple sites 03/17/1997   PCP:  System, Provider Not In Pharmacy:   [id number],  - [address] [place] [address] Phone: [phone] Fax: [phone]     Social Drivers of Health (SDOH) Social History: SDOH Screenings   Food Insecurity: No Food Insecurity (11/13/2022)   Received from [facility]  Housing: Unknown (04/23/2023)   Received from [facility]  Transportation Needs: No Transportation Needs (11/13/2022)   Received from [facility]  Utilities: Not At Risk (11/13/2022)   Received from [facility]  Depression (PHQ2-9): Low Risk  (01/05/2023)  Financial Resource Strain: Low Risk  (11/13/2022)   Received from [facility]  Physical Activity: Unknown (11/13/2022)   Received from [facility]   Social Connections: Moderately Integrated (11/13/2022)   Received from [facility]  Stress: Stress Concern Present (11/13/2022)   Received from [facility]  Tobacco Use: Medium Risk (05/17/2023)   SDOH Interventions:     Readmission Risk Interventions    05/18/2023   12:24 PM  Readmission Risk Prevention Plan  Transportation Screening Complete  PCP or Specialist Appt within 3-5 Days Complete  HRI or Home Care Consult Complete  Social Work Consult for Recovery Care Planning/Counseling Complete  Palliative Care Screening Complete  Medication Review Oceanographer) Complete

## 2023-05-18 NOTE — H&P (Signed)
 History and Physical    Patient: Katrina Simon NWG:[id number] DOB: 1951/02/06 DOA: 05/17/2023 DOS: the patient was seen and examined on 05/18/2023 PCP: System, Provider Not In  Patient coming from: Home  Chief Complaint:  Chief Complaint  Patient presents with   Weakness   HPI:  Patient is unable to give any appropriate history due to patient's condition.  Katrina Simon is a 73 y.o. female with medical history significant of progressive worsening dementia over the past 1 and half years, COPD/emphysema, history of GERD/short segment of Barrett's esophagus.  Patient lives with her husband.  She is ADL and IADL dependent.  She was brought in by her husband tonight on account of generalized weakness and increased drowsiness.  Per husband who is the primary caretaker, patient has not been eating or drinking well over the past 3 days.  Symptoms were preceded by 3 days of diarrhea.  No reported fever or chills.  No nausea or vomiting.  She was also noted to have progressive weight loss over the past several months after custody battle with daughter.  Review of Systems: unable to review all systems due to the inability of the patient to answer questions. Past Medical History:  Diagnosis Date   Achalasia    Allergy    Arthritis    Depression    Emphysema of lung (HCC)    Fibromyalgia    Gastroesophageal reflux disease with esophagitis 01/26/2020   Formatting of this note might be different from the original. LA Grade C noted on EGD 01/2020   GERD (gastroesophageal reflux disease)    Herpes genitalis    History of chicken pox    History of kidney stones    Past Surgical History:  Procedure Laterality Date   CESAREAN SECTION     COLONOSCOPY WITH PROPOFOL N/A 10/03/2020   Procedure: COLONOSCOPY WITH PROPOFOL;  Surgeon: Toney Reil, MD;  Location: [facility] ENDOSCOPY;  Service: Gastroenterology;  Laterality: N/A;   COLONOSCOPY WITH PROPOFOL N/A 10/04/2020   Procedure: COLONOSCOPY WITH  PROPOFOL;  Surgeon: Toney Reil, MD;  Location: [facility];  Service: Endoscopy;  Laterality: N/A;   ESOPHAGOGASTRODUODENOSCOPY (EGD) WITH PROPOFOL N/A 11/19/2016   Procedure: ESOPHAGOGASTRODUODENOSCOPY (EGD) WITH PROPOFOL;  Surgeon: Scot Jun, MD;  Location: [facility] ENDOSCOPY;  Service: Endoscopy;  Laterality: N/A;   ESOPHAGOGASTRODUODENOSCOPY (EGD) WITH PROPOFOL N/A 07/29/2021   Procedure: ESOPHAGOGASTRODUODENOSCOPY (EGD) WITH PROPOFOL;  Surgeon: Toney Reil, MD;  Location: [facility] ENDOSCOPY;  Service: Gastroenterology;  Laterality: N/A;   GIVENS CAPSULE STUDY N/A 08/08/2021   Procedure: GIVENS CAPSULE STUDY;  Surgeon: Toney Reil, MD;  Location: [facility] ENDOSCOPY;  Service: Gastroenterology;  Laterality: N/A;   LAPAROSCOPY N/A    done at [facility] to release esophageal muscles   Social History:  reports that she quit smoking about 4 years ago. Her smoking use included cigarettes. She started smoking about 49 years ago. She has a 22.5 pack-year smoking history. She has never used smokeless tobacco. She reports that she does not drink alcohol and does not use drugs.  Allergies  Allergen Reactions   Penicillins Hives   Statins Other (See Comments)    Myalgia    Family History  Problem Relation Age of Onset   Arthritis Mother    Heart disease Father    Stroke Father    Breast cancer Neg Hx     Prior to Admission medications   Medication Sig Start Date End Date Taking? Authorizing Provider  albuterol (PROVENTIL HFA;VENTOLIN HFA)  108 (90 Base) MCG/ACT inhaler Inhale into the lungs every 6 (six) hours as needed for wheezing or shortness of breath.    [provider]  aspirin EC 81 MG tablet Take 81 mg by mouth daily. Swallow whole.    [provider]  Cholecalciferol 25 MCG (1000 UT) tablet Take by mouth.    [provider]  ciprofloxacin (CIPRO) 500 MG tablet Take 500 mg by mouth 2 (two) times daily. Patient not taking:  Reported on 06/24/2022 03/22/22   [provider]  diclofenac Sodium (VOLTAREN) 1 % GEL Apply 4 g topically 4 (four) times daily. Patient not taking: Reported on 06/24/2022 12/14/19   Ernestene Kiel T, DPM  donepezil (ARICEPT) 5 MG tablet Take by mouth. 04/07/22   [provider]  escitalopram (LEXAPRO) 10 MG tablet Take 10 mg by mouth daily. Patient not taking: Reported on 06/24/2022 11/09/21   [provider]  escitalopram (LEXAPRO) 10 MG tablet Take by mouth. Patient not taking: Reported on 06/09/2022 10/11/21   [provider]  escitalopram (LEXAPRO) 20 MG tablet Take by mouth. Patient not taking: Reported on 06/09/2022 02/20/22   [provider]  FLUoxetine (PROZAC) 20 MG capsule Take 60 mg by mouth every morning. Patient not taking: Reported on 06/09/2022 01/22/22   [provider]  folic acid (FOLVITE) 1 MG tablet Take by mouth.    [provider]  lidocaine (LIDODERM) 5 % Place 1 patch onto the skin daily. Patient not taking: Reported on 06/24/2022 05/26/21   [provider]  memantine (NAMENDA) 5 MG tablet Take 5 mg by mouth 2 (two) times daily. Patient not taking: Reported on 06/24/2022 01/24/22   [provider]  naloxone [facility]) nasal spray 4 mg/0.1 mL Place into the nose. 08/21/20   [provider]  naloxone [facility]) nasal spray 4 mg/0.1 mL As directed for opioid induced respiratory depression 01/05/23   Yevette Edwards, MD  nitrofurantoin, macrocrystal-monohydrate, (MACROBID) 100 MG capsule Take 100 mg by mouth 2 (two) times daily. 05/20/22   [provider]  OLANZapine (ZYPREXA) 5 MG tablet Take 5 mg by mouth daily. Patient not taking: Reported on 11/04/2021 12/24/18   [provider]  Oxycodone HCl 10 MG TABS Take 1 tablet (10 mg total) by mouth 3 (three) times daily. 05/04/23 06/03/23  Yevette Edwards, MD  Oxycodone HCl 10 MG TABS Take 1 tablet (10 mg total) by mouth in the morning, at noon, and at  bedtime. 06/03/23 07/03/23  Yevette Edwards, MD  pantoprazole (PROTONIX) 20 MG tablet Take 1 tablet (20 mg total) by mouth daily. 07/14/22 01/05/23  Concha Se, MD  QUEtiapine (SEROQUEL) 25 MG tablet Take 25 mg by mouth at bedtime as needed. Patient not taking: Reported on 06/09/2022 04/11/22   [provider]  solifenacin (VESICARE) 10 MG tablet Take 10 mg by mouth daily.    [provider]  solifenacin (VESICARE) 5 MG tablet Take by mouth. Patient not taking: Reported on 06/09/2022 11/15/21   [provider]  solifenacin (VESICARE) 5 MG tablet Take by mouth. Patient not taking: Reported on 06/09/2022 11/15/21   [provider]  sulfamethoxazole-trimethoprim (BACTRIM DS) 800-160 MG tablet Take 1 tablet by mouth 2 (two) times daily. Patient not taking: Reported on 06/09/2022 03/19/22   [provider]  vitamin B-12 (CYANOCOBALAMIN) 100 MCG tablet Take 100 mcg by mouth daily.    [provider]    Physical Exam: Vitals:   05/17/23 2208 05/17/23  2215 05/17/23 2230 05/17/23 2330  BP: 123/75  107/68 119/68  Pulse: 100 91 86 91  Resp: 17 19 19 14   Temp:      SpO2: 91% 91% 91% 92%  Weight:      Height:       General: Frail and cachectic looking female.  Patient is arousable to verbal stimuli.  Unable to provide any significant history. HEENT: Head is atraumatic, neck supple.  Pupils equal round reactive to light and accommodation Neck: Supple with no jugular venous distention Chest clinically diminished due to poor respiratory effort Abdomen scaphoid flat soft nontender. Extremities without pedal edema Skin could not be fully assessed due to patient noncooperation  Data Reviewed: {Sodium is 153, potassium 3.3, chloride 114, bicarb 25,, BUN 48, creatinine 1.22 calcium 10.1 WBCs 18,000, hemoglobin 12.2 hematocrit 38, platelet count 221 glucose was 132 urinalysis shows 67 WBC chest x-ray shows chronic appearing bilateral upper lobe scarring without  any obvious infiltrates  Assessment and Plan:  73 year old with presumed Alzheimer's dementia of early onset who presents on account of altered mental status from baseline.  Workup thus far revealed hyponatremia and acute kidney injury.  Likely due to dehydration.  Acute encephalopathy: Likely metabolic etiology from hyponatremia.  Will treat underlying hyponatremia.  CT head with no evidence of acute intracranial abnormality.  Atrophic and chronic vessel ischemic changes noted.  Encephalopathy likely vascular dementia may be contributory.  Hypernatremia: Likely due to poor free water intake due to advanced dementia.  Patient will be volume repleted with hypotonic fluids with D5 half-normal saline.  Will monitor serum sodium on current regimen.  Acute kidney injury most likely due to acute dehydration in the context of diarrhea and poor fluid and solid intake.  Patient will be volume repleted as appropriate.  Monitor input and output.  Avoid nephrotoxic medication.  Consider renal ultrasound if renal function does not improve with IV fluids.  Failure to thrive in a patient with advanced dementia.  Patient will remain full code for now.  Advance diet as we discussed, husband wish to discuss further with patient's children before making decision.  Patient may benefit from palliative care eval prior to discharge.  Elevated WBC: Likely due to hemoconcentration.  No obvious source of infection.  Urinalysis suggest concentrated urine.  Less likely urinary tract infection.  Patient was given empiric treatment of antibiotics in the ED.  Will continue with antibiotics and de-escalate pending cultures.  Severe monitoring due to poor caloric intake.  SLP team will be consulted to evaluate for swallow abilities.  Supplemental high-protein diet will be ordered.  Known history of GERD/Barrett's esophagus   Advance Care Planning:   Code Status: Full Code   Consults:   Family Communication: Discussed plan of  care with husband who is at bedside.  Severity of Illness: The appropriate patient status for this patient is INPATIENT. Inpatient status is judged to be reasonable and necessary in order to provide the required intensity of service to ensure the patient's safety. The patient's presenting symptoms, physical exam findings, and initial radiographic and laboratory data in the context of their chronic comorbidities is felt to place them at high risk for further clinical deterioration. Furthermore, it is not anticipated that the patient will be medically stable for discharge from the hospital within 2 midnights of admission.   * I certify that at the point of admission it is my clinical judgment that the patient will require inpatient hospital care spanning beyond 2 midnights from the point  of admission due to high intensity of service, high risk for further deterioration and high frequency of surveillance required.*  Author: Lilia Pro, MD 05/18/2023 12:46 AM  For on call review [email or web address].

## 2023-05-18 NOTE — Consult Note (Signed)
 Consultation Note Date: 05/18/2023 at 1000  Patient Name: Katrina Simon  DOB: [birth year]  MRN: [id number]  Age / Sex: 73 y.o., female  PCP: System, Provider Not In Referring Physician: Gillis Santa, MD  HPI/Patient Profile: 73 y.o. female  with past medical history of COPD/emphysema, GERD with short segment of Barrett's esophagus, and dementia admitted on 05/17/2023 with generalized weakness and increased drowsiness.  Patient is being treated for acute encephalopathy likely due to hyponatremia, AKI, FTT, and elevated WBC.  PMT was consulted to support patient and family with goals of care discussions.   Clinical Assessment and Goals of Care: Extensive chart review completed prior to meeting patient including labs, vital signs, imaging, progress notes, orders, and available advanced directive documents from current and previous encounters. I then met with patient and her husband Iantha Fallen to discuss diagnosis prognosis, GOC, EOL wishes, disposition and options.  I introduced Palliative Medicine as specialized medical care for people living with serious illness. It focuses on providing relief from the symptoms and stress of a serious illness. The goal is to improve quality of life for both the patient and the family.  We discussed a brief life review of the patient.  Patient and Iantha Fallen have been married for 34+ years.  They met when patient went to a bar and Iantha Fallen was at the bar bouncer.  They have children from previous relationships.  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  She recently lived with her daughter Florentina Addison and was reunited with her husband when he became legal guardian on 2/3.  Patient is awake and alert, but unable to participate in medical decision making or goals of care discussions.  She smiles appropriately but does not make efforts to engage in discussions.  As far as functional  and nutritional status Iantha Fallen endorses that patient ate well and fed herself up until about a week and a half ago.  She had a few days of diarrhea where she did not want to eat or drink anything, once the diarrhea subsided she continued to not want to eat and drink.  Iantha Fallen shares she is "stubborn" and "determined" so would not use a rollator or cane for stability.  She has had falls in the past and 1 most recently while they were shopping at [place].  He shares that once he realized she was not eating and was unsteady on her feet he brought her to the hospital.   We discussed patient's current illness and what it means in the larger context of patient's on-going co-morbidities.  Natural disease trajectory and expectations at EOL were discussed.  I attempted to elicit values and goals of care important to the patient.  Iantha Fallen shares that Lakeisa's mother and father suffered from dementia before passing.  He and Doll have had extensive discussions years ago about if she were to have dementia.  He says that her goal has always been to be at home.  He shares he retired early to help take care of her and keep her home.  He is hopeful that she can return home and be home with him for as long as possible.  Advance directives, concepts specific to code status, artificial feeding and hydration, and rehospitalization were considered and discussed.  No advance directives have been created.  Full code versus DNR status discussed in detail.  Candace shares that he would like to have patient's children's input before making adjustments to CODE STATUS.  Full code and full scope remain.  Counseled with Iantha Fallen that dementia is a chronic, progressive, and irreversible disease that is often exacerbated and accelerated by acute illnesses and hospitalizations.  He is hopeful that her dehydration has led to her altered mental status.  He is aware that she has dementia that is progresses but is hopeful that she can "come back  to her old self a little bit more".  He states that she is acting more like herself today after receiving the fluids.  Discussed with Reathel and Iantha Fallen the importance of continued conversation with the medical providers regarding overall plan of care and treatment options, ensuring decisions are within the context of the patient's values and GOCs.    Palliative Care services outpatient were explained and offered.  Iantha Fallen would like more time to consider boundaries of care for [facility]. Encouraged Iantha Fallen to consider DNR/DNI status understanding evidenced based poor outcomes in similar hospitalized patients, as the cause of the arrest is likely associated with chronic/terminal disease rather than a reversible acute cardio-pulmonary event.    Questions and concerns were addressed. Iantha Fallen was encouraged to call with questions or concerns. He was in agreement for PMT to continue to follow/support.   Physical Exam Vitals reviewed.  Constitutional:      General: She is not in acute distress.    Appearance: She is normal weight.  HENT:     Head: Normocephalic.     Nose: Nose normal.     Mouth/Throat:     Mouth: Mucous membranes are moist.  Eyes:     Pupils: Pupils are equal, round, and reactive to light.  Pulmonary:     Effort: Pulmonary effort is normal.     Breath sounds: No stridor.  Abdominal:     Palpations: Abdomen is soft.  Skin:    General: Skin is warm and dry.  Neurological:     Mental Status: She is alert.     Comments: Oriented to self  Psychiatric:        Mood and Affect: Mood normal.        Behavior: Behavior normal.     Palliative Assessment/Data: 50-60%     Thank you for this consult. Palliative medicine will continue to follow and assist holistically.   Time Total: 75 minutes  Time spent includes: Detailed review of medical records (labs, imaging, vital signs), medically appropriate exam (mental status, respiratory, cardiac, skin), discussed with treatment  team, counseling and educating patient, family and staff, documenting clinical information, medication management and coordination of care.  Signed by: Georgiann Cocker, DNP, FNP-BC Palliative Medicine   Please contact Palliative Medicine Team providers via [facility] for questions and concerns.

## 2023-05-18 NOTE — ED Notes (Signed)
 This RN and Brayton Caves, NT assisted patient into new brief and repositioned patient in bed. Side rails up and bed locked in lowest position.

## 2023-05-18 NOTE — Plan of Care (Signed)
 Patient was seen and examined at bedside in the ED. Patient was admitted due to failure to thrive, dehydration and hypernatremia.  We will continue IV fluid and current treatment Will reassess tomorrow a.m. for disposition plan Chane OT and TOC consulted

## 2023-05-18 NOTE — Evaluation (Signed)
 Clinical/Bedside Swallow Evaluation Patient Details  Name: Katrina Simon MRN: [id number] Date of Birth: 1951-01-29  Today's Date: 05/18/2023 Time: SLP Start Time (ACUTE ONLY): 0945 SLP Stop Time (ACUTE ONLY): 1015 SLP Time Calculation (min) (ACUTE ONLY): 30 min  Past Medical History:  Past Medical History:  Diagnosis Date   Achalasia    Allergy    Arthritis    Depression    Emphysema of lung (HCC)    Fibromyalgia    Gastroesophageal reflux disease with esophagitis 01/26/2020   Formatting of this note might be different from the original. LA Grade C noted on EGD 01/2020   GERD (gastroesophageal reflux disease)    Herpes genitalis    History of chicken pox    History of kidney stones    Past Surgical History:  Past Surgical History:  Procedure Laterality Date   CESAREAN SECTION     COLONOSCOPY WITH PROPOFOL N/A 10/03/2020   Procedure: COLONOSCOPY WITH PROPOFOL;  Surgeon: Toney Reil, MD;  Location: [facility] ENDOSCOPY;  Service: Gastroenterology;  Laterality: N/A;   COLONOSCOPY WITH PROPOFOL N/A 10/04/2020   Procedure: COLONOSCOPY WITH PROPOFOL;  Surgeon: Toney Reil, MD;  Location: [facility];  Service: Endoscopy;  Laterality: N/A;   ESOPHAGOGASTRODUODENOSCOPY (EGD) WITH PROPOFOL N/A 11/19/2016   Procedure: ESOPHAGOGASTRODUODENOSCOPY (EGD) WITH PROPOFOL;  Surgeon: Scot Jun, MD;  Location: [facility] ENDOSCOPY;  Service: Endoscopy;  Laterality: N/A;   ESOPHAGOGASTRODUODENOSCOPY (EGD) WITH PROPOFOL N/A 07/29/2021   Procedure: ESOPHAGOGASTRODUODENOSCOPY (EGD) WITH PROPOFOL;  Surgeon: Toney Reil, MD;  Location: [facility] ENDOSCOPY;  Service: Gastroenterology;  Laterality: N/A;   GIVENS CAPSULE STUDY N/A 08/08/2021   Procedure: GIVENS CAPSULE STUDY;  Surgeon: Toney Reil, MD;  Location: [facility]COPY;  Service: Gastroenterology;  Laterality: N/A;   LAPAROSCOPY N/A    done at [facility] to release esophageal muscles   HPI:  Per H&P: "Katrina Simon is a 73 y.o. female with medical history significant of progressive worsening dementia over the past 1 and half years, COPD/emphysema, history of GERD/short segment of Barrett's esophagus.  Patient lives with her husband.  She is ADL and IADL dependent.  She was brought in by her husband tonight on account of generalized weakness and increased drowsiness.  Per husband who is the primary caretaker, patient has not been eating or drinking well over the past 3 days.  Symptoms were preceded by 3 days of diarrhea.  No reported fever or chills.  No nausea or vomiting.  She was also noted to have progressive weight loss over the past several months after custody battle with daughter." Pt is now admitted with acute encephalopathy and failure to thrive. Imaging: CXR 05/17/23: Chronic appearing bilateral upper lobe scarring and/or atelectasis. Mild left basilar atelectasis and/or infiltrate. CT Head 05/17/23:  No CT evidence for acute intracranial abnormality. Atrophy and chronic small vessel ischemic changes of the white matter    Assessment / Plan / Recommendation  Clinical Impression  Pt seen for bedside swallow assessment in the setting of reduced PO intake and altered mentation. Spouse present for duration of session. Chart review revealing recent hx of dysphagia, with MBSS completed at outside hospital on 02/10/23, revealing, "moderate oropharyngeal dysphagia. silent aspiration of thin/0 liquids. Patient unable to follow commands to eject material from airway nor use strategies to prevent aspiration. Airway invasion with mildly/2 nectar thick liquids. Swallow c/b - reduced soft palate elevation - impaired laryngeal elevation, hyoid excursion and absent epiglottic inversion allowing for airway invasion  - impaired  base of tongue retraction, pharyngeal stripping wave, and absent epiglottic inversion allowing for mild pharyngeal residue - reduced extent and duration of UES opening allowed for and impression on bolus  flow & a small Zenker's diverticulum is appreciated  - impaired laryngopharyngeal sensation allowed for a lack of airway protective responses during airway invasion events (aka silent aspiration)"   Pt's spouse reports in the last week pt has needed total support for feeding and pt has demonstrated resistance to opening mouth for oral care/medications/food. Continued use of thick liquids are still utilized at home.   Today, pt is alert and agreeable to assessment. Pt on 2L nasal canula with O2 saturations maintained at 93-96 for duration of session. Reduced command following limiting completion of oral mech exam- with no overt asymmetry/weakness noted. Pt with ill-fitting top dentures, that limited complete control of oral cavity. Trials completed with thin liquids and puree solids. No overt or subtle s/sx pharyngeal dysphagia noted. No change to vocal quality across trials. Vitals stable for duration of trials. However, given recent history of silent aspiration with thin liquids, aspiration cannot be ruled out and thin liquids were not further tested. Mildly prolonged oral phase with puree solids noted, with extended time provided. Hand over hand assist provided for use of spoon and cup during trials to increase pt engagement in PO trials.   Education shared with spouse regarding risk for aspiration, potential impact of known esophageal complications on oropharyngeal swallow, and impact of cognition/engagement in swallow function. Spouse endorsed understanding across education.   Given results of recent of MBSS and current cognitive status, recommend initiation of nectar thick liquids and Dys 1 puree solids. Medications crushed in puree. 100% supervision and support for feeding. Trial hand over hand for increased engagement in act of eating. Based on known dysphagia, current mentation/deconditioning, and premorbid conditions, recommend aspiration precautions, including slow rate, small bites, elevated HOB,  alert for PO intake. SLP will continue to follow for dysphagia intervention.   SLP Visit Diagnosis: Dysphagia, oropharyngeal phase (R13.12)    Aspiration Risk  Moderate aspiration risk    Diet Recommendation   Dysphagia 1 (puree);Nectar  Medication Administration: Crushed with puree    Other  Recommendations Recommended Consults: Consider GI evaluation (if acute esophageal complaints) Oral Care Recommendations: Oral care BID;Staff/trained caregiver to provide oral care Caregiver Recommendations: Avoid jello, ice cream, thin soups, popsicles;Remove water pitcher    Recommendations for follow up therapy are one component of a multi-disciplinary discharge planning process, led by the attending physician.  Recommendations may be updated based on patient status, additional functional criteria and insurance authorization.  Follow up Recommendations Follow physician's recommendations for discharge plan and follow up therapies      Assistance Recommended at Discharge    Functional Status Assessment Patient has had a recent decline in their functional status and demonstrates the ability to make significant improvements in function in a reasonable and predictable amount of time.  Frequency and Duration min 2x/week  1 week       Prognosis Prognosis for improved oropharyngeal function: Fair Barriers to Reach Goals: Cognitive deficits;Time post onset;Severity of deficits      Swallow Study   General Date of Onset: 05/18/23 HPI: Per H&P: "Katrina Simon is a 73 y.o. female with medical history significant of progressive worsening dementia over the past 1 and half years, COPD/emphysema, history of GERD/short segment of Barrett's esophagus.  Patient lives with her husband.  She is ADL and IADL dependent.  She was brought in  by her husband tonight on account of generalized weakness and increased drowsiness.  Per husband who is the primary caretaker, patient has not been eating or drinking well over  the past 3 days.  Symptoms were preceded by 3 days of diarrhea.  No reported fever or chills.  No nausea or vomiting.  She was also noted to have progressive weight loss over the past several months after custody battle with daughter." Pt is now admitted with acute encephalopathy and failure to thrive. Imaging: CXR 05/17/23: Chronic appearing bilateral upper lobe scarring and/or atelectasis. Mild left basilar atelectasis and/or infiltrate. CT Head 05/17/23:  No CT evidence for acute intracranial abnormality. Atrophy and chronic small vessel ischemic changes of the white matter Type of Study: Bedside Swallow Evaluation Previous Swallow Assessment: MBSS on 02/10/23- recommending nectar thick liquids Diet Prior to this Study: Thin liquids (Level 0);Clear liquid diet Temperature Spikes Noted: No (99.2; WBC 12.6) Respiratory Status: Nasal cannula (2L) History of Recent Intubation: No Behavior/Cognition: Alert;Cooperative;Confused Oral Cavity Assessment: Within Functional Limits Oral Care Completed by SLP: No (reported recent completion) Oral Cavity - Dentition: Dentures, top;Other (Comment);Dentures, bottom (ill fitting) Vision:  (did not test) Self-Feeding Abilities: Needs assist Patient Positioning: Upright in bed Baseline Vocal Quality:  (minimal voicing) Volitional Cough: Cognitively unable to elicit Volitional Swallow: Unable to elicit    Oral/Motor/Sensory Function Overall Oral Motor/Sensory Function: Within functional limits   Ice Chips Ice chips: Not tested   Thin Liquid Thin Liquid: Within functional limits Presentation: Straw (small sips) Other Comments: though cannot rule out silent aspiration    Nectar Thick Other Comments: spouse reports use at home with out issue   Honey Thick Honey Thick Liquid: Not tested   Puree Puree: Impaired Presentation: Spoon Oral Phase Impairments: Poor awareness of bolus;Impaired mastication Oral Phase Functional Implications: Prolonged oral  transit Pharyngeal Phase Impairments:  (none) Other Comments: mildly prolonged   Solid     Solid: Not tested Other Comments: not tested secondary to cognition and dentition     [place] Laurent Cargile Clapp, MS, CCC-SLP Speech Language Pathologist Rehab Services; [facility] [phone] (ascom)   [place] J Clapp 05/18/2023,11:29 AM

## 2023-05-18 NOTE — ED Notes (Signed)
 Husband at bedside assisting pt with lunch

## 2023-05-19 ENCOUNTER — Telehealth: Payer: Self-pay | Admitting: Anesthesiology

## 2023-05-19 DIAGNOSIS — E870 Hyperosmolality and hypernatremia: Secondary | ICD-10-CM | POA: Diagnosis not present

## 2023-05-19 DIAGNOSIS — G309 Alzheimer's disease, unspecified: Secondary | ICD-10-CM | POA: Diagnosis not present

## 2023-05-19 DIAGNOSIS — R627 Adult failure to thrive: Secondary | ICD-10-CM | POA: Diagnosis not present

## 2023-05-19 DIAGNOSIS — N179 Acute kidney failure, unspecified: Secondary | ICD-10-CM | POA: Diagnosis not present

## 2023-05-19 LAB — CBC
HCT: 28.9 % — ABNORMAL LOW (ref 36.0–46.0)
Hemoglobin: 9.4 g/dL — ABNORMAL LOW (ref 12.0–15.0)
MCH: 30.3 pg (ref 26.0–34.0)
MCHC: 32.5 g/dL (ref 30.0–36.0)
MCV: 93.2 fL (ref 80.0–100.0)
Platelets: 160 10*3/uL (ref 150–400)
RBC: 3.1 MIL/uL — ABNORMAL LOW (ref 3.87–5.11)
RDW: 14.2 % (ref 11.5–15.5)
WBC: 9.7 10*3/uL (ref 4.0–10.5)
nRBC: 0 % (ref 0.0–0.2)

## 2023-05-19 LAB — BASIC METABOLIC PANEL
Anion gap: 10 (ref 5–15)
BUN: 27 mg/dL — ABNORMAL HIGH (ref 8–23)
CO2: 24 mmol/L (ref 22–32)
Calcium: 8.1 mg/dL — ABNORMAL LOW (ref 8.9–10.3)
Chloride: 109 mmol/L (ref 98–111)
Creatinine, Ser: 0.78 mg/dL (ref 0.44–1.00)
GFR, Estimated: >60 mL/min (ref >60)
Glucose, Bld: 117 mg/dL — ABNORMAL HIGH (ref 70–99)
Potassium: 3.3 mmol/L — ABNORMAL LOW (ref 3.5–5.1)
Sodium: 143 mmol/L (ref 135–145)

## 2023-05-19 LAB — MAGNESIUM: Magnesium: 1.8 mg/dL (ref 1.7–2.4)

## 2023-05-19 LAB — PHOSPHORUS: Phosphorus: 6.2 mg/dL — ABNORMAL HIGH (ref 2.5–4.6)

## 2023-05-19 MED ORDER — POTASSIUM CHLORIDE 20 MEQ PO PACK
40.0000 meq | PACK | Freq: Once | ORAL | Status: AC
  Administered 2023-05-19: 40 meq via ORAL
  Filled 2023-05-19: qty 2

## 2023-05-19 MED ORDER — NITROFURANTOIN MONOHYD MACRO 100 MG PO CAPS
100.0000 mg | ORAL_CAPSULE | Freq: Two times a day (BID) | ORAL | Status: DC
  Administered 2023-05-19 – 2023-05-21 (×5): 100 mg via ORAL
  Filled 2023-05-19 (×5): qty 1

## 2023-05-19 MED ORDER — ENOXAPARIN SODIUM 40 MG/0.4ML IJ SOSY
40.0000 mg | PREFILLED_SYRINGE | Freq: Every evening | INTRAMUSCULAR | Status: DC
  Administered 2023-05-19 – 2023-05-20 (×2): 40 mg via SUBCUTANEOUS
  Filled 2023-05-19 (×2): qty 0.4

## 2023-05-19 MED ORDER — POTASSIUM CHLORIDE CRYS ER 20 MEQ PO TBCR: 40.0000 meq | EXTENDED_RELEASE_TABLET | Freq: Once | ORAL | Status: DC

## 2023-05-19 MED ORDER — SODIUM CHLORIDE 0.45 % IV SOLN: INTRAVENOUS | Status: DC

## 2023-05-19 NOTE — Plan of Care (Signed)

## 2023-05-19 NOTE — Progress Notes (Signed)
 Palliative Care Progress Note, Assessment & Plan   Patient Name: Katrina Simon       Date: 05/19/2023 DOB: [birth year]  Age: 73 y.o. MRN#: [id number] Attending Physician: Gillis Santa, MD Primary Care Physician: System, Provider Not In Admit Date: 05/17/2023  Subjective: Patient is sitting up in bed, awake and alert.  She acknowledges my presence.  She does not engage in conversation.  She is unable to make her wishes known.  When asked what her name is, she is unable to respond.  When asked if she needs anything, she just stares and makes no vocalizations.  Dr. Lucianne Muss, SLP therapist Samara Deist, and patient's husband Katrina Simon present during my visit.  HPI: 73 y.o. female  with past medical history of COPD/emphysema, GERD with short segment of Barrett's esophagus, and dementia admitted on 05/17/2023 with generalized weakness and increased drowsiness.   Patient is being treated for acute encephalopathy likely due to hyponatremia, AKI, FTT, and elevated WBC.   PMT was consulted to support patient and family with goals of care discussions.   Summary of counseling/coordination of care: Extensive chart review completed prior to meeting patient including labs, vital signs, imaging, progress notes, orders, and available advanced directive documents from current and previous encounters.   After reviewing the patient's chart and assessing the patient at bedside, I spoke with patient and her husband in regards to symptom management and goals of care.   Dr. Lucianne Muss highlighted the importance of CODE STATUS and advanced care planning.  SLP therapist discussed dementias effects on patient's ability to take in p.o. intake safely.    After SLP therapist and attending have left the room, I spoke with patient and her husband in  regards to plan of care.  Katrina Simon continues to endorse that patient said several years ago that she wants to be at home as long as possible.  He shares he is going to uphold his promise to keep her at home as long as possible and for as long as he is able.  He never wants to put her in the home unless it is absolutely necessary.  Again discussed importance of putting boundaries in place as not to do harm to patient should she had a cardiopulmonary arrest.  We discussed that patient is not actively dying but that setting boundaries in advance of an emergency situation can alleviate pain and suffering for patients at end-of-life.  Katrina Simon says that he knows that he needs to make a decision about her CODE STATUS and boundaries of care.  However, he wants to speak with their children before making any adjustment.  To support Catephen these discussions with family, I provided a copy of hard choices for loving people is where is a blank copy of a MOST form.  Reviewed that sometimes seeing facts and research and black-and-white can be a helpful tool when discussing tough topics such as CODE STATUS and end-of-life care for a loved 1.  He was appreciative of this paperwork and shares that it would probably be helpful when he is able to speak to her family at a later date/time.  I shared with Candace that PMT will step back from daily visits.  We remain available to  patient and family throughout her hospitalization.  We will intervene should we see any acute palliative needs arise.  Katrina Simon has PMT contact info and was encouraged to reach out if goals change or if he has any acute palliative needs.  Full code and full scope remain.  PMT will shadow the patient's chart and monitor her peripherally.  Please reengage if goals change, at patient/family's request, or patient's health deteriorates during hospitalization.  Physical Exam Vitals reviewed.  Constitutional:      General: She is not in acute distress.     Appearance: She is normal weight.  HENT:     Head: Normocephalic.     Nose: Nose normal.     Mouth/Throat:     Mouth: Mucous membranes are moist.  Eyes:     Pupils: Pupils are equal, round, and reactive to light.  Pulmonary:     Effort: Pulmonary effort is normal.  Abdominal:     Palpations: Abdomen is soft.  Skin:    General: Skin is warm and dry.  Neurological:     Mental Status: She is alert.  Psychiatric:        Mood and Affect: Mood normal.        Behavior: Behavior normal.             Total Time 35 minutes   Time spent includes: Detailed review of medical records (labs, imaging, vital signs), medically appropriate exam (mental status, respiratory, cardiac, skin), discussed with treatment team, counseling and educating patient, family and staff, documenting clinical information, medication management and coordination of care.  Samara Deist L. Bonita Quin, DNP, FNP-BC Palliative Medicine Team

## 2023-05-19 NOTE — Evaluation (Signed)
 Physical Therapy Evaluation Patient Details Name: Katrina Simon MRN: [id number] DOB: September 22, 1950 Today's Date: 05/19/2023  History of Present Illness  73 y/o female presented to ED on 05/17/23 for increased weakness and diarrhea. Admitted for acute encephalopathy, AKI, and failure to thrive. PMH: COPD, emphysema, dementia, GERD with Barrett's esophagus  Clinical Impression  Patient admitted with the above. PTA, patient lives with husband who assists with all mobility and ADLs/iADLs. Husband uses HHA for mobility as patient refuses to use rollator or SPC. Patient oriented to self and follows commands inconsistently with repetition and max verbal and tactile cueing. Required minA for bed mobility and sit to stand for initiation. Ambulated 6' with face to face HHA and minA+2 with posterior bias throughout despite cueing. Husband is able to provide necessary assistance at discharge and would benefit from wheelchair to assist with mobility. Patient will benefit from skilled PT services during acute stay to address listed deficits. Patient will benefit from ongoing therapy at discharge to maximize functional independence and safety.         If plan is discharge home, recommend the following: A lot of help with walking and/or transfers;A lot of help with bathing/dressing/bathroom;Assistance with cooking/housework;Assistance with feeding;Direct supervision/assist for medications management;Assist for transportation;Direct supervision/assist for financial management   Can travel by private vehicle        Equipment Recommendations Wheelchair (measurements PT);Wheelchair cushion (measurements PT)  Recommendations for Other Services       Functional Status Assessment Patient has had a recent decline in their functional status and demonstrates the ability to make significant improvements in function in a reasonable and predictable amount of time.     Precautions / Restrictions Precautions Precautions:  Fall Recall of Precautions/Restrictions: Impaired Restrictions Weight Bearing Restrictions Per Provider Order: No      Mobility  Bed Mobility Overal bed mobility: Needs Assistance Bed Mobility: Supine to Sit, Sit to Supine     Supine to sit: Min assist Sit to supine: Min assist        Transfers Overall transfer level: Needs assistance Equipment used: 1 person hand held assist (face to face HHA) Transfers: Sit to/from Stand Sit to Stand: Min assist, +2 safety/equipment           General transfer comment: assist to initiate standing from low bed surface with face to face HHA    Ambulation/Gait Ambulation/Gait assistance: Min assist, +2 physical assistance Gait Distance (Feet): 6 Feet Assistive device: 1 person hand held assist (face to face HHA) Gait Pattern/deviations: Step-to pattern, Decreased stride length Gait velocity: decreased     General Gait Details: posterior bias in standing requiring assist to maintain balance. Face to face HHA with visual and tactile cueing to move feet  Stairs            Wheelchair Mobility     Tilt Bed    Modified Rankin (Stroke Patients Only)       Balance Overall balance assessment: Needs assistance Sitting-balance support: No upper extremity supported, Feet supported Sitting balance-Leahy Scale: Fair     Standing balance support: Bilateral upper extremity supported, During functional activity Standing balance-Leahy Scale: Poor                               Pertinent Vitals/Pain Pain Assessment Pain Assessment: PAINAD Breathing: normal Negative Vocalization: none Facial Expression: smiling or inexpressive Body Language: relaxed Consolability: no need to console PAINAD Score: 0 Pain Intervention(s): Monitored during session  Home Living Family/patient expects to be discharged to:: Private residence Living Arrangements: Spouse/significant other;Children (son and granddaughter) Available  Help at Discharge: Family;Available PRN/intermittently Type of Home: Mobile home Home Access: Stairs to enter Entrance Stairs-Rails: None Entrance Stairs-Number of Steps: 2   Home Layout: One level Home Equipment: Rollator (4 wheels);Cane - single point      Prior Function Prior Level of Function : Needs assist  Cognitive Assist : ADLs (cognitive)     Physical Assist : ADLs (physical)     Mobility Comments: hands on assist for all mobility; pt refuses to use AD/DME ADLs Comments: spouse assists with all ADLs d/t worsening dementia; spouse began feeding her the last several weeks     Extremity/Trunk Assessment                Communication   Communication Communication: Impaired Factors Affecting Communication: Difficulty expressing self;Reduced clarity of speech    Cognition Arousal: Alert Behavior During Therapy: WFL for tasks assessed/performed   PT - Cognitive impairments: History of cognitive impairments                         Following commands: Impaired Following commands impaired: Follows one step commands inconsistently     Cueing Cueing Techniques: Verbal cues, Gestural cues, Tactile cues, Visual cues     General Comments General comments (skin integrity, edema, etc.): SpO2>92% on RA throughout session    Exercises     Assessment/Plan    PT Assessment Patient needs continued PT services  PT Problem List Decreased strength;Decreased activity tolerance;Decreased balance;Decreased mobility;Decreased knowledge of use of DME;Decreased safety awareness;Decreased cognition;Decreased knowledge of precautions;Cardiopulmonary status limiting activity       PT Treatment Interventions DME instruction;Gait training;Therapeutic activities;Functional mobility training;Therapeutic exercise;Balance training;Patient/family education    PT Goals (Current goals can be found in the Care Plan section)  Acute Rehab PT Goals Patient Stated Goal: did not  state PT Goal Formulation: With family Time For Goal Achievement: 06/02/23 Potential to Achieve Goals: Fair    Frequency Min 1X/week     Co-evaluation PT/OT/SLP Co-Evaluation/Treatment: Yes Reason for Co-Treatment: To address functional/ADL transfers;For patient/therapist safety;Necessary to address cognition/behavior during functional activity PT goals addressed during session: Mobility/safety with mobility;Balance         AM-PAC PT "6 Clicks" Mobility  Outcome Measure Help needed turning from your back to your side while in a flat bed without using bedrails?: A Little Help needed moving from lying on your back to sitting on the side of a flat bed without using bedrails?: A Little Help needed moving to and from a bed to a chair (including a wheelchair)?: A Little Help needed standing up from a chair using your arms (e.g., wheelchair or bedside chair)?: A Little Help needed to walk in hospital room?: A Little Help needed climbing 3-5 steps with a railing? : Total 6 Click Score: 16    End of Session   Activity Tolerance: Patient tolerated treatment well Patient left: in bed;with call bell/phone within reach;with bed alarm set;with family/visitor present Nurse Communication: Mobility status PT Visit Diagnosis: Unsteadiness on feet (R26.81);Muscle weakness (generalized) (M62.81);Difficulty in walking, not elsewhere classified (R26.2);Other abnormalities of gait and mobility (R26.89)    Time: 1610-9604 PT Time Calculation (min) (ACUTE ONLY): 25 min   Charges:   PT Evaluation $PT Eval Moderate Complexity: 1 Mod   PT General Charges $$ ACUTE PT VISIT: 1 Visit         Maylon Peppers, PT,  DPT Physical Therapist - [facility]  [facility]   Levoy Geisen A Duward Allbritton 05/19/2023, 11:23 AM

## 2023-05-19 NOTE — Evaluation (Signed)
 Occupational Therapy Evaluation Patient Details Name: Katrina Simon MRN: [id number] DOB: [birth year] Today's Date: 05/19/2023   History of Present Illness   73 y/o female presented to ED on 05/17/23 for increased weakness and diarrhea. Admitted for acute encephalopathy, AKI, and failure to thrive. PMH: COPD, emphysema, dementia, GERD with Barrett's esophagus     Clinical Impressions Pt was seen for OT evaluation this date. PTA, pt was living at home with her husband, son and granddaughter. Her husband is her primary caregiver and provides Max/total assist for all ADLs, including beginning to feed her over the last week or two. He provides HHA in one hand at baseline for all mobility d/t refusing to use RW or cane.  Pt presents to acute OT demonstrating impaired ADL performance and functional mobility 2/2 weakness, balance deficits, and her dementia. Oriented to self only and difficulty following one step commands. Pt currently requires Min A and multi modal cues for all initiation and activity. Pt minimally verbal during session, but spouse reports this is better than it has been the last week. Good seated balance at EOB. STS from EOB with Min A x2 d/t posterior lean in standing, HHA x1 via face to face while OT provided peri-care with total assist. Pt took forward/backward steps x2 trials during session with PT/OT/spouse requiring increased time and cueing. Wheelchair recommended to maximize ease of transport for pt's spouse. Pt would benefit from skilled OT services to address noted impairments and functional limitations (see below for any additional details) in order to maximize safety and independence while minimizing falls risk and caregiver burden. Do anticipate the need for follow up OT services upon acute hospital DC.      If plan is discharge home, recommend the following:   A little help with walking and/or transfers;A lot of help with bathing/dressing/bathroom;Direct supervision/assist  for medications management;Supervision due to cognitive status;Direct supervision/assist for financial management;Assistance with cooking/housework;Help with stairs or ramp for entrance     Functional Status Assessment   Patient has had a recent decline in their functional status and demonstrates the ability to make significant improvements in function in a reasonable and predictable amount of time.     Equipment Recommendations   Wheelchair (measurements OT);Tub/shower seat     Recommendations for Other Services         Precautions/Restrictions   Precautions Precautions: Fall Recall of Precautions/Restrictions: Impaired Restrictions Weight Bearing Restrictions Per Provider Order: No     Mobility Bed Mobility Overal bed mobility: Needs Assistance Bed Mobility: Supine to Sit, Sit to Supine     Supine to sit: Min assist Sit to supine: Min assist        Transfers Overall transfer level: Needs assistance Equipment used: 1 person hand held assist Transfers: Sit to/from Stand Sit to Stand: Min assist, +2 safety/equipment           General transfer comment: cueing to initiate all tasks via multi modal cues; Min A x2 for STS and progressing mobility-did better with spouse; HHA x1 and posterior support from other therapist      Balance Overall balance assessment: Needs assistance Sitting-balance support: No upper extremity supported, Feet supported Sitting balance-Leahy Scale: Fair     Standing balance support: Bilateral upper extremity supported, During functional activity Standing balance-Leahy Scale: Poor                             ADL either performed or assessed with clinical judgement  ADL Overall ADL's : At baseline                                       General ADL Comments: spouse was providing total/max A at baseline and recently began feeding pt as well     Vision         Perception         Praxis          Pertinent Vitals/Pain Pain Assessment Pain Assessment: PAINAD Breathing: normal Negative Vocalization: none Facial Expression: smiling or inexpressive Body Language: relaxed Consolability: no need to console PAINAD Score: 0 Pain Intervention(s): Monitored during session     Extremity/Trunk Assessment Upper Extremity Assessment Upper Extremity Assessment: Generalized weakness   Lower Extremity Assessment Lower Extremity Assessment: Generalized weakness       Communication Communication Communication: Impaired Factors Affecting Communication: Difficulty expressing self;Reduced clarity of speech   Cognition Arousal: Alert Behavior During Therapy: WFL for tasks assessed/performed Cognition: History of cognitive impairments                               Following commands: Impaired Following commands impaired: Follows one step commands inconsistently     Cueing  General Comments   Cueing Techniques: Verbal cues;Gestural cues;Tactile cues;Visual cues  sp02 WFL on RA throughout session   Exercises Other Exercises Other Exercises: Edu spouse on role of OT in acute setting and using W/C as needed for transport.   Shoulder Instructions      Home Living Family/patient expects to be discharged to:: Private residence Living Arrangements: Spouse/significant other;Children (son and granddaughter) Available Help at Discharge: Family;Available PRN/intermittently Type of Home: Mobile home Home Access: Stairs to enter Entrance Stairs-Number of Steps: 2 Entrance Stairs-Rails: None Home Layout: One level     Bathroom Shower/Tub: Producer, television/film/video: Handicapped height     Home Equipment: Rollator (4 wheels);Cane - single point          Prior Functioning/Environment Prior Level of Function : Needs assist  Cognitive Assist : ADLs (cognitive)     Physical Assist : ADLs (physical)     Mobility Comments: hands on assist for all mobility;  pt refuses to use AD/DME ADLs Comments: spouse assists with all ADLs d/t worsening dementia; spouse began feeding her the last several weeks    OT Problem List: Decreased strength;Decreased safety awareness;Impaired balance (sitting and/or standing)   OT Treatment/Interventions: Self-care/ADL training;Patient/family education;DME and/or AE instruction      OT Goals(Current goals can be found in the care plan section)   Acute Rehab OT Goals Patient Stated Goal: return home with spouse OT Goal Formulation: With family Time For Goal Achievement: 06/02/23 Potential to Achieve Goals: Good ADL Goals Pt Will Transfer to Toilet: with min assist;with contact guard assist;ambulating;regular height toilet Pt Will Perform Tub/Shower Transfer: Shower transfer;with mod assist;ambulating   OT Frequency:  Min 1X/week    Co-evaluation   Reason for Co-Treatment: To address functional/ADL transfers;For patient/therapist safety;Necessary to address cognition/behavior during functional activity PT goals addressed during session: Mobility/safety with mobility;Balance        AM-PAC OT "6 Clicks" Daily Activity     Outcome Measure Help from another person eating meals?: A Lot Help from another person taking care of personal grooming?: Total Help from another person toileting, which includes using toliet, bedpan, or urinal?:  Total Help from another person bathing (including washing, rinsing, drying)?: Total Help from another person to put on and taking off regular upper body clothing?: Total Help from another person to put on and taking off regular lower body clothing?: Total 6 Click Score: 7   End of Session Nurse Communication: Mobility status  Activity Tolerance: Patient tolerated treatment well Patient left: in bed;with call bell/phone within reach;with bed alarm set  OT Visit Diagnosis: Other abnormalities of gait and mobility (R26.89);Muscle weakness (generalized) (M62.81)                 Time: 2025-4270 OT Time Calculation (min): 25 min Charges:  OT General Charges $OT Visit: 1 Visit OT Evaluation $OT Eval Moderate Complexity: 1 Mod Mayjor Ager, OTR/L 05/19/23, 12:17 PM  Lenix Kidd E Vega Stare 05/19/2023, 12:12 PM

## 2023-05-19 NOTE — Telephone Encounter (Signed)
 Patient is admitted to hospital right now. Dehydration and low potassium. Husband  is concerned about getting UDS. Please advise husband

## 2023-05-19 NOTE — Progress Notes (Signed)
 Speech Language Pathology Treatment: Dysphagia  Patient Details Name: Katrina Simon MRN: [id number] DOB: 09-14-1950 Today's Date: 05/19/2023 Time: 1040-1130 SLP Time Calculation (min) (ACUTE ONLY): 50 min  Assessment / Plan / Recommendation Clinical Impression  Pt seen today for ongoing assessment of swallowing; she was alert/awake, intermittently verbal and responsive w/ attempts at conversation w/ SLP, Husband. D/t the Cognitive decline, she had difficulty completing thoughts verbally.  Pt is on RA; wbc wnl. Afebrile. Dentures Not in place.  Pt and husband explained general aspiration precautions; husband agreed to the need for following them in setting of Dementia, especially sitting upright for all oral intake and reducing distractions during oral intake. Pt assisted w/ positioning then presented the Nectar liquids w/ Potassium(med mixed into drink by NSG). Pt held the cup and sipped on the Nectar liquids via straw w/ No overt clinical s/s of aspiration noted; no cough, respiratory status remained calm and unlabored, vocal quality clear b/t trials. Pt also given bites of Puree which she tolerated well; min increased oral A-P transfer time but overall functional. Oral phase appeared grossly [facility] for bolus management and timely A-P transfer for swallowing of the Nectar liquids. Oral clearing achieved w/ trials. NSG denied any deficits in swallowing as well. Husband stated she does "hold the food in her mouth sometimes"; even the pureed consistency foods.    Pt appears at reduced risk for aspriation when following general aspiration precautions and when given Supervision to check for oral clearing; there is risk for aspiration/aspiration pneumonia in setting of Baseline Dementia and Dysphagia. Recommend continue current Dysphagia level 1 diet w/ gravies added to moisten/flavor foods; Nectar consistency liquids. Recommend general aspiration precautions; Pills CRUSHED in Puree; tray setup and positioning  assistance for meals. Feeding Support and monitoring during meals -- check for oral clearing post meal. ST services will sign off at this time as pt appears at/near her Baseline in setting of Cognitive decline/Dementia. This diet consistency will best support pt's oral intake and nutrition overall. MD to reconsult if new needs while admitted. NSG updated. Precautions posted at bedside. Recommend Dietician and Palliative Care f/u for support.   Education w/ Husband/pt in room. Husband present for Education on swallowing; Dementia and its impact on swallowing; Dysphagia; aspiration/aspiration pneumonia risk; aspiration precautions; dysphagia diet consistency recommended. Husband agreed.       HPI HPI: Per H&P: "Katrina Simon is a 73 y.o. female with medical history significant of progressive worsening Dementia over the past 1 and half years, COPD/emphysema, history of GERD/short segment of Barrett's esophagus.  Patient lives with her husband.  She is ADL and IADL dependent.  She was brought in by her husband tonight on account of generalized weakness and increased drowsiness.  Per husband who is the primary caretaker, patient has not been eating or drinking well over the past 3 days.  Symptoms were preceded by 3 days of diarrhea.  No reported fever or chills.  No nausea or vomiting.  She was also noted to have progressive weight loss over the past several months after custody battle with daughter." Pt is now admitted with acute encephalopathy and failure to thrive. Imaging: CXR 05/17/23: Chronic appearing bilateral upper lobe scarring and/or atelectasis. Mild left basilar atelectasis and/or infiltrate. CT Head 05/17/23:  No CT evidence for acute intracranial abnormality. Atrophy and chronic small vessel ischemic changes of the white matter      SLP Plan  All goals met      Recommendations for follow up therapy are  one component of a multi-disciplinary discharge planning process, led by the attending  physician.  Recommendations may be updated based on patient status, additional functional criteria and insurance authorization.    Recommendations  Diet recommendations: Dysphagia 1 (puree);Nectar-thick liquid Liquids provided via: Cup;Straw (monitor) Medication Administration: Crushed with puree Supervision: Patient able to self feed;Staff to assist with self feeding;Full supervision/cueing for compensatory strategies Compensations: Minimize environmental distractions;Slow rate;Small sips/bites;Lingual sweep for clearance of pocketing;Multiple dry swallows after each bite/sip;Follow solids with liquid Postural Changes and/or Swallow Maneuvers: Out of bed for meals;Seated upright 90 degrees;Upright 30-60 min after meal                 (Palliative Care following; dietician) Oral care BID;Oral care before and after PO;Staff/trained caregiver to provide oral care (denture care)   Frequent or constant Supervision/Assistance Dysphagia, oropharyngeal phase (R13.12) (Baseline Dementia, dysphagia)     All goals met       Jerilynn Som, MS, CCC-SLP Speech Language Pathologist Rehab Services; [facility] [phone] (ascom) Kouper Spinella  05/19/2023, 5:44 PM

## 2023-05-19 NOTE — Telephone Encounter (Signed)
 Husband informed no UDS ordered, he was just offering to collect it while she is in the hospital if needed.Marland Kitchen

## 2023-05-19 NOTE — Progress Notes (Signed)
 [facility] Progress Note  Patient: Katrina Simon    ZOX:[id number]  DOA: 05/17/2023     Date of Service: the patient was seen and examined on 05/19/2023  Chief Complaint  Patient presents with   Weakness   Brief hospital course: LINNIE DELGRANDE is a 73 y.o. female with medical history significant of progressive worsening dementia over the past 1 and half years, COPD/emphysema, history of GERD/short segment of Barrett's esophagus.  Patient lives with her husband.  She is ADL and IADL dependent.  She was brought in by her husband tonight on account of generalized weakness and increased drowsiness.  Per husband who is the primary caretaker, patient has not been eating or drinking well over the past 3 days.  Symptoms were preceded by 3 days of diarrhea.  No reported fever or chills.  No nausea or vomiting.  She was also noted to have progressive weight loss over the past several months after custody battle with daughter.    Assessment and Plan: 32 year old with presumed Alzheimer's dementia of early onset who presents on account of altered mental status from baseline.  Workup thus far revealed hyponatremia and acute kidney injury.  Likely due to dehydration.    UTI UA positive Leukocytosis resolved S/p ceftriaxone, transition to Macrobid 100 mg p.o. twice daily Urine culture growing 80 K Enterobacter faecalis and 20K GNR Follow Ucx sensitivity report and change antibiotics accordingly    Acute metabolic encephalopathy: Multifactorial due to UTI, hyponatremia, AKI and dehydration CT head with no evidence of acute intracranial abnormality.  Atrophic and chronic vessel ischemic changes noted.  Encephalopathy likely vascular dementia may be contributory. Continue supportive care and treat underlying cause   Hypernatremia: Likely due to poor free water intake due to advanced dementia.   S/p 0.45 saline/D5, sodium level improved, IV fluid to 0.45 saline 75 mL/h Na 148--143  Sodium level  gradually improving Monitor BMP  Hypokalemia, potassium repleted. Monitor electrolytes    Acute kidney injury due to dehydration Creatinine 1.22 on admission Cr 0.78, Creatinine improved after IV fluid Monitor renal functions   Failure to thrive with advanced dementia.   Continue Aricept Started Megace to stimulate appetite Palliative care consulted for goals of care discussion. Patient remained full code     Severe protein calorie malnutrition due to decreased p.o. intake SLP eval done, started dysphagia 1 diet with nectar thick liquids Continue Ensure protein supplements    Body mass index is 18.3 kg/m.  Interventions:  Diet: Dysphagia grade 1 diet with nectar thick liquids DVT Prophylaxis: Subcutaneous Lovenox   Advance goals of care discussion: Full code  Family Communication: family was present at bedside, at the time of interview.  The pt provided permission to discuss medical plan with the family. Opportunity was given to ask question and all questions were answered satisfactorily.  3/4, management plan discussed with patient husband at bedside.   Disposition:  Pt is from Home, admitted with UTI, Hypernatremia, AME, still has electrolyte imbalance and urine culture report is pending, which precludes a safe discharge. Discharge to home, when stable most likely tomorrow a.m.  Subjective: No significant events overnight, patient has significant dementia AO x 1 off-and-on, sometimes she does not even know her name.  Sitting only on the bed, no acute distress noticed.  Unable to offer any complaints.  Physical Exam: General: NAD, lying comfortably Appear in no distress, affect appropriate Eyes: PERRLA ENT: Oral Mucosa Clear, moist  Neck: no JVD,  Cardiovascular: S1 and S2 Present,  no Murmur,  Respiratory: good respiratory effort, Bilateral Air entry equal and Decreased, no Crackles, no wheezes Abdomen: Bowel Sound present, Soft and no tenderness,  Skin: no  rashes Extremities: no Pedal edema, no calf tenderness Neurologic: without any new focal findings Gait not checked due to patient safety concerns  Vitals:   05/18/23 2110 05/19/23 0017 05/19/23 0514 05/19/23 0802  BP: 123/67 (!) 96/52 107/67 114/64  Pulse: (!) 106 84 100 81  Resp: 20 18 18 18   Temp: (!) 102 F (38.9 C) 99 F (37.2 C) 98.8 F (37.1 C) 99.7 F (37.6 C)  TempSrc: Oral Oral  Oral  SpO2: 90% 94% 90% 100%  Weight:      Height:        Intake/Output Summary (Last 24 hours) at 05/19/2023 1614 Last data filed at 05/19/2023 4098 Gross per 24 hour  Intake 610 ml  Output --  Net 610 ml   Filed Weights   05/17/23 2141  Weight: 53 kg    Data Reviewed: I have personally reviewed and interpreted daily labs, tele strips, imagings as discussed above. I reviewed all nursing notes, pharmacy notes, vitals, pertinent old records I have discussed plan of care as described above with RN and patient/family.  CBC: Recent Labs  Lab 05/17/23 2147 05/18/23 0444 05/19/23 0427  WBC 18.0* 12.6* 9.7  HGB 12.2 9.4* 9.4*  HCT 38.3 29.6* 28.9*  MCV 93.4 93.7 93.2  PLT 221 172 160   Basic Metabolic Panel: Recent Labs  Lab 05/17/23 2147 05/18/23 0444 05/18/23 0856 05/19/23 0427  NA 153* 148* 147* 143  K 3.3* 3.3*  --  3.3*  CL 114* 116*  --  109  CO2 25 22  --  24  GLUCOSE 132* 138*  --  117*  BUN 48* 41*  --  27*  CREATININE 1.22* 0.89  --  0.78  CALCIUM 10.1 8.6*  --  8.1*  MG  --   --  1.9 1.8  PHOS  --   --  2.3* 6.2*    Studies: No results found.  Scheduled Meds:  aspirin EC  81 mg Oral Daily   donepezil  5 mg Oral QHS   feeding supplement  237 mL Oral BID BM   folic acid  1 mg Oral Daily   heparin  5,000 Units Subcutaneous Q8H   megestrol  400 mg Oral Daily   nitrofurantoin (macrocrystal-monohydrate)  100 mg Oral Q12H   Continuous Infusions:  sodium chloride 75 mL/hr at 05/19/23 1017   PRN Meds: acetaminophen **OR** acetaminophen, albuterol,  senna-docusate  Time spent: 35 minutes  Author: Gillis Santa. MD [facility] 05/19/2023 4:14 PM  To reach On-call, see care teams to locate the attending and reach out to them via [email or web address]. If 7PM-7AM, please contact night-coverage If you still have difficulty reaching the attending provider, please page the [facility] (Director on Call) for [facility] on amion for assistance.

## 2023-05-20 DIAGNOSIS — R627 Adult failure to thrive: Secondary | ICD-10-CM | POA: Diagnosis not present

## 2023-05-20 LAB — URINE CULTURE: Culture: 80000 — AB

## 2023-05-20 LAB — PHOSPHORUS: Phosphorus: 2.9 mg/dL (ref 2.5–4.6)

## 2023-05-20 LAB — BASIC METABOLIC PANEL
Anion gap: 7 (ref 5–15)
BUN: 15 mg/dL (ref 8–23)
CO2: 21 mmol/L — ABNORMAL LOW (ref 22–32)
Calcium: 8.3 mg/dL — ABNORMAL LOW (ref 8.9–10.3)
Chloride: 109 mmol/L (ref 98–111)
Creatinine, Ser: 0.54 mg/dL (ref 0.44–1.00)
GFR, Estimated: >60 mL/min (ref >60)
Glucose, Bld: 103 mg/dL — ABNORMAL HIGH (ref 70–99)
Potassium: 3.2 mmol/L — ABNORMAL LOW (ref 3.5–5.1)
Sodium: 137 mmol/L (ref 135–145)

## 2023-05-20 LAB — CBC
HCT: 26.9 % — ABNORMAL LOW (ref 36.0–46.0)
Hemoglobin: 9 g/dL — ABNORMAL LOW (ref 12.0–15.0)
MCH: 29.2 pg (ref 26.0–34.0)
MCHC: 33.5 g/dL (ref 30.0–36.0)
MCV: 87.3 fL (ref 80.0–100.0)
Platelets: 180 10*3/uL (ref 150–400)
RBC: 3.08 MIL/uL — ABNORMAL LOW (ref 3.87–5.11)
RDW: 13.7 % (ref 11.5–15.5)
WBC: 10.3 10*3/uL (ref 4.0–10.5)
nRBC: 0 % (ref 0.0–0.2)

## 2023-05-20 LAB — MAGNESIUM: Magnesium: 1.5 mg/dL — ABNORMAL LOW (ref 1.7–2.4)

## 2023-05-20 MED ORDER — POTASSIUM CHLORIDE 20 MEQ PO PACK
40.0000 meq | PACK | ORAL | Status: AC
  Administered 2023-05-20: 40 meq via ORAL
  Filled 2023-05-20: qty 2

## 2023-05-20 MED ORDER — MAGNESIUM SULFATE 2 GM/50ML IV SOLN
2.0000 g | Freq: Once | INTRAVENOUS | Status: AC
  Administered 2023-05-20: 2 g via INTRAVENOUS
  Filled 2023-05-20: qty 50

## 2023-05-20 NOTE — TOC Progression Note (Signed)
 Transition of Care [facility]) - Progression Note    Patient Details  Name: Katrina Simon MRN: [id number] Date of Birth: 10-Mar-1951  Transition of Care [facility] CM/SW Contact  Chapman Fitch, RN Phone Number: 05/20/2023, 12:50 PM  Clinical Narrative:      Plan remains for patient to return home with home health services through [facility] at discharge.  Noted patient has been weaned to RA Expected Discharge Plan: Home w Home Health Services Barriers to Discharge: Continued Medical Work up  Expected Discharge Plan and Services     Post Acute Care Choice: Home Health Living arrangements for the past 2 months: Single Family Home                                       Social Determinants of Health (SDOH) Interventions SDOH Screenings   Food Insecurity: No Food Insecurity (05/18/2023)  Housing: Low Risk  (05/18/2023)  Transportation Needs: No Transportation Needs (05/18/2023)  Utilities: Not At Risk (05/18/2023)  Depression (PHQ2-9): Low Risk  (01/05/2023)  Financial Resource Strain: Low Risk  (11/13/2022)   Received from [facility]  Physical Activity: Unknown (11/13/2022)   Received from [facility]  Social Connections: Unknown (05/18/2023)  Stress: Stress Concern Present (11/13/2022)   Received from [facility]  Tobacco Use: Medium Risk (05/18/2023)    Readmission Risk Interventions    05/18/2023   12:24 PM  Readmission Risk Prevention Plan  Transportation Screening Complete  PCP or Specialist Appt within 3-5 Days Complete  HRI or Home Care Consult Complete  Social Work Consult for Recovery Care Planning/Counseling Complete  Palliative Care Screening Complete  Medication Review [employer]) Complete

## 2023-05-20 NOTE — Plan of Care (Signed)
  Problem: Education: Goal: Knowledge of General Education information will improve Description: Including pain rating scale, medication(s)/side effects and non-pharmacologic comfort measures Outcome: Progressing   Problem: Clinical Measurements: Goal: Ability to maintain clinical measurements within normal limits will improve Outcome: Progressing   Problem: Coping: Goal: Level of anxiety will decrease Outcome: Progressing   Problem: Elimination: Goal: Will not experience complications related to bowel motility Outcome: Progressing Goal: Will not experience complications related to urinary retention Outcome: Progressing   Problem: Pain Managment: Goal: General experience of comfort will improve and/or be controlled Outcome: Progressing   Problem: Safety: Goal: Ability to remain free from injury will improve Outcome: Progressing   Problem: Skin Integrity: Goal: Risk for impaired skin integrity will decrease Outcome: Progressing

## 2023-05-20 NOTE — Progress Notes (Signed)
 [facility] Progress Note  Patient: Katrina Simon    ZOX:[id number]  DOA: 05/17/2023     Date of Service: the patient was seen and examined on 05/20/2023  Chief Complaint  Patient presents with   Weakness   Brief hospital course: Katrina Simon is a 73 y.o. female with medical history significant of progressive worsening dementia over the past 1 and half years, COPD/emphysema, history of GERD/short segment of Barrett's esophagus.  Patient lives with her husband.  She is ADL and IADL dependent.  She was brought in by her husband tonight on account of generalized weakness and increased drowsiness.  Per husband who is the primary caretaker, patient has not been eating or drinking well over the past 3 days.  Symptoms were preceded by 3 days of diarrhea.  No reported fever or chills.  No nausea or vomiting.  She was also noted to have progressive weight loss over the past several months after custody battle with daughter.    Assessment and Plan: 83 year old with presumed Alzheimer's dementia of early onset who presents on account of altered mental status from baseline.  Workup thus far revealed hyponatremia and acute kidney injury.  Likely due to dehydration.    UTI UA positive Leukocytosis resolved S/p ceftriaxone, transition to Macrobid 100 mg p.o. twice daily Urine culture growing 80 K Enterobacter faecalis and 20K GNR Follow Ucx sensitivity report and change antibiotics accordingly   Acute metabolic encephalopathy: Multifactorial due to UTI, hyponatremia, AKI and dehydration CT head with no evidence of acute intracranial abnormality.  Atrophic and chronic vessel ischemic changes noted.  Encephalopathy likely vascular dementia may be contributory. Continue supportive care and treat underlying cause   Hypernatremia: Resolved Likely due to poor free water intake due to advanced dementia.   S/p 0.45 saline/D5, sodium level improved, s/p IV fluid to 0.45 saline 75 mL/h, DC'd IV fluid on  3/5, continue oral hydration Na 148--143 --137 Sodium level gradually improved Monitor BMP  Hypokalemia, potassium repleted. Hypomagnesemia, mag repleted. Monitor electrolytes and replete as needed.    Acute kidney injury due to dehydration Creatinine 1.22 on admission Cr 0.78, Creatinine improved after IV fluid Monitor renal functions   Failure to thrive with advanced dementia.   Continue Aricept Started Megace to stimulate appetite Palliative care consulted for goals of care discussion. Patient remained full code     Severe protein calorie malnutrition due to decreased p.o. intake SLP eval done, started dysphagia 1 diet with nectar thick liquids Continue Ensure protein supplements    Body mass index is 18.3 kg/m.  Interventions:  Diet: Dysphagia grade 1 diet with nectar thick liquids DVT Prophylaxis: Subcutaneous Lovenox   Advance goals of care discussion: Full code  Family Communication: family was present at bedside, at the time of interview.  The pt provided permission to discuss medical plan with the family. Opportunity was given to ask question and all questions were answered satisfactorily.  3/5, management plan discussed with patient husband at bedside.   Disposition:  Pt is from Home, admitted with UTI, Hypernatremia, AME, still has electrolyte imbalance and urine culture report is pending, which precludes a safe discharge. Discharge to home, when stable most likely tomorrow a.m.  Subjective: No significant events overnight, patient has significant dementia AAO x 1 just knows her first name. Patient was sitting on the bed comfortably, no acute distress noticed.   Physical Exam: General: NAD, lying comfortably Appear in no distress, affect appropriate Eyes: PERRLA ENT: Oral Mucosa Clear, moist  Neck:  no JVD,  Cardiovascular: S1 and S2 Present, no Murmur,  Respiratory: good respiratory effort, Bilateral Air entry equal and Decreased, no Crackles, no  wheezes Abdomen: Bowel Sound present, Soft and no tenderness,  Skin: no rashes Extremities: no Pedal edema, no calf tenderness Neurologic: without any new focal findings Gait not checked due to patient safety concerns  Vitals:   05/19/23 1635 05/19/23 2117 05/20/23 0351 05/20/23 0812  BP: 118/66 116/66 121/68 116/70  Pulse: 85 85 80 66  Resp: 18 17 20 14   Temp: 98.3 F (36.8 C) 98.4 F (36.9 C) 98.4 F (36.9 C) 98.7 F (37.1 C)  TempSrc:  Axillary  Oral  SpO2: 96% 94% 95% 97%  Weight:      Height:        Intake/Output Summary (Last 24 hours) at 05/20/2023 1121 Last data filed at 05/20/2023 1024 Gross per 24 hour  Intake 1068.2 ml  Output --  Net 1068.2 ml   Filed Weights   05/17/23 2141  Weight: 53 kg    Data Reviewed: I have personally reviewed and interpreted daily labs, tele strips, imagings as discussed above. I reviewed all nursing notes, pharmacy notes, vitals, pertinent old records I have discussed plan of care as described above with RN and patient/family.  CBC: Recent Labs  Lab 05/17/23 2147 05/18/23 0444 05/19/23 0427 05/20/23 0443  WBC 18.0* 12.6* 9.7 10.3  HGB 12.2 9.4* 9.4* 9.0*  HCT 38.3 29.6* 28.9* 26.9*  MCV 93.4 93.7 93.2 87.3  PLT 221 172 160 180   Basic Metabolic Panel: Recent Labs  Lab 05/17/23 2147 05/18/23 0444 05/18/23 0856 05/19/23 0427 05/20/23 0443  NA 153* 148* 147* 143 137  K 3.3* 3.3*  --  3.3* 3.2*  CL 114* 116*  --  109 109  CO2 25 22  --  24 21*  GLUCOSE 132* 138*  --  117* 103*  BUN 48* 41*  --  27* 15  CREATININE 1.22* 0.89  --  0.78 0.54  CALCIUM 10.1 8.6*  --  8.1* 8.3*  MG  --   --  1.9 1.8 1.5*  PHOS  --   --  2.3* 6.2* 2.9    Studies: No results found.  Scheduled Meds:  aspirin EC  81 mg Oral Daily   donepezil  5 mg Oral QHS   enoxaparin (LOVENOX) injection  40 mg Subcutaneous QPM   feeding supplement  237 mL Oral BID BM   folic acid  1 mg Oral Daily   megestrol  400 mg Oral Daily   nitrofurantoin  (macrocrystal-monohydrate)  100 mg Oral Q12H   potassium chloride  40 mEq Oral Q4H   Continuous Infusions:   PRN Meds: acetaminophen **OR** acetaminophen, albuterol, senna-docusate  Time spent: 55 minutes  Author: Gillis Santa. MD [facility] 05/20/2023 11:21 AM  To reach On-call, see care teams to locate the attending and reach out to them via [email or web address]. If 7PM-7AM, please contact night-coverage If you still have difficulty reaching the attending provider, please page the [facility] (Director on Call) for [facility] on amion for assistance.

## 2023-05-21 ENCOUNTER — Other Ambulatory Visit: Payer: Self-pay

## 2023-05-21 DIAGNOSIS — R627 Adult failure to thrive: Secondary | ICD-10-CM | POA: Diagnosis not present

## 2023-05-21 LAB — CBC
HCT: 27.3 % — ABNORMAL LOW (ref 36.0–46.0)
Hemoglobin: 9.2 g/dL — ABNORMAL LOW (ref 12.0–15.0)
MCH: 29.3 pg (ref 26.0–34.0)
MCHC: 33.7 g/dL (ref 30.0–36.0)
MCV: 86.9 fL (ref 80.0–100.0)
Platelets: 209 10*3/uL (ref 150–400)
RBC: 3.14 MIL/uL — ABNORMAL LOW (ref 3.87–5.11)
RDW: 13.9 % (ref 11.5–15.5)
WBC: 7.6 10*3/uL (ref 4.0–10.5)
nRBC: 0 % (ref 0.0–0.2)

## 2023-05-21 LAB — BASIC METABOLIC PANEL
Anion gap: 8 (ref 5–15)
BUN: 16 mg/dL (ref 8–23)
CO2: 23 mmol/L (ref 22–32)
Calcium: 8.8 mg/dL — ABNORMAL LOW (ref 8.9–10.3)
Chloride: 108 mmol/L (ref 98–111)
Creatinine, Ser: 0.6 mg/dL (ref 0.44–1.00)
GFR, Estimated: >60 mL/min (ref >60)
Glucose, Bld: 100 mg/dL — ABNORMAL HIGH (ref 70–99)
Potassium: 4.1 mmol/L (ref 3.5–5.1)
Sodium: 139 mmol/L (ref 135–145)

## 2023-05-21 LAB — PHOSPHORUS: Phosphorus: 3.6 mg/dL (ref 2.5–4.6)

## 2023-05-21 LAB — MAGNESIUM: Magnesium: 2.3 mg/dL (ref 1.7–2.4)

## 2023-05-21 MED ORDER — ACETAMINOPHEN 325 MG PO TABS: 650.0000 mg | ORAL_TABLET | Freq: Four times a day (QID) | ORAL | Status: AC | PRN

## 2023-05-21 MED ORDER — NITROFURANTOIN MONOHYD MACRO 100 MG PO CAPS
100.0000 mg | ORAL_CAPSULE | Freq: Two times a day (BID) | ORAL | 0 refills | Status: AC
  Filled 2023-05-21: qty 6, 3d supply, fill #0

## 2023-05-21 NOTE — Plan of Care (Signed)
  Problem: Clinical Measurements: Goal: Ability to maintain clinical measurements within normal limits will improve Outcome: Progressing   Problem: Coping: Goal: Level of anxiety will decrease Outcome: Progressing   Problem: Elimination: Goal: Will not experience complications related to urinary retention Outcome: Progressing   Problem: Pain Managment: Goal: General experience of comfort will improve and/or be controlled Outcome: Progressing   Problem: Safety: Goal: Ability to remain free from injury will improve Outcome: Progressing   Problem: Skin Integrity: Goal: Risk for impaired skin integrity will decrease Outcome: Progressing

## 2023-05-21 NOTE — TOC Transition Note (Signed)
 Transition of Care [facility]) - Discharge Note   Patient Details  Name: Katrina Simon MRN: [id number] Date of Birth: [birth year]  [facility] CM/SW Contact:  Chapman Fitch, RN Phone Number: 05/21/2023, 10:24 AM   Clinical Narrative:     Patient to discharge today Patient still on RA Notified Shaun at [facility] of discharge Requested MD to order home health PT and OT    Barriers to Discharge: Continued Medical Work up   Patient Goals and CMS Choice            Discharge Placement                       Discharge Plan and Services Additional resources added to the After Visit Summary for       Post Acute Care Choice: Home Health                               Social Drivers of Health (SDOH) Interventions SDOH Screenings   Food Insecurity: No Food Insecurity (05/18/2023)  Housing: Low Risk  (05/18/2023)  Transportation Needs: No Transportation Needs (05/18/2023)  Utilities: Not At Risk (05/18/2023)  Depression (PHQ2-9): Low Risk  (01/05/2023)  Financial Resource Strain: Low Risk  (11/13/2022)   Received from [facility]  Physical Activity: Unknown (11/13/2022)   Received from [facility]  Social Connections: Unknown (05/18/2023)  Stress: Stress Concern Present (11/13/2022)   Received from [facility]  Tobacco Use: Medium Risk (05/18/2023)     Readmission Risk Interventions    05/18/2023   12:24 PM  Readmission Risk Prevention Plan  Transportation Screening Complete  PCP or Specialist Appt within 3-5 Days Complete  HRI or Home Care Consult Complete  Social Work Consult for Recovery Care Planning/Counseling Complete  Palliative Care Screening Complete  Medication Review [employer]) Complete

## 2023-05-21 NOTE — Care Management Important Message (Signed)
 Important Message  Patient Details  Name: Katrina Simon MRN: [id number] Date of Birth: 08-03-1950   Important Message Given:  Yes - Medicare IM     Destina Mantei W, CMA 05/21/2023, 10:40 AM

## 2023-05-21 NOTE — Progress Notes (Signed)
 Discharge instructions were reviewed with patient and caregiver. Questions were encouraged and answered. IV was removed. Belongings collected by family

## 2023-05-21 NOTE — Discharge Summary (Signed)
 [facility] Discharge Summary   Patient: Katrina Simon GNF:[id number]  PCP: System, Provider Not In  Date of admission: 05/17/2023   Date of discharge:  05/21/2023     Discharge Diagnoses:  Principal Problem:   Failure to thrive in adult   Admitted From: Home Disposition:  Home with [facility]   Recommendations for Outpatient Follow-up:  F/u PCP in 1-2 wks and repeat BMP in 1-2 weeks F/u for Lung cancer screen with PCP Follow up LABS/TEST:  as above   Follow-up Information     PCP Follow up in 1 week(s).                 Diet recommendation: Dysphagia type 1 nectar thick liquid  Activity: The patient is advised to gradually reintroduce usual activities, as tolerated  Discharge Condition: stable  Code Status: Full code   History of present illness: As per the H and P dictated on admission Hospital Course:  Katrina Simon is a 73 y.o. female with medical history significant of progressive worsening dementia over the past 1 and half years, COPD/emphysema, history of GERD/short segment of Barrett's esophagus.  Patient lives with her husband.  She is ADL and IADL dependent.  She was brought in by her husband tonight on account of generalized weakness and increased drowsiness.  Per husband who is the primary caretaker, patient has not been eating or drinking well over the past 3 days.  Symptoms were preceded by 3 days of diarrhea.  No reported fever or chills.  No nausea or vomiting.  She was also noted to have progressive weight loss over the past several months after custody battle with daughter.      Assessment and Plan:  # UTI, UA positive Leukocytosis resolved. S/p ceftriaxone, transition to Macrobid 100 mg p.o. twice daily. Urine culture growing 80 K Enterobacter faecalis and 20K GNR Ucx grew Enterococcus faecalis 80K and Raoutella Planticola 20K, both sensitive to nitrofurantoin.  Patient was discharged on Macrobid 100 mg p.o. twice daily for 3 days.  # Acute metabolic  encephalopathy: Multifactorial due to UTI, hyponatremia, AKI and dehydration. CT head with no evidence of acute intracranial abnormality.  Atrophic and chronic vessel ischemic changes noted.  Encephalopathy likely vascular dementia may be contributory. Continue supportive care and treat underlying cause  Hypernatremia: Resolved Likely due to poor free water intake due to advanced dementia.   S/p 0.45 saline/D5, sodium level improved, s/p IV fluid to 0.45 saline 75 mL/h, DC'd IV fluid on 3/5, continue oral hydration. Na 148--143 --137 # Hypokalemia, potassium repleted.  Resolved # Hypomagnesemia, mag repleted.  Resolved # Acute kidney injury due to dehydration. Creatinine 1.22 on admission Cr 0.78, Creatinine improved after IV fluid.  # Failure to thrive with advanced dementia. Continue Aricept. S/p Megace to stimulate appetite.Palliative care consulted for goals of care discussion. Patient remained full code   # Severe protein calorie malnutrition due to decreased p.o. intake SLP eval done, started dysphagia 1 diet with nectar thick liquids Continue Ensure protein supplements   Body mass index is 18.3 kg/m.  Nutrition Interventions:  Patient was seen by physical therapy, who recommended Home health, which was arranged. On the day of the discharge the patient's vitals were stable, and no other acute medical condition were reported by patient. the patient was felt safe to be discharge at Home with Home health.  Consultants: Palliative care Procedures: None  Discharge Exam: General: Appear in no distress, no Rash; Oral Mucosa Clear, moist. Cardiovascular: S1 and  S2 Present, no Murmur, Respiratory: normal respiratory effort, Bilateral Air entry present and no Crackles, no wheezes Abdomen: Bowel Sound present, Soft and no tenderness, no hernia Extremities: no Pedal edema, no calf tenderness Neurology: AAOx 1, knows only her first name.  No focal deficits. affect appropriate.  Filed  Weights   05/17/23 2141  Weight: 53 kg   Vitals:   05/21/23 0454 05/21/23 0753  BP: 122/77 107/69  Pulse: 77 68  Resp: 20 16  Temp: 98.4 F (36.9 C) 97.9 F (36.6 C)  SpO2: 97% 95%    DISCHARGE MEDICATION: Allergies as of 05/21/2023       Reactions   Penicillins Hives, Rash   Other Reaction(s): Other (See Comments)   Statins Other (See Comments)   Myalgia        Medication List     STOP taking these medications    Benefiber Prebiotic+Probiotic Chew   cyanocobalamin 1000 MCG tablet Commonly known as: VITAMIN B12   mirtazapine 15 MG tablet Commonly known as: REMERON   mirtazapine 7.5 MG tablet Commonly known as: REMERON   solifenacin 10 MG tablet Commonly known as: VESICARE   solifenacin 5 MG tablet Commonly known as: VESICARE       TAKE these medications    acetaminophen 325 MG tablet Commonly known as: TYLENOL Take 2 tablets (650 mg total) by mouth every 6 (six) hours as needed for mild pain (pain score 1-3), moderate pain (pain score 4-6), fever or headache (or Fever >/= 101).   aspirin EC 81 MG tablet Take 81 mg by mouth daily. Swallow whole.   Cholecalciferol 50 MCG (2000 UT) Tabs Take 2,000 Units by mouth at bedtime.   diclofenac Sodium 1 % Gel Commonly known as: Voltaren Apply 4 g topically 4 (four) times daily. What changed:  when to take this reasons to take this   folic acid 400 MCG tablet Commonly known as: FOLVITE Take 400 mcg by mouth at bedtime.   ipratropium-albuterol 0.5-2.5 (3) MG/3ML Soln Commonly known as: DUONEB Inhale 3 mLs into the lungs every 6 (six) hours as needed.   lidocaine 5 % Commonly known as: LIDODERM Place 1 patch onto the skin daily as needed.   naloxone 4 MG/0.1ML Liqd nasal spray kit Commonly known as: NARCAN As directed for opioid induced respiratory depression   nitrofurantoin (macrocrystal-monohydrate) 100 MG capsule Commonly known as: MACROBID Take 1 capsule (100 mg total) by mouth every 12  (twelve) hours for 3 days.   Oxycodone HCl 10 MG Tabs Take 1 tablet (10 mg total) by mouth 3 (three) times daily. What changed: Another medication with the same name was removed. Continue taking this medication, and follow the directions you see here.   pantoprazole 40 MG tablet Commonly known as: PROTONIX Take by mouth.   rivastigmine 1.5 MG capsule Commonly known as: EXELON Take 1.5 mg by mouth 2 (two) times daily.   sertraline 100 MG tablet Commonly known as: ZOLOFT Take 100 mg by mouth at bedtime.   Trelegy Ellipta 200-62.5-25 MCG/ACT Aepb Generic drug: Fluticasone-Umeclidin-Vilant Inhale 1 puff into the lungs daily.       Allergies  Allergen Reactions   Penicillins Hives and Rash    Other Reaction(s): Other (See Comments)   Statins Other (See Comments)    Myalgia   Discharge Instructions     Call MD for:  difficulty breathing, headache or visual disturbances   Complete by: As directed    Call MD for:  extreme fatigue   Complete  by: As directed    Call MD for:  persistant dizziness or light-headedness   Complete by: As directed    Call MD for:  persistant nausea and vomiting   Complete by: As directed    Call MD for:  severe uncontrolled pain   Complete by: As directed    Call MD for:  temperature >100.4   Complete by: As directed    Diet - low sodium heart healthy   Complete by: As directed    Discharge instructions   Complete by: As directed    F/u PCP in 1-2 wks and repeat BMP in 1-2 weeks F/u for Lung cancer screen with PCP   Increase activity slowly   Complete by: As directed        The results of significant diagnostics from this hospitalization (including imaging, microbiology, ancillary and laboratory) are listed below for reference.    Significant Diagnostic Studies: DG Chest Port 1 View Result Date: 05/17/2023 CLINICAL DATA:  Weakness. EXAM: PORTABLE CHEST 1 VIEW COMPARISON:  June 20, 2021 FINDINGS: The heart size and mediastinal contours are  within normal limits. The lungs are hyperinflated with predominantly stable areas of scarring and/or atelectasis is seen within the bilateral upper lobes. Mild left basilar atelectasis and/or infiltrate is noted. No pleural effusion or pneumothorax is identified. The visualized skeletal structures are unremarkable. IMPRESSION: 1. Chronic appearing bilateral upper lobe scarring and/or atelectasis. 2. Mild left basilar atelectasis and/or infiltrate. Electronically Signed   By: Aram Candela M.D.   On: 05/17/2023 22:12   CT Head Wo Contrast Result Date: 05/17/2023 CLINICAL DATA:  Weakness diarrhea EXAM: CT HEAD WITHOUT CONTRAST TECHNIQUE: Contiguous axial images were obtained from the base of the skull through the vertex without intravenous contrast. RADIATION DOSE REDUCTION: This exam was performed according to the departmental dose-optimization program which includes automated exposure control, adjustment of the mA and/or kV according to patient size and/or use of iterative reconstruction technique. COMPARISON:  CT brain 07/13/2022 FINDINGS: Brain: No acute territorial infarction, hemorrhage or intracranial mass. Atrophy. Moderate chronic small vessel ischemic changes of the white matter. Stable ventricle size and morphology Vascular: No hyperdense vessels.  Carotid vascular calcification Skull: Normal. Negative for fracture or focal lesion. Sinuses/Orbits: No acute finding. Other: None IMPRESSION: 1. No CT evidence for acute intracranial abnormality. 2. Atrophy and chronic small vessel ischemic changes of the white matter Electronically Signed   By: Jasmine Pang M.D.   On: 05/17/2023 22:11   CT CHEST LUNG CA SCREEN LOW DOSE W/O CM Result Date: 05/11/2023 CLINICAL DATA:  73 year old female former smoker with 51 pack-year smoking history, quit smoking 2024 EXAM: CT CHEST WITHOUT CONTRAST LOW-DOSE FOR LUNG CANCER SCREENING TECHNIQUE: Multidetector CT imaging of the chest was performed following the standard  protocol without IV contrast. RADIATION DOSE REDUCTION: This exam was performed according to the departmental dose-optimization program which includes automated exposure control, adjustment of the mA and/or kV according to patient size and/or use of iterative reconstruction technique. COMPARISON:  02/25/2021 screening chest CT FINDINGS: Cardiovascular: Normal heart size. No significant pericardial effusion/thickening. Left anterior descending and right coronary atherosclerosis. Atherosclerotic nonaneurysmal thoracic aorta. Normal caliber pulmonary arteries. Mediastinum/Nodes: No significant thyroid nodules. Unremarkable esophagus. No pathologically enlarged axillary, mediastinal or hilar lymph nodes, noting limited sensitivity for the detection of hilar adenopathy on this noncontrast study. Lungs/Pleura: No pneumothorax. No pleural effusion. Mild paraseptal and centrilobular emphysema with diffuse bronchial wall thickening. No acute consolidative airspace disease or lung masses. No significant pulmonary nodules. Moderate  patchy confluent subpleural reticulation with associated mild traction bronchiolectasis, architectural distortion in both lungs without a clear apicobasilar gradient, worsened in the interval and with new mild honeycombing in the posterior right lower lobe. Upper abdomen: Small hiatal hernia. Musculoskeletal: No aggressive appearing focal osseous lesions. Mild thoracic spondylosis. IMPRESSION: 1. Lung-RADS 1, negative. Continue annual screening with low-dose chest CT without contrast in 12 months. 2. Interval worsening of spectrum of pulmonary parenchymal findings suggestive of moderate fibrotic interstitial lung disease with new mild honeycombing, worrisome for UIP. Suggest pulmonology consultation and follow-up high-resolution chest CT. 3. Two-vessel coronary atherosclerosis. 4. Small hiatal hernia. 5. Aortic Atherosclerosis (ICD10-I70.0) and Emphysema (ICD10-J43.9). Electronically Signed   By:  Delbert Phenix M.D.   On: 05/11/2023 18:04    Microbiology: Recent Results (from the past 240 hours)  Resp panel by RT-PCR (RSV, Flu A&B, Covid) Anterior Nasal Swab     Status: None   Collection Time: 05/17/23 10:31 PM   Specimen: Anterior Nasal Swab  Result Value Ref Range Status   SARS Coronavirus 2 by RT PCR NEGATIVE NEGATIVE Final    Comment: (NOTE) SARS-CoV-2 target nucleic acids are NOT DETECTED.  The SARS-CoV-2 RNA is generally detectable in upper respiratory specimens during the acute phase of infection. The lowest concentration of SARS-CoV-2 viral copies this assay can detect is 138 copies/mL. A negative result does not preclude SARS-Cov-2 infection and should not be used as the sole basis for treatment or other patient management decisions. A negative result may occur with  improper specimen collection/handling, submission of specimen other than nasopharyngeal swab, presence of viral mutation(s) within the areas targeted by this assay, and inadequate number of viral copies(<138 copies/mL). A negative result must be combined with clinical observations, patient history, and epidemiological information. The expected result is Negative.  Fact Sheet for Patients:  BloggerCourse.com  Fact Sheet for Healthcare Providers:  SeriousBroker.it  This test is no t yet approved or cleared by the [place] FDA and  has been authorized for detection and/or diagnosis of SARS-CoV-2 by FDA under an Emergency Use Authorization (EUA). This EUA will remain  in effect (meaning this test can be used) for the duration of the COVID-19 declaration under Section 564(b)(1) of the Act, 21 U.S.C.section 360bbb-3(b)(1), unless the authorization is terminated  or revoked sooner.       Influenza A by PCR NEGATIVE NEGATIVE Final   Influenza B by PCR NEGATIVE NEGATIVE Final    Comment: (NOTE) The Xpert Xpress SARS-CoV-2/FLU/RSV plus assay is  intended as an aid in the diagnosis of influenza from Nasopharyngeal swab specimens and should not be used as a sole basis for treatment. Nasal washings and aspirates are unacceptable for Xpert Xpress SARS-CoV-2/FLU/RSV testing.  Fact Sheet for Patients: BloggerCourse.com  Fact Sheet for Healthcare Providers: SeriousBroker.it  This test is not yet approved or cleared by the [place] FDA and has been authorized for detection and/or diagnosis of SARS-CoV-2 by FDA under an Emergency Use Authorization (EUA). This EUA will remain in effect (meaning this test can be used) for the duration of the COVID-19 declaration under Section 564(b)(1) of the Act, 21 U.S.C. section 360bbb-3(b)(1), unless the authorization is terminated or revoked.     Resp Syncytial Virus by PCR NEGATIVE NEGATIVE Final    Comment: (NOTE) Fact Sheet for Patients: BloggerCourse.com  Fact Sheet for Healthcare Providers: SeriousBroker.it  This test is not yet approved or cleared by the [place] FDA and has been authorized for detection and/or diagnosis of SARS-CoV-2 by FDA  under an Emergency Use Authorization (EUA). This EUA will remain in effect (meaning this test can be used) for the duration of the COVID-19 declaration under Section 564(b)(1) of the Act, 21 U.S.C. section 360bbb-3(b)(1), unless the authorization is terminated or revoked.  Performed at [facility], [address] [place], [place] [address]   Urine Culture     Status: Abnormal   Collection Time: 05/17/23 11:15 PM   Specimen: In/Out Cath Urine  Result Value Ref Range Status   Specimen Description   Final    IN/OUT CATH URINE Performed at [facility], [address] [place], [place] [address]    Special Requests   Final    NONE Performed at [facility], [address] [place], [place] [address]     Culture (A)  Final    80,000 COLONIES/mL ENTEROCOCCUS FAECALIS 20,000 COLONIES/mL RAOULTELLA PLANTICOLA    Report Status 05/20/2023 FINAL  Final   Organism ID, Bacteria ENTEROCOCCUS FAECALIS (A)  Final   Organism ID, Bacteria RAOULTELLA PLANTICOLA (A)  Final      Susceptibility   Enterococcus faecalis - MIC*    AMPICILLIN <=2 SENSITIVE Sensitive     NITROFURANTOIN <=16 SENSITIVE Sensitive     VANCOMYCIN 1 SENSITIVE Sensitive     * 80,000 COLONIES/mL ENTEROCOCCUS FAECALIS   Raoultella planticola - MIC*    AMPICILLIN >=32 RESISTANT Resistant     CEFEPIME 4 INTERMEDIATE Intermediate     CEFTRIAXONE 1 SENSITIVE Sensitive     CIPROFLOXACIN <=0.25 SENSITIVE Sensitive     GENTAMICIN <=1 SENSITIVE Sensitive     IMIPENEM 2 SENSITIVE Sensitive     NITROFURANTOIN 32 SENSITIVE Sensitive     TRIMETH/SULFA <=20 SENSITIVE Sensitive     AMPICILLIN/SULBACTAM 4 SENSITIVE Sensitive     PIP/TAZO <=4 SENSITIVE Sensitive ug/mL    * 20,000 COLONIES/mL RAOULTELLA PLANTICOLA     Labs: CBC: Recent Labs  Lab 05/17/23 2147 05/18/23 0444 05/19/23 0427 05/20/23 0443 05/21/23 0514  WBC 18.0* 12.6* 9.7 10.3 7.6  HGB 12.2 9.4* 9.4* 9.0* 9.2*  HCT 38.3 29.6* 28.9* 26.9* 27.3*  MCV 93.4 93.7 93.2 87.3 86.9  PLT 221 172 160 180 209   Basic Metabolic Panel: Recent Labs  Lab 05/17/23 2147 05/18/23 0444 05/18/23 0856 05/19/23 0427 05/20/23 0443 05/21/23 0514  NA 153* 148* 147* 143 137 139  K 3.3* 3.3*  --  3.3* 3.2* 4.1  CL 114* 116*  --  109 109 108  CO2 25 22  --  24 21* 23  GLUCOSE 132* 138*  --  117* 103* 100*  BUN 48* 41*  --  27* 15 16  CREATININE 1.22* 0.89  --  0.78 0.54 0.60  CALCIUM 10.1 8.6*  --  8.1* 8.3* 8.8*  MG  --   --  1.9 1.8 1.5* 2.3  PHOS  --   --  2.3* 6.2* 2.9 3.6   Liver Function Tests: No results for input(s): "AST", "ALT", "ALKPHOS", "BILITOT", "PROT", "ALBUMIN" in the last 168 hours. No results for input(s): "LIPASE", "AMYLASE" in the last 168 hours. No results for  input(s): "AMMONIA" in the last 168 hours. Cardiac Enzymes: No results for input(s): "CKTOTAL", "CKMB", "CKMBINDEX", "TROPONINI" in the last 168 hours. BNP (last 3 results) No results for input(s): "BNP" in the last 8760 hours. CBG: No results for input(s): "GLUCAP" in the last 168 hours.  Time spent: 35 minutes  Signed:  Gillis Santa  [facility] 05/21/2023 10:04 AM

## 2023-06-04 IMAGING — MG MM DIGITAL SCREENING BILAT W/ TOMO AND CAD
8 series · 9 of 24 positions shown · non-contrast
Comparison: Previous exam(s).

CLINICAL DATA: Screening.

EXAM:
DIGITAL SCREENING BILATERAL MAMMOGRAM WITH TOMOSYNTHESIS AND CAD
TECHNIQUE: Bilateral screening digital craniocaudal and mediolateral oblique
mammograms were obtained. Bilateral screening digital breast
tomosynthesis was performed. The images were evaluated with
computer-aided detection.

[R CC synth-2D]
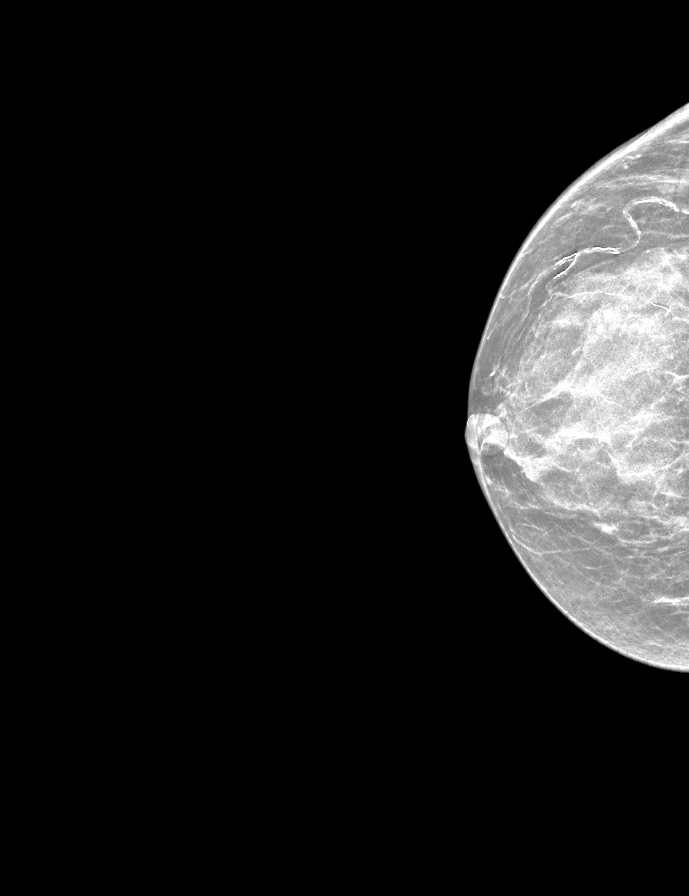

[L MLO synth-2D]
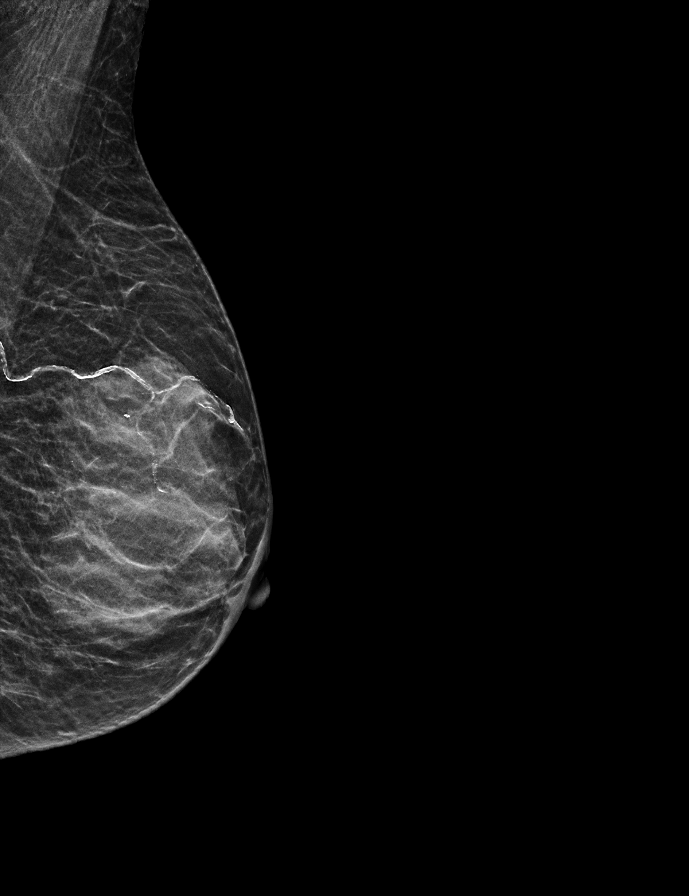

[L CC synth-2D]
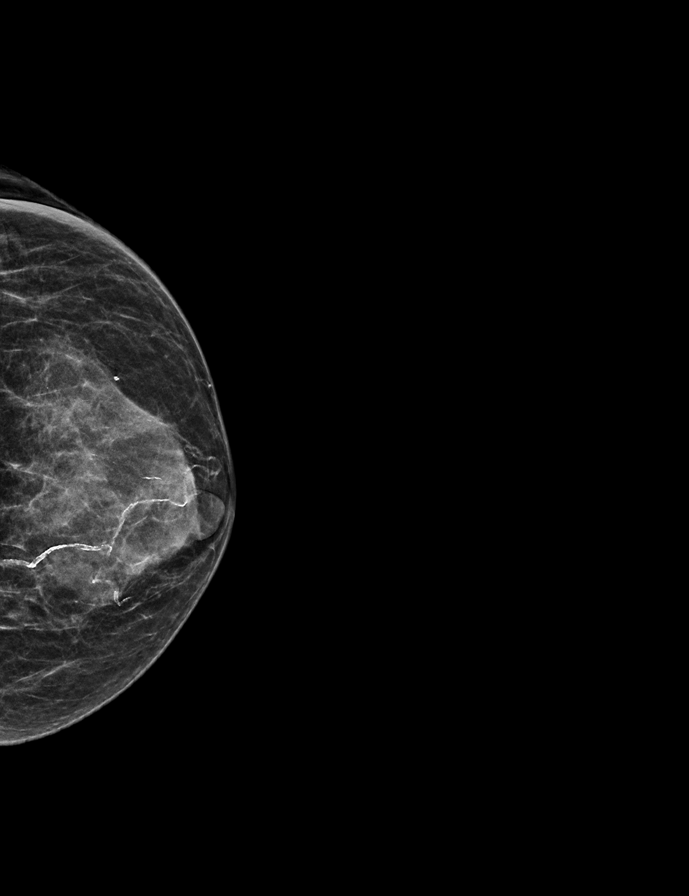

[R MLO synth-2D]
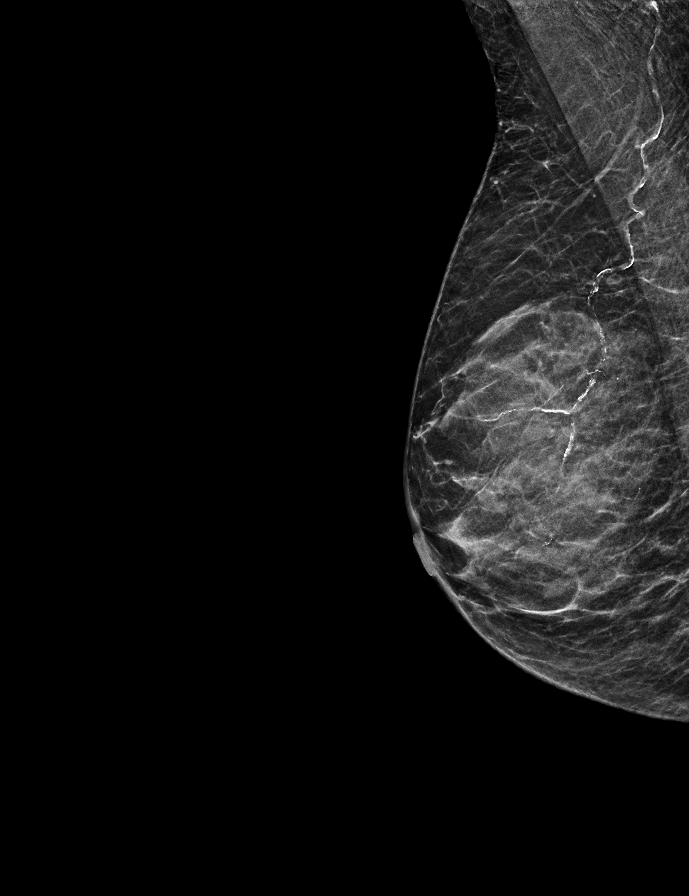

[L CC tomo · 2 of 32 frames shown]
[frame 11/32]
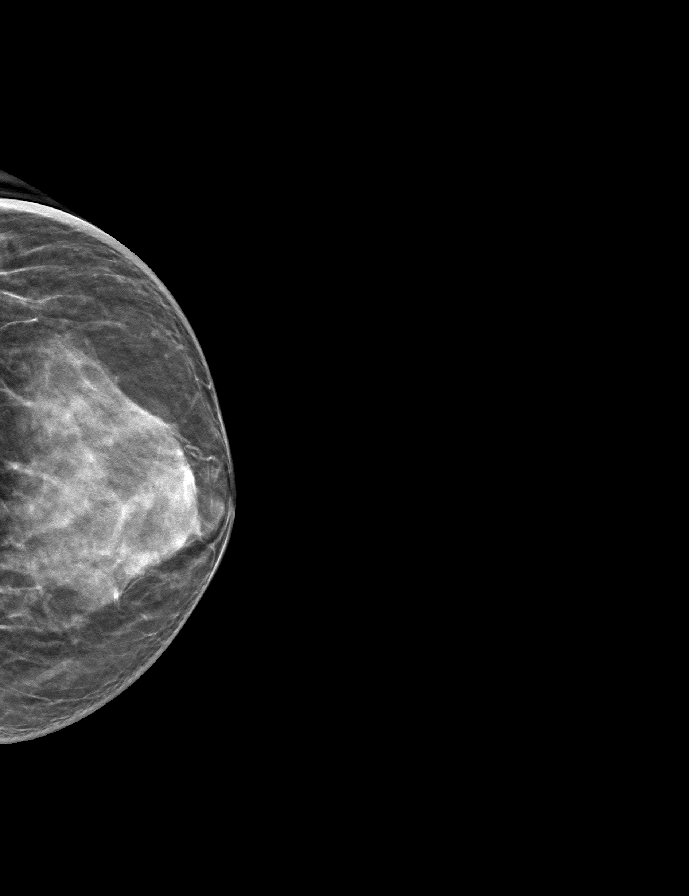
[frame 17/32]
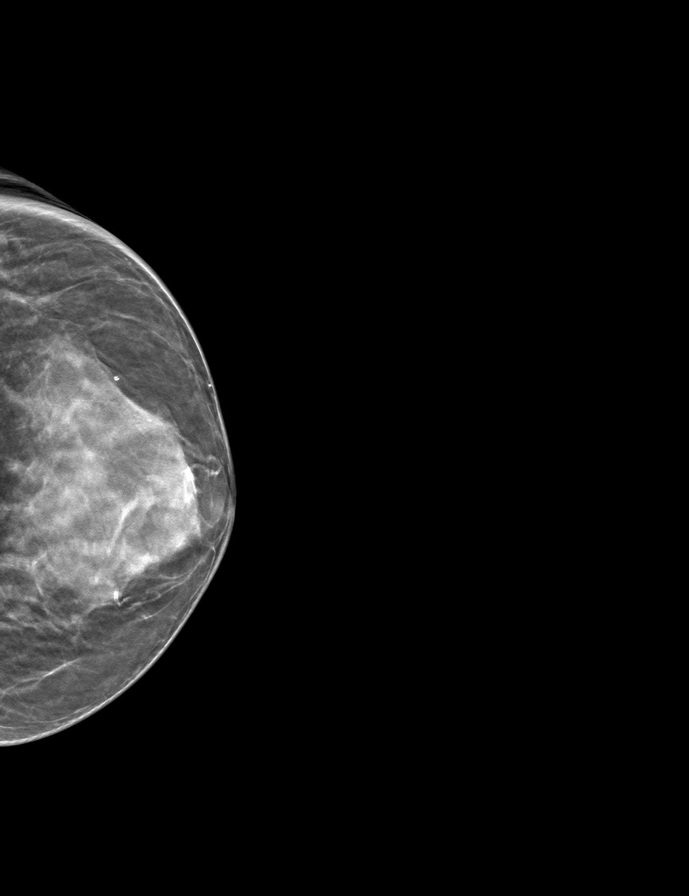

[R MLO tomo · tomo slice 15/30.0]
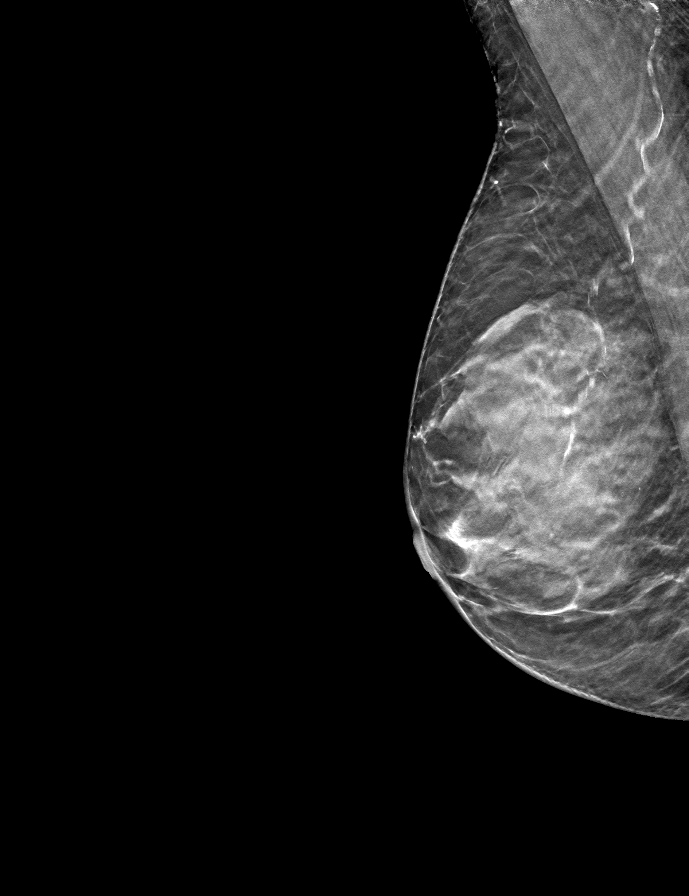

[L MLO tomo · tomo slice 16/31.0]
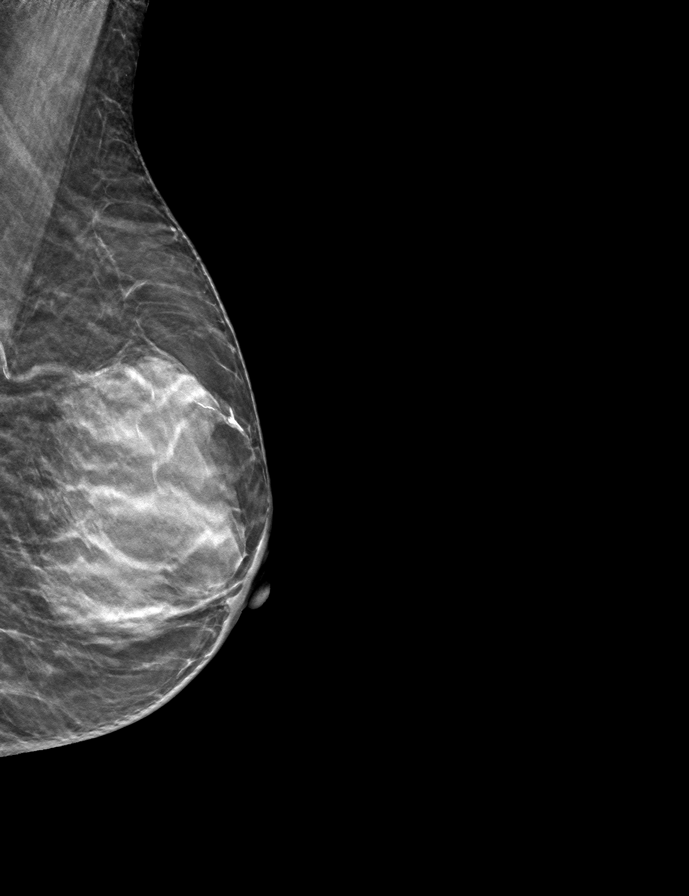

[R CC tomo · tomo slice 16/31.0]
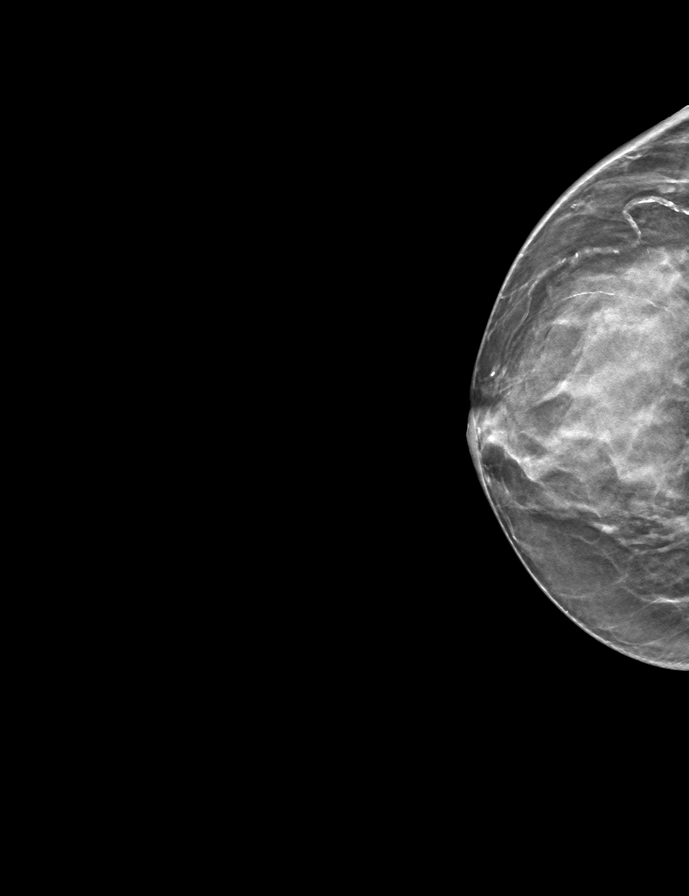

[9 of 24 positions shown; findings below may reference images not displayed]

ACR Breast Density Category c: The breast tissue is heterogeneously
dense, which may obscure small masses.
FINDINGS: There are no findings suspicious for malignancy.
IMPRESSION: No mammographic evidence of malignancy. A result letter of this
screening mammogram will be mailed directly to the patient.

RECOMMENDATION:
Screening mammogram in one year. (Code:Q3-W-BC3)

BI-RADS CATEGORY  1: Negative.

## 2023-06-26 ENCOUNTER — Other Ambulatory Visit: Payer: Self-pay | Admitting: Internal Medicine

## 2023-06-26 DIAGNOSIS — I2510 Atherosclerotic heart disease of native coronary artery without angina pectoris: Secondary | ICD-10-CM

## 2023-06-30 ENCOUNTER — Ambulatory Visit: Admitting: Anesthesiology

## 2023-07-01 ENCOUNTER — Ambulatory Visit: Attending: Anesthesiology | Admitting: Anesthesiology

## 2023-07-01 ENCOUNTER — Encounter: Payer: Self-pay | Admitting: Anesthesiology

## 2023-07-01 DIAGNOSIS — N1831 Chronic kidney disease, stage 3a: Secondary | ICD-10-CM

## 2023-07-01 DIAGNOSIS — M150 Primary generalized (osteo)arthritis: Secondary | ICD-10-CM

## 2023-07-01 DIAGNOSIS — G894 Chronic pain syndrome: Secondary | ICD-10-CM

## 2023-07-01 DIAGNOSIS — M797 Fibromyalgia: Secondary | ICD-10-CM

## 2023-07-01 DIAGNOSIS — F1190 Opioid use, unspecified, uncomplicated: Secondary | ICD-10-CM

## 2023-07-01 DIAGNOSIS — F0150 Vascular dementia without behavioral disturbance: Secondary | ICD-10-CM

## 2023-07-01 DIAGNOSIS — F0280 Dementia in other diseases classified elsewhere without behavioral disturbance: Secondary | ICD-10-CM

## 2023-07-01 DIAGNOSIS — M170 Bilateral primary osteoarthritis of knee: Secondary | ICD-10-CM

## 2023-07-01 DIAGNOSIS — M47816 Spondylosis without myelopathy or radiculopathy, lumbar region: Secondary | ICD-10-CM

## 2023-07-01 DIAGNOSIS — M5135 Other intervertebral disc degeneration, thoracolumbar region: Secondary | ICD-10-CM

## 2023-07-01 MED ORDER — OXYCODONE HCL 10 MG PO TABS: 10.0000 mg | ORAL_TABLET | Freq: Three times a day (TID) | ORAL | 0 refills | Status: DC

## 2023-07-01 NOTE — Progress Notes (Signed)
 I was unable to get in touch with Katrina Simon for her virtual appointment.  We last saw her 2 months ago.  I have reviewed the [place]  practitioner database information and will refill for April 18 for her oxycodone 10 mg tablets 3 times a day.  No other changes are initiated with return to clinic scheduled in 1 month

## 2023-07-06 ENCOUNTER — Encounter
Admission: RE | Admit: 2023-07-06 | Discharge: 2023-07-06 | Disposition: A | Discharge status: Home / Self Care | Source: Ambulatory Visit | Attending: Internal Medicine | Admitting: Internal Medicine

## 2023-07-06 DIAGNOSIS — I2510 Atherosclerotic heart disease of native coronary artery without angina pectoris: Secondary | ICD-10-CM | POA: Diagnosis present

## 2023-07-06 MED ORDER — REGADENOSON 0.4 MG/5ML IV SOLN
0.4000 mg | Freq: Once | INTRAVENOUS | Status: AC
  Administered 2023-07-06: 0.4 mg via INTRAVENOUS

## 2023-07-06 MED ORDER — TECHNETIUM TC 99M TETROFOSMIN IV KIT
10.7300 | PACK | Freq: Once | INTRAVENOUS | Status: AC | PRN
  Administered 2023-07-06: 10.73 via INTRAVENOUS

## 2023-07-06 MED ORDER — TECHNETIUM TC 99M TETROFOSMIN IV KIT
30.7800 | PACK | Freq: Once | INTRAVENOUS | Status: AC | PRN
  Administered 2023-07-06: 30.78 via INTRAVENOUS

## 2023-07-28 ENCOUNTER — Telehealth: Admitting: Anesthesiology

## 2023-07-29 ENCOUNTER — Encounter: Payer: Self-pay | Admitting: Anesthesiology

## 2023-07-29 ENCOUNTER — Ambulatory Visit: Attending: Anesthesiology | Admitting: Anesthesiology

## 2023-07-29 DIAGNOSIS — G894 Chronic pain syndrome: Secondary | ICD-10-CM | POA: Diagnosis not present

## 2023-07-29 DIAGNOSIS — F0150 Vascular dementia without behavioral disturbance: Secondary | ICD-10-CM

## 2023-07-29 DIAGNOSIS — F1190 Opioid use, unspecified, uncomplicated: Secondary | ICD-10-CM

## 2023-07-29 DIAGNOSIS — M170 Bilateral primary osteoarthritis of knee: Secondary | ICD-10-CM | POA: Diagnosis not present

## 2023-07-29 DIAGNOSIS — M47816 Spondylosis without myelopathy or radiculopathy, lumbar region: Secondary | ICD-10-CM | POA: Diagnosis not present

## 2023-07-29 DIAGNOSIS — M5135 Other intervertebral disc degeneration, thoracolumbar region: Secondary | ICD-10-CM

## 2023-07-29 DIAGNOSIS — F0280 Dementia in other diseases classified elsewhere without behavioral disturbance: Secondary | ICD-10-CM

## 2023-07-29 DIAGNOSIS — M5136 Other intervertebral disc degeneration, lumbar region with discogenic back pain only: Secondary | ICD-10-CM

## 2023-07-29 DIAGNOSIS — Z79891 Long term (current) use of opiate analgesic: Secondary | ICD-10-CM

## 2023-07-29 DIAGNOSIS — G309 Alzheimer's disease, unspecified: Secondary | ICD-10-CM

## 2023-07-29 DIAGNOSIS — M150 Primary generalized (osteo)arthritis: Secondary | ICD-10-CM

## 2023-07-29 DIAGNOSIS — M797 Fibromyalgia: Secondary | ICD-10-CM

## 2023-07-29 DIAGNOSIS — N1831 Chronic kidney disease, stage 3a: Secondary | ICD-10-CM

## 2023-07-29 MED ORDER — OXYCODONE HCL 10 MG PO TABS: 10.0000 mg | ORAL_TABLET | Freq: Three times a day (TID) | ORAL | 0 refills | Status: AC

## 2023-07-29 MED ORDER — NALOXONE HCL 4 MG/0.1ML NA LIQD: NASAL | 1 refills | Status: AC

## 2023-07-29 MED ORDER — OXYCODONE HCL 10 MG PO TABS: 10.0000 mg | ORAL_TABLET | Freq: Three times a day (TID) | ORAL | 0 refills | Status: DC

## 2023-07-29 NOTE — Progress Notes (Signed)
 Virtual Visit via Telephone Note  I connected with Katrina Simon on 07/29/23 at  2:20 PM EDT by telephone and verified that I am speaking with the correct person using two identifiers.  Location: Patient: Home Provider: [facility]   I discussed the limitations, risks, security and privacy concerns of performing an evaluation and management service by telephone and the availability of in person appointments. I also discussed with the patient that there may be a patient responsible charge related to this service. The patient expressed understanding and agreed to proceed.   History of Present Illness: I spoke with Katrina Simon, husband of Katrina Simon.  Katrina Simon is a patient of mine with a known history of chronic low back pain hip pain and intermittent posterior lateral leg pain requiring chronic opioid therapy.  She also has a history of mixed presentation dementia that has been gradually worsening over the past few years but Katrina Simon feels this is stabilized to some extent over the past 3 to 6 months with diminished deterioration.  He still complains of low back pain described as a gnawing aching pain that is been stable in nature with no recent changes noted in description and no changes in lower extremity strength function or bowel or bladder function.  She has failed more conservative therapy and despite the ongoing dementia continues to exhibit symptoms of low back pain that are improved with opioid therapy.  She takes 10 mg of oxycodone  3 times a day as administered by her family and this continues to keep her pain under reasonable control with evidence of diminished pain and improved mobility by their assessment.  She also sleeps better with the medication management and without opioid therapy is noted to be in significantly more grimacing type pain and immobile and apparently uncomfortable.  No problems with bowel or bladder function are noted with therapy.  Review of systems: General:  No fevers or chills Pulmonary: No shortness of breath or dyspnea Cardiac: No angina or palpitations or lightheadedness GI: No abdominal pain or constipation Psych: No depression  .  Observations/Objective:  Current Outpatient Medications:    [START ON 09/01/2023] Oxycodone  HCl 10 MG TABS, Take 1 tablet (10 mg total) by mouth 3 (three) times daily., Disp: 90 tablet, Rfl: 0   acetaminophen  (TYLENOL ) 325 MG tablet, Take 2 tablets (650 mg total) by mouth every 6 (six) hours as needed for mild pain (pain score 1-3), moderate pain (pain score 4-6), fever or headache (or Fever >/= 101)., Disp: , Rfl:    aspirin  EC 81 MG tablet, Take 81 mg by mouth daily. Swallow whole., Disp: , Rfl:    Cholecalciferol  50 MCG (2000 UT) TABS, Take 2,000 Units by mouth at bedtime., Disp: , Rfl:    diclofenac  Sodium (VOLTAREN ) 1 % GEL, Apply 4 g topically 4 (four) times daily. (Patient taking differently: Apply 4 g topically 4 (four) times daily as needed.), Disp: 100 g, Rfl: 3   folic acid  (FOLVITE ) 400 MCG tablet, Take 400 mcg by mouth at bedtime., Disp: , Rfl:    ipratropium-albuterol  (DUONEB) 0.5-2.5 (3) MG/3ML SOLN, Inhale 3 mLs into the lungs every 6 (six) hours as needed., Disp: , Rfl:    lidocaine  (LIDODERM ) 5 %, Place 1 patch onto the skin daily as needed., Disp: , Rfl:    naloxone  (NARCAN ) nasal spray 4 mg/0.1 mL, As directed for opioid induced respiratory depression, Disp: 1 each, Rfl: 1   [START ON 08/02/2023] Oxycodone  HCl 10 MG TABS, Take 1 tablet (  10 mg total) by mouth 3 (three) times daily., Disp: 90 tablet, Rfl: 0   pantoprazole  (PROTONIX ) 40 MG tablet, Take by mouth., Disp: , Rfl:    rivastigmine (EXELON) 1.5 MG capsule, Take 1.5 mg by mouth 2 (two) times daily., Disp: , Rfl:    sertraline (ZOLOFT) 100 MG tablet, Take 100 mg by mouth at bedtime., Disp: , Rfl:    TRELEGY ELLIPTA  200-62.5-25 MCG/ACT AEPB, Inhale 1 puff into the lungs daily., Disp: , Rfl:    Past Medical History:  Diagnosis Date    Achalasia    Allergy    Arthritis    Depression    Emphysema of lung (HCC)    Fibromyalgia    Gastroesophageal reflux disease with esophagitis 01/26/2020   Formatting of this note might be different from the original. LA Grade C noted on EGD 01/2020   GERD (gastroesophageal reflux disease)    Herpes genitalis    History of chicken pox    History of kidney stones    Assessment and Plan:  1. Primary osteoarthritis of both knees   2. Primary osteoarthritis involving multiple joints   3. Fibromyalgia   4. Facet syndrome, lumbar   5. Chronic pain syndrome   6. Chronic, continuous use of opioids   7. Mixed Alzheimer's and vascular dementia (HCC)   8. Chronic kidney disease, stage 3a (HCC)   9. DDD (degenerative disc disease), thoracolumbar   10. Degeneration of intervertebral disc of lumbar region with discogenic back pain    Based on our conversation with the husband and ongoing issues with chronic pain I think is appropriate to continue with the oxycodone  10 mg tablets 3 times a day.  I reviewed the [place]  practitioner database information is appropriate for refill.  These will be dated for April 18 and May 18.  I have also renewed her Narcan  prescription.  Risks and benefits of chronic opioid therapy been once again reviewed and Katrina Simon and the family are aware of how to treat excess sedation with the medication.  Continue follow-up with her primary care physicians for baseline medical care with return to clinic scheduled in 2 months Follow Up Instructions:    I discussed the assessment and treatment plan with the patient. The patient was provided an opportunity to ask questions and all were answered. The patient agreed with the plan and demonstrated an understanding of the instructions.   The patient was advised to call back or seek an in-person evaluation if the symptoms worsen or if the condition fails to improve as anticipated.  I provided 30 minutes of non-face-to-face  time during this encounter.   Zula Hitch, MD

## 2023-08-05 LAB — NM MYOCAR MULTI W/SPECT W/WALL MOTION / EF
Base ST Depression (mm): 0 mm
Estimated workload: 1
Exercise duration (min): 1 min
LV dias vol: 29 mL (ref 46–106)
LV sys vol: 7 mL
MPHR: 149 {beats}/min
Nuc Stress EF: 76 %
Peak HR: 97 {beats}/min
Percent HR: 65 %
Rest HR: 60 {beats}/min
Rest Nuclear Isotope Dose: 10.7 mCi
SDS: 0
SRS: 0
SSS: 0
Stress Nuclear Isotope Dose: 30.8 mCi
TID: 0.7

## 2023-09-08 ENCOUNTER — Other Ambulatory Visit: Payer: Self-pay

## 2023-09-08 DIAGNOSIS — R2689 Other abnormalities of gait and mobility: Secondary | ICD-10-CM

## 2023-09-08 DIAGNOSIS — F0280 Dementia in other diseases classified elsewhere without behavioral disturbance: Secondary | ICD-10-CM

## 2023-09-08 DIAGNOSIS — R29898 Other symptoms and signs involving the musculoskeletal system: Secondary | ICD-10-CM

## 2023-09-17 ENCOUNTER — Ambulatory Visit
Admission: RE | Admit: 2023-09-17 | Discharge: 2023-09-17 | Disposition: A | Discharge status: Home / Self Care | Source: Ambulatory Visit

## 2023-09-17 DIAGNOSIS — R2689 Other abnormalities of gait and mobility: Secondary | ICD-10-CM | POA: Insufficient documentation

## 2023-09-17 DIAGNOSIS — R29898 Other symptoms and signs involving the musculoskeletal system: Secondary | ICD-10-CM | POA: Insufficient documentation

## 2023-09-17 DIAGNOSIS — G309 Alzheimer's disease, unspecified: Secondary | ICD-10-CM | POA: Insufficient documentation

## 2023-09-17 DIAGNOSIS — F0150 Vascular dementia without behavioral disturbance: Secondary | ICD-10-CM | POA: Diagnosis present

## 2023-09-17 DIAGNOSIS — F0280 Dementia in other diseases classified elsewhere without behavioral disturbance: Secondary | ICD-10-CM | POA: Diagnosis present

## 2023-09-28 ENCOUNTER — Ambulatory Visit: Attending: Anesthesiology | Admitting: Anesthesiology

## 2023-09-28 ENCOUNTER — Encounter: Payer: Self-pay | Admitting: Anesthesiology

## 2023-09-28 DIAGNOSIS — G894 Chronic pain syndrome: Secondary | ICD-10-CM | POA: Diagnosis not present

## 2023-09-28 DIAGNOSIS — F1190 Opioid use, unspecified, uncomplicated: Secondary | ICD-10-CM

## 2023-09-28 DIAGNOSIS — M170 Bilateral primary osteoarthritis of knee: Secondary | ICD-10-CM | POA: Diagnosis not present

## 2023-09-28 DIAGNOSIS — M150 Primary generalized (osteo)arthritis: Secondary | ICD-10-CM

## 2023-09-28 DIAGNOSIS — M47816 Spondylosis without myelopathy or radiculopathy, lumbar region: Secondary | ICD-10-CM | POA: Diagnosis not present

## 2023-09-28 DIAGNOSIS — M5135 Other intervertebral disc degeneration, thoracolumbar region: Secondary | ICD-10-CM

## 2023-09-28 DIAGNOSIS — M797 Fibromyalgia: Secondary | ICD-10-CM

## 2023-09-28 DIAGNOSIS — F0150 Vascular dementia without behavioral disturbance: Secondary | ICD-10-CM

## 2023-09-28 DIAGNOSIS — G309 Alzheimer's disease, unspecified: Secondary | ICD-10-CM

## 2023-09-28 DIAGNOSIS — N1831 Chronic kidney disease, stage 3a: Secondary | ICD-10-CM

## 2023-09-28 DIAGNOSIS — F0280 Dementia in other diseases classified elsewhere without behavioral disturbance: Secondary | ICD-10-CM

## 2023-09-28 DIAGNOSIS — Z79891 Long term (current) use of opiate analgesic: Secondary | ICD-10-CM

## 2023-09-28 MED ORDER — OXYCODONE HCL 10 MG PO TABS: 10.0000 mg | ORAL_TABLET | Freq: Three times a day (TID) | ORAL | 0 refills | Status: AC

## 2023-09-28 MED ORDER — OXYCODONE HCL 10 MG PO TABS: 10.0000 mg | ORAL_TABLET | Freq: Three times a day (TID) | ORAL | 0 refills | Status: DC

## 2023-09-28 NOTE — Progress Notes (Signed)
 Virtual Visit via Telephone Note  I connected with Katrina Simon on 09/28/23 at  1:40 PM EDT by telephone and verified that I am speaking with the correct person using two identifiers.  Location: Patient: Home Provider: [facility]   I discussed the limitations, risks, security and privacy concerns of performing an evaluation and management service by telephone and the availability of in person appointments. I also discussed with the patient that there may be a patient responsible charge related to this service. The patient expressed understanding and agreed to proceed.   History of Present Illness: I spoke with Katrina Simon and Katrina Simon ([facility]) via telephone as they were not able to do the virtual portion of the visit.  Katrina Simon is Katrina Simon's primary caretaker as she suffers from worsening dementia but continues to have chronic intractable low back pain and lower extremity pain.  She has been on oxycodone  10 mg tablets for an extended period of time secondary to this recalcitrant pain.  The pain is primarily a gnawing aching pain worse in the low back with radiation into the bilateral hip buttocks and down the legs with associated sciatica symptoms.  We reviewed her most recent MRI which was back in 2018 but Katrina Simon reports that she is not having any new troubles with lower extremity strength or function or bowel or bladder function.  She does not suffer from constipation or side effects from the opioid medications and continues to get good relief.  The quality of the pain has been stable in nature with no recent changes reported.  Occasionally she does wince when moving from seated to standing or gaining assistance from them but this also seems to have subsided.  She does use physical therapy and home health modalities to assist with ambulation and physical therapy for strength and muscle maintenance.  He maintains that without the medication she is in obvious distress as she moves about during the day and  becomes more bedridden when she is not taking her medications.  More conservative therapy has been ineffective.  Review of systems: General: No fevers or chills Pulmonary: No shortness of breath or dyspnea Cardiac: No angina or palpitations or lightheadedness GI: No abdominal pain or constipation Psych: No depression    Observations/Objective:  Current Outpatient Medications:    [START ON 11/01/2023] Oxycodone  HCl 10 MG TABS, Take 1 tablet (10 mg total) by mouth 3 (three) times daily., Disp: 90 tablet, Rfl: 0   acetaminophen  (TYLENOL ) 325 MG tablet, Take 2 tablets (650 mg total) by mouth every 6 (six) hours as needed for mild pain (pain score 1-3), moderate pain (pain score 4-6), fever or headache (or Fever >/= 101)., Disp: , Rfl:    aspirin  EC 81 MG tablet, Take 81 mg by mouth daily. Swallow whole., Disp: , Rfl:    Cholecalciferol  50 MCG (2000 UT) TABS, Take 2,000 Units by mouth at bedtime., Disp: , Rfl:    diclofenac  Sodium (VOLTAREN ) 1 % GEL, Apply 4 g topically 4 (four) times daily. (Patient taking differently: Apply 4 g topically 4 (four) times daily as needed.), Disp: 100 g, Rfl: 3   folic acid  (FOLVITE ) 400 MCG tablet, Take 400 mcg by mouth at bedtime., Disp: , Rfl:    ipratropium-albuterol  (DUONEB) 0.5-2.5 (3) MG/3ML SOLN, Inhale 3 mLs into the lungs every 6 (six) hours as needed., Disp: , Rfl:    lidocaine  (LIDODERM ) 5 %, Place 1 patch onto the skin daily as needed., Disp: , Rfl:    naloxone  (NARCAN )  nasal spray 4 mg/0.1 mL, As directed for opioid induced respiratory depression, Disp: 1 each, Rfl: 1   [START ON 10/02/2023] Oxycodone  HCl 10 MG TABS, Take 1 tablet (10 mg total) by mouth 3 (three) times daily., Disp: 90 tablet, Rfl: 0   pantoprazole  (PROTONIX ) 40 MG tablet, Take by mouth., Disp: , Rfl:    rivastigmine (EXELON) 1.5 MG capsule, Take 1.5 mg by mouth 2 (two) times daily., Disp: , Rfl:    sertraline (ZOLOFT) 100 MG tablet, Take 100 mg by mouth at bedtime., Disp: , Rfl:     TRELEGY ELLIPTA  200-62.5-25 MCG/ACT AEPB, Inhale 1 puff into the lungs daily., Disp: , Rfl:    Past Medical History:  Diagnosis Date   Achalasia    Allergy    Arthritis    Depression    Emphysema of lung (HCC)    Fibromyalgia    Gastroesophageal reflux disease with esophagitis 01/26/2020   Formatting of this note might be different from the original. LA Grade C noted on EGD 01/2020   GERD (gastroesophageal reflux disease)    Herpes genitalis    History of chicken pox    History of kidney stones    Assessment and Plan:  1. Primary osteoarthritis of both knees   2. Primary osteoarthritis involving multiple joints   3. Fibromyalgia   4. Facet syndrome, lumbar   5. Chronic pain syndrome   6. Chronic, continuous use of opioids   7. Mixed Alzheimer's and vascular dementia (HCC)   8. Chronic kidney disease, stage 3a (HCC)   9. DDD (degenerative disc disease), thoracolumbar    Based on our conversation after review of the [place]  practitioner database information agrees appropriate to refill her medicines for July 18 and August 17.  No other changes will be initiated.  She is doing well with chronic opioid maintenance therapy and this is working well to keep her as active as possible especially under the debilitated circumstances that she has.  With the medications can reports that she is able to stay comfortable during the day and assist with physical therapy and ambulation and sleeps comfortably at night.  Without the medications this is not the case.  No side effects are reported and she continues to respond favorably.  No other changes in her pharmacologic regimen will be initiated.  Have encouraged her to continue follow-up with her primary care physicians for baseline medical care with scheduled return to clinic in 2 months. Follow Up Instructions:    I discussed the assessment and treatment plan with the patient. The patient was provided an opportunity to ask questions and all  were answered. The patient agreed with the plan and demonstrated an understanding of the instructions.   The patient was advised to call back or seek an in-person evaluation if the symptoms worsen or if the condition fails to improve as anticipated.  I provided 30 minutes of non-face-to-face time during this encounter.   Katrina KANDICE Clause, MD

## 2023-10-11 ENCOUNTER — Other Ambulatory Visit: Payer: Self-pay

## 2023-10-11 ENCOUNTER — Emergency Department

## 2023-10-11 ENCOUNTER — Emergency Department: Admission: EM | Admit: 2023-10-11 | Discharge: 2023-10-11 | Disposition: A | Discharge status: Home / Self Care

## 2023-10-11 DIAGNOSIS — G309 Alzheimer's disease, unspecified: Secondary | ICD-10-CM | POA: Insufficient documentation

## 2023-10-11 DIAGNOSIS — I10 Essential (primary) hypertension: Secondary | ICD-10-CM | POA: Insufficient documentation

## 2023-10-11 DIAGNOSIS — J449 Chronic obstructive pulmonary disease, unspecified: Secondary | ICD-10-CM | POA: Diagnosis not present

## 2023-10-11 DIAGNOSIS — F0150 Vascular dementia without behavioral disturbance: Secondary | ICD-10-CM | POA: Insufficient documentation

## 2023-10-11 DIAGNOSIS — R55 Syncope and collapse: Secondary | ICD-10-CM | POA: Insufficient documentation

## 2023-10-11 DIAGNOSIS — R404 Transient alteration of awareness: Secondary | ICD-10-CM

## 2023-10-11 LAB — LACTIC ACID, PLASMA
Lactic Acid, Venous: 2.4 mmol/L (ref 0.5–1.9)
Lactic Acid, Venous: 1.1 mmol/L (ref 0.5–1.9)

## 2023-10-11 LAB — CBC WITH DIFFERENTIAL/PLATELET
Abs Immature Granulocytes: 0.04 K/uL (ref 0.00–0.07)
Basophils Absolute: 0 K/uL (ref 0.0–0.1)
Basophils Relative: 1 %
Eosinophils Absolute: 0.3 K/uL (ref 0.0–0.5)
Eosinophils Relative: 5 %
HCT: 35.6 % — ABNORMAL LOW (ref 36.0–46.0)
Hemoglobin: 11.5 g/dL — ABNORMAL LOW (ref 12.0–15.0)
Immature Granulocytes: 1 %
Lymphocytes Relative: 26 %
Lymphs Abs: 1.4 K/uL (ref 0.7–4.0)
MCH: 29.3 pg (ref 26.0–34.0)
MCHC: 32.3 g/dL (ref 30.0–36.0)
MCV: 90.6 fL (ref 80.0–100.0)
Monocytes Absolute: 0.3 K/uL (ref 0.1–1.0)
Monocytes Relative: 6 %
Neutro Abs: 3.4 K/uL (ref 1.7–7.7)
Neutrophils Relative %: 61 %
Platelets: 226 K/uL (ref 150–400)
RBC: 3.93 MIL/uL (ref 3.87–5.11)
RDW: 13.7 % (ref 11.5–15.5)
WBC: 5.4 K/uL (ref 4.0–10.5)
nRBC: 0 % (ref 0.0–0.2)

## 2023-10-11 LAB — COMPREHENSIVE METABOLIC PANEL WITH GFR
ALT: 12 U/L (ref 0–44)
AST: 28 U/L (ref 15–41)
Albumin: 3.9 g/dL (ref 3.5–5.0)
Alkaline Phosphatase: 103 U/L (ref 38–126)
Anion gap: 12 (ref 5–15)
BUN: 36 mg/dL — ABNORMAL HIGH (ref 8–23)
CO2: 26 mmol/L (ref 22–32)
Calcium: 9.6 mg/dL (ref 8.9–10.3)
Chloride: 103 mmol/L (ref 98–111)
Creatinine, Ser: 1.05 mg/dL — ABNORMAL HIGH (ref 0.44–1.00)
GFR, Estimated: 56 mL/min — ABNORMAL LOW (ref >60)
Glucose, Bld: 103 mg/dL — ABNORMAL HIGH (ref 70–99)
Potassium: 4.3 mmol/L (ref 3.5–5.1)
Sodium: 141 mmol/L (ref 135–145)
Total Bilirubin: 0.5 mg/dL (ref 0.0–1.2)
Total Protein: 7.3 g/dL (ref 6.5–8.1)

## 2023-10-11 LAB — URINALYSIS, ROUTINE W REFLEX MICROSCOPIC
Bilirubin Urine: NEGATIVE
Glucose, UA: NEGATIVE mg/dL
Hgb urine dipstick: NEGATIVE
Ketones, ur: NEGATIVE mg/dL
Nitrite: NEGATIVE
Protein, ur: NEGATIVE mg/dL
Specific Gravity, Urine: 1.01 (ref 1.005–1.030)
WBC, UA: >50 WBC/hpf (ref 0–5)
pH: 9 — ABNORMAL HIGH (ref 5.0–8.0)

## 2023-10-11 LAB — BLOOD GAS, VENOUS: Patient temperature: 37

## 2023-10-11 MED ORDER — SODIUM CHLORIDE 0.9 % IV BOLUS
1000.0000 mL | Freq: Once | INTRAVENOUS | Status: AC
  Administered 2023-10-11: 1000 mL via INTRAVENOUS

## 2023-10-11 NOTE — ED Notes (Signed)
 Husband stating pt brief has not been changed today, pt brief changed by this RN.

## 2023-10-11 NOTE — ED Notes (Signed)
 Per husband, pt sat up in bed like normal then screamed and body locked up. Husband stated she started snoring and after a while she started to open eyes to verbal stimulation.

## 2023-10-11 NOTE — ED Notes (Signed)
 Bladder scanned pt, 45mL found in bladder. Dr Nicholaus made aware, will hold off on catheter until 12.

## 2023-10-11 NOTE — ED Notes (Signed)
 Pt taken to CT.

## 2023-10-11 NOTE — ED Triage Notes (Signed)
 Pt BIB [facility] from Home, initially called out for unconscious, upon EMS arrival pt was awake and oriented to baseline per family. Pt has dementia and does not speak much. Per EMS pt had a potential seizure lasting less than 1 minute with no Hx of seizures. Vitals WNL with EMS 127 CBG

## 2023-10-11 NOTE — ED Notes (Signed)
 Weight provided by husband

## 2023-10-11 NOTE — ED Notes (Signed)
 Pt returned from CT

## 2023-10-11 NOTE — ED Provider Notes (Addendum)
 [facility] Provider Note    Event Date/Time   First MD Initiated Contact with Patient 10/11/23 [phone]     (approximate)   History   Loss of Consciousness   HPI  Katrina Simon is a 73 y.o. female who is nonverbal with Alzheimer's and vascular dementia (nonverbal at baseline), GERD, COPD, hypertension and major depressive disorder who presents after an episode of altered movements per her husband.  History is entirely obtained by the patient's husband who is at bedside.  Yes [place] is a completely normal day and they were lying in bed this morning when she awoke.  She suddenly sat up in bed and started screaming.  Then she fell back and her arms and legs stiffened but did not have any tonic-clonic movement.  This lasted approximately 30 seconds.  There was no urinary or bowel incontinence.  Patient did not seem to lose consciousness and was able to track the patient's husband throughout but was not responding per usual.  She was brought by EMS and glucose en route was greater than 100.  Per her husband she is currently back to her baseline.  They do follow with both cardiology and neurology with the next neurology appointment scheduled to be in October.  The only medication change has been a 50 mg increase in the patient's Zoloft earlier this month.  The patient has no prior history of seizures or syncope  Had an MRI brain completed on 09/17/2023: Which demonstrated age-related changes without acute intracranial pathology  Had a nuclear stress test with wall motion/ejection fraction completed 07/06/2023: Which demonstrated no evidence of stress-induced myocardial ischemia/low risk study with ejection fraction of 76%     Physical Exam   Triage Vital Signs: ED Triage Vitals  Encounter Vitals Group     BP      Girls Systolic BP Percentile      Girls Diastolic BP Percentile      Boys Systolic BP Percentile      Boys Diastolic BP Percentile      Pulse      Resp       Temp      Temp src      SpO2      Weight      Height      Head Circumference      Peak Flow      Pain Score      Pain Loc      Pain Education      Exclude from Growth Chart     Most recent vital signs: Vitals:   10/11/23 1400 10/11/23 1411  BP:  123/74  Pulse: 69 81  Resp: 19 19  Temp:  99.1 F (37.3 C)  SpO2: 100% 100%    Nursing Triage Note reviewed. Vital signs reviewed and patients oxygen saturation is normoxic  General:  well developed, awake and alert, resting comfortably in no acute distress Head: Normocephalic and atraumatic Eyes: Normal inspection, extraocular muscles intact, no conjunctival pallor Ear, nose, throat: Normal external exam Very dry mucous membranes Neck: Normal range of motion Respiratory: Patient is in no respiratory distress, lungs CTAB Cardiovascular: Patient is not tachycardic, RRR  GI: Abd SNT with no guarding or rebound  Back: Normal inspection of the back with good strength and range of motion throughout all ext Extremities: pulses intact with good cap refills, no LE pitting edema or calf tenderness Neuro: The patient is alert, able to follow simple directions, but non-verbal.  Strength and sensation grossly intact in all extremities Skin: Warm, dry, and intact   ED Results / Procedures / Treatments   Labs (all labs ordered are listed, but only abnormal results are displayed) Labs Reviewed  CBC WITH DIFFERENTIAL/PLATELET - Abnormal; Notable for the following components:      Result Value   Hemoglobin 11.5 (*)    HCT 35.6 (*)    All other components within normal limits  COMPREHENSIVE METABOLIC PANEL WITH GFR - Abnormal; Notable for the following components:   Glucose, Bld 103 (*)    BUN 36 (*)    Creatinine, Ser 1.05 (*)    GFR, Estimated 56 (*)    All other components within normal limits  LACTIC ACID, PLASMA - Abnormal; Notable for the following components:   Lactic Acid, Venous 2.4 (*)    All other components within normal  limits  BLOOD GAS, VENOUS - Abnormal; Notable for the following components:   Bicarbonate 29.1 (*)    Acid-Base Excess 3.5 (*)    All other components within normal limits  URINALYSIS, ROUTINE W REFLEX MICROSCOPIC - Abnormal; Notable for the following components:   Color, Urine YELLOW (*)    APPearance CLOUDY (*)    pH 9.0 (*)    Leukocytes,Ua LARGE (*)    Bacteria, UA RARE (*)    Non Squamous Epithelial PRESENT (*)    All other components within normal limits  LACTIC ACID, PLASMA     EKG EKG and rhythm strip are interpreted by myself:   EKG: [Normal sinus rhythm] at heart rate of 83, normal QRS duration, QTc 433, norma ST segments and T waves no ectopy EKG not consistent with Acute STEMI Rhythm strip: NSR in lead II   RADIOLOGY CT head: No intracranial hemorrhage on my independent review and interpretation and radiologist agrees    PROCEDURES:  Critical Care performed: No  Procedures   MEDICATIONS ORDERED IN ED: Medications  sodium chloride  0.9 % bolus 1,000 mL (0 mLs Intravenous Stopped 10/11/23 1307)     IMPRESSION / MDM / ASSESSMENT AND PLAN / ED COURSE                                Differential diagnosis includes, but is not limited to, seizure, syncope, abnormal movements secondary to Alzheimer's, UTI, electrolyte derangement  ED course: Patient arrives and is at her baseline per husband at bedside.  I appreciate no new focal neurological deficits.  Given husband's description of the event it is unclear to me whether this is an atypical seizure versus a syncopal event versus abnormal movements secondary to Alzheimer's dementia.  Patient is not acidotic.  Her lactic acid is only borderline dry but patient appears hypovolemic.  She was given 1 L of IV fluid and will repeat the lactic acid.  EKG without evidence of arrhythmia and is unclear to me whether this was a true syncopal event but reassured by her normal cardiac imaging just several months ago. Urinalysis  is pending.  However patient's husband feels comfortable returning home with outpatient follow-up once all diagnostics are completed    Clinical Course as of 10/11/23 1521  Sun Oct 11, 2023  1026 Blood gas, venous(!) VBG is unremarkable [HD]  1045 Lactic Acid, Venous(!!): 2.4 Slightly elevated, however patient does appear hypovolemic and does not have a leukocytosis.  I suspect this is secondary to hypovolemia rather than seizure activity but will obtain a repeat  after IV fluids [HD]  1321 Urinalysis, Routine w reflex microscopic -Urine, Clean Catch(!) Not consistent with UTI [HD]  1321 Lactic Acid, Venous: 1.1 Much improved [HD]  1326 I reviewed the results with the patient and her husband.  I reviewed that they should both follow-up with neurology and cardiology and the primary care physician.  All questions answered and patient's husband who is the legal guardian requested discharge [HD]    Clinical Course User Index [HD] Nicholaus Rolland BRAVO, MD   Risk: 5 This patient has a high risk of morbidity due to further diagnostic testing or treatment. Rationale: This patient's evaluation and management involve a high risk of morbidity due to the potential severity of presenting symptoms, need for diagnostic testing, and/or initiation of treatment that may require close monitoring. The differential includes conditions with potential for significant deterioration or requiring escalation of care. Treatment decisions in the ED, including medication administration, procedural interventions, or disposition planning, reflect this level of risk. Additional Support: -- Drug therapy requiring intensive monitoring for toxicity [ ]  -- Decision regarding elective major surgery with idenitified patient or procedure risk factors [ ]  -- Decision regarding hospitalization or escalation of hospital-level care [ ]  -- Decision not to resuscitate or to de-escalate care because of poor prognosis [ ]  -- Parental  controlled substances [ ]   COPA: 5 The patient has a severe exacerbation, progression, or side effect of treatment of the following illness/illnesses: []  OR  The patient has the following acute or chronic illness/injury that poses a possible threat to life or bodily function: [X] : The patient has a potentially serious acute condition or an acute exacerbation of a chronic illness requiring urgent evaluation and management in the Emergency Department. The clinical presentation necessitates immediate consideration of life-threatening or function-threatening diagnoses, even if they are ultimately ruled out.  Data(2/3 categories following were performed): [ ]  I reviewed or ordered at least three unique tests, external notes, and/or the history required an independent historian as one of the three requirements as following: CBC, CMP, UA, husband at bedside AND  I independently interpreted the following test: CT head without contrast OR  I discussed the management of the patient with the following external physician or qualified healthcare provider: []     Suggested E/M Coding Level: 5, 99285, This has been selected based on the 2021/10/31 CPT guidelines for E/M codes in the Emergency Department based on 2/3 of the CoPA, Data, and Risk.   FINAL CLINICAL IMPRESSION(S) / ED DIAGNOSES   Final diagnoses:  Episode of unresponsiveness     Rx / DC Orders   ED Discharge Orders     None        Note:  This document was prepared using Dragon voice recognition software and may include unintentional dictation errors.   Nicholaus Rolland BRAVO, MD 10/11/23 1059    Nicholaus Rolland BRAVO, MD 10/11/23 [phone]

## 2023-10-11 NOTE — Discharge Instructions (Addendum)
 You were seen in the emergency department after a transient episode of unresponsiveness.  It is unclear to me the cause of this episode, whether it was secondary to worsening Alzheimer's disease, a possible seizure (less likely given blood work today), or an arrhythmia.  I do think you are safe for discharge at this time.  Please continue your regular home medications.  Follow-up with your primary care physician as soon as possible.  I would make appointments to be seen as soon as possible with your neurologist and cardiologist.  Please return if any acutely worsening symptoms or any other emergency.  RETURN PRECAUTIONS & AFTERCARE: (ENGLISH) RETURN PRECAUTIONS: Return immediately to the emergency department or see/call your doctor if you feel worse, weak or have changes in speech or vision, are short of breath, have fever, vomiting, pain, bleeding or dark stool, trouble urinating or any new issues. Return here or see/call your doctor if not improving as expected for your suspected condition. FOLLOW-UP CARE: Call your doctor and/or any doctors we referred you to for more advice and to make an appointment. Do this today, tomorrow or after the weekend. Some doctors only take PPO insurance so if you have HMO insurance you may want to contact your HMO or your regular doctor for referral to a specialist within your plan. Either way tell the doctor's office that it was a referral from the emergency department so you get the soonest possible appointment.  YOUR TEST RESULTS: Take result reports of any blood or urine tests, imaging tests and EKG's to your doctor and any referral doctor. Have any abnormal tests repeated. Your doctor or a referral doctor can let you know when this should be done. Also make sure your doctor contacts this hospital to get any test results that are not currently available such as cultures or special tests for infection and final imaging reports, which are often not available at the time you  leave the ER but which may list additional important findings that are not documented on the preliminary report. BLOOD PRESSURE: If your blood pressure was greater than 120/80 have your blood pressure rechecked within 1 to 2 weeks. MEDICATION SIDE EFFECTS: Do not drive, walk, bike, take the bus, etc. if you have received or are being prescribed any sedating medications such as those for pain or anxiety or certain antihistamines like Benadryl. If you have been give one of these here get a taxi home or have a friend drive you home. Ask your pharmacist to counsel you on potential side effects of any new medication

## 2023-10-13 LAB — BLOOD GAS, VENOUS
Bicarbonate: 29.1 mmol/L — ABNORMAL HIGH (ref 20.0–28.0)
Patient temperature: 37 mmol/L — AB (ref 0.0–2.0)
pCO2, Ven: 47 mmHg (ref 44–60)
pH, Ven: 7.4 (ref 7.25–7.43)
pO2, Ven: 29.1 mmol/L — AB (ref 32–45)

## 2023-11-10 ENCOUNTER — Emergency Department

## 2023-11-10 ENCOUNTER — Observation Stay
Admission: EM | Admit: 2023-11-10 | Discharge: 2023-11-12 | Disposition: A | Discharge status: Home / Self Care | Attending: Internal Medicine | Admitting: Internal Medicine

## 2023-11-10 ENCOUNTER — Other Ambulatory Visit: Payer: Self-pay

## 2023-11-10 ENCOUNTER — Encounter: Payer: Self-pay | Admitting: *Deleted

## 2023-11-10 DIAGNOSIS — Z7901 Long term (current) use of anticoagulants: Secondary | ICD-10-CM | POA: Insufficient documentation

## 2023-11-10 DIAGNOSIS — G308 Other Alzheimer's disease: Secondary | ICD-10-CM | POA: Insufficient documentation

## 2023-11-10 DIAGNOSIS — A419 Sepsis, unspecified organism: Secondary | ICD-10-CM | POA: Diagnosis not present

## 2023-11-10 DIAGNOSIS — J189 Pneumonia, unspecified organism: Principal | ICD-10-CM | POA: Diagnosis present

## 2023-11-10 DIAGNOSIS — Z1152 Encounter for screening for COVID-19: Secondary | ICD-10-CM | POA: Insufficient documentation

## 2023-11-10 DIAGNOSIS — K219 Gastro-esophageal reflux disease without esophagitis: Secondary | ICD-10-CM | POA: Insufficient documentation

## 2023-11-10 DIAGNOSIS — Z79899 Other long term (current) drug therapy: Secondary | ICD-10-CM | POA: Insufficient documentation

## 2023-11-10 DIAGNOSIS — F32A Depression, unspecified: Secondary | ICD-10-CM | POA: Diagnosis not present

## 2023-11-10 DIAGNOSIS — Z87891 Personal history of nicotine dependence: Secondary | ICD-10-CM | POA: Diagnosis not present

## 2023-11-10 DIAGNOSIS — F0150 Vascular dementia without behavioral disturbance: Secondary | ICD-10-CM | POA: Diagnosis present

## 2023-11-10 DIAGNOSIS — R531 Weakness: Secondary | ICD-10-CM

## 2023-11-10 DIAGNOSIS — J449 Chronic obstructive pulmonary disease, unspecified: Secondary | ICD-10-CM | POA: Diagnosis not present

## 2023-11-10 DIAGNOSIS — E876 Hypokalemia: Secondary | ICD-10-CM | POA: Insufficient documentation

## 2023-11-10 DIAGNOSIS — J168 Pneumonia due to other specified infectious organisms: Secondary | ICD-10-CM | POA: Diagnosis not present

## 2023-11-10 DIAGNOSIS — J439 Emphysema, unspecified: Secondary | ICD-10-CM

## 2023-11-10 LAB — COMPREHENSIVE METABOLIC PANEL WITH GFR
ALT: 11 U/L (ref 0–44)
AST: 27 U/L (ref 15–41)
Albumin: 4.3 g/dL (ref 3.5–5.0)
Alkaline Phosphatase: 145 U/L — ABNORMAL HIGH (ref 38–126)
Anion gap: 13 (ref 5–15)
BUN: 22 mg/dL (ref 8–23)
CO2: 24 mmol/L (ref 22–32)
Calcium: 9.4 mg/dL (ref 8.9–10.3)
Chloride: 102 mmol/L (ref 98–111)
Creatinine, Ser: 0.95 mg/dL (ref 0.44–1.00)
GFR, Estimated: >60 mL/min (ref >60)
Glucose, Bld: 120 mg/dL — ABNORMAL HIGH (ref 70–99)
Potassium: 3.2 mmol/L — ABNORMAL LOW (ref 3.5–5.1)
Sodium: 139 mmol/L (ref 135–145)
Total Bilirubin: 0.9 mg/dL (ref 0.0–1.2)
Total Protein: 8.5 g/dL — ABNORMAL HIGH (ref 6.5–8.1)

## 2023-11-10 LAB — URINALYSIS, ROUTINE W REFLEX MICROSCOPIC
Bacteria, UA: NONE SEEN
Bilirubin Urine: NEGATIVE
Glucose, UA: NEGATIVE mg/dL
Ketones, ur: NEGATIVE mg/dL
Leukocytes,Ua: NEGATIVE
Nitrite: NEGATIVE
Protein, ur: 30 mg/dL — AB
RBC / HPF: >50 RBC/hpf (ref 0–5)
Specific Gravity, Urine: 1.027 (ref 1.005–1.030)
pH: 5 (ref 5.0–8.0)

## 2023-11-10 LAB — CBC
HCT: 36.4 % (ref 36.0–46.0)
Hemoglobin: 11.9 g/dL — ABNORMAL LOW (ref 12.0–15.0)
MCH: 29.7 pg (ref 26.0–34.0)
MCHC: 32.7 g/dL (ref 30.0–36.0)
MCV: 90.8 fL (ref 80.0–100.0)
Platelets: 216 K/uL (ref 150–400)
RBC: 4.01 MIL/uL (ref 3.87–5.11)
RDW: 13.6 % (ref 11.5–15.5)
WBC: 13.1 K/uL — ABNORMAL HIGH (ref 4.0–10.5)
nRBC: 0 % (ref 0.0–0.2)

## 2023-11-10 LAB — CBG MONITORING, ED: Glucose-Capillary: 126 mg/dL — ABNORMAL HIGH (ref 70–99)

## 2023-11-10 LAB — LACTIC ACID, PLASMA: Lactic Acid, Venous: 1.6 mmol/L (ref 0.5–1.9)

## 2023-11-10 LAB — RESP PANEL BY RT-PCR (RSV, FLU A&B, COVID)  RVPGX2
Influenza A by PCR: NEGATIVE
Influenza B by PCR: NEGATIVE
Resp Syncytial Virus by PCR: NEGATIVE
SARS Coronavirus 2 by RT PCR: NEGATIVE

## 2023-11-10 MED ORDER — ACETAMINOPHEN 325 MG PO TABS
650.0000 mg | ORAL_TABLET | Freq: Once | ORAL | Status: AC | PRN
  Administered 2023-11-10: 650 mg via ORAL
  Filled 2023-11-10: qty 2

## 2023-11-10 MED ORDER — SODIUM CHLORIDE 0.9 % IV BOLUS
1000.0000 mL | Freq: Once | INTRAVENOUS | Status: AC
  Administered 2023-11-10: 1000 mL via INTRAVENOUS

## 2023-11-10 NOTE — ED Provider Notes (Signed)
 [facility] Provider Note    Event Date/Time   First MD Initiated Contact with Patient 11/10/23 2152     (approximate)   History   Chief Complaint: Weakness and Altered Mental Status   HPI  Katrina Simon is a 73 y.o. female with a history of emphysema who is brought to the ED due to generalized weakness and altered mental status first noticed at 4:30 PM when picking the patient up from a family member's house.  She is unable to support her own weight as she usually does.  Losing her balance and leaning toward the left.  No falls or head trauma.  Family also notes she has been having a productive cough for the past 2 days along with decreased oral intake        Past Medical History:  Diagnosis Date   Achalasia    Allergy    Arthritis    Depression    Emphysema of lung (HCC)    Fibromyalgia    Gastroesophageal reflux disease with esophagitis 01/26/2020   Formatting of this note might be different from the original. LA Grade C noted on EGD 01/2020   GERD (gastroesophageal reflux disease)    Herpes genitalis    History of chicken pox    History of kidney stones     Current Outpatient Rx   Order #: [id number] Class: OTC   Order #: [id number] Class: Historical Med   Order #: [id number] Class: Historical Med   Order #: [id number] Class: Normal   Order #: [id number] Class: Historical Med   Order #: [id number] Class: Historical Med   Order #: [id number] Class: Historical Med   Order #: [id number] Class: Normal   Order #: [id number] Class: Normal   Order #: [id number] Class: Historical Med   Order #: [id number] Class: Historical Med   Order #: [id number] Class: Historical Med   Order #: [id number] Class: Historical Med    Past Surgical History:  Procedure Laterality Date   CESAREAN SECTION     COLONOSCOPY WITH PROPOFOL  N/A 10/03/2020   Procedure: COLONOSCOPY WITH PROPOFOL ;  Surgeon: Unk Corinn Skiff, MD;  Location: [facility] ENDOSCOPY;  Service: Gastroenterology;   Laterality: N/A;   COLONOSCOPY WITH PROPOFOL  N/A 10/04/2020   Procedure: COLONOSCOPY WITH PROPOFOL ;  Surgeon: Unk Corinn Skiff, MD;  Location: [facility]ERY [facility];  Service: Endoscopy;  Laterality: N/A;   ESOPHAGOGASTRODUODENOSCOPY (EGD) WITH PROPOFOL  N/A 11/19/2016   Procedure: ESOPHAGOGASTRODUODENOSCOPY (EGD) WITH PROPOFOL ;  Surgeon: Viktoria Lamar ONEIDA, MD;  Location: [facility] ENDOSCOPY;  Service: Endoscopy;  Laterality: N/A;   ESOPHAGOGASTRODUODENOSCOPY (EGD) WITH PROPOFOL  N/A 07/29/2021   Procedure: ESOPHAGOGASTRODUODENOSCOPY (EGD) WITH PROPOFOL ;  Surgeon: Unk Corinn Skiff, MD;  Location: [facility] ENDOSCOPY;  Service: Gastroenterology;  Laterality: N/A;   GIVENS CAPSULE STUDY N/A 08/08/2021   Procedure: GIVENS CAPSULE STUDY;  Surgeon: Unk Corinn Skiff, MD;  Location: [facility] ENDOSCOPY;  Service: Gastroenterology;  Laterality: N/A;   LAPAROSCOPY N/A    done at [facility] to release esophageal muscles    Physical Exam   Triage Vital Signs: ED Triage Vitals  Encounter Vitals Group     BP 11/10/23 2135 (!) 102/53     Girls Systolic BP Percentile --      Girls Diastolic BP Percentile --      Boys Systolic BP Percentile --      Boys Diastolic BP Percentile --      Pulse Rate 11/10/23 2135 87     Resp 11/10/23 2135 18     Temp 11/10/23 2138 (!) 100.7 F (38.2 C)  Temp Source 11/10/23 2138 Oral     SpO2 11/10/23 2135 95 %     Weight --      Height --      Head Circumference --      Peak Flow --      Pain Score --      Pain Loc --      Pain Education --      Exclude from Growth Chart --     Most recent vital signs: Vitals:   11/10/23 2300 11/10/23 2304  BP: (!) 106/94   Pulse: 83   Resp: 16   Temp:  100 F (37.8 C)  SpO2: 95%     General: Awake, no distress.  CV:  Good peripheral perfusion.  Regular rate rhythm Resp:  Normal effort.  Clear lungs bilaterally, quiet breath sounds Abd:  No distention.  Soft nontender Other:  Dry oral mucosa.   ED Results /  Procedures / Treatments   Labs (all labs ordered are listed, but only abnormal results are displayed) Labs Reviewed  COMPREHENSIVE METABOLIC PANEL WITH GFR - Abnormal; Notable for the following components:      Result Value   Potassium 3.2 (*)    Glucose, Bld 120 (*)    Total Protein 8.5 (*)    Alkaline Phosphatase 145 (*)    All other components within normal limits  CBC - Abnormal; Notable for the following components:   WBC 13.1 (*)    Hemoglobin 11.9 (*)    All other components within normal limits  URINALYSIS, ROUTINE W REFLEX MICROSCOPIC - Abnormal; Notable for the following components:   Color, Urine AMBER (*)    APPearance HAZY (*)    Hgb urine dipstick MODERATE (*)    Protein, ur 30 (*)    All other components within normal limits  CBG MONITORING, ED - Abnormal; Notable for the following components:   Glucose-Capillary 126 (*)    All other components within normal limits  RESP PANEL BY RT-PCR (RSV, FLU A&B, COVID)  [id number]  LACTIC ACID, PLASMA  LACTIC ACID, PLASMA     EKG Interpreted by me Normal sinus rhythm rate of 91.  Normal axis and intervals.  Poor R wave progression.  No acute ischemic changes   RADIOLOGY Chest x-ray interpreted by me, shows left basilar opacity suspicious for pneumonia.  Radiology report reviewed   PROCEDURES:  Procedures   MEDICATIONS ORDERED IN ED: Medications  acetaminophen  (TYLENOL ) tablet 650 mg (650 mg Oral Given 11/10/23 2205)  sodium chloride  0.9 % bolus 1,000 mL (1,000 mLs Intravenous New Bag/Given 11/10/23 2250)     IMPRESSION / MDM / ASSESSMENT AND PLAN / ED COURSE  I reviewed the triage vital signs and the nursing notes.  DDx: pneumonia, UTI, COVID, influenza, stroke, dehydration, AKI, electrolyte derangement  Patient's presentation is most consistent with acute presentation with potential threat to life or bodily function.  Patient presents with generalized weakness, balance difficulty, increased confusion today.   She has a fever of 100.7, suspect infection but not frankly septic.  Will give IV fluids, Tylenol .  Obtain CT head   ----------------------------------------- 12:03 AM on 11/11/2023 ----------------------------------------- CT confirms left lower lung pneumonia.  Otherwise unremarkable.  Will plan to admit due to change in her condition, underlying lung disease, age      FINAL CLINICAL IMPRESSION(S) / ED DIAGNOSES   Final diagnoses:  Pneumonia of left lower lobe due to infectious organism  Generalized weakness  Pulmonary emphysema, unspecified emphysema type (HCC)  Rx / DC Orders   ED Discharge Orders     None        Note:  This document was prepared using Dragon voice recognition software and may include unintentional dictation errors.   Viviann Pastor, MD 11/11/23 [phone]

## 2023-11-10 NOTE — ED Triage Notes (Signed)
 Pt spouse states that he picked her up from her daughters house around 1630 today, and he had to pick her up to put her in the truck because she was weak and unsteady- normally he just has to assist her standing. Leaning to one side and can't stand up today. Says she has also had a cough and her oxygen was low at home. Reports she has some dementia, but more confused than normal.  Per him, her daughter says that she has been ok during the day today.   Alert, follows some commands in triage, equal strength in extremities.

## 2023-11-10 NOTE — ED Notes (Signed)
 Put pt on the monitor and placed fall bundle

## 2023-11-11 DIAGNOSIS — F32A Depression, unspecified: Secondary | ICD-10-CM

## 2023-11-11 DIAGNOSIS — J449 Chronic obstructive pulmonary disease, unspecified: Secondary | ICD-10-CM | POA: Diagnosis not present

## 2023-11-11 DIAGNOSIS — J189 Pneumonia, unspecified organism: Secondary | ICD-10-CM | POA: Diagnosis present

## 2023-11-11 DIAGNOSIS — F0280 Dementia in other diseases classified elsewhere without behavioral disturbance: Secondary | ICD-10-CM

## 2023-11-11 DIAGNOSIS — K219 Gastro-esophageal reflux disease without esophagitis: Secondary | ICD-10-CM | POA: Insufficient documentation

## 2023-11-11 DIAGNOSIS — F0150 Vascular dementia without behavioral disturbance: Secondary | ICD-10-CM

## 2023-11-11 DIAGNOSIS — A419 Sepsis, unspecified organism: Principal | ICD-10-CM

## 2023-11-11 DIAGNOSIS — G309 Alzheimer's disease, unspecified: Secondary | ICD-10-CM

## 2023-11-11 LAB — BASIC METABOLIC PANEL WITH GFR
Anion gap: 8 (ref 5–15)
BUN: 20 mg/dL (ref 8–23)
CO2: 24 mmol/L (ref 22–32)
Calcium: 8.4 mg/dL — ABNORMAL LOW (ref 8.9–10.3)
Chloride: 108 mmol/L (ref 98–111)
Creatinine, Ser: 0.69 mg/dL (ref 0.44–1.00)
GFR, Estimated: >60 mL/min (ref >60)
Glucose, Bld: 112 mg/dL — ABNORMAL HIGH (ref 70–99)
Potassium: 3.1 mmol/L — ABNORMAL LOW (ref 3.5–5.1)
Sodium: 140 mmol/L (ref 135–145)

## 2023-11-11 LAB — CBC
HCT: 28.5 % — ABNORMAL LOW (ref 36.0–46.0)
Hemoglobin: 9.3 g/dL — ABNORMAL LOW (ref 12.0–15.0)
MCH: 29.3 pg (ref 26.0–34.0)
MCHC: 32.6 g/dL (ref 30.0–36.0)
MCV: 89.9 fL (ref 80.0–100.0)
Platelets: 175 K/uL (ref 150–400)
RBC: 3.17 MIL/uL — ABNORMAL LOW (ref 3.87–5.11)
RDW: 13.4 % (ref 11.5–15.5)
WBC: 9.1 K/uL (ref 4.0–10.5)
nRBC: 0 % (ref 0.0–0.2)

## 2023-11-11 LAB — PROTIME-INR
INR: 1.2 (ref 0.8–1.2)
Prothrombin Time: 15.5 s — ABNORMAL HIGH (ref 11.4–15.2)

## 2023-11-11 LAB — CORTISOL-AM, BLOOD: Cortisol - AM: 18.6 ug/dL (ref 6.7–22.6)

## 2023-11-11 MED ORDER — SERTRALINE HCL 50 MG PO TABS
100.0000 mg | ORAL_TABLET | Freq: Every day | ORAL | Status: DC
  Administered 2023-11-11: 100 mg via ORAL
  Filled 2023-11-11: qty 2

## 2023-11-11 MED ORDER — VITAMIN D 25 MCG (1000 UNIT) PO TABS
2000.0000 [IU] | ORAL_TABLET | Freq: Every day | ORAL | Status: DC
  Administered 2023-11-11: 2000 [IU] via ORAL
  Filled 2023-11-11: qty 2

## 2023-11-11 MED ORDER — TRAZODONE HCL 50 MG PO TABS: 25.0000 mg | ORAL_TABLET | Freq: Every evening | ORAL | Status: DC | PRN

## 2023-11-11 MED ORDER — MAGNESIUM HYDROXIDE 400 MG/5ML PO SUSP
30.0000 mL | Freq: Every day | ORAL | Status: DC | PRN
Start: 2023-11-11 — End: 2023-11-12

## 2023-11-11 MED ORDER — POTASSIUM CHLORIDE CRYS ER 20 MEQ PO TBCR
40.0000 meq | EXTENDED_RELEASE_TABLET | ORAL | Status: AC
  Administered 2023-11-11 (×2): 40 meq via ORAL
  Filled 2023-11-11 (×2): qty 2

## 2023-11-11 MED ORDER — ENOXAPARIN SODIUM 40 MG/0.4ML IJ SOSY
40.0000 mg | PREFILLED_SYRINGE | INTRAMUSCULAR | Status: DC
  Administered 2023-11-11 – 2023-11-12 (×2): 40 mg via SUBCUTANEOUS
  Filled 2023-11-11 (×2): qty 0.4

## 2023-11-11 MED ORDER — ACETAMINOPHEN 325 MG PO TABS
650.0000 mg | ORAL_TABLET | Freq: Four times a day (QID) | ORAL | Status: DC | PRN
Start: 2023-11-11 — End: 2023-11-12
  Administered 2023-11-11: 650 mg via ORAL
  Filled 2023-11-11 (×2): qty 2

## 2023-11-11 MED ORDER — ENOXAPARIN SODIUM 30 MG/0.3ML IJ SOSY: 30.0000 mg | PREFILLED_SYRINGE | INTRAMUSCULAR | Status: DC

## 2023-11-11 MED ORDER — FOLIC ACID 1 MG PO TABS
500.0000 ug | ORAL_TABLET | Freq: Every day | ORAL | Status: DC
  Administered 2023-11-11 – 2023-11-12 (×2): 0.5 mg via ORAL
  Filled 2023-11-11 (×2): qty 1

## 2023-11-11 MED ORDER — ONDANSETRON HCL 4 MG PO TABS: 4.0000 mg | ORAL_TABLET | Freq: Four times a day (QID) | ORAL | Status: DC | PRN

## 2023-11-11 MED ORDER — GUAIFENESIN ER 600 MG PO TB12
600.0000 mg | ORAL_TABLET | Freq: Two times a day (BID) | ORAL | Status: DC
  Administered 2023-11-11 – 2023-11-12 (×2): 600 mg via ORAL
  Filled 2023-11-11 (×3): qty 1

## 2023-11-11 MED ORDER — SODIUM CHLORIDE 0.9 % IV SOLN
2.0000 g | INTRAVENOUS | Status: DC
  Administered 2023-11-11: 2 g via INTRAVENOUS
  Filled 2023-11-11: qty 20

## 2023-11-11 MED ORDER — IPRATROPIUM-ALBUTEROL 0.5-2.5 (3) MG/3ML IN SOLN
3.0000 mL | Freq: Four times a day (QID) | RESPIRATORY_TRACT | Status: DC
  Administered 2023-11-11: 3 mL via RESPIRATORY_TRACT
  Filled 2023-11-11: qty 3

## 2023-11-11 MED ORDER — RIVASTIGMINE TARTRATE 1.5 MG PO CAPS
1.5000 mg | ORAL_CAPSULE | Freq: Two times a day (BID) | ORAL | Status: DC
  Administered 2023-11-11 – 2023-11-12 (×3): 1.5 mg via ORAL
  Filled 2023-11-11 (×3): qty 1

## 2023-11-11 MED ORDER — IPRATROPIUM-ALBUTEROL 0.5-2.5 (3) MG/3ML IN SOLN: 3.0000 mL | Freq: Four times a day (QID) | RESPIRATORY_TRACT | Status: DC | PRN

## 2023-11-11 MED ORDER — OXYCODONE HCL 5 MG PO TABS: 10.0000 mg | ORAL_TABLET | Freq: Three times a day (TID) | ORAL | Status: DC | PRN

## 2023-11-11 MED ORDER — SODIUM CHLORIDE 0.9 % IV SOLN
2.0000 g | Freq: Once | INTRAVENOUS | Status: AC
  Administered 2023-11-11: 2 g via INTRAVENOUS
  Filled 2023-11-11: qty 20

## 2023-11-11 MED ORDER — ACETAMINOPHEN 650 MG RE SUPP: 650.0000 mg | Freq: Four times a day (QID) | RECTAL | Status: DC | PRN

## 2023-11-11 MED ORDER — SODIUM CHLORIDE 0.9 % IV SOLN
500.0000 mg | Freq: Once | INTRAVENOUS | Status: AC
  Administered 2023-11-11: 500 mg via INTRAVENOUS
  Filled 2023-11-11: qty 5

## 2023-11-11 MED ORDER — HYDROCOD POLI-CHLORPHE POLI ER 10-8 MG/5ML PO SUER: 5.0000 mL | Freq: Two times a day (BID) | ORAL | Status: DC | PRN

## 2023-11-11 MED ORDER — PANTOPRAZOLE SODIUM 40 MG PO TBEC
40.0000 mg | DELAYED_RELEASE_TABLET | Freq: Every day | ORAL | Status: DC
  Administered 2023-11-12: 40 mg via ORAL
  Filled 2023-11-11 (×2): qty 1

## 2023-11-11 MED ORDER — ASPIRIN 81 MG PO TBEC
81.0000 mg | DELAYED_RELEASE_TABLET | Freq: Every day | ORAL | Status: DC
  Administered 2023-11-12: 81 mg via ORAL
  Filled 2023-11-11 (×2): qty 1

## 2023-11-11 MED ORDER — LACTATED RINGERS IV SOLN
150.0000 mL/h | INTRAVENOUS | Status: AC
  Administered 2023-11-11 (×3): 150 mL/h via INTRAVENOUS

## 2023-11-11 MED ORDER — NALOXONE HCL 4 MG/0.1ML NA LIQD: 1.0000 | Freq: Once | NASAL | Status: DC | PRN

## 2023-11-11 MED ORDER — SODIUM CHLORIDE 0.9 % IV SOLN
500.0000 mg | INTRAVENOUS | Status: DC
  Administered 2023-11-11: 500 mg via INTRAVENOUS
  Filled 2023-11-11: qty 5

## 2023-11-11 MED ORDER — ONDANSETRON HCL 4 MG/2ML IJ SOLN: 4.0000 mg | Freq: Four times a day (QID) | INTRAMUSCULAR | Status: DC | PRN

## 2023-11-11 NOTE — Plan of Care (Signed)

## 2023-11-11 NOTE — Evaluation (Signed)
 Physical Therapy Evaluation Patient Details Name: Katrina Simon MRN: [id number] DOB: 1950/12/28 Today's Date: 11/11/2023  History of Present Illness  Katrina Simon is a 73 y.o. Caucasian female with medical history significant for osteoarthritis, depression, emphysema, fibromyalgia, GERD, and urolithiasis, presented to the emergency room with acute onset worsening dyspnea with associated cough that is mainly dry as well as wheezing.  She had associated altered mental status and generalized weakness.   Clinical Impression  Patient received in bed she is alert, confused. Spouse at bedside. Patient requires max cues and assistance to perform all mobility ( which is her baseline). She was able to get oob and ambulate to bathroom and back to bed with min/mod hand held assist. She is limited by cognition. Patient will continue to benefit from skilled PT while here to improve mobility and strength.          If plan is discharge home, recommend the following: A lot of help with walking and/or transfers;A lot of help with bathing/dressing/bathroom;Supervision due to cognitive status   Can travel by private vehicle    yes    Equipment Recommendations None recommended by PT  Recommendations for Other Services       Functional Status Assessment Patient has had a recent decline in their functional status and demonstrates the ability to make significant improvements in function in a reasonable and predictable amount of time.     Precautions / Restrictions Precautions Precautions: Fall Recall of Precautions/Restrictions: Impaired Restrictions Weight Bearing Restrictions Per Provider Order: No      Mobility  Bed Mobility Overal bed mobility: Needs Assistance Bed Mobility: Supine to Sit, Sit to Supine     Supine to sit: Mod assist Sit to supine: Mod assist   General bed mobility comments: about at her baseline for bed mobility    Transfers Overall transfer level: Needs  assistance Equipment used: 1 person hand held assist Transfers: Sit to/from Stand Sit to Stand: Mod assist           General transfer comment: about at baseline per caregiver    Ambulation/Gait Ambulation/Gait assistance: Min assist, Mod assist Gait Distance (Feet): 25 Feet Assistive device: 1 person hand held assist Gait Pattern/deviations: Decreased step length - right, Decreased step length - left, Decreased stride length, Step-through pattern, Narrow base of support Gait velocity: decr     General Gait Details: patient requires min/mod A with ambulation due to cognition. Requires assistance at baseline. close to baseline at this time.  Stairs            Wheelchair Mobility     Tilt Bed    Modified Rankin (Stroke Patients Only)       Balance Overall balance assessment: Needs assistance Sitting-balance support: Feet supported Sitting balance-Leahy Scale: Good     Standing balance support: Single extremity supported, Bilateral upper extremity supported, During functional activity Standing balance-Leahy Scale: Fair                               Pertinent Vitals/Pain Pain Assessment Breathing: normal Negative Vocalization: none Facial Expression: smiling or inexpressive Body Language: relaxed Consolability: no need to console PAINAD Score: 0    Home Living Family/patient expects to be discharged to:: Private residence Living Arrangements: Spouse/significant other Available Help at Discharge: Family;Available 24 hours/day Type of Home: Mobile home Home Access: Stairs to enter Entrance Stairs-Rails: None Entrance Stairs-Number of Steps: 2   Home Layout: One level  Home Equipment: Rollator (4 wheels);Cane - single point Additional Comments: does not usually use AD, spouse helps her walk    Prior Function Prior Level of Function : Needs assist  Cognitive Assist : Mobility (cognitive);ADLs (cognitive) Mobility (Cognitive): Step by step  cues ADLs (Cognitive): Step by step cues Physical Assist : Mobility (physical) Mobility (physical): Bed mobility;Transfers;Gait;Stairs   Mobility Comments: hands on assist for all mobility; pt refuses to use AD/DME ADLs Comments: [place] is caregiver, with her 24/7.     Extremity/Trunk Assessment   Upper Extremity Assessment Upper Extremity Assessment: Overall WFL for tasks assessed    Lower Extremity Assessment Lower Extremity Assessment: Generalized weakness    Cervical / Trunk Assessment Cervical / Trunk Assessment: Normal  Communication   Communication Communication: Impaired Factors Affecting Communication: Reduced clarity of speech;Difficulty expressing self    Cognition Arousal: Alert Behavior During Therapy: WFL for tasks assessed/performed   PT - Cognitive impairments: History of cognitive impairments                       PT - Cognition Comments: family reports her cognition is a little worse than usual. Following commands: Impaired Following commands impaired: Follows one step commands with increased time, Follows one step commands inconsistently     Cueing Cueing Techniques: Verbal cues, Gestural cues, Tactile cues     General Comments      Exercises     Assessment/Plan    PT Assessment Patient needs continued PT services  PT Problem List Decreased strength;Decreased cognition;Decreased balance;Decreased mobility;Decreased coordination;Decreased knowledge of use of DME;Decreased safety awareness       PT Treatment Interventions Gait training;Functional mobility training;Stair training;Therapeutic activities;Therapeutic exercise;Balance training;Patient/family education    PT Goals (Current goals can be found in the Care Plan section)  Acute Rehab PT Goals Patient Stated Goal: return home with family and HHPT PT Goal Formulation: With family Time For Goal Achievement: 11/19/23 Potential to Achieve Goals: Good    Frequency Min 2X/week      Co-evaluation               AM-PAC PT 6 Clicks Mobility  Outcome Measure Help needed turning from your back to your side while in a flat bed without using bedrails?: A Lot Help needed moving from lying on your back to sitting on the side of a flat bed without using bedrails?: A Lot Help needed moving to and from a bed to a chair (including a wheelchair)?: A Lot Help needed standing up from a chair using your arms (e.g., wheelchair or bedside chair)?: A Lot Help needed to walk in hospital room?: A Lot Help needed climbing 3-5 steps with a railing? : A Lot 6 Click Score: 12    End of Session   Activity Tolerance: Patient tolerated treatment well Patient left: in bed;with call bell/phone within reach;with bed alarm set;with family/visitor present Nurse Communication: Mobility status PT Visit Diagnosis: Other abnormalities of gait and mobility (R26.89);Muscle weakness (generalized) (M62.81);Unsteadiness on feet (R26.81);Difficulty in walking, not elsewhere classified (R26.2)    Time: 8862-8847 PT Time Calculation (min) (ACUTE ONLY): 15 min   Charges:   PT Evaluation $PT Eval Moderate Complexity: 1 Mod   PT General Charges $$ ACUTE PT VISIT: 1 Visit         Khai Arrona, PT, GCS 11/11/23,12:06 PM

## 2023-11-11 NOTE — Assessment & Plan Note (Signed)
-   She will be placed on scheduled and as needed DuoNebs.

## 2023-11-11 NOTE — H&P (Signed)
 [place]   PATIENT NAME: Katrina Simon    MR#:  [id number]  DATE OF BIRTH:  02/04/1951  DATE OF ADMISSION:  11/10/2023  PRIMARY CARE PHYSICIAN: System, Provider Not In   Patient is coming from: Home  REQUESTING/REFERRING PHYSICIAN: Ernest Shuck, MD   CHIEF COMPLAINT:   Chief Complaint  Patient presents with   Weakness   Altered Mental Status    HISTORY OF PRESENT ILLNESS:  Katrina Simon is a 73 y.o. Caucasian female with medical history significant for osteoarthritis, depression, emphysema, fibromyalgia, GERD, and urolithiasis, presented to the emergency room with acute onset worsening dyspnea with associated cough that is mainly dry as well as wheezing.  She had associated altered mental status and generalized weakness.  No fever or chills.  No nausea or vomiting or abdominal pain.  The patient was significantly weak and was unable to support her own weight.  She has been losing balance and leaning towards the left.  No recent falls or trauma.  No reported chest pain or palpitations.  No further dysuria, oliguria or hematuria or flank pain.  No nausea or vomiting or abdominal pain.  ED Course: When the patient came to the ER, potassium 3.2 and blood glucose 120 with alk phos 145 total protein 8.5.  CBC showed leukocytosis 13.1 and CMP was remarkable for potassium 3.2 and blood glucose was 120 with alk phos 145.  Respiratory panel came back negative.  UA showed more than 50 RBCs 30 protein and 0-5 WBCs with no bacteria. EKG as reviewed by me : EKG showed normal sinus rhythm with a rate of 91 Imaging: Portable chest x-ray showed the following: 1. COPD with chronic appearing increased interstitial lung markings. 2. Mild biapical and left basilar scarring and/or atelectasis. Noncontrast head CT scan showed chronic atrophic and ischemic changes with no acute intracranial normalities.  The patient was given IV Rocephin  and Zithromax  650 mg p.o. Tylenol  and 1 L bolus of IV normal  saline.  She will be admitted to the medical telemetry bed for further evaluation and management. PAST MEDICAL HISTORY:   Past Medical History:  Diagnosis Date   Achalasia    Allergy    Arthritis    Depression    Emphysema of lung (HCC)    Fibromyalgia    Gastroesophageal reflux disease with esophagitis 01/26/2020   Formatting of this note might be different from the original. LA Grade C noted on EGD 01/2020   GERD (gastroesophageal reflux disease)    Herpes genitalis    History of chicken pox    History of kidney stones     PAST SURGICAL HISTORY:   Past Surgical History:  Procedure Laterality Date   CESAREAN SECTION     COLONOSCOPY WITH PROPOFOL  N/A 10/03/2020   Procedure: COLONOSCOPY WITH PROPOFOL ;  Surgeon: Unk Corinn Skiff, MD;  Location: [facility] ENDOSCOPY;  Service: Gastroenterology;  Laterality: N/A;   COLONOSCOPY WITH PROPOFOL  N/A 10/04/2020   Procedure: COLONOSCOPY WITH PROPOFOL ;  Surgeon: Unk Corinn Skiff, MD;  Location: [facility];  Service: Endoscopy;  Laterality: N/A;   ESOPHAGOGASTRODUODENOSCOPY (EGD) WITH PROPOFOL  N/A 11/19/2016   Procedure: ESOPHAGOGASTRODUODENOSCOPY (EGD) WITH PROPOFOL ;  Surgeon: Viktoria Lamar ONEIDA, MD;  Location: [facility] ENDOSCOPY;  Service: Endoscopy;  Laterality: N/A;   ESOPHAGOGASTRODUODENOSCOPY (EGD) WITH PROPOFOL  N/A 07/29/2021   Procedure: ESOPHAGOGASTRODUODENOSCOPY (EGD) WITH PROPOFOL ;  Surgeon: Unk Corinn Skiff, MD;  Location: [facility] ENDOSCOPY;  Service: Gastroenterology;  Laterality: N/A;   GIVENS CAPSULE STUDY N/A 08/08/2021  Procedure: GIVENS CAPSULE STUDY;  Surgeon: Unk Corinn Skiff, MD;  Location: [facility] ENDOSCOPY;  Service: Gastroenterology;  Laterality: N/A;   LAPAROSCOPY N/A    done at [facility] to release esophageal muscles    SOCIAL HISTORY:   Social History   Tobacco Use   Smoking status: Former    Current packs/day: 0.00    Average packs/day: 0.5 packs/day for 45.0 years (22.5 ttl pk-yrs)    Types:  Cigarettes    Start date: 09/16/1973    Quit date: 09/17/2018    Years since quitting: 5.1   Smokeless tobacco: Never  Substance Use Topics   Alcohol use: No    Alcohol/week: 0.0 standard drinks of alcohol    FAMILY HISTORY:   Family History  Problem Relation Age of Onset   Arthritis Mother    Heart disease Father    Stroke Father    Breast cancer Neg Hx     DRUG ALLERGIES:   Allergies  Allergen Reactions   Penicillins Hives and Rash    Other Reaction(s): Other (See Comments)   Statins Other (See Comments)    Myalgia    REVIEW OF SYSTEMS:   ROS As per history of present illness. All pertinent systems were reviewed above. Constitutional, HEENT, cardiovascular, respiratory, GI, GU, musculoskeletal, neuro, psychiatric, endocrine, integumentary and hematologic systems were reviewed and are otherwise negative/unremarkable except for positive findings mentioned above in the HPI.   MEDICATIONS AT HOME:   Prior to Admission medications   Medication Sig Start Date End Date Taking? Authorizing Provider  acetaminophen  (TYLENOL ) 325 MG tablet Take 2 tablets (650 mg total) by mouth every 6 (six) hours as needed for mild pain (pain score 1-3), moderate pain (pain score 4-6), fever or headache (or Fever >/= 101). 05/21/23  Yes Von Bellis, MD  aspirin  EC 81 MG tablet Take 81 mg by mouth daily. Swallow whole.   Yes [provider]  Cholecalciferol  50 MCG (2000 UT) TABS Take 2,000 Units by mouth at bedtime.   Yes [provider]  folic acid  (FOLVITE ) 400 MCG tablet Take 400 mcg by mouth at bedtime.   Yes [provider]  ipratropium-albuterol  (DUONEB) 0.5-2.5 (3) MG/3ML SOLN Inhale 3 mLs into the lungs every 6 (six) hours as needed. 03/19/23 04/17/24 Yes [provider]  naloxone  (NARCAN ) nasal spray 4 mg/0.1 mL As directed for opioid induced respiratory depression 07/29/23  Yes Myra Lynwood MATSU, MD  Oxycodone  HCl 10 MG TABS Take 1 tablet (10 mg total) by mouth 3  (three) times daily. 11/01/23 12/01/23 Yes Myra Lynwood MATSU, MD  pantoprazole  (PROTONIX ) 40 MG tablet Take by mouth. 07/13/22 02/22/26 Yes [provider]  rivastigmine  (EXELON ) 1.5 MG capsule Take 1.5 mg by mouth 2 (two) times daily. 05/14/23 05/13/24 Yes [provider]  sertraline  (ZOLOFT ) 100 MG tablet Take 100 mg by mouth at bedtime. 11/13/22  Yes [provider]  TRELEGY ELLIPTA  200-62.5-25 MCG/ACT AEPB Inhale 1 puff into the lungs daily. 04/23/23  Yes [provider]  diclofenac  Sodium (VOLTAREN ) 1 % GEL Apply 4 g topically 4 (four) times daily. Patient taking differently: Apply 4 g topically 4 (four) times daily as needed. 12/14/19   Hyatt, Max T, DPM  lidocaine  (LIDODERM ) 5 % Place 1 patch onto the skin daily as needed. Patient not taking: Reported on 11/11/2023 05/26/21   [provider]      VITAL SIGNS:  Blood pressure 113/75, pulse 81, temperature 98.6 F (37 C), temperature source Oral, resp. rate  18, height 5' 5 (1.651 m), weight 47.4 kg, SpO2 94%.  PHYSICAL EXAMINATION:  Physical Exam  GENERAL:  73 y.o.-year-old Caucasian female patient lying in the bed with no acute distress.  EYES: Pupils equal, round, reactive to light and accommodation. No scleral icterus. Extraocular muscles intact.  HEENT: Head atraumatic, normocephalic. Oropharynx and nasopharynx clear.  NECK:  Supple, no jugular venous distention. No thyroid enlargement, no tenderness.  LUNGS: Diminished bibasilar breath sounds with bibasal crackles.. No use of accessory muscles of respiration.  CARDIOVASCULAR: Regular rate and rhythm, S1, S2 normal. No murmurs, rubs, or gallops.  ABDOMEN: Soft, nondistended, nontender. Bowel sounds present. No organomegaly or mass.  EXTREMITIES: No pedal edema, cyanosis, or clubbing.  NEUROLOGIC: Cranial nerves II through XII are intact. Muscle strength 5/5 in all extremities. Sensation intact. Gait not checked.  PSYCHIATRIC: The patient is alert  and oriented x 3.  Normal affect and good eye contact. SKIN: No obvious rash, lesion, or ulcer.   LABORATORY PANEL:   CBC Recent Labs  Lab 11/10/23 2139  WBC 13.1*  HGB 11.9*  HCT 36.4  PLT 216   ------------------------------------------------------------------------------------------------------------------  Chemistries  Recent Labs  Lab 11/10/23 2139  NA 139  K 3.2*  CL 102  CO2 24  GLUCOSE 120*  BUN 22  CREATININE 0.95  CALCIUM 9.4  AST 27  ALT 11  ALKPHOS 145*  BILITOT 0.9   ------------------------------------------------------------------------------------------------------------------  Cardiac Enzymes No results for input(s): TROPONINI in the last 168 hours. ------------------------------------------------------------------------------------------------------------------  RADIOLOGY:  CT CHEST ABDOMEN PELVIS WO CONTRAST Result Date: 11/10/2023 CLINICAL DATA:  Sepsis EXAM: CT CHEST, ABDOMEN AND PELVIS WITHOUT CONTRAST TECHNIQUE: Multidetector CT imaging of the chest, abdomen and pelvis was performed following the standard protocol without IV contrast. RADIATION DOSE REDUCTION: This exam was performed according to the departmental dose-optimization program which includes automated exposure control, adjustment of the mA and/or kV according to patient size and/or use of iterative reconstruction technique. COMPARISON:  X-ray from earlier in the same day. FINDINGS: CT CHEST FINDINGS Cardiovascular: Somewhat limited due to lack of IV contrast. Atherosclerotic calcifications of the aorta are noted without aneurysmal dilatation. No cardiac enlargement is seen. Mild coronary calcifications are noted. Mediastinum/Nodes: The thoracic inlet is within normal limits. No hilar or mediastinal adenopathy is noted. The esophagus as visualized is within normal limits. Lungs/Pleura: The lungs are well aerated with subpleural fibrotic changes and mild emphysematous change. Pleural and  parenchymal scarring is noted bilaterally. Patchy infiltrate is seen within lower lobe similar to that seen on recent chest x-ray consistent with left lower lobe pneumonia. Associated small effusion is noted. No parenchymal nodules are seen. Musculoskeletal: No acute rib abnormality is noted. No compression deformities are noted. Mild degenerative changes of the thoracic spine are seen. CT ABDOMEN PELVIS FINDINGS Hepatobiliary: No focal liver abnormality is seen. No gallstones, gallbladder wall thickening, or biliary dilatation. Pancreas: Unremarkable. No pancreatic ductal dilatation or surrounding inflammatory changes. Spleen: Normal in size without focal abnormality. Adrenals/Urinary Tract: Adrenal glands are within normal limits. The kidneys show no renal calculi or obstructive changes. The bladder is partially distended. Air is noted within the bladder likely related to recent instrumentation. Stomach/Bowel: Considerable fecal material is noted within the rectum with mild wall thickening consistent with rectal impaction. The more proximal colon shows diffuse retained fecal material consistent with at least moderate colonic constipation. The appendix is within normal limits. Stomach and small bowel are unremarkable. Vascular/Lymphatic: Aortic atherosclerosis. No enlarged abdominal or pelvic lymph nodes. Reproductive: Uterus and bilateral  adnexa are unremarkable. Other: No abdominal wall hernia or abnormality. No abdominopelvic ascites. Musculoskeletal: Degenerative changes of lumbar spine are noted. No acute bony abnormality is seen. IMPRESSION: CT of the chest left lower lobe infiltrate with associated small effusion. CT of the abdomen and pelvis: Findings consistent with rectal impaction and at least moderate, a constipation. Electronically Signed   By: Oneil Devonshire M.D.   On: 11/10/2023 23:59   CT Head Wo Contrast Result Date: 11/10/2023 CLINICAL DATA:  Altered mental status and possible sepsis EXAM: CT  HEAD WITHOUT CONTRAST TECHNIQUE: Contiguous axial images were obtained from the base of the skull through the vertex without intravenous contrast. RADIATION DOSE REDUCTION: This exam was performed according to the departmental dose-optimization program which includes automated exposure control, adjustment of the mA and/or kV according to patient size and/or use of iterative reconstruction technique. COMPARISON:  10/11/2023 FINDINGS: Brain: No evidence of acute infarction, hemorrhage, hydrocephalus, extra-axial collection or mass lesion/mass effect. Chronic atrophic and white matter ischemic changes are noted. Vascular: No hyperdense vessel or unexpected calcification. Skull: Normal. Negative for fracture or focal lesion. Sinuses/Orbits: No acute finding. Other: None. IMPRESSION: Chronic atrophic and ischemic changes without acute abnormality. Electronically Signed   By: Oneil Devonshire M.D.   On: 11/10/2023 23:49   DG Chest 1 View Result Date: 11/10/2023 CLINICAL DATA:  Weakness. EXAM: CHEST  1 VIEW COMPARISON:  May 17, 2023 FINDINGS: The heart size and mediastinal contours are within normal limits. The lungs are hyperinflated with evidence of emphysematous lung disease. Diffuse, chronic appearing increased interstitial lung markings are seen. Mild scarring and/or atelectasis is noted within the bilateral apices and left lung base. No pleural effusion or pneumothorax is identified. The visualized skeletal structures are unremarkable. IMPRESSION: 1. COPD with chronic appearing increased interstitial lung markings. 2. Mild biapical and left basilar scarring and/or atelectasis. Electronically Signed   By: Suzen Dials M.D.   On: 11/10/2023 22:21      IMPRESSION AND PLAN:  Assessment and Plan: * Sepsis due to pneumonia [facility]) - The patient will be admitted to a medical telemetry bed. - Will continue antibiotic therapy with IV Rocephin  and Zithromax . - Mucolytic therapy be provided as well as duo nebs  q.i.d. and q.4 hours p.r.n. - We will follow blood cultures. -The patient will be hydrated with IV lactated ringer . - Sepsis manifested by tachycardia and tachypnea.  Chronic obstructive pulmonary disease (COPD) (HCC) - She will be placed on scheduled and as needed DuoNebs.  GERD without esophagitis - Will continue PPI therapy.  Mixed Alzheimer's and vascular dementia (HCC) - Will continue her Exelon .  Depression - Will continue Zoloft .     DVT prophylaxis: Lovenox . Advanced Care Planning:  Code Status: full code. Family Communication:  The plan of care was discussed in details with the patient (and family). I answered all questions. The patient agreed to proceed with the above mentioned plan. Further management will depend upon hospital course. Disposition Plan: Back to previous home environment Consults called: none. All the records are reviewed and case discussed with ED provider.  Status is: Inpatient    At the time of the admission, it appears that the appropriate admission status for this patient is inpatient.  This is judged to be reasonable and necessary in order to provide the required intensity of service to ensure the patient's safety given the presenting symptoms, physical exam findings and initial radiographic and laboratory data in the context of comorbid conditions.  The patient requires inpatient status due  to high intensity of service, high risk of further deterioration and high frequency of surveillance required.  I certify that at the time of admission, it is my clinical judgment that the patient will require inpatient hospital care extending more than 2 midnights.                            Dispo: The patient is from: Home              Anticipated d/c is to: Home              Patient currently is not medically stable to d/c.              Difficult to place patient: No  [place]ison DELENA Peaches M.D on 11/11/2023 at 6:35 AM  [facility]   From 7 PM-7 AM,  contact night-coverage [email or web address]  CC: Primary care physician; System, Provider Not In

## 2023-11-11 NOTE — Assessment & Plan Note (Signed)
Will continue Zoloft. 

## 2023-11-11 NOTE — Assessment & Plan Note (Signed)
-   The patient will be admitted to a medical telemetry bed. - Will continue antibiotic therapy with IV Rocephin  and Zithromax . - Mucolytic therapy be provided as well as duo nebs q.i.d. and q.4 hours p.r.n. - We will follow blood cultures. -The patient will be hydrated with IV lactated ringer . - Sepsis manifested by tachycardia and tachypnea.

## 2023-11-11 NOTE — Progress Notes (Addendum)
 Progress Note   Patient: Katrina Simon FMW:[id number] DOB: 1950-06-21 DOA: 11/10/2023     1 DOS: the patient was seen and examined on 11/11/2023   Brief hospital course: From HPI Katrina Simon is a 73 y.o. Caucasian female with medical history significant for osteoarthritis, depression, emphysema, fibromyalgia, GERD, and urolithiasis, presented to the emergency room with acute onset worsening dyspnea with associated cough that is mainly dry as well as wheezing.  She had associated altered mental status and generalized weakness.  No fever or chills.  No nausea or vomiting or abdominal pain.  The patient was significantly weak and was unable to support her own weight.  She has been losing balance and leaning towards the left.  No recent falls or trauma.  No reported chest pain or palpitations.  No further dysuria, oliguria or hematuria or flank pain.  No nausea or vomiting or abdominal pain.   ED Course: When the patient came to the ER, potassium 3.2 and blood glucose 120 with alk phos 145 total protein 8.5.  CBC showed leukocytosis 13.1 and CMP was remarkable for potassium 3.2 and blood glucose was 120 with alk phos 145.  Respiratory panel came back negative.  UA showed more than 50 RBCs 30 protein and 0-5 WBCs with no bacteria. EKG as reviewed by me : EKG showed normal sinus rhythm with a rate of 91   Assessment and Plan:  Sepsis due to pneumonia (HCC) - Continue incentive spirometer Continue ceftriaxone  and azithromycin  - Sepsis manifested by tachycardia and tachypnea.   Chronic obstructive pulmonary disease (COPD) (HCC) - She will be placed on scheduled and as needed DuoNebs.   GERD without esophagitis - Will continue PPI therapy.   Mixed Alzheimer's and vascular dementia (HCC) - Will continue her Exelon .   Depression - Will continue Zoloft .   Hypokalemia-continue repletion and monitoring   DVT prophylaxis: Lovenox .  Advanced Care Planning:  Code Status: full code.  Family  Communication: Discussed with husband present at bedside  Subjective:  Seen and examined at bedside this morning She did have a temperature spike up to 100.7 Noted hypokalemia Admits to some improvement in respiratory function Denies nausea vomiting chest pain  Physical Exam:  GENERAL:  73 y.o.-year-old Caucasian female patient lying in the bed with no acute distress.  EYES: Pupils equal, round, reactive to light and accommodation. No scleral icterus. Extraocular muscles intact.  HEENT: Head atraumatic, normocephalic. Oropharynx and nasopharynx clear.  NECK:  Supple, no jugular venous distention. No thyroid enlargement, no tenderness.  LUNGS: Diminished bibasilar breath sounds with bibasal crackles.. No use of accessory muscles of respiration.  CARDIOVASCULAR: Regular rate and rhythm, S1, S2 normal. No murmurs, rubs, or gallops.  ABDOMEN: Soft, nondistended, nontender. Bowel sounds present. No organomegaly or mass.  EXTREMITIES: No pedal edema, cyanosis, or clubbing.  NEUROLOGIC: Cranial nerves II through XII are intact. Muscle strength 5/5 in all extremities. Sensation intact. Gait not checked.  PSYCHIATRIC: The patient is alert and oriented x 3.  Normal affect and good eye contact.  Vitals:   11/11/23 0220 11/11/23 0740 11/11/23 0749 11/11/23 1510  BP: 113/75 (!) 117/59  115/74  Pulse: 81 85  88  Resp: 18 16  17   Temp: 98.6 F (37 C) 99.5 F (37.5 C)  97.9 F (36.6 C)  TempSrc: Oral   Oral  SpO2: 94% 92% 94% 94%  Weight: 47.4 kg     Height: 5' 5 (1.651 m)       Data Reviewed: I Have  reviewed patient chest x-ray showing findings of infiltrates    Latest Ref Rng & Units 11/11/2023    6:21 AM 11/10/2023    9:39 PM 10/11/2023    9:59 AM  CBC  WBC 4.0 - 10.5 K/uL 9.1  13.1  5.4   Hemoglobin 12.0 - 15.0 g/dL 9.3  88.0  88.4   Hematocrit 36.0 - 46.0 % 28.5  36.4  35.6   Platelets 150 - 400 K/uL 175  216  226        Latest Ref Rng & Units 11/11/2023    6:21 AM 11/10/2023     9:39 PM 10/11/2023    9:59 AM  BMP  Glucose 70 - 99 mg/dL 887  879  896   BUN 8 - 23 mg/dL 20  22  36   Creatinine 0.44 - 1.00 mg/dL 9.30  9.04  8.94   Sodium 135 - 145 mmol/L 140  139  141   Potassium 3.5 - 5.1 mmol/L 3.1  3.2  4.3   Chloride 98 - 111 mmol/L 108  102  103   CO2 22 - 32 mmol/L 24  24  26    Calcium 8.9 - 10.3 mg/dL 8.4  9.4  9.6      Time spent: 51 minutes  Author: Drue ONEIDA Potter, MD 11/11/2023 3:40 PM  For on call review [email or web address].

## 2023-11-11 NOTE — Assessment & Plan Note (Signed)
-   Will continue her Exelon .

## 2023-11-11 NOTE — Assessment & Plan Note (Signed)
-   Will continue PPI therapy.

## 2023-11-11 NOTE — Care Management CC44 (Signed)
 Condition Code 44 Documentation Completed  Patient Details  Name: Katrina Simon MRN: [id number] Date of Birth: 1950-09-04   Condition Code 44 given:  Yes Patient signature on Condition Code 44 notice:  Yes Documentation of 2 MD's agreement:  Yes Code 44 added to claim:  Yes    Violia Knopf, LCSW 11/11/2023, 3:00 PM

## 2023-11-12 DIAGNOSIS — J189 Pneumonia, unspecified organism: Secondary | ICD-10-CM | POA: Diagnosis not present

## 2023-11-12 DIAGNOSIS — A419 Sepsis, unspecified organism: Secondary | ICD-10-CM | POA: Diagnosis not present

## 2023-11-12 LAB — BASIC METABOLIC PANEL WITH GFR
Anion gap: 9 (ref 5–15)
BUN: 11 mg/dL (ref 8–23)
CO2: 24 mmol/L (ref 22–32)
Calcium: 8.7 mg/dL — ABNORMAL LOW (ref 8.9–10.3)
Chloride: 105 mmol/L (ref 98–111)
Creatinine, Ser: 0.74 mg/dL (ref 0.44–1.00)
GFR, Estimated: >60 mL/min (ref >60)
Glucose, Bld: 101 mg/dL — ABNORMAL HIGH (ref 70–99)
Potassium: 3.4 mmol/L — ABNORMAL LOW (ref 3.5–5.1)
Sodium: 138 mmol/L (ref 135–145)

## 2023-11-12 MED ORDER — MAGNESIUM HYDROXIDE 400 MG/5ML PO SUSP: 30.0000 mL | Freq: Every day | ORAL | 0 refills | Status: AC | PRN

## 2023-11-12 MED ORDER — CEFDINIR 300 MG PO CAPS: 300.0000 mg | ORAL_CAPSULE | Freq: Two times a day (BID) | ORAL | 0 refills | Status: AC

## 2023-11-12 MED ORDER — AZITHROMYCIN 500 MG PO TABS: 500.0000 mg | ORAL_TABLET | Freq: Every day | ORAL | 0 refills | Status: AC

## 2023-11-12 MED ORDER — AZITHROMYCIN 500 MG PO TABS: 500.0000 mg | ORAL_TABLET | Freq: Every day | ORAL | Status: DC

## 2023-11-12 NOTE — Discharge Summary (Signed)
 Physician Discharge Summary   Patient: Katrina Simon MRN: [id number] DOB: [birth year]  Admit date:     11/10/2023  Discharge date: 11/12/23  Discharge Physician: Drue ONEIDA Potter   PCP: System, Provider Not In   Recommendations at discharge:   Follow-up with PCP  Discharge Diagnoses:  Sepsis due to pneumonia [facility]) Chronic obstructive pulmonary disease (COPD) (HCC) GERD without esophagitis Mixed Alzheimer's and vascular dementia (HCC) Depression Hypokalemia   Hospital Course: From HPI [name] is a 73 y.o. Caucasian female with medical history significant for osteoarthritis, depression, emphysema, fibromyalgia, GERD, and urolithiasis, presented to the emergency room with acute onset worsening dyspnea with associated cough that is mainly dry as well as wheezing.  She had associated altered mental status and generalized weakness.  No fever or chills.  No nausea or vomiting or abdominal pain.  The patient was significantly weak and was unable to support her own weight.  She has been losing balance and leaning towards the left.  No recent falls or trauma.  No reported chest pain or palpitations.  No further dysuria, oliguria or hematuria or flank pain.  No nausea or vomiting or abdominal pain.   ED Course: When the patient came to the ER, potassium 3.2 and blood glucose 120 with alk phos 145 total protein 8.5.  CBC showed leukocytosis 13.1 and CMP was remarkable for potassium 3.2 and blood glucose was 120 with alk phos 145.  Respiratory panel came back negative.  UA showed more than 50 RBCs 30 protein and 0-5 WBCs with no bacteria. EKG as reviewed by me : EKG showed normal sinus rhythm with a rate of 91    Patient required IV antibiotic treatment with improvement in respiratory function Was able to work with physical therapy and did not require oxygen Patient is therefore being discharged home today to complete a course of oral antibiotic therapy.  Consultants: None Procedures  performed: None Disposition: Home health Diet recommendation:  Cardiac diet DISCHARGE MEDICATION: Allergies as of 11/12/2023       Reactions   Penicillins Hives, Rash   Other Reaction(s): Other (See Comments)   Statins Other (See Comments)   Myalgia        Medication List     TAKE these medications    acetaminophen  325 MG tablet Commonly known as: TYLENOL  Take 2 tablets (650 mg total) by mouth every 6 (six) hours as needed for mild pain (pain score 1-3), moderate pain (pain score 4-6), fever or headache (or Fever >/= 101).   aspirin  EC 81 MG tablet Take 81 mg by mouth daily. Swallow whole.   azithromycin  500 MG tablet Commonly known as: Zithromax  Take 1 tablet (500 mg total) by mouth daily for 3 days.   cefdinir  300 MG capsule Commonly known as: OMNICEF  Take 1 capsule (300 mg total) by mouth 2 (two) times daily for 5 days.   Cholecalciferol  50 MCG (2000 UT) Tabs Take 2,000 Units by mouth at bedtime.   diclofenac  Sodium 1 % Gel Commonly known as: Voltaren  Apply 4 g topically 4 (four) times daily. What changed:  when to take this reasons to take this   folic acid  400 MCG tablet Commonly known as: FOLVITE  Take 400 mcg by mouth at bedtime.   ipratropium-albuterol  0.5-2.5 (3) MG/3ML Soln Commonly known as: DUONEB Inhale 3 mLs into the lungs every 6 (six) hours as needed.   lidocaine  5 % Commonly known as: LIDODERM  Place 1 patch onto the skin daily as needed.   magnesium   hydroxide 400 MG/5ML suspension Commonly known as: MILK OF MAGNESIA Take 30 mLs by mouth daily as needed for mild constipation.   naloxone  4 MG/0.1ML Liqd nasal spray kit Commonly known as: NARCAN  As directed for opioid induced respiratory depression   Oxycodone  HCl 10 MG Tabs Take 1 tablet (10 mg total) by mouth 3 (three) times daily.   pantoprazole  40 MG tablet Commonly known as: PROTONIX  Take by mouth.   rivastigmine  1.5 MG capsule Commonly known as: EXELON  Take 1.5 mg by mouth 2  (two) times daily.   sertraline  100 MG tablet Commonly known as: ZOLOFT  Take 100 mg by mouth at bedtime.   Trelegy Ellipta  200-62.5-25 MCG/ACT Aepb Generic drug: Fluticasone -Umeclidin-Vilant Inhale 1 puff into the lungs daily.        Discharge Exam: Filed Weights   11/11/23 0220  Weight: 47.4 kg    GENERAL:  73 y.o.-year-old Caucasian female patient lying in the bed with no acute distress.  EYES: Pupils equal, round, reactive to light and accommodation. No scleral icterus. Extraocular muscles intact.  HEENT: Head atraumatic, normocephalic. Oropharynx and nasopharynx clear.  NECK:  Supple, no jugular venous distention. No thyroid enlargement, no tenderness.  LUNGS: Diminished bibasilar breath sounds with bibasal crackles.. No use of accessory muscles of respiration.  CARDIOVASCULAR: Regular rate and rhythm, S1, S2 normal. No murmurs, rubs, or gallops.  ABDOMEN: Soft, nondistended, nontender. Bowel sounds present. No organomegaly or mass.  EXTREMITIES: No pedal edema, cyanosis, or clubbing.  NEUROLOGIC: Cranial nerves II through XII are intact. Muscle strength 5/5 in all extremities. Sensation intact. Gait not checked.  PSYCHIATRIC: The patient is alert and oriented x 3.  Normal affect and good eye contact.    Condition at discharge: good  The results of significant diagnostics from this hospitalization (including imaging, microbiology, ancillary and laboratory) are listed below for reference.   Imaging Studies: CT CHEST ABDOMEN PELVIS WO CONTRAST Result Date: 11/10/2023 CLINICAL DATA:  Sepsis EXAM: CT CHEST, ABDOMEN AND PELVIS WITHOUT CONTRAST TECHNIQUE: Multidetector CT imaging of the chest, abdomen and pelvis was performed following the standard protocol without IV contrast. RADIATION DOSE REDUCTION: This exam was performed according to the departmental dose-optimization program which includes automated exposure control, adjustment of the mA and/or kV according to patient size  and/or use of iterative reconstruction technique. COMPARISON:  X-ray from earlier in the same day. FINDINGS: CT CHEST FINDINGS Cardiovascular: Somewhat limited due to lack of IV contrast. Atherosclerotic calcifications of the aorta are noted without aneurysmal dilatation. No cardiac enlargement is seen. Mild coronary calcifications are noted. Mediastinum/Nodes: The thoracic inlet is within normal limits. No hilar or mediastinal adenopathy is noted. The esophagus as visualized is within normal limits. Lungs/Pleura: The lungs are well aerated with subpleural fibrotic changes and mild emphysematous change. Pleural and parenchymal scarring is noted bilaterally. Patchy infiltrate is seen within lower lobe similar to that seen on recent chest x-ray consistent with left lower lobe pneumonia. Associated small effusion is noted. No parenchymal nodules are seen. Musculoskeletal: No acute rib abnormality is noted. No compression deformities are noted. Mild degenerative changes of the thoracic spine are seen. CT ABDOMEN PELVIS FINDINGS Hepatobiliary: No focal liver abnormality is seen. No gallstones, gallbladder wall thickening, or biliary dilatation. Pancreas: Unremarkable. No pancreatic ductal dilatation or surrounding inflammatory changes. Spleen: Normal in size without focal abnormality. Adrenals/Urinary Tract: Adrenal glands are within normal limits. The kidneys show no renal calculi or obstructive changes. The bladder is partially distended. Air is noted within the bladder likely related to  recent instrumentation. Stomach/Bowel: Considerable fecal material is noted within the rectum with mild wall thickening consistent with rectal impaction. The more proximal colon shows diffuse retained fecal material consistent with at least moderate colonic constipation. The appendix is within normal limits. Stomach and small bowel are unremarkable. Vascular/Lymphatic: Aortic atherosclerosis. No enlarged abdominal or pelvic lymph  nodes. Reproductive: Uterus and bilateral adnexa are unremarkable. Other: No abdominal wall hernia or abnormality. No abdominopelvic ascites. Musculoskeletal: Degenerative changes of lumbar spine are noted. No acute bony abnormality is seen. IMPRESSION: CT of the chest left lower lobe infiltrate with associated small effusion. CT of the abdomen and pelvis: Findings consistent with rectal impaction and at least moderate, a constipation. Electronically Signed   By: Oneil Devonshire M.D.   On: 11/10/2023 23:59   CT Head Wo Contrast Result Date: 11/10/2023 CLINICAL DATA:  Altered mental status and possible sepsis EXAM: CT HEAD WITHOUT CONTRAST TECHNIQUE: Contiguous axial images were obtained from the base of the skull through the vertex without intravenous contrast. RADIATION DOSE REDUCTION: This exam was performed according to the departmental dose-optimization program which includes automated exposure control, adjustment of the mA and/or kV according to patient size and/or use of iterative reconstruction technique. COMPARISON:  10/11/2023 FINDINGS: Brain: No evidence of acute infarction, hemorrhage, hydrocephalus, extra-axial collection or mass lesion/mass effect. Chronic atrophic and white matter ischemic changes are noted. Vascular: No hyperdense vessel or unexpected calcification. Skull: Normal. Negative for fracture or focal lesion. Sinuses/Orbits: No acute finding. Other: None. IMPRESSION: Chronic atrophic and ischemic changes without acute abnormality. Electronically Signed   By: Oneil Devonshire M.D.   On: 11/10/2023 23:49   DG Chest 1 View Result Date: 11/10/2023 CLINICAL DATA:  Weakness. EXAM: CHEST  1 VIEW COMPARISON:  May 17, 2023 FINDINGS: The heart size and mediastinal contours are within normal limits. The lungs are hyperinflated with evidence of emphysematous lung disease. Diffuse, chronic appearing increased interstitial lung markings are seen. Mild scarring and/or atelectasis is noted within the  bilateral apices and left lung base. No pleural effusion or pneumothorax is identified. The visualized skeletal structures are unremarkable. IMPRESSION: 1. COPD with chronic appearing increased interstitial lung markings. 2. Mild biapical and left basilar scarring and/or atelectasis. Electronically Signed   By: Suzen Dials M.D.   On: 11/10/2023 22:21    Microbiology: Results for orders placed or performed during the hospital encounter of 11/10/23  Resp panel by RT-PCR (RSV, Flu A&B, Covid) Anterior Nasal Swab     Status: None   Collection Time: 11/10/23  9:39 PM   Specimen: Anterior Nasal Swab  Result Value Ref Range Status   SARS Coronavirus 2 by RT PCR NEGATIVE NEGATIVE Final    Comment: (NOTE) SARS-CoV-2 target nucleic acids are NOT DETECTED.  The SARS-CoV-2 RNA is generally detectable in upper respiratory specimens during the acute phase of infection. The lowest concentration of SARS-CoV-2 viral copies this assay can detect is 138 copies/mL. A negative result does not preclude SARS-Cov-2 infection and should not be used as the sole basis for treatment or other patient management decisions. A negative result may occur with  improper specimen collection/handling, submission of specimen other than nasopharyngeal swab, presence of viral mutation(s) within the areas targeted by this assay, and inadequate number of viral copies(<138 copies/mL). A negative result must be combined with clinical observations, patient history, and epidemiological information. The expected result is Negative.  Fact Sheet for Patients:  BloggerCourse.com  Fact Sheet for Healthcare Providers:  SeriousBroker.it  This test is no t  yet approved or cleared by the [place]  FDA and  has been authorized for detection and/or diagnosis of SARS-CoV-2 by FDA under an Emergency Use Authorization (EUA). This EUA will remain  in effect (meaning this test can be  used) for the duration of the COVID-19 declaration under Section 564(b)(1) of the Act, 21 U.S.C.section 360bbb-3(b)(1), unless the authorization is terminated  or revoked sooner.       Influenza A by PCR NEGATIVE NEGATIVE Final   Influenza B by PCR NEGATIVE NEGATIVE Final    Comment: (NOTE) The Xpert Xpress SARS-CoV-2/FLU/RSV plus assay is intended as an aid in the diagnosis of influenza from Nasopharyngeal swab specimens and should not be used as a sole basis for treatment. Nasal washings and aspirates are unacceptable for Xpert Xpress SARS-CoV-2/FLU/RSV testing.  Fact Sheet for Patients: BloggerCourse.com  Fact Sheet for Healthcare Providers: SeriousBroker.it  This test is not yet approved or cleared by the [place]  FDA and has been authorized for detection and/or diagnosis of SARS-CoV-2 by FDA under an Emergency Use Authorization (EUA). This EUA will remain in effect (meaning this test can be used) for the duration of the COVID-19 declaration under Section 564(b)(1) of the Act, 21 U.S.C. section 360bbb-3(b)(1), unless the authorization is terminated or revoked.     Resp Syncytial Virus by PCR NEGATIVE NEGATIVE Final    Comment: (NOTE) Fact Sheet for Patients: BloggerCourse.com  Fact Sheet for Healthcare Providers: SeriousBroker.it  This test is not yet approved or cleared by the [place]  FDA and has been authorized for detection and/or diagnosis of SARS-CoV-2 by FDA under an Emergency Use Authorization (EUA). This EUA will remain in effect (meaning this test can be used) for the duration of the COVID-19 declaration under Section 564(b)(1) of the Act, 21 U.S.C. section 360bbb-3(b)(1), unless the authorization is terminated or revoked.  Performed at [facility], [address] [place], [place] [address]     Labs: CBC: Recent Labs  Lab  11/10/23 2139 11/11/23 0621  WBC 13.1* 9.1  HGB 11.9* 9.3*  HCT 36.4 28.5*  MCV 90.8 89.9  PLT 216 175   Basic Metabolic Panel: Recent Labs  Lab 11/10/23 2139 11/11/23 0621 11/12/23 0947  NA 139 140 138  K 3.2* 3.1* 3.4*  CL 102 108 105  CO2 24 24 24   GLUCOSE 120* 112* 101*  BUN 22 20 11   CREATININE 0.95 0.69 0.74  CALCIUM 9.4 8.4* 8.7*   Liver Function Tests: Recent Labs  Lab 11/10/23 2139  AST 27  ALT 11  ALKPHOS 145*  BILITOT 0.9  PROT 8.5*  ALBUMIN 4.3   CBG: Recent Labs  Lab 11/10/23 2137  GLUCAP 126*    Discharge time spent:  36 minutes.  Signed: Drue ONEIDA Potter, MD [facility] 11/12/2023

## 2023-11-12 NOTE — Progress Notes (Signed)
 PHARMACIST - PHYSICIAN COMMUNICATION  CONCERNING: Antibiotic IV to Oral Route Change Policy  RECOMMENDATION: This patient is receiving azithromycin  by the intravenous route.  Based on criteria approved by the Pharmacy and Therapeutics Committee, the antibiotic(s) is/are being converted to the equivalent oral dose form(s).  DESCRIPTION: These criteria include: Patient being treated for a respiratory tract infection, urinary tract infection, cellulitis or clostridium difficile associated diarrhea if on metronidazole The patient is not neutropenic and does not exhibit a GI malabsorption state The patient is eating (either orally or via tube) and/or has been taking other orally administered medications for a least 24 hours The patient is improving clinically and has a Tmax < 100.5  If you have questions about this conversion, please contact the Pharmacy Department  []   [phone] )  [facility] [x]   [phone] )  [facility] []   [phone] )  [facility] []   [phone] )  [facility] []   [phone] )  [facility]    Thank you for involving pharmacy in this patient's care.   Damien Napoleon, PharmD Clinical Pharmacist 11/12/2023 7:43 AM

## 2023-11-12 NOTE — TOC Transition Note (Signed)
 Transition of Care [facility]) - Discharge Note   Patient Details  Name: Katrina Simon MRN: [id number] Date of Birth: 1950/06/23  Transition of Care [facility]) CM/SW Contact:  Alvaro Louder, LCSW Phone Number: 11/12/2023, 1:17 PM   Clinical Narrative:     [facility] was initiated with patient before admittance The patient's spouse reported that he will take her home at discharge.  Patient and spouse were agreeable. No TOC Needs at this time.  TOC signing off  Final next level of care: Home w Home Health Services Barriers to Discharge: Transportation   Patient Goals and CMS Choice            Discharge Placement                Patient to be transferred to facility by: Spouse Name of family member notified: [name] Patient and family notified of of transfer: 11/12/23  Discharge Plan and Services Additional resources added to the After Visit Summary for                            [facility] Arranged: PT, OT [facility] Agency: [facility] Date [facility] Agency Contacted: 11/12/23   Representative spoke with at [facility] Agency: [place]   Social Drivers of Health (SDOH) Interventions SDOH Screenings   Food Insecurity: No Food Insecurity (11/11/2023)  Housing: Low Risk  (11/11/2023)  Transportation Needs: No Transportation Needs (11/11/2023)  Utilities: Not At Risk (11/11/2023)  Depression (PHQ2-9): Low Risk  (01/05/2023)  Financial Resource Strain: Low Risk  (07/06/2023)   Received from [facility]  Physical Activity: Unknown (07/06/2023)   Received from [facility]  Social Connections: Socially Integrated (11/11/2023)  Stress: No Stress Concern Present (07/06/2023)   Received from [facility]  Tobacco Use: Medium Risk (11/10/2023)     Readmission Risk Interventions    05/18/2023   12:24 PM  Readmission Risk Prevention Plan  Transportation Screening Complete  PCP or Specialist Appt within 3-5 Days Complete  HRI or Home Care Consult Complete  Social Work Consult for Recovery  Care Planning/Counseling Complete  Palliative Care Screening Complete  Medication Review [employer]) Complete

## 2023-11-12 NOTE — Care Management Obs Status (Signed)
 MEDICARE OBSERVATION STATUS NOTIFICATION   Patient Details  Name: Katrina Simon MRN: [id number] Date of Birth: 1950/09/28   Medicare Observation Status Notification Given:  Yes    Randell Teare, LCSW 11/12/2023, 1:16 PM

## 2023-11-12 NOTE — Care Management Obs Status (Signed)
 MEDICARE OBSERVATION STATUS NOTIFICATION   Patient Details  Name: Katrina Simon MRN: [id number] Date of Birth: Nov 27, 1950   Medicare Observation Status Notification Given:  Yes    Vyncent Overby W, CMA 11/12/2023, 10:53 AM

## 2023-11-25 ENCOUNTER — Encounter: Payer: Self-pay | Admitting: Anesthesiology

## 2023-11-25 ENCOUNTER — Ambulatory Visit: Attending: Anesthesiology | Admitting: Anesthesiology

## 2023-11-25 DIAGNOSIS — M47816 Spondylosis without myelopathy or radiculopathy, lumbar region: Secondary | ICD-10-CM

## 2023-11-25 DIAGNOSIS — M797 Fibromyalgia: Secondary | ICD-10-CM

## 2023-11-25 DIAGNOSIS — F1190 Opioid use, unspecified, uncomplicated: Secondary | ICD-10-CM

## 2023-11-25 DIAGNOSIS — F0150 Vascular dementia without behavioral disturbance: Secondary | ICD-10-CM

## 2023-11-25 DIAGNOSIS — M170 Bilateral primary osteoarthritis of knee: Secondary | ICD-10-CM | POA: Diagnosis not present

## 2023-11-25 DIAGNOSIS — M5135 Other intervertebral disc degeneration, thoracolumbar region: Secondary | ICD-10-CM

## 2023-11-25 DIAGNOSIS — N1831 Chronic kidney disease, stage 3a: Secondary | ICD-10-CM

## 2023-11-25 DIAGNOSIS — Z79891 Long term (current) use of opiate analgesic: Secondary | ICD-10-CM

## 2023-11-25 DIAGNOSIS — M150 Primary generalized (osteo)arthritis: Secondary | ICD-10-CM

## 2023-11-25 DIAGNOSIS — G894 Chronic pain syndrome: Secondary | ICD-10-CM | POA: Diagnosis not present

## 2023-11-25 DIAGNOSIS — G309 Alzheimer's disease, unspecified: Secondary | ICD-10-CM

## 2023-11-25 MED ORDER — OXYCODONE HCL 10 MG PO TABS: 10.0000 mg | ORAL_TABLET | Freq: Three times a day (TID) | ORAL | 0 refills | Status: DC

## 2023-11-25 MED ORDER — OXYCODONE HCL 10 MG PO TABS: 10.0000 mg | ORAL_TABLET | Freq: Three times a day (TID) | ORAL | 0 refills | Status: AC

## 2023-11-25 NOTE — Progress Notes (Signed)
 Virtual Visit via Telephone Note  I connected with Katrina Simon on 11/25/23 at  1:20 PM EDT by telephone and verified that I am speaking with the correct person using two identifiers.  Location: Patient: Home Provider: Pain control center   I discussed the limitations, risks, security and privacy concerns of performing an evaluation and management service by telephone and the availability of in person appointments. I also discussed with the patient that there may be a patient responsible charge related to this service. The patient expressed understanding and agreed to proceed.   History of Present Illness: I spoke with Katrina Simon and [place] via telephone.  He suffers from Alzheimer disease and also suffers from chronic low back and lower extremity pain for which she has been opioid dependent for a considerable period of time.  She takes oxycodone  10 mg tablets 3 times a day effectively and describes significant improvement over baseline more conservative care.  She has been on chronic opioid therapy for an extended period of time and her support team including [name] maintains that she appears much more comfortable throughout the day and sleeps better at night while being on chronic opioid therapy.  They have tried to wean her from this regimen at various times with limited success secondary to recurrence of what appears to be severe pain.  With the medication she is well-managed and stable.  They recently traveled to [place]  and she was able to withstand this travel effectively with medication management.  Otherwise she is in her usual state of health as reported by [name] today.  No change in lower extremity strength or function or bowel or bladder function or side effect with her medication is noted.  Review of systems: General: No fevers or chills Pulmonary: No shortness of breath or dyspnea Cardiac: No angina or palpitations or lightheadedness GI: No abdominal pain or  constipation Psych: No depression    Observations/Objective:  Current Outpatient Medications:    [START ON 12/31/2023] Oxycodone  HCl 10 MG TABS, Take 1 tablet (10 mg total) by mouth 3 (three) times daily., Disp: 90 tablet, Rfl: 0   acetaminophen  (TYLENOL ) 325 MG tablet, Take 2 tablets (650 mg total) by mouth every 6 (six) hours as needed for mild pain (pain score 1-3), moderate pain (pain score 4-6), fever or headache (or Fever >/= 101)., Disp: , Rfl:    aspirin  EC 81 MG tablet, Take 81 mg by mouth daily. Swallow whole., Disp: , Rfl:    Cholecalciferol  50 MCG (2000 UT) TABS, Take 2,000 Units by mouth at bedtime., Disp: , Rfl:    diclofenac  Sodium (VOLTAREN ) 1 % GEL, Apply 4 g topically 4 (four) times daily. (Patient taking differently: Apply 4 g topically 4 (four) times daily as needed.), Disp: 100 g, Rfl: 3   folic acid  (FOLVITE ) 400 MCG tablet, Take 400 mcg by mouth at bedtime., Disp: , Rfl:    ipratropium-albuterol  (DUONEB) 0.5-2.5 (3) MG/3ML SOLN, Inhale 3 mLs into the lungs every 6 (six) hours as needed., Disp: , Rfl:    lidocaine  (LIDODERM ) 5 %, Place 1 patch onto the skin daily as needed. (Patient not taking: Reported on 11/11/2023), Disp: , Rfl:    magnesium  hydroxide (MILK OF MAGNESIA) 400 MG/5ML suspension, Take 30 mLs by mouth daily as needed for mild constipation., Disp: 355 mL, Rfl: 0   naloxone  (NARCAN ) nasal spray 4 mg/0.1 mL, As directed for opioid induced respiratory depression, Disp: 1 each, Rfl: 1   [START ON 12/01/2023] Oxycodone  HCl 10 MG  TABS, Take 1 tablet (10 mg total) by mouth 3 (three) times daily., Disp: 90 tablet, Rfl: 0   pantoprazole  (PROTONIX ) 40 MG tablet, Take by mouth., Disp: , Rfl:    rivastigmine  (EXELON ) 1.5 MG capsule, Take 1.5 mg by mouth 2 (two) times daily., Disp: , Rfl:    sertraline  (ZOLOFT ) 100 MG tablet, Take 100 mg by mouth at bedtime., Disp: , Rfl:    TRELEGY ELLIPTA  200-62.5-25 MCG/ACT AEPB, Inhale 1 puff into the lungs daily., Disp: , Rfl:     Past Medical History:  Diagnosis Date   Achalasia    Allergy    Arthritis    Depression    Emphysema of lung (HCC)    Fibromyalgia    Gastroesophageal reflux disease with esophagitis 01/26/2020   Formatting of this note might be different from the original. LA Grade C noted on EGD 01/2020   GERD (gastroesophageal reflux disease)    Herpes genitalis    History of chicken pox    History of kidney stones    Assessment and Plan:  1. Primary osteoarthritis of both knees   2. Primary osteoarthritis involving multiple joints   3. Fibromyalgia   4. Chronic, continuous use of opioids   5. Chronic pain syndrome   6. Facet syndrome, lumbar   7. Mixed Alzheimer's and vascular dementia (HCC)   8. Chronic kidney disease, stage 3a (HCC)   9. DDD (degenerative disc disease), thoracolumbar    Based on our conversation and upon review of the [place]  practitioner database information that is appropriate to keep her on this medication.  We have tried efforts at diminishing dosing in the past but these have been unsuccessful as her care team maintains that she appears to be quite uncomfortable during the day and at night however with her current regimen she appears comfortable during the day and able to effectively sleep better at night.  We will continue current management with refill generated for September 16 and October 16 no other changes are initiated.  Continue follow-up with her primary care physician for baseline medical care.  Will have her return to clinic in 2 months. Follow Up Instructions:    I discussed the assessment and treatment plan with the patient. The patient was provided an opportunity to ask questions and all were answered. The patient agreed with the plan and demonstrated an understanding of the instructions.   The patient was advised to call back or seek an in-person evaluation if the symptoms worsen or if the condition fails to improve as anticipated.  I provided  30 minutes of non-face-to-face time during this encounter.   Lynwood KANDICE Clause, MD

## 2024-01-25 ENCOUNTER — Ambulatory Visit: Attending: Anesthesiology | Admitting: Anesthesiology

## 2024-01-25 ENCOUNTER — Encounter: Payer: Self-pay | Admitting: Anesthesiology

## 2024-01-25 VITALS — BP 128/77 | HR 69 | Temp 97.3°F | Resp 16 | Ht 66.0 in | Wt 105.0 lb

## 2024-01-25 DIAGNOSIS — G309 Alzheimer's disease, unspecified: Secondary | ICD-10-CM | POA: Diagnosis present

## 2024-01-25 DIAGNOSIS — N1831 Chronic kidney disease, stage 3a: Secondary | ICD-10-CM

## 2024-01-25 DIAGNOSIS — F1190 Opioid use, unspecified, uncomplicated: Secondary | ICD-10-CM | POA: Insufficient documentation

## 2024-01-25 DIAGNOSIS — M150 Primary generalized (osteo)arthritis: Secondary | ICD-10-CM | POA: Diagnosis present

## 2024-01-25 DIAGNOSIS — M170 Bilateral primary osteoarthritis of knee: Secondary | ICD-10-CM | POA: Diagnosis present

## 2024-01-25 DIAGNOSIS — F0280 Dementia in other diseases classified elsewhere without behavioral disturbance: Secondary | ICD-10-CM | POA: Insufficient documentation

## 2024-01-25 DIAGNOSIS — M47816 Spondylosis without myelopathy or radiculopathy, lumbar region: Secondary | ICD-10-CM | POA: Diagnosis present

## 2024-01-25 DIAGNOSIS — M797 Fibromyalgia: Secondary | ICD-10-CM | POA: Insufficient documentation

## 2024-01-25 DIAGNOSIS — G894 Chronic pain syndrome: Secondary | ICD-10-CM | POA: Diagnosis not present

## 2024-01-25 DIAGNOSIS — F0150 Vascular dementia without behavioral disturbance: Secondary | ICD-10-CM | POA: Diagnosis present

## 2024-01-25 DIAGNOSIS — Z79891 Long term (current) use of opiate analgesic: Secondary | ICD-10-CM

## 2024-01-25 MED ORDER — OXYCODONE HCL 10 MG PO TABS: 10.0000 mg | ORAL_TABLET | Freq: Three times a day (TID) | ORAL | 0 refills | Status: AC

## 2024-01-25 MED ORDER — OXYCODONE HCL 10 MG PO TABS: 10.0000 mg | ORAL_TABLET | Freq: Three times a day (TID) | ORAL | 0 refills | Status: DC

## 2024-01-25 NOTE — Progress Notes (Unsigned)
 Nursing Pain Medication Assessment:  Safety precautions to be maintained throughout the outpatient stay will include: orient to surroundings, keep bed in low position, maintain call bell within reach at all times, provide assistance with transfer out of bed and ambulation.  Medication Inspection Compliance: Katrina Simon did not comply with our request to bring her pills to be counted. She was reminded that bringing the medication bottles, even when empty, is a requirement.  Medication: None brought in. Pill/Patch Count: None available to be counted. Bottle Appearance: No container available. Did not bring bottle(s) to appointment. Filled Date: N/A Last Medication intake:  Today

## 2024-01-28 LAB — TOXASSURE SELECT 13 (MW), URINE

## 2024-02-16 NOTE — Progress Notes (Unsigned)
 Subjective:  Patient ID: Katrina Simon, female    DOB: [birth year]  Age: 73 y.o. MRN: [id number]  CC: Back Pain   Procedure: None  HPI Katrina Simon presents for reevaluation.  Her last evaluation was 2 months ago and she presents today with her husband.  She continues to suffer from advanced stage dementia and has required chronic opioid therapy for persistent osteoarthritis and intractable pain.  The quality characteristic and distribution appears to be stable though she is a poor historian and it is difficult to assess this.  Her husband takes care of her as the primary caretaker and reports that without opioid management she moans throughout the day and does not sleep at night.  With her medications as taken per prescription she appears to be much more comfortable and sleeps better at night with no evidence of any side effects or untoward effect from the medicines.  Otherwise she appears to be stable.  Unfortunately she has failed more conservative therapy but continues to respond favorably to the medications.  Outpatient Medications Prior to Visit  Medication Sig Dispense Refill   acetaminophen  (TYLENOL ) 325 MG tablet Take 2 tablets (650 mg total) by mouth every 6 (six) hours as needed for mild pain (pain score 1-3), moderate pain (pain score 4-6), fever or headache (or Fever >/= 101).     aspirin  EC 81 MG tablet Take 81 mg by mouth daily. Swallow whole.     Cholecalciferol  50 MCG (2000 UT) TABS Take 2,000 Units by mouth at bedtime.     diclofenac  Sodium (VOLTAREN ) 1 % GEL Apply 4 g topically 4 (four) times daily. (Patient taking differently: Apply 4 g topically 4 (four) times daily as needed.) 100 g 3   folic acid  (FOLVITE ) 400 MCG tablet Take 400 mcg by mouth at bedtime.     ipratropium-albuterol  (DUONEB) 0.5-2.5 (3) MG/3ML SOLN Inhale 3 mLs into the lungs every 6 (six) hours as needed.     lidocaine  (LIDODERM ) 5 % Place 1 patch onto the skin daily as needed.     magnesium  hydroxide  (MILK OF MAGNESIA) 400 MG/5ML suspension Take 30 mLs by mouth daily as needed for mild constipation. 355 mL 0   naloxone  (NARCAN ) nasal spray 4 mg/0.1 mL As directed for opioid induced respiratory depression 1 each 1   pantoprazole  (PROTONIX ) 40 MG tablet Take by mouth.     rivastigmine  (EXELON ) 1.5 MG capsule Take 1.5 mg by mouth 2 (two) times daily.     sertraline  (ZOLOFT ) 100 MG tablet Take 100 mg by mouth at bedtime. (Patient taking differently: Take 150 mg by mouth at bedtime.)     TRELEGY ELLIPTA  200-62.5-25 MCG/ACT AEPB Inhale 1 puff into the lungs daily.     Oxycodone  HCl 10 MG TABS Take 1 tablet (10 mg total) by mouth 3 (three) times daily. 90 tablet 0   No facility-administered medications prior to visit.    Review of Systems CNS: No confusion or sedation Cardiac: No angina or palpitations GI: No abdominal pain or constipation Constitutional: No nausea vomiting fevers or chills  Objective:  BP 128/77   Pulse 69   Temp (!) 97.3 F (36.3 C)   Resp 16   Ht 5' 6 (1.676 m)   Wt 105 lb (47.6 kg)   LMP  (LMP Unknown)   SpO2 99%   BMI 16.95 kg/m    BP Readings from Last 3 Encounters:  01/25/24 128/77  11/12/23 116/73  10/11/23 123/74  Wt Readings from Last 3 Encounters:  01/25/24 105 lb (47.6 kg)  11/11/23 104 lb 8 oz (47.4 kg)  10/11/23 105 lb (47.6 kg)     Physical Exam Pt is alert and oriented PERRL EOMI HEART IS RRR no murmur or rub LCTA no wheezing or rales MUSCULOSKELETAL reveals some paraspinous muscle tenderness in the low back and neck but no overt trigger points.  She is in a wheelchair today.  Muscle tone and bulk appears to be at baseline  Labs  No results found for: HGBA1C Lab Results  Component Value Date   CREATININE 0.74 11/12/2023    -------------------------------------------------------------------------------------------------------------------- Lab Results  Component Value Date   WBC 9.1 11/11/2023   HGB 9.3 (L) 11/11/2023    HCT 28.5 (L) 11/11/2023   PLT 175 11/11/2023   GLUCOSE 101 (H) 11/12/2023   ALT 11 11/10/2023   AST 27 11/10/2023   NA 138 11/12/2023   K 3.4 (L) 11/12/2023   CL 105 11/12/2023   CREATININE 0.74 11/12/2023   BUN 11 11/12/2023   CO2 24 11/12/2023   TSH 0.304 (L) 07/26/2020   INR 1.2 11/11/2023    --------------------------------------------------------------------------------------------------------------------- CT CHEST ABDOMEN PELVIS WO CONTRAST Result Date: 11/10/2023 CLINICAL DATA:  Sepsis EXAM: CT CHEST, ABDOMEN AND PELVIS WITHOUT CONTRAST TECHNIQUE: Multidetector CT imaging of the chest, abdomen and pelvis was performed following the standard protocol without IV contrast. RADIATION DOSE REDUCTION: This exam was performed according to the departmental dose-optimization program which includes automated exposure control, adjustment of the mA and/or kV according to patient size and/or use of iterative reconstruction technique. COMPARISON:  X-ray from earlier in the same day. FINDINGS: CT CHEST FINDINGS Cardiovascular: Somewhat limited due to lack of IV contrast. Atherosclerotic calcifications of the aorta are noted without aneurysmal dilatation. No cardiac enlargement is seen. Mild coronary calcifications are noted. Mediastinum/Nodes: The thoracic inlet is within normal limits. No hilar or mediastinal adenopathy is noted. The esophagus as visualized is within normal limits. Lungs/Pleura: The lungs are well aerated with subpleural fibrotic changes and mild emphysematous change. Pleural and parenchymal scarring is noted bilaterally. Patchy infiltrate is seen within lower lobe similar to that seen on recent chest x-ray consistent with left lower lobe pneumonia. Associated small effusion is noted. No parenchymal nodules are seen. Musculoskeletal: No acute rib abnormality is noted. No compression deformities are noted. Mild degenerative changes of the thoracic spine are seen. CT ABDOMEN PELVIS  FINDINGS Hepatobiliary: No focal liver abnormality is seen. No gallstones, gallbladder wall thickening, or biliary dilatation. Pancreas: Unremarkable. No pancreatic ductal dilatation or surrounding inflammatory changes. Spleen: Normal in size without focal abnormality. Adrenals/Urinary Tract: Adrenal glands are within normal limits. The kidneys show no renal calculi or obstructive changes. The bladder is partially distended. Air is noted within the bladder likely related to recent instrumentation. Stomach/Bowel: Considerable fecal material is noted within the rectum with mild wall thickening consistent with rectal impaction. The more proximal colon shows diffuse retained fecal material consistent with at least moderate colonic constipation. The appendix is within normal limits. Stomach and small bowel are unremarkable. Vascular/Lymphatic: Aortic atherosclerosis. No enlarged abdominal or pelvic lymph nodes. Reproductive: Uterus and bilateral adnexa are unremarkable. Other: No abdominal wall hernia or abnormality. No abdominopelvic ascites. Musculoskeletal: Degenerative changes of lumbar spine are noted. No acute bony abnormality is seen. IMPRESSION: CT of the chest left lower lobe infiltrate with associated small effusion. CT of the abdomen and pelvis: Findings consistent with rectal impaction and at least moderate, a constipation. Electronically Signed  By: Oneil Devonshire M.D.   On: 11/10/2023 23:59   CT Head Wo Contrast Result Date: 11/10/2023 CLINICAL DATA:  Altered mental status and possible sepsis EXAM: CT HEAD WITHOUT CONTRAST TECHNIQUE: Contiguous axial images were obtained from the base of the skull through the vertex without intravenous contrast. RADIATION DOSE REDUCTION: This exam was performed according to the departmental dose-optimization program which includes automated exposure control, adjustment of the mA and/or kV according to patient size and/or use of iterative reconstruction technique.  COMPARISON:  10/11/2023 FINDINGS: Brain: No evidence of acute infarction, hemorrhage, hydrocephalus, extra-axial collection or mass lesion/mass effect. Chronic atrophic and white matter ischemic changes are noted. Vascular: No hyperdense vessel or unexpected calcification. Skull: Normal. Negative for fracture or focal lesion. Sinuses/Orbits: No acute finding. Other: None. IMPRESSION: Chronic atrophic and ischemic changes without acute abnormality. Electronically Signed   By: Oneil Devonshire M.D.   On: 11/10/2023 23:49   DG Chest 1 View Result Date: 11/10/2023 CLINICAL DATA:  Weakness. EXAM: CHEST  1 VIEW COMPARISON:  May 17, 2023 FINDINGS: The heart size and mediastinal contours are within normal limits. The lungs are hyperinflated with evidence of emphysematous lung disease. Diffuse, chronic appearing increased interstitial lung markings are seen. Mild scarring and/or atelectasis is noted within the bilateral apices and left lung base. No pleural effusion or pneumothorax is identified. The visualized skeletal structures are unremarkable. IMPRESSION: 1. COPD with chronic appearing increased interstitial lung markings. 2. Mild biapical and left basilar scarring and/or atelectasis. Electronically Signed   By: Suzen Dials M.D.   On: 11/10/2023 22:21     Assessment & Plan:   Rosezetta was seen today for back pain.  Diagnoses and all orders for this visit:  Primary osteoarthritis of both knees  Primary osteoarthritis involving multiple joints  Fibromyalgia  Facet syndrome, lumbar  Chronic pain syndrome -     ToxASSURE Select 13 (MW), Urine  Chronic, continuous use of opioids -     ToxASSURE Select 13 (MW), Urine  Mixed Alzheimer's and vascular dementia (HCC)  Chronic kidney disease, stage 3a (HCC)  Other orders -     Oxycodone  HCl 10 MG TABS; Take 1 tablet (10 mg total) by mouth 3 (three) times daily. -     Oxycodone  HCl 10 MG TABS; Take 1 tablet (10 mg total) by mouth in the morning, at  noon, and at bedtime.        ----------------------------------------------------------------------------------------------------------------------  Problem List Items Addressed This Visit       Unprioritized   Chronic kidney disease, stage 3a (HCC)   Chronic pain syndrome   Relevant Medications   Oxycodone  HCl 10 MG TABS   Oxycodone  HCl 10 MG TABS (Start on 03/01/2024)   Other Relevant Orders   ToxASSURE Select 13 (MW), Urine (Completed)   Chronic, continuous use of opioids   Relevant Orders   ToxASSURE Select 13 (MW), Urine (Completed)   DJD (degenerative joint disease) shoulder, sacroiliac joint  - Primary   Relevant Medications   Oxycodone  HCl 10 MG TABS   Oxycodone  HCl 10 MG TABS (Start on 03/01/2024)   Facet syndrome, lumbar   Relevant Medications   Oxycodone  HCl 10 MG TABS   Oxycodone  HCl 10 MG TABS (Start on 03/01/2024)   Fibromyalgia   Relevant Medications   Oxycodone  HCl 10 MG TABS   Oxycodone  HCl 10 MG TABS (Start on 03/01/2024)   Mixed Alzheimer's and vascular dementia (HCC)      ----------------------------------------------------------------------------------------------------------------------  1. Primary osteoarthritis of both knees (  Primary) Will continue with her current therapy.  She appears to be tolerant of this with good relief as reported by the husband no other changes are initiated today.  2. Primary osteoarthritis involving multiple joints As above  3. Fibromyalgia As above  4. Facet syndrome, lumbar As above  5. Chronic pain syndrome I have reviewed the Mel[employer]  practitioner database information and it is appropriate to continue with current management. - ToxASSURE Select 13 (MW), Urine  6. Chronic, continuous use of opioids As above - ToxASSURE Select 13 (MW), Urine  7. Mixed Alzheimer's and vascular dementia (HCC)   8. Chronic kidney disease, stage 3a  (HCC)     ----------------------------------------------------------------------------------------------------------------------  I am having Katrina T. Stolze start on Oxycodone  HCl. I am also having her maintain her diclofenac  Sodium, aspirin  EC, lidocaine , Cholecalciferol , folic acid , Trelegy Ellipta , ipratropium-albuterol , pantoprazole , rivastigmine , sertraline , acetaminophen , naloxone , magnesium  hydroxide, and Oxycodone  HCl.   Meds ordered this encounter  Medications   Oxycodone  HCl 10 MG TABS    Sig: Take 1 tablet (10 mg total) by mouth 3 (three) times daily.    Dispense:  90 tablet    Refill:  0   Oxycodone  HCl 10 MG TABS    Sig: Take 1 tablet (10 mg total) by mouth in the morning, at noon, and at bedtime.    Dispense:  90 tablet    Refill:  0   Patient's Medications  New Prescriptions   OXYCODONE  HCL 10 MG TABS    Take 1 tablet (10 mg total) by mouth in the morning, at noon, and at bedtime.  Previous Medications   ACETAMINOPHEN  (TYLENOL ) 325 MG TABLET    Take 2 tablets (650 mg total) by mouth every 6 (six) hours as needed for mild pain (pain score 1-3), moderate pain (pain score 4-6), fever or headache (or Fever >/= 101).   ASPIRIN  EC 81 MG TABLET    Take 81 mg by mouth daily. Swallow whole.   CHOLECALCIFEROL  50 MCG (2000 UT) TABS    Take 2,000 Units by mouth at bedtime.   DICLOFENAC  SODIUM (VOLTAREN ) 1 % GEL    Apply 4 g topically 4 (four) times daily.   FOLIC ACID  (FOLVITE ) 400 MCG TABLET    Take 400 mcg by mouth at bedtime.   IPRATROPIUM-ALBUTEROL  (DUONEB) 0.5-2.5 (3) MG/3ML SOLN    Inhale 3 mLs into the lungs every 6 (six) hours as needed.   LIDOCAINE  (LIDODERM ) 5 %    Place 1 patch onto the skin daily as needed.   MAGNESIUM  HYDROXIDE (MILK OF MAGNESIA) 400 MG/5ML SUSPENSION    Take 30 mLs by mouth daily as needed for mild constipation.   NALOXONE  (NARCAN ) NASAL SPRAY 4 MG/0.1 ML    As directed for opioid induced respiratory depression   PANTOPRAZOLE  (PROTONIX ) 40 MG  TABLET    Take by mouth.   RIVASTIGMINE  (EXELON ) 1.5 MG CAPSULE    Take 1.5 mg by mouth 2 (two) times daily.   SERTRALINE  (ZOLOFT ) 100 MG TABLET    Take 100 mg by mouth at bedtime.   TRELEGY ELLIPTA  200-62.5-25 MCG/ACT AEPB    Inhale 1 puff into the lungs daily.  Modified Medications   Modified Medication Previous Medication   OXYCODONE  HCL 10 MG TABS Oxycodone  HCl 10 MG TABS      Take 1 tablet (10 mg total) by mouth 3 (three) times daily.    Take 1 tablet (10 mg total) by mouth 3 (three) times daily.  Discontinued Medications  No medications on file   ----------------------------------------------------------------------------------------------------------------------  Follow-up: Return in about 2 months (around 03/26/2024) for evaluation, med refill.  Continue follow-up with primary care team for baseline medical care with return to clinic scheduled in 2 months.  Lynwood KANDICE Clause, MD

## 2024-03-30 ENCOUNTER — Ambulatory Visit: Attending: Anesthesiology | Admitting: Anesthesiology

## 2024-03-30 ENCOUNTER — Encounter: Payer: Self-pay | Admitting: Anesthesiology

## 2024-03-30 DIAGNOSIS — M797 Fibromyalgia: Secondary | ICD-10-CM | POA: Diagnosis not present

## 2024-03-30 DIAGNOSIS — M170 Bilateral primary osteoarthritis of knee: Secondary | ICD-10-CM | POA: Diagnosis not present

## 2024-03-30 DIAGNOSIS — M150 Primary generalized (osteo)arthritis: Secondary | ICD-10-CM

## 2024-03-30 DIAGNOSIS — M5135 Other intervertebral disc degeneration, thoracolumbar region: Secondary | ICD-10-CM | POA: Diagnosis not present

## 2024-03-30 DIAGNOSIS — G894 Chronic pain syndrome: Secondary | ICD-10-CM

## 2024-03-30 DIAGNOSIS — N1831 Chronic kidney disease, stage 3a: Secondary | ICD-10-CM

## 2024-03-30 DIAGNOSIS — F1190 Opioid use, unspecified, uncomplicated: Secondary | ICD-10-CM

## 2024-03-30 DIAGNOSIS — M47816 Spondylosis without myelopathy or radiculopathy, lumbar region: Secondary | ICD-10-CM

## 2024-03-30 DIAGNOSIS — Z79891 Long term (current) use of opiate analgesic: Secondary | ICD-10-CM | POA: Diagnosis not present

## 2024-03-30 MED ORDER — OXYCODONE HCL 10 MG PO TABS: 10.0000 mg | ORAL_TABLET | Freq: Three times a day (TID) | ORAL | 0 refills | Status: AC

## 2024-04-04 NOTE — Progress Notes (Signed)
 Virtual Visit via Telephone Note  I connected with Katrina Simon on 04/04/24 at  3:20 PM EST by telephone and verified that I am speaking with the correct person using two identifiers.  Location: Patient: Home Provider: [facility] control [facility]   I discussed the limitations, risks, security and privacy concerns of performing an evaluation and management service by telephone and the availability of in person appointments. I also discussed with the patient that there may be a patient responsible charge related to this service. The patient expressed understanding and agreed to proceed.   History of Present Illness: Spoke with Katrina Simon and her husband via telephone for a virtual visit.  Husband reports that she is doing reasonably well with pain management.  She is still taking her medications as prescribed.  She continues to get good relief as reported by her husband with these.  Unfortunately she suffers from moderate to severe dementia but appears much more comfortable when taking her current medication regimen.  When she is off this regimen she is reportedly in duress and has difficulty sleeping at night.  Unfortunately she has failed more conservative therapy and has been on this regimen for an extended period time with good success.  No reported side effects are noted.  It remains difficulty to evaluate the source of her pain secondary to the severe and progressive dementia.  Review of systems: General: No fevers or chills Pulmonary: No shortness of breath or dyspnea Cardiac: No angina or palpitations or lightheadedness GI: No abdominal pain or constipation Psych: No depression    Observations/Objective: Current Medications[1]  Past Medical History:  Diagnosis Date   Achalasia    Allergy    Arthritis    Depression    Emphysema of lung (HCC)    Fibromyalgia    Gastroesophageal reflux disease with esophagitis 01/26/2020   Formatting of this note might be different from the original.  LA Grade C noted on EGD 01/2020   GERD (gastroesophageal reflux disease)    Herpes genitalis    History of chicken pox    History of kidney stones     Assessment and Plan: 1. Primary osteoarthritis of both knees   2. Primary osteoarthritis involving multiple joints   3. Fibromyalgia   4. Facet syndrome, lumbar   5. Chronic pain syndrome   6. Chronic, continuous use of opioids   7. Chronic kidney disease, stage 3a (HCC)   8. DDD (degenerative disc disease), thoracolumbar    Based on my conversation with [name] her husband and caretaker, she continues to do well with her current regimen.  No side effects are reported with the medication and she continues to appear more comfortable and at peace with the medication management.  She sleeps better while taking this.  She was on chronic opioid therapy secondary to pain prior to the advancing of the dementia.  Unfortunately she has failed more conservative therapy and they noticed a significant difference in her comfort level and sleep pattern if she is not taking her chronic opioid therapy.  I have encouraged her to continue follow-up with her primary care physicians for baseline medical care will schedule II month return.  Follow Up Instructions:    I discussed the assessment and treatment plan with the patient. The patient was provided an opportunity to ask questions and all were answered. The patient agreed with the plan and demonstrated an understanding of the instructions.   The patient was advised to call back or seek an in-person evaluation if  the symptoms worsen or if the condition fails to improve as anticipated.  I provided 30 minutes of non-face-to-face time during this encounter.   Lynwood KANDICE Clause, MD     [1]  Current Outpatient Medications:    [START ON 04/30/2024] Oxycodone  HCl 10 MG TABS, Take 1 tablet (10 mg total) by mouth in the morning, at noon, and at bedtime., Disp: 90 tablet, Rfl: 0   acetaminophen  (TYLENOL ) 325 MG  tablet, Take 2 tablets (650 mg total) by mouth every 6 (six) hours as needed for mild pain (pain score 1-3), moderate pain (pain score 4-6), fever or headache (or Fever >/= 101)., Disp: , Rfl:    aspirin  EC 81 MG tablet, Take 81 mg by mouth daily. Swallow whole., Disp: , Rfl:    Cholecalciferol  50 MCG (2000 UT) TABS, Take 2,000 Units by mouth at bedtime., Disp: , Rfl:    diclofenac  Sodium (VOLTAREN ) 1 % GEL, Apply 4 g topically 4 (four) times daily. (Patient taking differently: Apply 4 g topically 4 (four) times daily as needed.), Disp: 100 g, Rfl: 3   folic acid  (FOLVITE ) 400 MCG tablet, Take 400 mcg by mouth at bedtime., Disp: , Rfl:    ipratropium-albuterol  (DUONEB) 0.5-2.5 (3) MG/3ML SOLN, Inhale 3 mLs into the lungs every 6 (six) hours as needed., Disp: , Rfl:    lidocaine  (LIDODERM ) 5 %, Place 1 patch onto the skin daily as needed., Disp: , Rfl:    magnesium  hydroxide (MILK OF MAGNESIA) 400 MG/5ML suspension, Take 30 mLs by mouth daily as needed for mild constipation., Disp: 355 mL, Rfl: 0   naloxone  (NARCAN ) nasal spray 4 mg/0.1 mL, As directed for opioid induced respiratory depression, Disp: 1 each, Rfl: 1   Oxycodone  HCl 10 MG TABS, Take 1 tablet (10 mg total) by mouth in the morning, at noon, and at bedtime., Disp: 90 tablet, Rfl: 0   pantoprazole  (PROTONIX ) 40 MG tablet, Take by mouth., Disp: , Rfl:    rivastigmine  (EXELON ) 1.5 MG capsule, Take 1.5 mg by mouth 2 (two) times daily., Disp: , Rfl:    sertraline  (ZOLOFT ) 100 MG tablet, Take 100 mg by mouth at bedtime. (Patient taking differently: Take 150 mg by mouth at bedtime.), Disp: , Rfl:    TRELEGY ELLIPTA  200-62.5-25 MCG/ACT AEPB, Inhale 1 puff into the lungs daily., Disp: , Rfl:

## 2024-04-15 ENCOUNTER — Encounter: Payer: Self-pay | Admitting: Internal Medicine

## 2024-04-15 ENCOUNTER — Observation Stay
Admission: EM | Admit: 2024-04-15 | Discharge: 2024-04-20 | DRG: 193 | Disposition: A | Attending: Internal Medicine | Admitting: Internal Medicine

## 2024-04-15 ENCOUNTER — Other Ambulatory Visit: Payer: Self-pay

## 2024-04-15 ENCOUNTER — Emergency Department

## 2024-04-15 DIAGNOSIS — B961 Klebsiella pneumoniae [K. pneumoniae] as the cause of diseases classified elsewhere: Secondary | ICD-10-CM | POA: Diagnosis present

## 2024-04-15 DIAGNOSIS — Z1611 Resistance to penicillins: Secondary | ICD-10-CM | POA: Diagnosis present

## 2024-04-15 DIAGNOSIS — Z8261 Family history of arthritis: Secondary | ICD-10-CM

## 2024-04-15 DIAGNOSIS — K2270 Barrett's esophagus without dysplasia: Secondary | ICD-10-CM | POA: Diagnosis present

## 2024-04-15 DIAGNOSIS — Z87898 Personal history of other specified conditions: Secondary | ICD-10-CM

## 2024-04-15 DIAGNOSIS — Z79891 Long term (current) use of opiate analgesic: Secondary | ICD-10-CM

## 2024-04-15 DIAGNOSIS — R051 Acute cough: Secondary | ICD-10-CM

## 2024-04-15 DIAGNOSIS — K220 Achalasia of cardia: Secondary | ICD-10-CM | POA: Diagnosis present

## 2024-04-15 DIAGNOSIS — Z888 Allergy status to other drugs, medicaments and biological substances status: Secondary | ICD-10-CM

## 2024-04-15 DIAGNOSIS — Z681 Body mass index (BMI) 19 or less, adult: Secondary | ICD-10-CM

## 2024-04-15 DIAGNOSIS — F419 Anxiety disorder, unspecified: Secondary | ICD-10-CM | POA: Diagnosis present

## 2024-04-15 DIAGNOSIS — Z79899 Other long term (current) drug therapy: Secondary | ICD-10-CM

## 2024-04-15 DIAGNOSIS — E870 Hyperosmolality and hypernatremia: Secondary | ICD-10-CM | POA: Diagnosis present

## 2024-04-15 DIAGNOSIS — R1310 Dysphagia, unspecified: Secondary | ICD-10-CM | POA: Diagnosis present

## 2024-04-15 DIAGNOSIS — M797 Fibromyalgia: Secondary | ICD-10-CM | POA: Diagnosis present

## 2024-04-15 DIAGNOSIS — Z7982 Long term (current) use of aspirin: Secondary | ICD-10-CM

## 2024-04-15 DIAGNOSIS — D72829 Elevated white blood cell count, unspecified: Secondary | ICD-10-CM

## 2024-04-15 DIAGNOSIS — I959 Hypotension, unspecified: Secondary | ICD-10-CM | POA: Diagnosis present

## 2024-04-15 DIAGNOSIS — R64 Cachexia: Secondary | ICD-10-CM | POA: Diagnosis present

## 2024-04-15 DIAGNOSIS — N390 Urinary tract infection, site not specified: Secondary | ICD-10-CM | POA: Diagnosis present

## 2024-04-15 DIAGNOSIS — J121 Respiratory syncytial virus pneumonia: Secondary | ICD-10-CM | POA: Diagnosis not present

## 2024-04-15 DIAGNOSIS — F0280 Dementia in other diseases classified elsewhere without behavioral disturbance: Secondary | ICD-10-CM | POA: Diagnosis present

## 2024-04-15 DIAGNOSIS — J189 Pneumonia, unspecified organism: Secondary | ICD-10-CM

## 2024-04-15 DIAGNOSIS — J9601 Acute respiratory failure with hypoxia: Secondary | ICD-10-CM | POA: Diagnosis present

## 2024-04-15 DIAGNOSIS — Z8619 Personal history of other infectious and parasitic diseases: Secondary | ICD-10-CM

## 2024-04-15 DIAGNOSIS — Z1152 Encounter for screening for COVID-19: Secondary | ICD-10-CM

## 2024-04-15 DIAGNOSIS — J210 Acute bronchiolitis due to respiratory syncytial virus: Secondary | ICD-10-CM | POA: Diagnosis present

## 2024-04-15 DIAGNOSIS — K219 Gastro-esophageal reflux disease without esophagitis: Secondary | ICD-10-CM | POA: Diagnosis present

## 2024-04-15 DIAGNOSIS — G894 Chronic pain syndrome: Secondary | ICD-10-CM | POA: Diagnosis present

## 2024-04-15 DIAGNOSIS — G309 Alzheimer's disease, unspecified: Secondary | ICD-10-CM | POA: Diagnosis present

## 2024-04-15 DIAGNOSIS — Z8249 Family history of ischemic heart disease and other diseases of the circulatory system: Secondary | ICD-10-CM

## 2024-04-15 DIAGNOSIS — Z87442 Personal history of urinary calculi: Secondary | ICD-10-CM

## 2024-04-15 DIAGNOSIS — J439 Emphysema, unspecified: Secondary | ICD-10-CM | POA: Diagnosis present

## 2024-04-15 DIAGNOSIS — J441 Chronic obstructive pulmonary disease with (acute) exacerbation: Secondary | ICD-10-CM | POA: Diagnosis present

## 2024-04-15 DIAGNOSIS — E43 Unspecified severe protein-calorie malnutrition: Secondary | ICD-10-CM | POA: Diagnosis present

## 2024-04-15 DIAGNOSIS — F1190 Opioid use, unspecified, uncomplicated: Secondary | ICD-10-CM | POA: Diagnosis present

## 2024-04-15 DIAGNOSIS — M1990 Unspecified osteoarthritis, unspecified site: Secondary | ICD-10-CM | POA: Diagnosis present

## 2024-04-15 DIAGNOSIS — N179 Acute kidney failure, unspecified: Secondary | ICD-10-CM | POA: Diagnosis present

## 2024-04-15 DIAGNOSIS — Z87891 Personal history of nicotine dependence: Secondary | ICD-10-CM

## 2024-04-15 DIAGNOSIS — Z880 Allergy status to penicillin: Secondary | ICD-10-CM

## 2024-04-15 DIAGNOSIS — J440 Chronic obstructive pulmonary disease with acute lower respiratory infection: Secondary | ICD-10-CM | POA: Diagnosis present

## 2024-04-15 DIAGNOSIS — F0150 Vascular dementia without behavioral disturbance: Secondary | ICD-10-CM | POA: Diagnosis present

## 2024-04-15 DIAGNOSIS — F32A Depression, unspecified: Secondary | ICD-10-CM | POA: Diagnosis present

## 2024-04-15 DIAGNOSIS — J988 Other specified respiratory disorders: Principal | ICD-10-CM

## 2024-04-15 DIAGNOSIS — Z7951 Long term (current) use of inhaled steroids: Secondary | ICD-10-CM

## 2024-04-15 LAB — COMPREHENSIVE METABOLIC PANEL WITH GFR
ALT: 7 U/L (ref 0–44)
AST: 15 U/L (ref 15–41)
Albumin: 3.6 g/dL (ref 3.5–5.0)
Alkaline Phosphatase: 112 U/L (ref 38–126)
Anion gap: 14 (ref 5–15)
BUN: 50 mg/dL — ABNORMAL HIGH (ref 8–23)
CO2: 24 mmol/L (ref 22–32)
Calcium: 9.4 mg/dL (ref 8.9–10.3)
Chloride: 104 mmol/L (ref 98–111)
Creatinine, Ser: 1.04 mg/dL — ABNORMAL HIGH (ref 0.44–1.00)
GFR, Estimated: 57 mL/min — ABNORMAL LOW
Glucose, Bld: 191 mg/dL — ABNORMAL HIGH (ref 70–99)
Potassium: 4 mmol/L (ref 3.5–5.1)
Sodium: 142 mmol/L (ref 135–145)
Total Bilirubin: 0.3 mg/dL (ref 0.0–1.2)
Total Protein: 7.8 g/dL (ref 6.5–8.1)

## 2024-04-15 LAB — PROCALCITONIN: Procalcitonin: 0.46 ng/mL

## 2024-04-15 LAB — CBC
HCT: 29.5 % — ABNORMAL LOW (ref 36.0–46.0)
Hemoglobin: 9.1 g/dL — ABNORMAL LOW (ref 12.0–15.0)
MCH: 27.2 pg (ref 26.0–34.0)
MCHC: 30.8 g/dL (ref 30.0–36.0)
MCV: 88.3 fL (ref 80.0–100.0)
Platelets: 285 10*3/uL (ref 150–400)
RBC: 3.34 MIL/uL — ABNORMAL LOW (ref 3.87–5.11)
RDW: 15.9 % — ABNORMAL HIGH (ref 11.5–15.5)
WBC: 11.7 10*3/uL — ABNORMAL HIGH (ref 4.0–10.5)
nRBC: 0 % (ref 0.0–0.2)

## 2024-04-15 LAB — URINALYSIS, ROUTINE W REFLEX MICROSCOPIC
Bilirubin Urine: NEGATIVE
Glucose, UA: NEGATIVE mg/dL
Hgb urine dipstick: NEGATIVE
Ketones, ur: NEGATIVE mg/dL
Nitrite: POSITIVE — AB
Protein, ur: 30 mg/dL — AB
Specific Gravity, Urine: 1.024 (ref 1.005–1.030)
pH: 5 (ref 5.0–8.0)

## 2024-04-15 LAB — RESP PANEL BY RT-PCR (RSV, FLU A&B, COVID)  RVPGX2
Influenza A by PCR: NEGATIVE
Influenza B by PCR: NEGATIVE
Resp Syncytial Virus by PCR: POSITIVE — AB
SARS Coronavirus 2 by RT PCR: NEGATIVE

## 2024-04-15 LAB — BLOOD GAS, VENOUS
Acid-Base Excess: 2.7 mmol/L — ABNORMAL HIGH (ref 0.0–2.0)
Bicarbonate: 27.9 mmol/L (ref 20.0–28.0)
O2 Saturation: 98.8 %
Patient temperature: 37
pCO2, Ven: 44 mmHg (ref 44–60)
pH, Ven: 7.41 (ref 7.25–7.43)
pO2, Ven: 103 mmHg — ABNORMAL HIGH (ref 32–45)

## 2024-04-15 MED ORDER — SODIUM CHLORIDE 0.9 % IV SOLN: 100.0000 mg | Freq: Two times a day (BID) | INTRAVENOUS | Status: DC

## 2024-04-15 MED ORDER — ACETAMINOPHEN 325 MG PO TABS: 650.0000 mg | ORAL_TABLET | Freq: Four times a day (QID) | ORAL | Status: DC | PRN

## 2024-04-15 MED ORDER — SERTRALINE HCL 50 MG PO TABS
150.0000 mg | ORAL_TABLET | Freq: Every day | ORAL | Status: DC
  Administered 2024-04-15 – 2024-04-19 (×5): 150 mg via ORAL
  Filled 2024-04-15 (×5): qty 3

## 2024-04-15 MED ORDER — LIDOCAINE 5 % EX PTCH: 1.0000 | MEDICATED_PATCH | Freq: Every day | CUTANEOUS | Status: DC | PRN

## 2024-04-15 MED ORDER — IPRATROPIUM-ALBUTEROL 0.5-2.5 (3) MG/3ML IN SOLN
3.0000 mL | Freq: Four times a day (QID) | RESPIRATORY_TRACT | Status: DC
  Administered 2024-04-15: 3 mL via RESPIRATORY_TRACT
  Filled 2024-04-15: qty 3

## 2024-04-15 MED ORDER — DICLOFENAC SODIUM 1 % EX GEL: 4.0000 g | Freq: Four times a day (QID) | CUTANEOUS | Status: DC | PRN

## 2024-04-15 MED ORDER — IPRATROPIUM-ALBUTEROL 0.5-2.5 (3) MG/3ML IN SOLN
9.0000 mL | Freq: Once | RESPIRATORY_TRACT | Status: AC
  Administered 2024-04-15: 9 mL via RESPIRATORY_TRACT
  Filled 2024-04-15: qty 9

## 2024-04-15 MED ORDER — ALBUTEROL SULFATE (2.5 MG/3ML) 0.083% IN NEBU: 2.5000 mg | INHALATION_SOLUTION | Freq: Four times a day (QID) | RESPIRATORY_TRACT | Status: DC | PRN

## 2024-04-15 MED ORDER — ACETAMINOPHEN 650 MG RE SUPP: 650.0000 mg | Freq: Four times a day (QID) | RECTAL | Status: DC | PRN

## 2024-04-15 MED ORDER — BUDESON-GLYCOPYRROL-FORMOTEROL 160-9-4.8 MCG/ACT IN AERO
2.0000 | INHALATION_SPRAY | Freq: Two times a day (BID) | RESPIRATORY_TRACT | Status: DC
  Administered 2024-04-15 – 2024-04-20 (×10): 2 via RESPIRATORY_TRACT
  Filled 2024-04-15: qty 5.9

## 2024-04-15 MED ORDER — ASPIRIN 81 MG PO TBEC
81.0000 mg | DELAYED_RELEASE_TABLET | Freq: Every evening | ORAL | Status: DC
  Administered 2024-04-15 – 2024-04-19 (×5): 81 mg via ORAL
  Filled 2024-04-15 (×5): qty 1

## 2024-04-15 MED ORDER — SODIUM CHLORIDE 0.9 % IV SOLN
1.0000 g | Freq: Once | INTRAVENOUS | Status: AC
  Administered 2024-04-15: 1 g via INTRAVENOUS
  Filled 2024-04-15: qty 10

## 2024-04-15 MED ORDER — PREDNISONE 20 MG PO TABS
40.0000 mg | ORAL_TABLET | Freq: Every day | ORAL | Status: DC
  Administered 2024-04-15 – 2024-04-20 (×6): 40 mg via ORAL
  Filled 2024-04-15 (×5): qty 2

## 2024-04-15 MED ORDER — ONDANSETRON HCL 4 MG/2ML IJ SOLN: 4.0000 mg | Freq: Four times a day (QID) | INTRAMUSCULAR | Status: DC | PRN

## 2024-04-15 MED ORDER — ONDANSETRON HCL 4 MG PO TABS: 4.0000 mg | ORAL_TABLET | Freq: Four times a day (QID) | ORAL | Status: DC | PRN

## 2024-04-15 MED ORDER — OXYCODONE HCL 5 MG PO TABS: 10.0000 mg | ORAL_TABLET | Freq: Three times a day (TID) | ORAL | Status: DC | PRN

## 2024-04-15 MED ORDER — GUAIFENESIN-DM 100-10 MG/5ML PO SYRP
5.0000 mL | ORAL_SOLUTION | ORAL | Status: DC | PRN
  Administered 2024-04-19: 5 mL via ORAL
  Filled 2024-04-15: qty 10

## 2024-04-15 MED ORDER — MORPHINE SULFATE (PF) 2 MG/ML IV SOLN: 2.0000 mg | INTRAVENOUS | Status: DC | PRN

## 2024-04-15 MED ORDER — SODIUM CHLORIDE 0.9 % IV BOLUS
1000.0000 mL | Freq: Once | INTRAVENOUS | Status: AC
  Administered 2024-04-15: 1000 mL via INTRAVENOUS

## 2024-04-15 MED ORDER — LEVETIRACETAM 500 MG PO TABS
500.0000 mg | ORAL_TABLET | Freq: Two times a day (BID) | ORAL | Status: DC
  Administered 2024-04-15 – 2024-04-20 (×11): 500 mg via ORAL
  Filled 2024-04-15 (×11): qty 1

## 2024-04-15 MED ORDER — MAGNESIUM HYDROXIDE 400 MG/5ML PO SUSP: 30.0000 mL | Freq: Every day | ORAL | Status: DC | PRN

## 2024-04-15 MED ORDER — MIRTAZAPINE 15 MG PO TABS
15.0000 mg | ORAL_TABLET | Freq: Every day | ORAL | Status: DC
  Administered 2024-04-15 – 2024-04-19 (×5): 15 mg via ORAL
  Filled 2024-04-15 (×5): qty 1

## 2024-04-15 MED ORDER — PANTOPRAZOLE SODIUM 40 MG PO TBEC
40.0000 mg | DELAYED_RELEASE_TABLET | Freq: Every day | ORAL | Status: DC
  Administered 2024-04-15 – 2024-04-20 (×6): 40 mg via ORAL
  Filled 2024-04-15 (×6): qty 1

## 2024-04-15 MED ORDER — OXYCODONE HCL 5 MG PO TABS: 5.0000 mg | ORAL_TABLET | Freq: Three times a day (TID) | ORAL | Status: DC | PRN

## 2024-04-15 MED ORDER — RIVASTIGMINE TARTRATE 1.5 MG PO CAPS
1.5000 mg | ORAL_CAPSULE | Freq: Two times a day (BID) | ORAL | Status: DC
  Administered 2024-04-15 – 2024-04-20 (×11): 1.5 mg via ORAL
  Filled 2024-04-15 (×12): qty 1

## 2024-04-15 MED ORDER — ROSUVASTATIN CALCIUM 10 MG PO TABS
5.0000 mg | ORAL_TABLET | Freq: Every evening | ORAL | Status: DC
  Administered 2024-04-15 – 2024-04-19 (×5): 5 mg via ORAL
  Filled 2024-04-15 (×6): qty 1

## 2024-04-15 MED ORDER — NEPRO/CARBSTEADY PO LIQD
237.0000 mL | Freq: Three times a day (TID) | ORAL | Status: DC
  Administered 2024-04-15 – 2024-04-20 (×16): 237 mL via ORAL

## 2024-04-15 MED ORDER — ALBUTEROL SULFATE (2.5 MG/3ML) 0.083% IN NEBU: 2.5000 mg | INHALATION_SOLUTION | RESPIRATORY_TRACT | Status: DC | PRN

## 2024-04-15 MED ORDER — SODIUM CHLORIDE 0.9 % IV SOLN: INTRAVENOUS | Status: AC

## 2024-04-15 MED ORDER — SODIUM CHLORIDE 0.9 % IV SOLN
2.0000 g | INTRAVENOUS | Status: AC
  Administered 2024-04-16 – 2024-04-19 (×4): 2 g via INTRAVENOUS
  Filled 2024-04-15 (×4): qty 20

## 2024-04-15 MED ORDER — ENOXAPARIN SODIUM 40 MG/0.4ML IJ SOSY
40.0000 mg | PREFILLED_SYRINGE | INTRAMUSCULAR | Status: DC
  Administered 2024-04-15: 40 mg via SUBCUTANEOUS
  Filled 2024-04-15: qty 0.4

## 2024-04-15 MED ORDER — METHYLPREDNISOLONE SODIUM SUCC 125 MG IJ SOLR
125.0000 mg | Freq: Once | INTRAMUSCULAR | Status: AC
  Administered 2024-04-15: 125 mg via INTRAVENOUS
  Filled 2024-04-15: qty 2

## 2024-04-15 MED ORDER — SODIUM CHLORIDE 0.9 % IV SOLN
100.0000 mg | Freq: Two times a day (BID) | INTRAVENOUS | Status: DC
  Administered 2024-04-15 – 2024-04-17 (×5): 100 mg via INTRAVENOUS
  Filled 2024-04-15 (×6): qty 100

## 2024-04-15 NOTE — Progress Notes (Signed)
 " PROGRESS NOTE  Katrina Simon  DOB: 1951-03-17  PCP: System, Provider Not In FMW:[id number]  DOA: 04/15/2024  LOS: 0 days  Hospital Day: 1  Subjective: Patient was seen and examined this morning. Pleasant elderly Caucasian female.  Propped up in bed.  On 2 L oxygen. Alert, awake, oriented to place and person. Husband at bedside who did most of the talking. Remains afebrile, heart rate is in 80s, blood pressure in low 100s,  Brief narrative: Katrina Simon is a 74 y.o. female with PMH significant for dementia, COPD, achalasia/Barrett's with prior myotomy, dysphagia with chronic cough, unintentional weight loss with severe protein calorie malnutrition, anxiety, depression, fibromyalgia.  Patient has shortness of breath, cough, wheezing for about 7 to 10 days. 1/26, patient was started on a course of azithromycin  and prednisone  by her pulmonologist for concern of pneumonia.  However, symptoms did not improve 1/29, felt worse, oxygen saturation was low at 70% at home and is brought to the ED. Of note, in the past 2 months, patient has had 2 episodes of syncope, last one was few days ago.  She was seen by her cardiologist who started her on a Holter monitor on 1/28.  In the ED, patient was afebrile, heart rate in 90s, blood pressure in 90s to low 100s. O2 sat is low in 80s and required up to 4 L by nasal cannula. Initial labs with WBC showing pH 7.41, pCO2 44 Labs with WC count 11.7, hemoglobin 9.1, BUN/creatinine 50/1.04 Respiratory virus panel positive for RSV Chest x-ray showed patchy airspace opacity in the left lower lobe and stable chronic coarsened interstitial markings. EKG showed normal sinus rhythm at 93 bpm, nonspecific ST T wave changes, QTc 445 ms Urinalysis showed hazy yellow urine with trace leukocytes, positive nitrite, many bacteria  Patient was treated with DuoNebs, IV Solu-Medrol , started on IV Rocephin , doxycycline  Admitted to [facility]  Assessment and plan: Acute  respiratory failure with hypoxia RSV infection Left lower lobe pneumonia Acute exacerbation of COPD presented with cough, shortness of breath and wheezing for 7 to 10 days not responding to outpatient antibiotics.   RSV positive.  Chest x-ray with left lower lobe infiltrate No fever.  WBC count mildly elevated.  Obtain procalcitonin level --- Antibiotics: Rocephin , doxycycline  --- Steroids: 1 dose was given in the ED, currently none.  I will start on a 5-day course of prednisone  40 mg. --- Bronchodilators: Continue Breztri  twice daily, albuterol  as needed --- Antitussives: As needed Robitussin DM --- Incentive spirometry, flutter valve:  --- Supplemental O2: Initially required up to 4 LPM O2.  Wean down as tolerated.  Was not on supplemental oxygen at home. Droplet precaution Recent Labs  Lab 04/15/24 0032  WBC 11.7*   UTI Unable to specify urinary symptoms but per husband, patient has been weak and somnolent lately.   Urinalysis showed hazy yellow urine with trace leukocytes, positive nitrite, many bacteria I will treat this as a true UTI.  Add urine culture to previous sample Already on IV Rocephin   H/o achalasia/Barrett's with prior myotomy,  Chronic dysphagia At risk of aspiration pneumonia Patient has chronic oropharyngeal dysphagia and is on dysphagia diet at home.   Seen by SLP.  Able to tolerate pure and nectar consistency liquid with no current evidence of aspiration.  Continue PPI  Recent syncope in the past 2 months, patient has had 2 episodes of syncope, last one was few days ago.  She was seen by her cardiologist who started her on a  Holter monitor on 1/28. Continue telemetry monitor   HypOtension  AKI (acute kidney injury) Creatinine 1.04 up from baseline of 0.69 about 5 months prior Suspect dehydration, hypotension from poor oral intake Electrolytes okay Started on IV fluid Continue to monitor Recent Labs  Lab 04/15/24 0032  NA 142  K 4.0  CL 104  CO2  24  GLUCOSE 191*  BUN 50*  CREATININE 1.04*  CALCIUM  9.4    Severe protein-calorie malnutrition Frailty Unintentional weight loss BMI 14.57 Patient follows with gastroenterology, pulmonology and cardiology Recent GI note reviewed-husband deferring EGD/colonoscopy due to frailty and risk of anesthesia   Mixed Alzheimer's and vascular dementia  Anxiety, depression, fibromyalgia PTA meds- Exelon  1.5 mg twice daily, sertraline  150 mg nightly, Remeron  nightly.  Husband states that she was also recently started on Keppra  by her neurologist after the syncopal episodes, presumably empiric for possible seizure.. Continue all Delirium precautions   Chronic pain syndrome On chronic opiates Resume Oxycodone  at a lower dose of 5 mg 3 times daily PRN, Continue lidocaine  patch, Voltaren  gel   Nutrition Status:         Mobility: Encourage ambulation.  PT eval ordered  PT Orders:   PT Follow up Rec:     Goals of care   Code Status: Full Code     DVT prophylaxis:  enoxaparin  (LOVENOX ) injection 40 mg Start: 04/15/24 0800   Antimicrobials: IV Rocephin , doxycycline  Fluid: None Consultants: None Family Communication: Husband at bedside  Status: Observation Level of care:  Med-Surg   Patient is from: Home Needs to continue in-hospital care: Needs IV antibiotics, on new oxygen requirement. Anticipated d/c to: Pending clinical course.  Pending PT eval      Diet:  Diet Order             DIET - DYS 1 Room service appropriate? No; Fluid consistency: Nectar Thick  Diet effective now                   Scheduled Meds:  aspirin  EC  81 mg Oral QPM   budesonide -glycopyrrolate -formoterol   2 puff Inhalation BID   enoxaparin  (LOVENOX ) injection  40 mg Subcutaneous Q24H   feeding supplement (NEPRO CARB STEADY)  237 mL Oral TID BM   levETIRAcetam   500 mg Oral BID   mirtazapine   15 mg Oral QHS   pantoprazole   40 mg Oral Daily   predniSONE   40 mg Oral Q breakfast    rivastigmine   1.5 mg Oral BID   rosuvastatin   5 mg Oral QPM   sertraline   150 mg Oral QHS    PRN meds: acetaminophen  **OR** acetaminophen , albuterol , diclofenac  Sodium, guaiFENesin -dextromethorphan , lidocaine , magnesium  hydroxide, ondansetron  **OR** ondansetron  (ZOFRAN ) IV, oxyCODONE    Infusions:   sodium chloride  100 mL/hr at 04/15/24 0408   [START ON 04/16/2024] cefTRIAXone  (ROCEPHIN )  IV     doxycycline  (VIBRAMYCIN ) IV Stopped (04/15/24 0445)    Antimicrobials: Anti-infectives (From admission, onward)    Start     Dose/Rate Route Frequency Ordered Stop   04/16/24 0200  cefTRIAXone  (ROCEPHIN ) 2 g in sodium chloride  0.9 % 100 mL IVPB        2 g 200 mL/hr over 30 Minutes Intravenous Every 24 hours 04/15/24 0312 04/20/24 0159   04/15/24 0330  cefTRIAXone  (ROCEPHIN ) 1 g in sodium chloride  0.9 % 100 mL IVPB        1 g 200 mL/hr over 30 Minutes Intravenous  Once 04/15/24 0325 04/15/24 0432   04/15/24 0315  doxycycline  (VIBRAMYCIN )  100 mg in sodium chloride  0.9 % 250 mL IVPB  Status:  Discontinued        100 mg 125 mL/hr over 120 Minutes Intravenous Every 12 hours 04/15/24 0312 04/15/24 0316   04/15/24 0200  doxycycline  (VIBRAMYCIN ) 100 mg in sodium chloride  0.9 % 250 mL IVPB        100 mg 125 mL/hr over 120 Minutes Intravenous Every 12 hours 04/15/24 0149     04/15/24 0200  cefTRIAXone  (ROCEPHIN ) 1 g in sodium chloride  0.9 % 100 mL IVPB        1 g 200 mL/hr over 30 Minutes Intravenous  Once 04/15/24 0149 04/15/24 0238       Objective: Vitals:   04/15/24 0835 04/15/24 1041  BP:  (!) 110/94  Pulse:  (!) 103  Resp:  18  Temp:  98.2 F (36.8 C)  SpO2: 96% 96%    Intake/Output Summary (Last 24 hours) at 04/15/2024 1056 Last data filed at 04/15/2024 0625 Gross per 24 hour  Intake 1450 ml  Output --  Net 1450 ml   Filed Weights   04/15/24 0017  Weight: 42.2 kg   Weight change:  Body mass index is 14.57 kg/m.   Physical Exam: General exam: Pleasant, elderly Caucasian  female.  Not in distress Skin: No rashes, lesions or ulcers. HEENT: Atraumatic, normocephalic, no obvious bleeding Lungs: mild scattered wheezing bilaterally, otherwise clear to auscultation CVS: S1, S2, no murmur,   GI/Abd: Soft, nontender, nondistended, bowel sound present,   CNS: Alert, awake, oriented to place and person. Psychiatry: Mood appropriate Extremities: No pedal edema, no calf tenderness,   Data Review: I have personally reviewed the laboratory data and studies available.  F/u labs ordered Unresulted Labs (From admission, onward)     Start     Ordered   04/16/24 0500  CBC with Differential/Platelet  Tomorrow morning,   R        04/15/24 1056   04/16/24 0500  Basic metabolic panel with GFR  Tomorrow morning,   R        04/15/24 1056   04/15/24 1055  Urine Culture (for pregnant, neutropenic or urologic patients or patients with an indwelling urinary catheter)  (Urine Labs)  Add-on,   AD       Question:  Indication  Answer:  Dysuria   04/15/24 1054   04/15/24 0912  Procalcitonin  Once,   R       Comments: Unable to add on   04/15/24 9088            Signed, Chapman Rota, MD [facility] 04/15/2024  "

## 2024-04-15 NOTE — Assessment & Plan Note (Signed)
 Continue Exelon  and sertraline  Delirium precautions

## 2024-04-15 NOTE — Evaluation (Signed)
 Clinical/Bedside Swallow Evaluation Patient Details  Name: Katrina Simon MRN: [id number] Date of Birth: 01-07-1951  Today's Date: 04/15/2024 Time: SLP Start Time (ACUTE ONLY): 1015 SLP Stop Time (ACUTE ONLY): 1105 SLP Time Calculation (min) (ACUTE ONLY): 50 min  Past Medical History:  Past Medical History:  Diagnosis Date   Achalasia    Allergy    Arthritis    Depression    Emphysema of lung (HCC)    Fibromyalgia    Gastroesophageal reflux disease with esophagitis 01/26/2020   Formatting of this note might be different from the original. LA Grade C noted on EGD 01/2020   GERD (gastroesophageal reflux disease)    Herpes genitalis    History of chicken pox    History of kidney stones    Past Surgical History:  Past Surgical History:  Procedure Laterality Date   CESAREAN SECTION     COLONOSCOPY WITH PROPOFOL  N/A 10/03/2020   Procedure: COLONOSCOPY WITH PROPOFOL ;  Surgeon: Unk Corinn Skiff, MD;  Location[facility] ENDOSCOPY;  Service: Gastroenterology;  Laterality: N/A;   COLONOSCOPY WITH PROPOFOL  N/A 10/04/2020   Procedure: COLONOSCOPY WITH PROPOFOL ;  Surgeon: Unk Corinn Skiff, MD;  Location: [facility]ERY [facility];  Service: Endoscopy;  Laterality: N/A;   ESOPHAGOGASTRODUODENOSCOPY (EGD) WITH PROPOFOL  N/A 11/19/2016   Procedure: ESOPHAGOGASTRODUODENOSCOPY (EGD) WITH PROPOFOL ;  Surgeon: Viktoria Lamar ONEIDA, MD;  Location: [facility] ENDOSCOPY;  Service: Endoscopy;  Laterality: N/A;   ESOPHAGOGASTRODUODENOSCOPY (EGD) WITH PROPOFOL  N/A 07/29/2021   Procedure: ESOPHAGOGASTRODUODENOSCOPY (EGD) WITH PROPOFOL ;  Surgeon: Unk Corinn Skiff, MD;  Location: [facility] ENDOSCOPY;  Service: Gastroenterology;  Laterality: N/A;   GIVENS CAPSULE STUDY N/A 08/08/2021   Procedure: GIVENS CAPSULE STUDY;  Surgeon: Unk Corinn Skiff, MD;  Location: [facility] ENDOSCOPY;  Service: Gastroenterology;  Laterality: N/A;   LAPAROSCOPY N/A    done at [facility] to release esophageal muscles   HPI:  Katrina Simon is a 74 y.o.  female with medical history significant for Mixed Alzheimer's and vascular Dementia, Emphysema, fibromyalgia, COPD, achalasia/Barrett's with prior myotomy, dysphagia with chronic cough, unintentional weight loss with severe protein calorie Malnutrition, being admitted with RSV pneumonia and acute respiratory failure.  History is provided by husband at the bedside who stated that she was having cough, shortness of breath and wheezing for the past week or 2.  They had antibiotics and prednisone  called in by her pulmonologist a few days ago, however she did not appear to be improving and when he checked her O2 sat it was in the 70s.  He brought her to the ED by private vehicle.  Separately, he states patient had a syncopal episode a few days ago.  Katrina Simon has been cared for by Husband for years now; dependent for care of all ADLs.   CXR this admit: Patchy airspace opacity in the left lower lobe, suspicious for infection.  2. Chronic coarsened interstitial markings stable from the prior exam.    Assessment / Plan / Recommendation  Clinical Impression   Katrina Simon was seen for BSE. Katrina Simon was awake; min mumbled phonations but no verbalizations. She slightly nodded head when she was ready to take a bite/sip presented to her. She engaged in the po trials appropriately. Katrina Simon is dependent for feeding per Husband present. OF NOTE: Katrina Simon has Chronic Dysphagia and is on a Pureed diet w/ Nectar liquids at home Baseline per Husband.   On [place] O2 2L; afebrile.   Katrina Simon appears to present w/ oropharyngeal phase dysphagia in setting of declined Cognitive status; Baseline Dementia. Husband reported having to  feed Katrina Simon more now. ANY worsening Cognitive decline can impact her overall awareness/timing of swallow and safety during po tasks which increases risk for aspiration, choking. Katrina Simon's risk for aspiration can be reduced when following general aspiration precautions and using a modified diet consistency of Pureed foods and Nectar liquids -- as is her  Baseline diet. She is dependent for feeding.       Katrina Simon consumed several trials of purees and Nectar liquids via cup/straw w/ No overt clinical s/s of aspiration noted: no decline in phonations, no cough, and no decline in respiratory status during/post trials. O2 sats remained 99%. Oral phase was adequate for bolus management and oral clearing of the boluses given. Min increased/repetitive gumming motions/movements noted b/t trials though no oral residue remained- suspect related to the Cognitive decline. Time and moistening the foods aided in intake/clearing overall.  OM Exam was cursory but No unilateral weakness noted w/ bolus management and straw use. FULL feeding support given.        In setting of baseline Dementia and Cognitive decline, Edentulous status, and her Baseline Dysphagia per chart, recommend continue her Baseline diet consistency of Dysphagia level 1 diet w/ gravies added to moisten/flavor foods; Nectar consistency liquids. Recommend general aspiration precautions; Pills CRUSHED in Puree; tray setup and positioning assistance for meals. Feeding Support and monitoring during meals -- check for oral clearing post meal and alternate foods/liquids. Small bites/sips Slowly during intake.  Education w/ Husband/Katrina Simon in room. Husband present for Education on swallowing; Dementia and its impact on swallowing; Dysphagia; aspiration/aspiration pneumonia risk; aspiration precautions; dysphagia diet consistency recommended. Husband agreed.  ST services will sign off at this time as Katrina Simon appears at/near her Baseline in setting of Cognitive decline/Dementia. This diet consistency will best support Katrina Simon's oral intake and nutrition overall. MD to reconsult if new needs while admitted. NSG updated. Precautions posted at bedside, chart.   ST services recommends follow w/ Palliative Care for GOC and education re: impact of Cognitive decline/Dementia on swallowing. Suspect Katrina Simon is close to/at her Baseline per  Husband/chart information.  SLP Visit Diagnosis: Dysphagia, oropharyngeal phase (R13.12) (Baseline Dysphagia and on dysphagia diet at home; advanced Dementia presentation; feeding dependency; Edentulous; GERD/Dysmotility)    Aspiration Risk  Moderate aspiration risk;Risk for inadequate nutrition/hydration    Diet Recommendation   Nectar;Dysphagia 1 (puree) (gravies) = Dysphagia level 1 diet w/ gravies added to moisten/flavor foods; Nectar consistency liquids. Recommend general aspiration precautions; setup and positioning assistance for meals. Feeding Support and monitoring during meals -- check for oral clearing post meal and alternate foods/liquids. Small bites/sips Slowly during intake.   Medication Administration: Crushed with puree    Other Recommendations Recommended Consults:  (Palliative Care; Dietician) Oral Care Recommendations: Oral care BID;Oral care before and after PO;Staff/trained caregiver to provide oral care Caregiver Recommendations: Avoid jello, ice cream, thin soups, popsicles;Remove water  pitcher;Have oral suction available     Swallow Evaluation Recommendations Caregiver Recommendations: Avoid jello, ice cream, thin soups, popsicles;Remove water  pitcher;Have oral suction available   Assistance Recommended at Discharge  FULL   Functional Status Assessment Patient has had a recent decline in their functional status and/or demonstrates limited ability to make significant improvements in function in a reasonable and predictable amount of time  Frequency and Duration  (n/a)   (n/a)       Prognosis Prognosis for improved oropharyngeal function: Fair Barriers to Reach Goals: Cognitive deficits;Language deficits;Time post onset;Severity of deficits Barriers/Prognosis Comment: Baseline Dysphagia and on dysphagia diet at home; advanced Dementia presentation;  feeding dependency; Edentulous; GERD/Dysmotility      Swallow Study   General Date of Onset: 04/15/24 HPI: Katrina Simon is a  74 y.o. female with medical history significant for Mixed Alzheimer's and vascular Dementia, Emphysema, fibromyalgia, COPD, achalasia/Barrett's with prior myotomy, dysphagia with chronic cough, unintentional weight loss with severe protein calorie Malnutrition, being admitted with RSV pneumonia and acute respiratory failure.  History is provided by husband at the bedside who stated that she was having cough, shortness of breath and wheezing for the past week or 2.  They had antibiotics and prednisone  called in by her pulmonologist a few days ago, however she did not appear to be improving and when he checked her O2 sat it was in the 70s.  He brought her to the ED by private vehicle.  Separately, he states patient had a syncopal episode a few days ago.  Katrina Simon has been cared for by Husband for years now; dependent for care of all ADLs.   CXR this admit: Patchy airspace opacity in the left lower lobe, suspicious for infection.  2. Chronic coarsened interstitial markings stable from the prior exam. Type of Study: Bedside Swallow Evaluation Previous Swallow Assessment: 2023; 2025 - Dysphagia level 1 (puree) w/ Nectar liquids Diet Prior to this Study: NPO Temperature Spikes Noted: No (wbc 11.7) Respiratory Status: Nasal cannula (2L) History of Recent Intubation: No Behavior/Cognition: Alert;Cooperative;Pleasant mood;Confused;Requires cueing;Distractible;Doesn't follow directions (Nonverbal) Oral Cavity Assessment:  (difficult to assess d/t not opening her mouth) Oral Care Completed by SLP:  (could not complete d/t not opening the mouth) Oral Cavity - Dentition: Edentulous (baseline) Vision:  (n/a) Self-Feeding Abilities: Total assist (Husband has been feeding her at home in recent weeks/months) Patient Positioning: Upright in bed (full support) Baseline Vocal Quality:  (nonverbal) Volitional Cough: Cognitively unable to elicit Volitional Swallow: Unable to elicit    Oral/Motor/Sensory Function Overall Oral  Motor/Sensory Function:  (no overt unilateral weakness noted in her mouth movements w/ the boluses)   Ice Chips Ice chips: Not tested   Thin Liquid Thin Liquid: Not tested    Nectar Thick Nectar Thick Liquid: Within functional limits Presentation: Spoon;Straw (fed; ~4-5 ozs total) Other Comments: nectar juice; Nepro   Honey Thick Honey Thick Liquid: Not tested   Puree Puree: Within functional limits Presentation: Spoon (fed; ~4 ozs) Other Comments: min increased oral phase movements/gumming   Solid     Solid: Not tested         Comer Portugal, MS, CCC-SLP Speech Language Pathologist Rehab Services; [facility] [place] [phone] (ascom) Ralene Gasparyan 04/15/2024,4:01 PM

## 2024-04-15 NOTE — Assessment & Plan Note (Signed)
 Suspect secondary to poor oral intake IV hydration

## 2024-04-15 NOTE — ED Notes (Addendum)
 RN attempted to straight cath pt for urine; attempt was unsuccessful. Will try with a purewick to get urine sample.

## 2024-04-15 NOTE — Plan of Care (Signed)

## 2024-04-15 NOTE — Assessment & Plan Note (Signed)
 Frailty Unintentional weight loss BMI 14.57 Patient follows with gastroenterology, pulmonology and cardiology Recent GI note reviewed-husband deferring EGD/colonoscopy due to frailty and risk of anesthesia - Dietitian consult

## 2024-04-15 NOTE — Assessment & Plan Note (Signed)
 Short segment Barrett's esophagus Chronic cough No acute issues Increased risk of aspiration

## 2024-04-15 NOTE — Assessment & Plan Note (Signed)
 On chronic opiates Patient is on oxycodone  10 mg 3 times daily Will hold until more awake, very somnolent at this time

## 2024-04-15 NOTE — Assessment & Plan Note (Addendum)
 Prior history of laxative overuse Creatinine 1.04 up from baseline of 0.69 about 5 months prior Suspect dehydration from poor oral intake IV hydration Monitor renal function and avoid nephrotoxic

## 2024-04-15 NOTE — Hospital Course (Addendum)
 Katrina Simon

## 2024-04-15 NOTE — Progress Notes (Signed)
 Patients husband at bedside. Answered all admission questions

## 2024-04-15 NOTE — Assessment & Plan Note (Signed)
 Nebulizers as needed and scheduled Will hold off on steroids in the setting of acute infection-not currently wheezing on exam

## 2024-04-15 NOTE — H&P (Addendum)
 " History and Physical    Patient: Katrina Simon FMW:[id number] DOB: May 20, 1950 DOA: 04/15/2024 DOS: the patient was seen and examined on 04/15/2024 PCP: System, Provider Not In  Patient coming from: Home  Chief Complaint:  Chief Complaint  Patient presents with   Cough   Weakness    HPI: Katrina Simon is a 74 y.o. female with medical history significant for Dementia, COPD, achalasia/Barrett's with prior myotomy, dysphagia with chronic cough, unintentional weight loss with severe protein calorie malnutrition, being admitted with RSV pneumonia and acute respiratory failure.  History is provided by husband at the bedside who stated that she was having cough, shortness of breath and wheezing for the past week or 2.  They had antibiotics and prednisone  called in by her pulmonologist a few days ago, however she did not appear to be improving and when he checked her O2 sat it was in the 70s.  He brought her to the ED by private vehicle. Separately, he states patient had a syncopal episode a few days ago, and had a Holter monitor placed at her cardiologist office the day prior. Upon arrival, O2 sat was around 87%, improving to 91% on 4 L and was weaned back a bit and is currently satting at 99% on 2 L.  BP was soft at 97/57 but vitals otherwise unremarkable.  BMI noted to be 14.57. Labs notable for the following: - VBG unremarkable - WBC 11.7 and RSV positive- Hemoglobin at baseline at 9.1 - CMP notable for creatinine 1.04 up from baseline of 0.69 about 5 months prior  EKG, personally interpreted showing NSR at 93 Chest x-ray showed patchy airspace opacity left lower lobe suspicious for infection  Patient treated with DuoNebs, started on Rocephin  and doxycycline  and was also given a dose of Solu-Medrol  125 mg  Admission requested.     Past Medical History:  Diagnosis Date   Achalasia    Allergy    Arthritis    Depression    Emphysema of lung (HCC)    Fibromyalgia    Gastroesophageal  reflux disease with esophagitis 01/26/2020   Formatting of this note might be different from the original. LA Grade C noted on EGD 01/2020   GERD (gastroesophageal reflux disease)    Herpes genitalis    History of chicken pox    History of kidney stones    Past Surgical History:  Procedure Laterality Date   CESAREAN SECTION     COLONOSCOPY WITH PROPOFOL  N/A 10/03/2020   Procedure: COLONOSCOPY WITH PROPOFOL ;  Surgeon: Unk Corinn Skiff, MD;  Location: [facility] ENDOSCOPY;  Service: Gastroenterology;  Laterality: N/A;   COLONOSCOPY WITH PROPOFOL  N/A 10/04/2020   Procedure: COLONOSCOPY WITH PROPOFOL ;  Surgeon: Unk Corinn Skiff, MD;  Location: [facility];  Service: Endoscopy;  Laterality: N/A;   ESOPHAGOGASTRODUODENOSCOPY (EGD) WITH PROPOFOL  N/A 11/19/2016   Procedure: ESOPHAGOGASTRODUODENOSCOPY (EGD) WITH PROPOFOL ;  Surgeon: Viktoria Lamar ONEIDA, MD;  Location: [facility]COPY;  Service: Endoscopy;  Laterality: N/A;   ESOPHAGOGASTRODUODENOSCOPY (EGD) WITH PROPOFOL  N/A 07/29/2021   Procedure: ESOPHAGOGASTRODUODENOSCOPY (EGD) WITH PROPOFOL ;  Surgeon: Unk Corinn Skiff, MD;  Location: [facility] ENDOSCOPY;  Service: Gastroenterology;  Laterality: N/A;   GIVENS CAPSULE STUDY N/A 08/08/2021   Procedure: GIVENS CAPSULE STUDY;  Surgeon: Unk Corinn Skiff, MD;  Location: [facility] ENDOSCOPY;  Service: Gastroenterology;  Laterality: N/A;   LAPAROSCOPY N/A    done at [facility] to release esophageal muscles   Social History:  reports that she quit smoking about 5 years ago. Her smoking  use included cigarettes. She started smoking about 50 years ago. She has a 22.5 pack-year smoking history. She has never used smokeless tobacco. She reports that she does not drink alcohol and does not use drugs.  Allergies[1]  Family History  Problem Relation Age of Onset   Arthritis Mother    Heart disease Father    Stroke Father    Breast cancer Neg Hx     Prior to Admission medications  Medication Sig Start  Date End Date Taking? Authorizing Provider  azithromycin  (ZITHROMAX ) 250 MG tablet Take 250 mg by mouth daily. Take 2 tablets (500mg ) by mouth on Day 1. Take 1 tablet (250mg ) by mouth on Days 2-5. 04/12/24 04/17/24 Yes [provider]  acetaminophen  (TYLENOL ) 325 MG tablet Take 2 tablets (650 mg total) by mouth every 6 (six) hours as needed for mild pain (pain score 1-3), moderate pain (pain score 4-6), fever or headache (or Fever >/= 101). 05/21/23   Von Bellis, MD  aspirin  EC 81 MG tablet Take 81 mg by mouth daily. Swallow whole.    [provider]  Cholecalciferol  50 MCG (2000 UT) TABS Take 2,000 Units by mouth at bedtime.    [provider]  diclofenac  Sodium (VOLTAREN ) 1 % GEL Apply 4 g topically 4 (four) times daily. Patient taking differently: Apply 4 g topically 4 (four) times daily as needed. 12/14/19   Hyatt, Max T, DPM  folic acid  (FOLVITE ) 400 MCG tablet Take 400 mcg by mouth at bedtime.    [provider]  ipratropium-albuterol  (DUONEB) 0.5-2.5 (3) MG/3ML SOLN Inhale 3 mLs into the lungs every 6 (six) hours as needed. 03/19/23 04/17/24  [provider]  lidocaine  (LIDODERM ) 5 % Place 1 patch onto the skin daily as needed. 05/26/21   [provider]  magnesium  hydroxide (MILK OF MAGNESIA) 400 MG/5ML suspension Take 30 mLs by mouth daily as needed for mild constipation. 11/12/23   Dorinda Drue DASEN, MD  naloxone  (NARCAN ) nasal spray 4 mg/0.1 mL As directed for opioid induced respiratory depression 07/29/23   Myra Lynwood MATSU, MD  Oxycodone  HCl 10 MG TABS Take 1 tablet (10 mg total) by mouth in the morning, at noon, and at bedtime. 03/31/24 04/30/24  Myra Lynwood MATSU, MD  Oxycodone  HCl 10 MG TABS Take 1 tablet (10 mg total) by mouth in the morning, at noon, and at bedtime. 04/30/24 05/30/24  Myra Lynwood MATSU, MD  pantoprazole  (PROTONIX ) 40 MG tablet Take by mouth. 07/13/22 02/22/26  [provider]  rivastigmine  (EXELON ) 1.5 MG capsule Take 1.5 mg by  mouth 2 (two) times daily. 05/14/23 05/13/24  [provider]  sertraline  (ZOLOFT ) 100 MG tablet Take 100 mg by mouth at bedtime. Patient taking differently: Take 150 mg by mouth at bedtime. 11/13/22   [provider]  TRELEGY ELLIPTA  200-62.5-25 MCG/ACT AEPB Inhale 1 puff into the lungs daily. 04/23/23   [provider]    Physical Exam: Vitals:   04/15/24 0018 04/15/24 0140 04/15/24 0205 04/15/24 0230  BP: 109/64  (!) 97/57 (!) 96/56  Pulse: 95 80 80 93  Resp: 20 15 14 14   TempSrc: Oral     SpO2: 91% 98% 99% 95%  Weight:      Height:       Physical Exam Vitals and nursing note reviewed.  Constitutional:      General: She is not in acute distress.    Appearance: She is cachectic.  HENT:     Head: Normocephalic and atraumatic.  Cardiovascular:     Rate and Rhythm: Normal rate and regular rhythm.     Heart sounds: Normal heart sounds.  Pulmonary:     Effort: Tachypnea present.     Breath sounds: Wheezing present.  Abdominal:     Palpations: Abdomen is soft.     Tenderness: There is no abdominal tenderness.  Neurological:     Mental Status: She is lethargic.     Labs on Admission: I have personally reviewed following labs and imaging studies  CBC: Recent Labs  Lab 04/15/24 0032  WBC 11.7*  HGB 9.1*  HCT 29.5*  MCV 88.3  PLT 285   Basic Metabolic Panel: Recent Labs  Lab 04/15/24 0032  NA 142  K 4.0  CL 104  CO2 24  GLUCOSE 191*  BUN 50*  CREATININE 1.04*  CALCIUM  9.4   GFR: Estimated Creatinine Clearance: 32.6 mL/min (A) (by C-G formula based on SCr of 1.04 mg/dL (H)). Liver Function Tests: Recent Labs  Lab 04/15/24 0032  AST 15  ALT 7  ALKPHOS 112  BILITOT 0.3  PROT 7.8  ALBUMIN 3.6   No results for input(s): LIPASE, AMYLASE in the last 168 hours. No results for input(s): AMMONIA in the last 168 hours. Coagulation Profile: No results for input(s): INR, PROTIME in the last 168 hours. Cardiac Enzymes: No  results for input(s): CKTOTAL, CKMB, CKMBINDEX, TROPONINI in the last 168 hours. BNP (last 3 results) No results for input(s): PROBNP in the last 8760 hours. HbA1C: No results for input(s): HGBA1C in the last 72 hours. CBG: No results for input(s): GLUCAP in the last 168 hours. Lipid Profile: No results for input(s): CHOL, HDL, LDLCALC, TRIG, CHOLHDL, LDLDIRECT in the last 72 hours. Thyroid Function Tests: No results for input(s): TSH, T4TOTAL, FREET4, T3FREE, THYROIDAB in the last 72 hours. Anemia Panel: No results for input(s): VITAMINB12, FOLATE, FERRITIN, TIBC, IRON, RETICCTPCT in the last 72 hours. Urine analysis:    Component Value Date/Time   COLORURINE AMBER (A) 11/10/2023 2301   APPEARANCEUR HAZY (A) 11/10/2023 2301   LABSPEC 1.027 11/10/2023 2301   PHURINE 5.0 11/10/2023 2301   GLUCOSEU NEGATIVE 11/10/2023 2301   HGBUR MODERATE (A) 11/10/2023 2301   BILIRUBINUR NEGATIVE 11/10/2023 2301   KETONESUR NEGATIVE 11/10/2023 2301   PROTEINUR 30 (A) 11/10/2023 2301   NITRITE NEGATIVE 11/10/2023 2301   LEUKOCYTESUR NEGATIVE 11/10/2023 2301    Radiological Exams on Admission: DG Chest 1 View Result Date: 04/15/2024 EXAM: 1 VIEW(S) XRAY OF THE CHEST 04/15/2024 01:12:00 AM COMPARISON: 11/10/2023 CLINICAL HISTORY: Cough; infection; chronic obstructive pulmonary disease; shortness of breath. FINDINGS: LUNGS AND PLEURA: Patchy airspace opacity in left lower lobe. Chronic coarsened interstitial markings. No pleural effusion. No pneumothorax. HEART AND MEDIASTINUM: Atherosclerotic plaque. No acute abnormality of the cardiac and mediastinal silhouettes. BONES AND SOFT TISSUES: No acute osseous abnormality. IMPRESSION: 1. Patchy airspace opacity in the left lower lobe, suspicious for infection. 2. Chronic coarsened interstitial markings stable from the prior exam. Electronically signed by: Oneil Devonshire MD 04/15/2024 01:19 AM EST RP Workstation:  [id number]   Data Reviewed for HPI: Relevant notes from primary care and specialist visits, past discharge summaries as available in EHR, including Care Everywhere. Prior diagnostic testing as pertinent to current admission diagnoses Updated medications and problem lists for reconciliation ED course, including vitals, labs, imaging, treatment and response to treatment Triage notes, nursing and pharmacy notes and ED provider's notes Notable results as noted above in HPI      Assessment and Plan: *  RSV (respiratory syncytial virus pneumonia) CAP / at risk for aspiration Acute respiratory failure with hypoxia Patient presented with cough, shortness of breath and wheezing not responding to outpatient treatment requiring 2 L to maintain sats in the high 90s(O2 sat 70s at home) Continue Rocephin  and doxycycline  Will keep n.p.o. speech therapy evaluation to eval for aspiration Supplemental oxygen Droplet precaution  COPD with acute exacerbation (HCC) Nebulizers as needed and scheduled Will hold off on steroids in the setting of acute infection-not currently wheezing on exam  History of syncope Syncopal episode in the past week Currently with a Holter monitor since 04/13/2024 Will place on telemetry  AKI (acute kidney injury) Prior history of laxative overuse Creatinine 1.04 up from baseline of 0.69 about 5 months prior Suspect dehydration from poor oral intake IV hydration Monitor renal function and avoid nephrotoxic  Hypotension Suspect secondary to poor oral intake IV hydration  Protein-calorie malnutrition, severe Frailty Unintentional weight loss BMI 14.57 Patient follows with gastroenterology, pulmonology and cardiology Recent GI note reviewed-husband deferring EGD/colonoscopy due to frailty and risk of anesthesia  Achalasia Short segment Barrett's esophagus Chronic cough No acute issues Increased risk of aspiration  Mixed Alzheimer's and vascular dementia  (HCC) Continue Exelon  and sertraline  Delirium precautions  Chronic pain syndrome On chronic opiates Patient is on oxycodone  10 mg 3 times daily Will hold until more awake, very somnolent at this time     DVT prophylaxis: Lovenox   Consults: none  Advance Care Planning: full code  Family Communication: husband at bedside  Disposition Plan: Back to previous home environment  Severity of Illness: The appropriate patient status for this patient is OBSERVATION. Observation status is judged to be reasonable and necessary in order to provide the required intensity of service to ensure the patient's safety. The patient's presenting symptoms, physical exam findings, and initial radiographic and laboratory data in the context of their medical condition is felt to place them at decreased risk for further clinical deterioration. Furthermore, it is anticipated that the patient will be medically stable for discharge from the hospital within 2 midnights of admission.   Author: Delayne LULLA Solian, MD 04/15/2024 3:01 AM  For on call review [email or web address].      [1]  Allergies Allergen Reactions   Penicillins Hives and Rash    Other Reaction(s): Other (See Comments)   Statins Other (See Comments)    Myalgia   "

## 2024-04-15 NOTE — Evaluation (Signed)
 Physical Therapy Evaluation Patient Details Name: Katrina Simon MRN: [id number] DOB: [birth year] Today's Date: 04/15/2024  History of Present Illness  Pt is a 74 y.o. female with medical history significant for dementia, COPD, achalasia/Barrett's with prior myotomy, dysphagia with chronic cough, unintentional weight loss with severe protein calorie malnutrition, being admitted with RSV pneumonia and acute respiratory failure.  MD assessment includes: acute respiratory failure with hypoxia, RSV infection, LLL PNA, COPD exacerbation, UTI, AKI, hypotension, and recent syncope.   Clinical Impression  Pt non-verbal and A&O x 0 during the session with difficulty following 1-step commands. Pt was able to follow simple commands occasionally, however, but required extensive multi-modal cuing to do so.  Pt required heavy assistance with functional tasks this session limited by both functional weakness and cognitive deficits/inability to sequence tasks or follow commands.  With assistance pt was able to come to standing and take several steps forwards with assist to prevent posterior LOB but was unable to follow commands to take backward steps back to the bed and the bed was rolled under the patient to assist with returning safely back to sitting.  Pt will benefit from a trial of continued PT services upon discharge to safely address deficits listed in patient problem list for decreased caregiver assistance and eventual return to PLOF.        If plan is discharge home, recommend the following: A lot of help with walking and/or transfers;A lot of help with bathing/dressing/bathroom;Assistance with cooking/housework;Assistance with feeding;Direct supervision/assist for medications management;Direct supervision/assist for financial management;Assist for transportation;Help with stairs or ramp for entrance;Supervision due to cognitive status   Can travel by private vehicle        Equipment Recommendations None  recommended by PT  Recommendations for Other Services       Functional Status Assessment Patient has had a recent decline in their functional status and/or demonstrates limited ability to make significant improvements in function in a reasonable and predictable amount of time     Precautions / Restrictions Precautions Precautions: Fall Restrictions Weight Bearing Restrictions Per Provider Order: No      Mobility  Bed Mobility Overal bed mobility: Needs Assistance Bed Mobility: Supine to Sit, Sit to Supine     Supine to sit: +2 for physical assistance, Total assist Sit to supine: Total assist, +2 for physical assistance   General bed mobility comments: Total assist for BLE and trunk control    Transfers Overall transfer level: Needs assistance Equipment used: 2 person hand held assist Transfers: Sit to/from Stand Sit to Stand: Mod assist, +2 physical assistance           General transfer comment: Pt with posterior lean in sitting and standing requiring constand min to mod A to correct    Ambulation/Gait Ambulation/Gait assistance: Mod assist Gait Distance (Feet): 2 Feet Assistive device: 2 person hand held assist Gait Pattern/deviations: Step-to pattern, Decreased step length - right, Decreased step length - left, Leaning posteriorly Gait velocity: decreased     General Gait Details: Pt able to take several small steps near the EOB with constant assist to prevent posterior LOB; unable to follow commands to step backwards back to bed with bed rolled under pt  Stairs            Wheelchair Mobility     Tilt Bed    Modified Rankin (Stroke Patients Only)       Balance Overall balance assessment: Needs assistance Sitting-balance support: Single extremity supported, Feet supported Sitting balance-Leahy Scale: Poor  Sitting balance - Comments: Posterior instability per above Postural control: Posterior lean Standing balance support: Bilateral upper  extremity supported, During functional activity Standing balance-Leahy Scale: Poor Standing balance comment: Posterior instability per above                             Pertinent Vitals/Pain Pain Assessment Pain Assessment: PAINAD Breathing: normal Negative Vocalization: none Facial Expression: smiling or inexpressive Body Language: relaxed Consolability: no need to console PAINAD Score: 0 Pain Intervention(s): Monitored during session    Home Living Family/patient expects to be discharged to:: Private residence Living Arrangements: Spouse/significant other Available Help at Discharge: Family;Available 24 hours/day Type of Home: Mobile home Home Access: Stairs to enter Entrance Stairs-Rails: None Entrance Stairs-Number of Steps: 2   Home Layout: One level Home Equipment: Rollator (4 wheels);Cane - single point Additional Comments: Pt will not use RW, spouse assists during transfers and ambulation with HHA    Prior Function Prior Level of Function : Needs assist             Mobility Comments: Spouse provides hands on assist for all mobility with pt able to amb limited HH distances at baseline; pt refuses to use AD ADLs Comments: Spouse assists with all ADLs     Extremity/Trunk Assessment   Upper Extremity Assessment Upper Extremity Assessment: Generalized weakness    Lower Extremity Assessment Lower Extremity Assessment: Generalized weakness       Communication   Communication Communication: Impaired Factors Affecting Communication: Difficulty expressing self    Cognition Arousal: Alert Behavior During Therapy: WFL for tasks assessed/performed   PT - Cognitive impairments: History of cognitive impairments                       PT - Cognition Comments: Non verbal, A&O x 0 Following commands: Impaired Following commands impaired: Follows one step commands inconsistently     Cueing Cueing Techniques: Verbal cues, Tactile cues      General Comments      Exercises     Assessment/Plan    PT Assessment Patient needs continued PT services  PT Problem List Decreased strength;Decreased activity tolerance;Decreased balance;Decreased mobility;Decreased cognition;Decreased safety awareness       PT Treatment Interventions DME instruction;Gait training;Stair training;Functional mobility training;Therapeutic activities;Therapeutic exercise;Balance training;Patient/family education    PT Goals (Current goals can be found in the Care Plan section)  Acute Rehab PT Goals Patient Stated Goal: To return home once medically appropriate with HHPT PT Goal Formulation: With family Time For Goal Achievement: 04/28/24 Potential to Achieve Goals: Fair    Frequency Min 1X/week     Co-evaluation               AM-PAC PT 6 Clicks Mobility  Outcome Measure Help needed turning from your back to your side while in a flat bed without using bedrails?: A Lot Help needed moving from lying on your back to sitting on the side of a flat bed without using bedrails?: A Lot Help needed moving to and from a bed to a chair (including a wheelchair)?: A Lot Help needed standing up from a chair using your arms (e.g., wheelchair or bedside chair)?: A Lot Help needed to walk in hospital room?: A Lot Help needed climbing 3-5 steps with a railing? : A Lot 6 Click Score: 12    End of Session Equipment Utilized During Treatment: Gait belt Activity Tolerance: Patient tolerated treatment well Patient left:  in bed;with call bell/phone within reach;with family/visitor present;Other (comment) (nursing in room to transport pt to the floor from the ED at end of session) Nurse Communication: Mobility status PT Visit Diagnosis: Unsteadiness on feet (R26.81);Difficulty in walking, not elsewhere classified (R26.2);Muscle weakness (generalized) (M62.81)    Time: 8384-8366 PT Time Calculation (min) (ACUTE ONLY): 18 min   Charges:   PT  Evaluation $PT Eval Moderate Complexity: 1 Mod   PT General Charges $$ ACUTE PT VISIT: 1 Visit       D. Glendia Bertin PT, DPT 04/15/24, 5:21 PM

## 2024-04-15 NOTE — ED Triage Notes (Signed)
 Pt family reports pt was started on abx and prednisone  on Monday by pulmonologist for concern of pneumonia. Reports cough appears worse today with Sppo2 reportedly in 70's at home. Pt currently 80 % RA  and 2 L [place] applied. No oxygen use at home. Pt denies chest pain or pressure. Pt appears at baseline per husband.  Spo2 improved to 87% 2L [place]. [place] increased to 4L [place] and Spo2 normalized.

## 2024-04-15 NOTE — Assessment & Plan Note (Addendum)
 Syncopal episode in the past week Currently with a Holter monitor since 04/13/2024 Will place on telemetry

## 2024-04-15 NOTE — ED Provider Notes (Signed)
 "  [facility] Provider Note    Event Date/Time   First MD Initiated Contact with Patient 04/15/24 0045     (approximate)   History   Cough and Weakness   HPI  Katrina Simon is a 74 y.o. female   Past medical history of former smoker, COPD not on home O2, GERD, chronic pain, Alzheimer's dementia, here with shortness of breath and productive cough for the last several days.  Husband checked her O2 sat was in the 70s.  Called EMS who noted hypoxemia as well and put her on 2 L with normalized O2 levels.   She denies chest pain.   Unknown sick contacts.   No GI or GU symptoms.   Husband notes that patient was seen by outpatient pulmonology and started on antibiotics 2 days ago but despite this therapy has been worsening.    Direct interview with the patient is limited by her dementia.  Independent Historian contributed to assessment above: Husband corroborates information past medical history as above  External Medical Documents Reviewed: Previous outpatient pulmonology notes.      Physical Exam   Triage Vital Signs: ED Triage Vitals  Encounter Vitals Group     BP 04/15/24 0018 109/64     Girls Systolic BP Percentile --      Girls Diastolic BP Percentile --      Boys Systolic BP Percentile --      Boys Diastolic BP Percentile --      Pulse Rate 04/15/24 0018 95     Resp 04/15/24 0018 20     Temp --      Temp Source 04/15/24 0018 Oral     SpO2 04/15/24 0018 91 %     Weight 04/15/24 0017 93 lb (42.2 kg)     Height 04/15/24 0017 5' 7 (1.702 m)     Head Circumference --      Peak Flow --      Pain Score 04/15/24 0017 0     Pain Loc --      Pain Education --      Exclude from Growth Chart --     Most recent vital signs: Vitals:   04/15/24 0430 04/15/24 0530  BP: 104/60 (!) 104/57  Pulse: 96 92  Resp: 20 13  Temp:    SpO2: 93% 93%    General: Awake, no distress.  CV:  Good peripheral perfusion.  Resp:  Normal effort.  Abd:  No  distention.  Other:  Disoriented, but this appears to be at baseline per husband who is at bedside report.  No significant respiratory distress, and her oxygenation is normal on the 2 L nasal cannula.  She has scant wheezing throughout and coarse lung sounds throughout.  No rales.  No significant peripheral edema.  Benign abdominal exam.   ED Results / Procedures / Treatments   Labs (all labs ordered are listed, but only abnormal results are displayed) Labs Reviewed  RESP PANEL BY RT-PCR (RSV, FLU A&B, COVID)  RVPGX2 - Abnormal; Notable for the following components:      Result Value   Resp Syncytial Virus by PCR POSITIVE (*)    All other components within normal limits  COMPREHENSIVE METABOLIC PANEL WITH GFR - Abnormal; Notable for the following components:   Glucose, Bld 191 (*)    BUN 50 (*)    Creatinine, Ser 1.04 (*)    GFR, Estimated 57 (*)    All other components within normal limits  CBC - Abnormal; Notable for the following components:   WBC 11.7 (*)    RBC 3.34 (*)    Hemoglobin 9.1 (*)    HCT 29.5 (*)    RDW 15.9 (*)    All other components within normal limits  BLOOD GAS, VENOUS - Abnormal; Notable for the following components:   pO2, Ven 103 (*)    Acid-Base Excess 2.7 (*)    All other components within normal limits  URINALYSIS, ROUTINE W REFLEX MICROSCOPIC  CBG MONITORING, ED     I ordered and reviewed the above labs they are notable for RSV positive.    EKG  ED ECG REPORT I, Ginnie Shams, the attending physician, personally viewed and interpreted this ECG.   Date: 04/15/2024  EKG Time: 0025  Rate: 93  Rhythm: sinus  Axis: nl  Intervals:nl  ST&T Change: no stemi    RADIOLOGY I independently reviewed and interpreted chest x-ray and I see diffuse focal opacities concerning for multifocal pneumonia I also reviewed radiologist's formal read.    PROCEDURES:  Critical Care performed: Yes, see critical care procedure note(s)  .Critical  Care  Performed by: Shams Ginnie, MD Authorized by: Shams Ginnie, MD   Critical care provider statement:    Critical care time (minutes):  35   Critical care was time spent personally by me on the following activities:  Development of treatment plan with patient or surrogate, discussions with consultants, evaluation of patient's response to treatment, examination of patient, ordering and review of laboratory studies, ordering and review of radiographic studies, ordering and performing treatments and interventions, pulse oximetry, re-evaluation of patient's condition and review of old charts    MEDICATIONS ORDERED IN ED: Medications  doxycycline  (VIBRAMYCIN ) 100 mg in sodium chloride  0.9 % 250 mL IVPB (100 mg Intravenous New Bag/Given 04/15/24 0245)  oxyCODONE  (Oxy IR/ROXICODONE ) immediate release tablet 10 mg (has no administration in time range)  rivastigmine  (EXELON ) capsule 1.5 mg (1.5 mg Oral Not Given 04/15/24 0415)  sertraline  (ZOLOFT ) tablet 150 mg (has no administration in time range)  pantoprazole  (PROTONIX ) EC tablet 40 mg (has no administration in time range)  enoxaparin  (LOVENOX ) injection 40 mg (has no administration in time range)  acetaminophen  (TYLENOL ) tablet 650 mg (has no administration in time range)    Or  acetaminophen  (TYLENOL ) suppository 650 mg (has no administration in time range)  ondansetron  (ZOFRAN ) tablet 4 mg (has no administration in time range)    Or  ondansetron  (ZOFRAN ) injection 4 mg (has no administration in time range)  cefTRIAXone  (ROCEPHIN ) 2 g in sodium chloride  0.9 % 100 mL IVPB (has no administration in time range)  ipratropium-albuterol  (DUONEB) 0.5-2.5 (3) MG/3ML nebulizer solution 3 mL (has no administration in time range)  albuterol  (PROVENTIL ) (2.5 MG/3ML) 0.083% nebulizer solution 2.5 mg (has no administration in time range)  0.9 %  sodium chloride  infusion ( Intravenous New Bag/Given 04/15/24 0408)  morphine  (PF) 2 MG/ML injection 2 mg (has no  administration in time range)  cefTRIAXone  (ROCEPHIN ) 1 g in sodium chloride  0.9 % 100 mL IVPB (0 g Intravenous Stopped 04/15/24 0238)  ipratropium-albuterol  (DUONEB) 0.5-2.5 (3) MG/3ML nebulizer solution 9 mL (9 mLs Nebulization Given 04/15/24 0202)  methylPREDNISolone  sodium succinate (SOLU-MEDROL ) 125 mg/2 mL injection 125 mg (125 mg Intravenous Given 04/15/24 0206)  sodium chloride  0.9 % bolus 1,000 mL (1,000 mLs Intravenous New Bag/Given 04/15/24 0402)  cefTRIAXone  (ROCEPHIN ) 1 g in sodium chloride  0.9 % 100 mL IVPB (0 g Intravenous Stopped 04/15/24 0432)  External physician / consultants:  I spoke with hospital medicine consulted for admission and regarding care plan for this patient.   IMPRESSION / MDM / ASSESSMENT AND PLAN / ED COURSE  I reviewed the triage vital signs and the nursing notes.                                Patient's presentation is most consistent with acute presentation with potential threat to life or bodily function.  Differential diagnosis includes, but is not limited to, respiratory infection, bacterial pneumonia, COPD exacerbation, acute hypoxemic respiratory failure, considered but less likely ACS PE dissection sepsis   The patient is on the cardiac monitor to evaluate for evidence of arrhythmia and/or significant heart rate changes.  MDM:    Patient with acute hypoxemic respiratory failure in the setting of COPD exacerbation and respiratory infectious symptoms.  Found to be RSV positive and chest x-ray reads without focal opacity concerning for community-acquired pneumonia will cover with CAP coverage as well as can maintain on O2 and give DuoNebs Solu-Medrol .  Admission to hospital service.      FINAL CLINICAL IMPRESSION(S) / ED DIAGNOSES   Final diagnoses:  Respiratory infection  RSV (acute bronchiolitis due to respiratory syncytial virus)  Community acquired pneumonia, unspecified laterality  Acute cough  Acute exacerbation of chronic obstructive  pulmonary disease (COPD) (HCC)  Acute hypoxemic respiratory failure (HCC)     Rx / DC Orders   ED Discharge Orders     None        Note:  This document was prepared using Dragon voice recognition software and may include unintentional dictation errors.    Cyrena Mylar, MD 04/15/24 [phone]  "

## 2024-04-15 NOTE — Assessment & Plan Note (Addendum)
 CAP / at risk for aspiration Acute respiratory failure with hypoxia Patient presented with cough, shortness of breath and wheezing not responding to outpatient treatment requiring 2 L to maintain sats in the high 90s(O2 sat 70s at home) Was evaluated by swallow team And found to have oropharyngeal phase dysphagia secondary to cognitive decline-started on dysphagia 1 diet with nectar thick liquid Continue Rocephin  and doxycycline  Supplemental oxygen-wean as tolerated Will check MRSA PCR Droplet precaution

## 2024-04-16 DIAGNOSIS — R051 Acute cough: Secondary | ICD-10-CM

## 2024-04-16 DIAGNOSIS — N390 Urinary tract infection, site not specified: Secondary | ICD-10-CM

## 2024-04-16 DIAGNOSIS — J988 Other specified respiratory disorders: Principal | ICD-10-CM

## 2024-04-16 DIAGNOSIS — J210 Acute bronchiolitis due to respiratory syncytial virus: Secondary | ICD-10-CM

## 2024-04-16 LAB — CBC WITH DIFFERENTIAL/PLATELET
Abs Immature Granulocytes: 0.3 10*3/uL — ABNORMAL HIGH (ref 0.00–0.07)
Basophils Absolute: 0 10*3/uL (ref 0.0–0.1)
Basophils Relative: 0 %
Eosinophils Absolute: 0 10*3/uL (ref 0.0–0.5)
Eosinophils Relative: 0 %
HCT: 26 % — ABNORMAL LOW (ref 36.0–46.0)
Hemoglobin: 8 g/dL — ABNORMAL LOW (ref 12.0–15.0)
Immature Granulocytes: 3 %
Lymphocytes Relative: 9 %
Lymphs Abs: 0.8 10*3/uL (ref 0.7–4.0)
MCH: 27.5 pg (ref 26.0–34.0)
MCHC: 30.8 g/dL (ref 30.0–36.0)
MCV: 89.3 fL (ref 80.0–100.0)
Monocytes Absolute: 0.6 10*3/uL (ref 0.1–1.0)
Monocytes Relative: 7 %
Neutro Abs: 7.9 10*3/uL — ABNORMAL HIGH (ref 1.7–7.7)
Neutrophils Relative %: 81 %
Platelets: 243 10*3/uL (ref 150–400)
RBC: 2.91 MIL/uL — ABNORMAL LOW (ref 3.87–5.11)
RDW: 16 % — ABNORMAL HIGH (ref 11.5–15.5)
WBC: 9.7 10*3/uL (ref 4.0–10.5)
nRBC: 0 % (ref 0.0–0.2)

## 2024-04-16 LAB — BASIC METABOLIC PANEL WITH GFR
Anion gap: 14 (ref 5–15)
BUN: 42 mg/dL — ABNORMAL HIGH (ref 8–23)
CO2: 21 mmol/L — ABNORMAL LOW (ref 22–32)
Calcium: 8.9 mg/dL (ref 8.9–10.3)
Chloride: 111 mmol/L (ref 98–111)
Creatinine, Ser: 0.66 mg/dL (ref 0.44–1.00)
GFR, Estimated: >60 mL/min
Glucose, Bld: 122 mg/dL — ABNORMAL HIGH (ref 70–99)
Potassium: 4 mmol/L (ref 3.5–5.1)
Sodium: 146 mmol/L — ABNORMAL HIGH (ref 135–145)

## 2024-04-16 LAB — MRSA NEXT GEN BY PCR, NASAL: MRSA by PCR Next Gen: NOT DETECTED

## 2024-04-16 MED ORDER — LACTATED RINGERS IV SOLN: INTRAVENOUS | Status: AC

## 2024-04-16 MED ORDER — ENOXAPARIN SODIUM 40 MG/0.4ML IJ SOSY
40.0000 mg | PREFILLED_SYRINGE | INTRAMUSCULAR | Status: DC
  Administered 2024-04-16 – 2024-04-20 (×5): 40 mg via SUBCUTANEOUS
  Filled 2024-04-16 (×5): qty 0.4

## 2024-04-16 NOTE — Hospital Course (Addendum)
 Partly taken from prior notes.  Katrina Simon is a 74 y.o. female with medical history significant for Dementia, COPD, achalasia/Barrett's with prior myotomy, dysphagia with chronic cough, unintentional weight loss with severe protein calorie malnutrition, being admitted with RSV pneumonia and acute respiratory failure.  Patient was recently given prednisone  and antibiotics by her pulmonologist but she continued to have shortness of breath and cough.  They checked her O2 sats and they were in 70s so they brought her to ED by private vehicle.  Patient also has a presyncopal episode few days ago and had a Holter monitor placed at her cardiologist office a day prior to current admission.  On presentation patient was hypoxic in the 80s and was placed on 4 L of oxygen.  Rest of the vital stable. VBG unremarkable, mild leukocytosis at 11.7, respiratory panel positive for RSV. Chest x-ray with patchy airspace opacity in the left lower lobe suspicious for infection.  Patient was started on Rocephin , doxycycline  and was also given Solu-Medrol  and DuoNebs.  1/31: Vital stable on 2 L of oxygen, no baseline use.  Procalcitonin was 0.46, urine cultures pending. Mild hypernatremia with sodium at 146, CO2 21, creatinine improved.  MRSA PCR ordered PT is recommending home health-ordered  1/2: Hemodynamically stable continue to require 2 L of oxygen-Home oxygen ordered.   Urine cultures with Klebsiella pneumonia which shows resistant to ampicillin and nitrofurantoin . Husband requesting to stay another night due to icy roads around his neighborhood.  Will go home tomorrow morning.

## 2024-04-16 NOTE — Plan of Care (Signed)

## 2024-04-16 NOTE — Assessment & Plan Note (Signed)
 UA concerning for UTI, pending urine cultures. - Patient is on ceftriaxone  -Follow-up urine culture results

## 2024-04-16 NOTE — Progress Notes (Signed)
 " Progress Note   Patient: Katrina Simon FMW:[id number] DOB: [birth year] DOA: 04/15/2024     0 DOS: the patient was seen and examined on 04/16/2024   Brief hospital course: Partly taken from prior notes.  TERRE HANNEMAN is a 74 y.o. female with medical history significant for Dementia, COPD, achalasia/Barrett's with prior myotomy, dysphagia with chronic cough, unintentional weight loss with severe protein calorie malnutrition, being admitted with RSV pneumonia and acute respiratory failure.  Patient was recently given prednisone  and antibiotics by her pulmonologist but she continued to have shortness of breath and cough.  They checked her O2 sats and they were in 70s so they brought her to ED by private vehicle.  Patient also has a presyncopal episode few days ago and had a Holter monitor placed at her cardiologist office a day prior to current admission.  On presentation patient was hypoxic in the 80s and was placed on 4 L of oxygen.  Rest of the vital stable. VBG unremarkable, mild leukocytosis at 11.7, respiratory panel positive for RSV. Chest x-ray with patchy airspace opacity in the left lower lobe suspicious for infection.  Patient was started on Rocephin , doxycycline  and was also given Solu-Medrol  and DuoNebs.  1/31: Vital stable on 2 L of oxygen, no baseline use.  Procalcitonin was 0.46, urine cultures pending. Mild hypernatremia with sodium at 146, CO2 21, creatinine improved.  MRSA PCR ordered PT is recommending home health  Assessment and Plan: * RSV (respiratory syncytial virus pneumonia) CAP / at risk for aspiration Acute respiratory failure with hypoxia Patient presented with cough, shortness of breath and wheezing not responding to outpatient treatment requiring 2 L to maintain sats in the high 90s(O2 sat 70s at home) Was evaluated by swallow team And found to have oropharyngeal phase dysphagia secondary to cognitive decline-started on dysphagia 1 diet with nectar thick  liquid Continue Rocephin  and doxycycline  Supplemental oxygen-wean as tolerated Will check MRSA PCR Droplet precaution  COPD with acute exacerbation (HCC) Nebulizers as needed and scheduled Will hold off on steroids in the setting of acute infection-not currently wheezing on exam  History of syncope Syncopal episode in the past week Currently with a Holter monitor since 04/13/2024   UTI (urinary tract infection) UA concerning for UTI, pending urine cultures. - Patient is on ceftriaxone  -Follow-up urine culture results  AKI (acute kidney injury) Prior history of laxative overuse and dehydration with poor p.o. intake. Creatinine has been improved Monitor renal function and avoid nephrotoxic  Hypotension Resolved.  Blood pressure now within goal.  Protein-calorie malnutrition, severe Frailty Unintentional weight loss BMI 14.57 Patient follows with gastroenterology, pulmonology and cardiology Recent GI note reviewed-husband deferring EGD/colonoscopy due to frailty and risk of anesthesia - Dietitian consult  Achalasia Short segment Barrett's esophagus Chronic cough No acute issues Increased risk of aspiration  Mixed Alzheimer's and vascular dementia (HCC) Continue Exelon  and sertraline  Delirium precautions  Chronic pain syndrome On chronic opiates Patient is on oxycodone  10 mg 3 times daily - Dose was decreased oxycodone  5 mg 3 times a day as needed-concern of increased somnolence and lethargy.   Subjective: Patient was seen and examined today.  No new concern.  Husband at bedside.  Physical Exam: Vitals:   04/15/24 2117 04/16/24 0000 04/16/24 0433 04/16/24 0912  BP: 123/63  115/65 120/64  Pulse: 97  (!) 58 66  Resp: 16  15 16   Temp: 99 F (37.2 C)  98 F (36.7 C) 97.7 F (36.5 C)  TempSrc:  SpO2: 90% 92% 93% 95%  Weight:      Height:       General.  Very frail and emaciated elderly lady, in no acute distress. Pulmonary.  Lungs clear bilaterally,  normal respiratory effort. CV.  Regular rate and rhythm, no JVD, rub or murmur. Abdomen.  Soft, nontender, nondistended, BS positive. CNS.  Alert and oriented to self.  No focal neurologic deficit. Extremities.  No edema,  pulses intact and symmetrical.   Data Reviewed: Prior data reviewed  Family Communication: Discussed with husband at bedside  Disposition: Status is: Observation The patient will require care spanning > 2 midnights and should be moved to inpatient because: Severity of illness  Planned Discharge Destination: Home with Home Health  DVT prophylaxis.  Lovenox  Time spent: 50 minutes  This record has been created using Conservation officer, historic buildings. Errors have been sought and corrected,but may not always be located. Such creation errors do not reflect on the standard of care.   Author: Amaryllis Dare, MD 04/16/2024 1:44 PM  For on call review [email or web address].  "

## 2024-04-17 LAB — URINE CULTURE: Culture: 80000 — AB

## 2024-04-17 MED ORDER — DOXYCYCLINE HYCLATE 100 MG PO TABS
100.0000 mg | ORAL_TABLET | Freq: Two times a day (BID) | ORAL | Status: DC
  Administered 2024-04-17 – 2024-04-19 (×5): 100 mg via ORAL
  Filled 2024-04-17 (×5): qty 1

## 2024-04-17 NOTE — Assessment & Plan Note (Signed)
 Nebulizers as needed and scheduled Will hold off on steroids in the setting of acute infection-not currently wheezing on exam

## 2024-04-17 NOTE — TOC Progression Note (Signed)
 Transition of Care [facility]) - Progression Note    Patient Details  Name: Katrina Simon MRN: [id number] Date of Birth: [birth year]  [facility] CM/SW Contact  Jacqulyn Cai, RN Phone Number: 04/17/2024, 5:02 PM  Clinical Narrative:    CM opened case for assessment of D/C planning needs, CM reviewed chart. Per provider patient will need oxygen on discharge and spouse is asking for DME delivery tomorrow due to weather.   Referral sent to [employer], referral accepted and process will deliver portable to hospital and follow up with family for concentrator.   Husband to provide transportation tomorrow.                    Expected Discharge Plan and Services        Discharge on 2/2 with family and oxygen.                                        Social Drivers of Health (SDOH) Interventions SDOH Screenings   Food Insecurity: No Food Insecurity (04/15/2024)  Housing: Low Risk (04/15/2024)  Transportation Needs: No Transportation Needs (04/15/2024)  Utilities: Not At Risk (04/15/2024)  Depression (PHQ2-9): Low Risk (01/05/2023)  Financial Resource Strain: High Risk (12/07/2023)   Received from [facility]  Physical Activity: Insufficiently Active (12/07/2023)   Received from [facility]  Social Connections: Socially Integrated (04/15/2024)  Stress: No Stress Concern Present (12/07/2023)   Received from [facility]  Tobacco Use: Medium Risk (04/15/2024)    Readmission Risk Interventions    05/18/2023   12:24 PM  Readmission Risk Prevention Plan  Transportation Screening Complete  PCP or Specialist Appt within 3-5 Days Complete  HRI or Home Care Consult Complete  Social Work Consult for Recovery Care Planning/Counseling Complete  Palliative Care Screening Complete  Medication Review Oceanographer) Complete

## 2024-04-17 NOTE — Assessment & Plan Note (Signed)
 CAP / at risk for aspiration Acute respiratory failure with hypoxia Patient presented with cough, shortness of breath and wheezing not responding to outpatient treatment requiring 2 L to maintain sats in the high 90s(O2 sat 70s at home) Was evaluated by swallow team And found to have oropharyngeal phase dysphagia secondary to cognitive decline-started on dysphagia 1 diet with nectar thick liquid. MRSA PCR negative Continue ceftriaxone  2 g IV daily and doxycycline  Supplemental oxygen-wean as tolerated Droplet precaution 2/2: day 4 of abx. Doxycycline  changed to PO on 2/1.   2/3: Day 5 of doxycycline .  Note to pharmacy that doxycycline  can be discontinued after 5 days course completion.

## 2024-04-17 NOTE — Progress Notes (Signed)
 PHARMACIST - PHYSICIAN COMMUNICATION DR:   [name] CONCERNING: Antibiotic IV to Oral Route Change Policy  RECOMMENDATION: This patient is receiving doxycycline  by the intravenous route.  Based on criteria approved by the Pharmacy and Therapeutics Committee, the antibiotic(s) is/are being converted to the equivalent oral dose form(s).   DESCRIPTION: These criteria include: Patient being treated for a respiratory tract infection, urinary tract infection, cellulitis or clostridium difficile associated diarrhea if on metronidazole The patient is not neutropenic and does not exhibit a GI malabsorption state The patient is eating (either orally or via tube) and/or has been taking other orally administered medications for a least 24 hours The patient is improving clinically and has a Tmax < 100.5  If you have questions about this conversion, please contact the Pharmacy Department  []   [phone] )  Katrina Simon [x]   [phone] )  Katrina Simon []   [phone] )  Katrina Simon []   [phone] )  Katrina Simon []   [phone] )  Katrina Simon  [name], PharmD, BCPS

## 2024-04-17 NOTE — Plan of Care (Signed)

## 2024-04-17 NOTE — Assessment & Plan Note (Signed)
 UA concerning for UTI, urine cultures with Klebsiella pneumonia which was resistant to ampicillin and nitrofurantoin . - Currently on ceftriaxone -can be discharged on Keflex to complete a total of 5-day course. 2/2: day 4 of cetriaxone 2 g IV  2/2: Day 5 of ceftriaxone , complete course today.

## 2024-04-17 NOTE — Progress Notes (Signed)
 SATURATION QUALIFICATIONS: (This note is used to comply with regulatory documentation for home oxygen)  Patient Saturations on Room Air at Rest = 84%   Patient Saturations on 2 Liters of oxygen  at Rest= 93%

## 2024-04-17 NOTE — Progress Notes (Addendum)
 " Progress Note   Patient: Katrina Simon FMW:[id number] DOB: 1951/03/06 DOA: 04/15/2024     1 DOS: the patient was seen and examined on 04/17/2024   Brief hospital course: Partly taken from prior notes.  Katrina Simon is a 74 y.o. female with medical history significant for Dementia, COPD, achalasia/Barrett's with prior myotomy, dysphagia with chronic cough, unintentional weight loss with severe protein calorie malnutrition, being admitted with RSV pneumonia and acute respiratory failure.  Patient was recently given prednisone  and antibiotics by her pulmonologist but she continued to have shortness of breath and cough.  They checked her O2 sats and they were in 70s so they brought her to ED by private vehicle.  Patient also has a presyncopal episode few days ago and had a Holter monitor placed at her cardiologist office a day prior to current admission.  On presentation patient was hypoxic in the 80s and was placed on 4 L of oxygen.  Rest of the vital stable. VBG unremarkable, mild leukocytosis at 11.7, respiratory panel positive for RSV. Chest x-ray with patchy airspace opacity in the left lower lobe suspicious for infection.  Patient was started on Rocephin , doxycycline  and was also given Solu-Medrol  and DuoNebs.  1/31: Vital stable on 2 L of oxygen, no baseline use.  Procalcitonin was 0.46, urine cultures pending. Mild hypernatremia with sodium at 146, CO2 21, creatinine improved.  MRSA PCR ordered PT is recommending home health-ordered  1/2: Hemodynamically stable continue to require 2 L of oxygen-Home oxygen ordered.   Urine cultures with Klebsiella pneumonia which shows resistant to ampicillin and nitrofurantoin . Husband requesting to stay another night due to icy roads around his neighborhood.  Will go home tomorrow morning.  Assessment and Plan: * RSV (respiratory syncytial virus pneumonia) CAP / at risk for aspiration Acute respiratory failure with hypoxia Patient presented with  cough, shortness of breath and wheezing not responding to outpatient treatment requiring 2 L to maintain sats in the high 90s(O2 sat 70s at home) Was evaluated by swallow team And found to have oropharyngeal phase dysphagia secondary to cognitive decline-started on dysphagia 1 diet with nectar thick liquid. MRSA PCR negative Continue Rocephin  and doxycycline  Supplemental oxygen-wean as tolerated Droplet precaution  COPD with acute exacerbation (HCC) Nebulizers as needed and scheduled Will hold off on steroids in the setting of acute infection-not currently wheezing on exam  History of syncope Syncopal episode in the past week Currently with a Holter monitor since 04/13/2024   UTI (urinary tract infection) UA concerning for UTI, urine cultures with Klebsiella pneumonia which was resistant to ampicillin and nitrofurantoin . - Currently on ceftriaxone -can be discharged on Keflex to complete a total of 5-day course.  AKI (acute kidney injury) Prior history of laxative overuse and dehydration with poor p.o. intake. Creatinine has been improved Monitor renal function and avoid nephrotoxic  Hypotension Resolved.  Blood pressure now within goal.  Protein-calorie malnutrition, severe Frailty Unintentional weight loss BMI 14.57 Patient follows with gastroenterology, pulmonology and cardiology Recent GI note reviewed-husband deferring EGD/colonoscopy due to frailty and risk of anesthesia - Dietitian consult  Achalasia Short segment Barrett's esophagus Chronic cough No acute issues Increased risk of aspiration  Mixed Alzheimer's and vascular dementia (HCC) Continue Exelon  and sertraline  Delirium precautions  Chronic pain syndrome On chronic opiates Patient is on oxycodone  10 mg 3 times daily - Dose was decreased oxycodone  5 mg 3 times a day as needed-concern of increased somnolence and lethargy.   Subjective: Patient was seen and examined today.  No  new concern.  Continue to  need 2 L of oxygen.  Husband okay taking her home with oxygen but requesting discharge tomorrow due to bad roads today.  Physical Exam: Vitals:   04/16/24 2110 04/17/24 0434 04/17/24 0900 04/17/24 0905  BP: 137/74 128/62    Pulse: 84 64    Resp: 18 17    Temp: 98.3 F (36.8 C) 98 F (36.7 C)    TempSrc: Axillary     SpO2: 95% 91% (!) 84% 93%  Weight:      Height:       General.  Frail, emaciated elderly lady, in no acute distress. Pulmonary.  Lungs clear bilaterally, normal respiratory effort. CV.  Regular rate and rhythm, no JVD, rub or murmur. Abdomen.  Soft, nontender, nondistended, BS positive. CNS.  Alert and oriented to self only.  No focal neurologic deficit. Extremities.  No edema,  pulses intact and symmetrical.   Data Reviewed: Prior data reviewed  Family Communication: Discussed with husband at bedside  Disposition: Status is: Inpatient  inpatient because: Severity of illness  Planned Discharge Destination: Home with Home Health  DVT prophylaxis.  Lovenox  Time spent: 44 minutes  This record has been created using Conservation officer, historic buildings. Errors have been sought and corrected,but may not always be located. Such creation errors do not reflect on the standard of care.   Author: Amaryllis Dare, MD 04/17/2024 1:46 PM  For on call review [email or web address].  "

## 2024-04-18 ENCOUNTER — Inpatient Hospital Stay

## 2024-04-18 DIAGNOSIS — J121 Respiratory syncytial virus pneumonia: Secondary | ICD-10-CM | POA: Diagnosis not present

## 2024-04-18 MED ORDER — ADULT MULTIVITAMIN W/MINERALS CH
1.0000 | ORAL_TABLET | Freq: Every day | ORAL | Status: DC
  Administered 2024-04-18 – 2024-04-20 (×3): 1 via ORAL
  Filled 2024-04-18 (×3): qty 1

## 2024-04-18 MED ORDER — IPRATROPIUM-ALBUTEROL 0.5-2.5 (3) MG/3ML IN SOLN
3.0000 mL | Freq: Three times a day (TID) | RESPIRATORY_TRACT | Status: AC
  Administered 2024-04-18 – 2024-04-19 (×3): 3 mL via RESPIRATORY_TRACT
  Filled 2024-04-18 (×3): qty 3

## 2024-04-18 MED ORDER — IPRATROPIUM-ALBUTEROL 0.5-2.5 (3) MG/3ML IN SOLN
3.0000 mL | Freq: Four times a day (QID) | RESPIRATORY_TRACT | Status: DC
  Administered 2024-04-18: 3 mL via RESPIRATORY_TRACT
  Filled 2024-04-18: qty 3

## 2024-04-18 MED ORDER — IPRATROPIUM-ALBUTEROL 0.5-2.5 (3) MG/3ML IN SOLN
3.0000 mL | Freq: Four times a day (QID) | RESPIRATORY_TRACT | Status: AC | PRN
  Administered 2024-04-19: 3 mL via RESPIRATORY_TRACT
  Filled 2024-04-18: qty 3

## 2024-04-18 NOTE — Assessment & Plan Note (Signed)
 Frailty Unintentional weight loss BMI 14.57 Patient follows with gastroenterology, pulmonology and cardiology Recent GI note reviewed-husband deferring EGD/colonoscopy due to frailty and risk of anesthesia - Dietitian consult

## 2024-04-18 NOTE — Plan of Care (Signed)

## 2024-04-18 NOTE — Assessment & Plan Note (Signed)
 Still requiring 4 L nasal cannula

## 2024-04-18 NOTE — Assessment & Plan Note (Signed)
 Prior history of laxative overuse and dehydration with poor p.o. intake. Creatinine has been improved Monitor renal function and avoid nephrotoxic Resolved

## 2024-04-18 NOTE — Progress Notes (Signed)
 PT Cancellation Note  Patient Details Name: Katrina Simon MRN: [id number] DOB: 03-10-1951   Cancelled Treatment:    Reason Eval/Treat Not Completed: Other (comment): PT treatment attempted x 2 this date with pt's spouse assisting pt with eating breakfast in the AM during first attempt and pt out of room for procedure in the PM during second attempt.  Will attempt to see pt at a future date/time as medically appropriate.    CHARM Katrina Simon PT, DPT 04/18/24, 4:13 PM

## 2024-04-18 NOTE — Assessment & Plan Note (Signed)
 Short segment Barrett's esophagus Chronic cough No acute issues Increased risk of aspiration

## 2024-04-18 NOTE — Assessment & Plan Note (Signed)
 Continue supportive measures

## 2024-04-19 DIAGNOSIS — D72829 Elevated white blood cell count, unspecified: Secondary | ICD-10-CM

## 2024-04-19 DIAGNOSIS — J121 Respiratory syncytial virus pneumonia: Secondary | ICD-10-CM | POA: Diagnosis not present

## 2024-04-19 LAB — CBC
HCT: 30 % — ABNORMAL LOW (ref 36.0–46.0)
Hemoglobin: 9.8 g/dL — ABNORMAL LOW (ref 12.0–15.0)
MCH: 27.4 pg (ref 26.0–34.0)
MCHC: 32.7 g/dL (ref 30.0–36.0)
MCV: 83.8 fL (ref 80.0–100.0)
Platelets: 378 10*3/uL (ref 150–400)
RBC: 3.58 MIL/uL — ABNORMAL LOW (ref 3.87–5.11)
RDW: 15.9 % — ABNORMAL HIGH (ref 11.5–15.5)
WBC: 16.4 10*3/uL — ABNORMAL HIGH (ref 4.0–10.5)
nRBC: 0 % (ref 0.0–0.2)

## 2024-04-19 LAB — COMPREHENSIVE METABOLIC PANEL WITH GFR
ALT: 13 U/L (ref 0–44)
AST: 18 U/L (ref 15–41)
Albumin: 2.9 g/dL — ABNORMAL LOW (ref 3.5–5.0)
Alkaline Phosphatase: 97 U/L (ref 38–126)
Anion gap: 16 — ABNORMAL HIGH (ref 5–15)
BUN: 29 mg/dL — ABNORMAL HIGH (ref 8–23)
CO2: 23 mmol/L (ref 22–32)
Calcium: 8.6 mg/dL — ABNORMAL LOW (ref 8.9–10.3)
Chloride: 104 mmol/L (ref 98–111)
Creatinine, Ser: 0.56 mg/dL (ref 0.44–1.00)
GFR, Estimated: >60 mL/min
Glucose, Bld: 107 mg/dL — ABNORMAL HIGH (ref 70–99)
Potassium: 3.4 mmol/L — ABNORMAL LOW (ref 3.5–5.1)
Sodium: 143 mmol/L (ref 135–145)
Total Bilirubin: <0.2 mg/dL (ref 0.0–1.2)
Total Protein: 6.6 g/dL (ref 6.5–8.1)

## 2024-04-19 MED ORDER — POTASSIUM CHLORIDE 10 MEQ/100ML IV SOLN
10.0000 meq | Freq: Once | INTRAVENOUS | Status: AC
  Administered 2024-04-19: 10 meq via INTRAVENOUS
  Filled 2024-04-19: qty 100

## 2024-04-19 MED ORDER — SODIUM CHLORIDE 0.9 % IV SOLN: INTRAVENOUS | Status: DC

## 2024-04-19 NOTE — Care Management Important Message (Signed)
 Important Message  Patient Details  Name: Katrina Simon MRN: [id number] Date of Birth: 05/28/1950   Important Message Given:  Yes - Medicare IM     Trek Kimball 04/19/2024, 1:58 PM

## 2024-04-19 NOTE — Plan of Care (Signed)
  Problem: Coping: Goal: Level of anxiety will decrease Outcome: Progressing   Problem: Elimination: Goal: Will not experience complications related to bowel motility Outcome: Progressing Goal: Will not experience complications related to urinary retention Outcome: Progressing   Problem: Pain Managment: Goal: General experience of comfort will improve and/or be controlled Outcome: Progressing   Problem: Safety: Goal: Ability to remain free from injury will improve Outcome: Progressing   Problem: Skin Integrity: Goal: Risk for impaired skin integrity will decrease Outcome: Progressing   Problem: Respiratory: Goal: Levels of oxygenation will improve Outcome: Progressing

## 2024-04-19 NOTE — Progress Notes (Signed)
 Physical Therapy Treatment Patient Details Name: Katrina Simon MRN: [id number] DOB: [birth year] Today's Date: 04/19/2024   History of Present Illness Pt is a 74 y.o. female with medical history significant for dementia, COPD, achalasia/Barrett's with prior myotomy, dysphagia with chronic cough, unintentional weight loss with severe protein calorie malnutrition, being admitted with RSV pneumonia and acute respiratory failure.  MD assessment includes: acute respiratory failure with hypoxia, RSV infection, LLL PNA, COPD exacerbation, UTI, AKI, hypotension, and recent syncope.    PT Comments  Pt mostly non-verbal during the session and despite max multi-modal cuing had difficulty following commands.  Pt found with [place] removed and placed over her forehead with SpO2 on room air 90%.  Spoke to nursing who gave ok for trial amb on room air secondary to pt constantly removing [place].  Pt required heavy +2 assist with all functional tasks per below and ambulated with scissoring gait pattern initially that did slowly improve.  Pt's SpO2 dropped to a low of 83% during ambulation and increased quickly back to 89-90% once [place] placed back on.  Pt will benefit from continued PT services upon discharge to safely address deficits listed in patient problem list for decreased caregiver assistance and eventual return to PLOF.      If plan is discharge home, recommend the following: A lot of help with bathing/dressing/bathroom;Assistance with cooking/housework;Assistance with feeding;Direct supervision/assist for medications management;Direct supervision/assist for financial management;Assist for transportation;Help with stairs or ramp for entrance;Supervision due to cognitive status;A lot of help with walking and/or transfers   Can travel by private vehicle        Equipment Recommendations       Recommendations for Other Services       Precautions / Restrictions Precautions Precautions: Fall Restrictions Weight Bearing  Restrictions Per Provider Order: No     Mobility  Bed Mobility Overal bed mobility: Needs Assistance Bed Mobility: Supine to Sit, Sit to Supine     Supine to sit: +2 for physical assistance, Total assist Sit to supine: Total assist, +2 for physical assistance   General bed mobility comments: Total assist for BLE and trunk control    Transfers Overall transfer level: Needs assistance Equipment used: 2 person hand held assist Transfers: Sit to/from Stand Sit to Stand: Mod assist, +2 physical assistance           General transfer comment: Pt with posterior lean in sitting and standing requiring constand min to mod A to correct    Ambulation/Gait Ambulation/Gait assistance: Mod assist, +2 physical assistance Gait Distance (Feet): 6 Feet Assistive device: 2 person hand held assist (with chair follow) Gait Pattern/deviations: Decreased step length - right, Decreased step length - left, Leaning posteriorly, Step-through pattern, Scissoring, Ataxic Gait velocity: decreased     General Gait Details: Pt required heavy +2 assist for stability during gait with ataxic, scissoring steps initially that gradually improved, baseline per spouse in room   Stairs             Wheelchair Mobility     Tilt Bed    Modified Rankin (Stroke Patients Only)       Balance Overall balance assessment: Needs assistance Sitting-balance support: Single extremity supported, Feet supported Sitting balance-Leahy Scale: Poor Sitting balance - Comments: Posterior instability Postural control: Posterior lean Standing balance support: Bilateral upper extremity supported, During functional activity Standing balance-Leahy Scale: Poor  Communication Communication Communication: Impaired Factors Affecting Communication: Difficulty expressing self  Cognition Arousal: Alert Behavior During Therapy: WFL for tasks assessed/performed   PT - Cognitive  impairments: History of cognitive impairments                       PT - Cognition Comments: Non verbal, A&O x 0 Following commands: Impaired Following commands impaired: Follows one step commands inconsistently    Cueing Cueing Techniques: Verbal cues, Tactile cues  Exercises      General Comments        Pertinent Vitals/Pain Pain Assessment Pain Assessment: PAINAD Breathing: normal Negative Vocalization: none Facial Expression: smiling or inexpressive Body Language: relaxed Consolability: no need to console PAINAD Score: 0 Pain Intervention(s): Monitored during session    Home Living                          Prior Function            PT Goals (current goals can now be found in the care plan section) Progress towards PT goals: Progressing toward goals    Frequency    Min 1X/week      PT Plan      Co-evaluation              AM-PAC PT 6 Clicks Mobility   Outcome Measure  Help needed turning from your back to your side while in a flat bed without using bedrails?: A Lot Help needed moving from lying on your back to sitting on the side of a flat bed without using bedrails?: Total Help needed moving to and from a bed to a chair (including a wheelchair)?: Total Help needed standing up from a chair using your arms (e.g., wheelchair or bedside chair)?: Total Help needed to walk in hospital room?: Total Help needed climbing 3-5 steps with a railing? : Total 6 Click Score: 7    End of Session Equipment Utilized During Treatment: Gait belt Activity Tolerance: Patient tolerated treatment well Patient left: in bed;with call bell/phone within reach;with family/visitor present;with bed alarm set Nurse Communication: Mobility status (SpO2 with ambulation per above) PT Visit Diagnosis: Unsteadiness on feet (R26.81);Difficulty in walking, not elsewhere classified (R26.2);Muscle weakness (generalized) (M62.81)     Time: 8891-8873 PT Time  Calculation (min) (ACUTE ONLY): 18 min  Charges:    $Gait Training: 8-22 mins PT General Charges $$ ACUTE PT VISIT: 1 Visit                     D. Scott Andriy Sherk PT, DPT 04/19/24, 11:55 AM

## 2024-04-19 NOTE — Assessment & Plan Note (Signed)
 Suspect demargination Patient clinically is improving

## 2024-04-20 MED ORDER — IPRATROPIUM-ALBUTEROL 0.5-2.5 (3) MG/3ML IN SOLN: 3.0000 mL | Freq: Four times a day (QID) | RESPIRATORY_TRACT | 1 refills | Status: AC | PRN

## 2024-04-20 MED ORDER — GUAIFENESIN-DM 100-10 MG/5ML PO SYRP: 5.0000 mL | ORAL_SOLUTION | ORAL | 0 refills | Status: AC | PRN

## 2024-04-20 NOTE — Discharge Summary (Signed)
 " Physician Discharge Summary   Patient: Katrina Simon MRN: [id number] DOB: 06/10/1950  Admit date:     04/15/2024  Discharge date: 04/20/24  Discharge Physician: Leita Blanch   PCP: System, Provider Not In   Recommendations at discharge:   patient to follow-up with PCP in [facility] [place]  in 1 to 2 week use your inhaler oxygen as instructed  Discharge Diagnoses: Principal Problem:   RSV (respiratory syncytial virus pneumonia) Active Problems:   Acute hypoxemic respiratory failure (HCC)   COPD with acute exacerbation (HCC)   UTI (urinary tract infection)   Chronic pain syndrome   Hypotension   History of syncope   RSV (acute bronchiolitis due to respiratory syncytial virus)   Leukocytosis   Achalasia   Protein-calorie malnutrition, severe   Mixed Alzheimer's and vascular dementia (HCC)   AKI (acute kidney injury)   Chronic, continuous use of opioids   Respiratory infection   Acute cough  . Katrina Simon is a 74 y.o. female with Dementia, COPD, achalasia/Barrett's with prior myotomy, dysphagia with chronic cough, unintentional weight loss with severe protein calorie malnutrition, being admitted with RSV pneumonia and acute respiratory failure.  Patient was recently given prednisone  and antibiotics by her pulmonologist but she continued to have shortness of breath and cough.  spO2 sats and they were in 70s so they brought her to ED by private vehicle.   RSV (respiratory syncytial virus pneumonia) CAP / at risk for aspiration Acute respiratory failure with hypoxia Patient presented with cough, shortness of breath and wheezing not responding to outpatient treatment requiring 2 L to maintain sats in the high 90s(O2 sat 70s at home) Was evaluated by SLP and found to have oropharyngeal phase dysphagia secondary to cognitive decline-started on dysphagia 1 diet with nectar thick liquid. MRSA PCR negative Completed po abx course   UTI (urinary tract infection) UA concerning for  UTI, urine cultures with Klebsiella pneumonia which was resistant to ampicillin and nitrofurantoin . -completed abx course   Acute hypoxemic respiratory failure (HCC) COPD with acute exacerbation (HCC) Nebulizers as needed and scheduled Home oxygen has been set up  Leukocytosis Suspect demargination--was on steroids Patient clinically is improving   RSV (acute bronchiolitis due to respiratory syncytial virus) Continue supportive measures   History of syncope Syncopal episode in the past week Currently with a Holter monitor since 04/13/2024   Hypotension Resolved.    Chronic pain syndrome On chronic opiates Patient is on oxycodone  10 mg 3 times daily   Mixed Alzheimer's and vascular dementia (HCC) Continue Exelon  and sertraline  Delirium precautions   Protein-calorie malnutrition, severe Frailty Unintentional weight loss BMI 14.57 Patient follows with gastroenterology, pulmonology and cardiology Recent GI note reviewed-husband deferring EGD/colonoscopy due to frailty and risk of anesthesia  Achalasia Short segment Barrett's esophagus Chronic cough No acute issues Increased risk of aspiration   AKI (acute kidney injury) Creat at baseline 0.56  Patient overall is at baseline. Discussed discharge plan with husband at bedside he is agreeable. Home health and DME arranged by TOC. Will discharge to home with outpatient follow-up PCP     DVT prophylaxis: Enoxaparin  Code Status: Full code Family Communication: Spouse at bedside has been updated    Pain control - [place]  Controlled Substance Reporting System database was reviewed. and patient was instructed, not to drive, operate heavy machinery, perform activities at heights, swimming or participation in water  activities or provide baby-sitting services while on Pain, Sleep and Anxiety Medications; until their outpatient Physician has advised to do  so again. Also recommended to not to take more than prescribed Pain,  Sleep and Anxiety Medications.    Disposition: Home health Diet recommendation:  Regular diet DISCHARGE MEDICATION: Allergies as of 04/20/2024       Reactions   Penicillins Hives, Rash   Other Reaction(s): Other (See Comments)   Statins Other (See Comments)   Myalgia        Medication List     STOP taking these medications    azithromycin  250 MG tablet Commonly known as: ZITHROMAX        TAKE these medications    acetaminophen  325 MG tablet Commonly known as: TYLENOL  Take 2 tablets (650 mg total) by mouth every 6 (six) hours as needed for mild pain (pain score 1-3), moderate pain (pain score 4-6), fever or headache (or Fever >/= 101).   aspirin  EC 81 MG tablet Take 81 mg by mouth daily. Swallow whole.   Cholecalciferol  50 MCG (2000 UT) Tabs Take 2,000 Units by mouth at bedtime.   diclofenac  Sodium 1 % Gel Commonly known as: Voltaren  Apply 4 g topically 4 (four) times daily. What changed:  when to take this reasons to take this   folic acid  400 MCG tablet Commonly known as: FOLVITE  Take 400 mcg by mouth at bedtime.   guaiFENesin -dextromethorphan  100-10 MG/5ML syrup Commonly known as: ROBITUSSIN DM Take 5 mLs by mouth every 4 (four) hours as needed for cough.   ipratropium-albuterol  0.5-2.5 (3) MG/3ML Soln Commonly known as: DUONEB Inhale 3 mLs into the lungs every 6 (six) hours as needed.   levETIRAcetam  500 MG tablet Commonly known as: KEPPRA  Take 500 mg by mouth 2 (two) times daily.   lidocaine  5 % Commonly known as: LIDODERM  Place 1 patch onto the skin daily as needed.   magnesium  hydroxide 400 MG/5ML suspension Commonly known as: MILK OF MAGNESIA Take 30 mLs by mouth daily as needed for mild constipation.   mirtazapine  15 MG tablet Commonly known as: REMERON  Take 15 mg by mouth at bedtime.   naloxone  4 MG/0.1ML Liqd nasal spray kit Commonly known as: NARCAN  As directed for opioid induced respiratory depression   Oxycodone  HCl 10 MG  Tabs Take 1 tablet (10 mg total) by mouth in the morning, at noon, and at bedtime.   Oxycodone  HCl 10 MG Tabs Take 1 tablet (10 mg total) by mouth in the morning, at noon, and at bedtime. Start taking on: April 30, 2024   pantoprazole  40 MG tablet Commonly known as: PROTONIX  Take by mouth.   rivastigmine  1.5 MG capsule Commonly known as: EXELON  Take 1.5 mg by mouth 2 (two) times daily.   rosuvastatin  5 MG tablet Commonly known as: CRESTOR  Take 5 mg by mouth daily.   sertraline  100 MG tablet Commonly known as: ZOLOFT  Take 100 mg by mouth at bedtime. What changed: how much to take   Trelegy Ellipta  200-62.5-25 MCG/ACT Aepb Generic drug: Fluticasone -Umeclidin-Vilant Inhale 1 puff into the lungs daily.               Durable Medical Equipment  (From admission, onward)           Start     Ordered   04/17/24 1127  For home use only DME oxygen  Once       Question Answer Comment  Length of Need Lifetime   Mode or (Route) Nasal cannula   Liters per Minute 2   Frequency Continuous (stationary and portable oxygen unit needed)   Oxygen conserving device Yes  Oxygen delivery system: Gas   Oxygen delivery system: Concentrator   Oxygen delivery system: Portable concentrator (POC)      04/17/24 1126            Contact information for after-discharge care     Home Medical Care     [facility]) .   Service: Home Health Services Contact information: [address] [place]  [address] [phone]                      Condition at discharge: fair  The results of significant diagnostics from this hospitalization (including imaging, microbiology, ancillary and laboratory) are listed below for reference.   Imaging Studies: CT HEAD WO CONTRAST ( ) Result Date: 04/18/2024 EXAM: CT HEAD WITHOUT CONTRAST 04/18/2024 02:44:30 PM TECHNIQUE: CT of the head was performed without the  administration of intravenous contrast. Automated exposure control, iterative reconstruction, and/or weight based adjustment of the mA/kV was utilized to reduce the radiation dose to as low as reasonably achievable. COMPARISON: Head CT 11/10/2023 and MRI 09/17/2023. CLINICAL HISTORY: Difficulty walking starting on 04/07/24. FINDINGS: BRAIN AND VENTRICLES: There is no evidence of an acute infarct, intracranial hemorrhage, mass, midline shift, hydrocephalus, or extra-axial fluid collection. Patchy to confluent hypodensities in the cerebral white matter are slightly more prominent and nonspecific but compatible with moderate chronic small vessel ischemic disease. 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 There is mild to moderate cerebral atrophy. Calcified atherosclerosis at the skull base. ORBITS: No acute abnormality. SINUSES: Mild mucosal thickening in the paranasal sinuses. Clear mastoid air cells. SOFT TISSUES AND SKULL: No acute soft tissue abnormality. No skull fracture. IMPRESSION: 1. No acute intracranial abnormality. 2. Moderate chronic small vessel ischemic disease. 3. Cerebral Atrophy (ICD10-G31.9). Electronically signed by: Dasie Hamburg MD 04/18/2024 04:01 PM EST RP Workstation: [id number]   DG Chest 1 View Result Date: 04/15/2024 EXAM: 1 VIEW(S) XRAY OF THE CHEST 04/15/2024 01:12:00 AM COMPARISON: 11/10/2023 CLINICAL HISTORY: Cough; infection; chronic obstructive pulmonary disease; shortness of breath. FINDINGS: LUNGS AND PLEURA: Patchy airspace opacity in left lower lobe. Chronic coarsened interstitial markings. No pleural effusion. No pneumothorax. HEART AND MEDIASTINUM: Atherosclerotic plaque. No acute abnormality of the cardiac and mediastinal silhouettes. BONES AND SOFT TISSUES: No acute osseous abnormality. IMPRESSION: 1. Patchy airspace opacity in the left lower lobe, suspicious for infection. 2. Chronic coarsened interstitial markings stable from the prior exam. Electronically  signed by: Oneil Devonshire MD 04/15/2024 01:19 AM EST RP Workstation: [id number]    Microbiology: Results for orders placed or performed during the hospital encounter of 04/15/24  Resp panel by RT-PCR (RSV, Flu A&B, Covid) Anterior Nasal Swab     Status: Abnormal   Collection Time: 04/15/24 12:49 AM   Specimen: Anterior Nasal Swab  Result Value Ref Range Status   SARS Coronavirus 2 by RT PCR NEGATIVE NEGATIVE Final    Comment: (NOTE) SARS-CoV-2 target nucleic acids are NOT DETECTED.  The SARS-CoV-2 RNA is generally detectable in upper respiratory specimens during the acute phase of infection. The lowest concentration of SARS-CoV-2 viral copies this assay can detect is 138 copies/mL. A negative result does not preclude SARS-Cov-2 infection and should not be used as the sole basis for treatment or other patient management decisions. A negative result may occur with  improper specimen collection/handling, submission of specimen other than nasopharyngeal swab, presence of viral mutation(s) within the areas targeted by this assay, and inadequate number of viral copies(<138 copies/mL). A negative  result must be combined with clinical observations, patient history, and epidemiological information. The expected result is Negative.  Fact Sheet for Patients:  bloggercourse.com  Fact Sheet for Healthcare Providers:  seriousbroker.it  This test is no t yet approved or cleared by the [place]  FDA and  has been authorized for detection and/or diagnosis of SARS-CoV-2 by FDA under an Emergency Use Authorization (EUA). This EUA will remain  in effect (meaning this test can be used) for the duration of the COVID-19 declaration under Section 564(b)(1) of the Act, 21 U.S.C.section 360bbb-3(b)(1), unless the authorization is terminated  or revoked sooner.       Influenza A by PCR NEGATIVE NEGATIVE Final   Influenza B by PCR NEGATIVE NEGATIVE  Final    Comment: (NOTE) The Xpert Xpress SARS-CoV-2/FLU/RSV plus assay is intended as an aid in the diagnosis of influenza from Nasopharyngeal swab specimens and should not be used as a sole basis for treatment. Nasal washings and aspirates are unacceptable for Xpert Xpress SARS-CoV-2/FLU/RSV testing.  Fact Sheet for Patients: bloggercourse.com  Fact Sheet for Healthcare Providers: seriousbroker.it  This test is not yet approved or cleared by the [place]  FDA and has been authorized for detection and/or diagnosis of SARS-CoV-2 by FDA under an Emergency Use Authorization (EUA). This EUA will remain in effect (meaning this test can be used) for the duration of the COVID-19 declaration under Section 564(b)(1) of the Act, 21 U.S.C. section 360bbb-3(b)(1), unless the authorization is terminated or revoked.     Resp Syncytial Virus by PCR POSITIVE (A) NEGATIVE Final    Comment: (NOTE) Fact Sheet for Patients: bloggercourse.com  Fact Sheet for Healthcare Providers: seriousbroker.it  This test is not yet approved or cleared by the [place]  FDA and has been authorized for detection and/or diagnosis of SARS-CoV-2 by FDA under an Emergency Use Authorization (EUA). This EUA will remain in effect (meaning this test can be used) for the duration of the COVID-19 declaration under Section 564(b)(1) of the Act, 21 U.S.C. section 360bbb-3(b)(1), unless the authorization is terminated or revoked.  Performed at [facility], [address] [place], [place] [address]   Urine Culture (for pregnant, neutropenic or urologic patients or patients with an indwelling urinary catheter)     Status: Abnormal   Collection Time: 04/15/24  9:00 AM   Specimen: Urine, Clean Catch  Result Value Ref Range Status   Specimen Description   Final    URINE, CLEAN CATCH Performed at [facility], [address], [place], [place] [address]    Special Requests   Final    NONE Performed at [facility], [address], [place], [place] [address]    Culture 80,000 COLONIES/mL KLEBSIELLA PNEUMONIAE (A)  Final   Report Status 04/17/2024 FINAL  Final   Organism ID, Bacteria KLEBSIELLA PNEUMONIAE (A)  Final      Susceptibility   Klebsiella pneumoniae - MIC*    AMPICILLIN RESISTANT Resistant     CEFAZOLIN  (URINE) Value in next row Sensitive      2 SENSITIVEThis is a modified FDA-approved test that has been validated and its performance characteristics determined by the reporting laboratory.  This laboratory is certified under the Clinical Laboratory Improvement Amendments CLIA as qualified to perform high complexity clinical laboratory testing.    CEFEPIME Value in next row Sensitive      2 SENSITIVEThis is a modified FDA-approved test that has been validated and its performance characteristics determined by the reporting laboratory.  This laboratory  is certified under the Clinical Laboratory Improvement Amendments CLIA as qualified to perform high complexity clinical laboratory testing.    ERTAPENEM Value in next row Sensitive      2 SENSITIVEThis is a modified FDA-approved test that has been validated and its performance characteristics determined by the reporting laboratory.  This laboratory is certified under the Clinical Laboratory Improvement Amendments CLIA as qualified to perform high complexity clinical laboratory testing.    CEFTRIAXONE  Value in next row Sensitive      2 SENSITIVEThis is a modified FDA-approved test that has been validated and its performance characteristics determined by the reporting laboratory.  This laboratory is certified under the Clinical Laboratory Improvement Amendments CLIA as qualified to perform high complexity clinical laboratory testing.    CIPROFLOXACIN Value in next row Sensitive      2 SENSITIVEThis is a modified FDA-approved  test that has been validated and its performance characteristics determined by the reporting laboratory.  This laboratory is certified under the Clinical Laboratory Improvement Amendments CLIA as qualified to perform high complexity clinical laboratory testing.    GENTAMICIN Value in next row Sensitive      2 SENSITIVEThis is a modified FDA-approved test that has been validated and its performance characteristics determined by the reporting laboratory.  This laboratory is certified under the Clinical Laboratory Improvement Amendments CLIA as qualified to perform high complexity clinical laboratory testing.    NITROFURANTOIN  Value in next row Intermediate      2 SENSITIVEThis is a modified FDA-approved test that has been validated and its performance characteristics determined by the reporting laboratory.  This laboratory is certified under the Clinical Laboratory Improvement Amendments CLIA as qualified to perform high complexity clinical laboratory testing.    TRIMETH/SULFA Value in next row Sensitive      2 SENSITIVEThis is a modified FDA-approved test that has been validated and its performance characteristics determined by the reporting laboratory.  This laboratory is certified under the Clinical Laboratory Improvement Amendments CLIA as qualified to perform high complexity clinical laboratory testing.    AMPICILLIN/SULBACTAM Value in next row Sensitive      2 SENSITIVEThis is a modified FDA-approved test that has been validated and its performance characteristics determined by the reporting laboratory.  This laboratory is certified under the Clinical Laboratory Improvement Amendments CLIA as qualified to perform high complexity clinical laboratory testing.    PIP/TAZO Value in next row Sensitive      <=4 SENSITIVEThis is a modified FDA-approved test that has been validated and its performance characteristics determined by the reporting laboratory.  This laboratory is certified under the Clinical  Laboratory Improvement Amendments CLIA as qualified to perform high complexity clinical laboratory testing.    MEROPENEM Value in next row Sensitive      <=4 SENSITIVEThis is a modified FDA-approved test that has been validated and its performance characteristics determined by the reporting laboratory.  This laboratory is certified under the Clinical Laboratory Improvement Amendments CLIA as qualified to perform high complexity clinical laboratory testing.    * 80,000 COLONIES/mL KLEBSIELLA PNEUMONIAE  MRSA Next Gen by PCR, Nasal     Status: None   Collection Time: 04/16/24  1:45 PM   Specimen: Nasal Mucosa; Nasal Swab  Result Value Ref Range Status   MRSA by PCR Next Gen NOT DETECTED NOT DETECTED Final    Comment: (NOTE) The GeneXpert MRSA Assay (FDA approved for NASAL specimens only), is one component of a comprehensive MRSA colonization surveillance program. It is not intended to diagnose  MRSA infection nor to guide or monitor treatment for MRSA infections. Test performance is not FDA approved in patients less than 10 years old. Performed at [facility], [address] [place], [place] [address]     Labs: CBC: Recent Labs  Lab 04/15/24 0032 04/16/24 0533 04/19/24 1154  WBC 11.7* 9.7 16.4*  NEUTROABS  --  7.9*  --   HGB 9.1* 8.0* 9.8*  HCT 29.5* 26.0* 30.0*  MCV 88.3 89.3 83.8  PLT 285 243 378   Basic Metabolic Panel: Recent Labs  Lab 04/15/24 0032 04/16/24 0533 04/19/24 1154  NA 142 146* 143  K 4.0 4.0 3.4*  CL 104 111 104  CO2 24 21* 23  GLUCOSE 191* 122* 107*  BUN 50* 42* 29*  CREATININE 1.04* 0.66 0.56  CALCIUM  9.4 8.9 8.6*   Liver Function Tests: Recent Labs  Lab 04/15/24 0032 04/19/24 1154  AST 15 18  ALT 7 13  ALKPHOS 112 97  BILITOT 0.3 <0.2  PROT 7.8 6.6  ALBUMIN 3.6 2.9*    Discharge time spent: greater than 30 minutes.  Signed: Leita Blanch, MD [facility] 04/20/2024 "

## 2024-04-20 NOTE — Plan of Care (Signed)

## 2024-04-20 NOTE — TOC Transition Note (Signed)
 Transition of Care [facility]) - Discharge Note   Patient Details  Name: Katrina Simon MRN: [id number] Date of Birth: 1951/01/06  Transition of Care [facility]) CM/SW Contact:  Shasta DELENA Daring, RN Phone Number: 04/20/2024, 4:22 PM   Clinical Narrative:    Patient has discharge orders.   HH with [facility]. Notified agency rep of discharge.  O2 was delivered to the room yesterday.  Husband will provide transportation home.  Refused any additional DME.  No additional TOC needs.  RNCM signing off.   Final next level of care: Home w Home Health Services Barriers to Discharge: Barriers Resolved   Patient Goals and CMS Choice            Discharge Placement                Patient to be transferred to facility by: Husband Name of family member notified: [name] Patient and family notified of of transfer: 04/20/24  Discharge Plan and Services Additional resources added to the After Visit Summary for                  DME Arranged: Oxygen DME Agency: [facility] Date DME Agency Contacted: 04/17/24   Representative spoke with at DME Agency: Order emailed to [facility] HH Arranged: PT, OT, Speech Therapy HH Agency: [facility] Date [facility] Agency Contacted: 04/20/24 Time HH Agency Contacted: 1622 Representative spoke with at [facility] Agency: [place]   Social Drivers of Health (SDOH) Interventions SDOH Screenings   Food Insecurity: No Food Insecurity (04/15/2024)  Housing: Low Risk (04/15/2024)  Transportation Needs: No Transportation Needs (04/15/2024)  Utilities: Not At Risk (04/15/2024)  Depression (PHQ2-9): Low Risk (01/05/2023)  Financial Resource Strain: High Risk (12/07/2023)   Received from [facility]  Physical Activity: Insufficiently Active (12/07/2023)   Received from [facility]  Social Connections: Socially Integrated (04/15/2024)  Stress: No Stress Concern Present (12/07/2023)   Received from [facility]  Tobacco Use: Medium Risk (04/15/2024)      Readmission Risk Interventions    04/20/2024   11:31 AM 05/18/2023   12:24 PM  Readmission Risk Prevention Plan  Transportation Screening Complete Complete  PCP or Specialist Appt within 3-5 Days Complete Complete  HRI or Home Care Consult Complete Complete  Social Work Consult for Recovery Care Planning/Counseling  Complete  Palliative Care Screening Not Applicable Complete  Medication Review Oceanographer) Complete Complete

## 2024-04-20 NOTE — TOC Progression Note (Signed)
 Transition of Care [facility]) - Progression Note    Patient Details  Name: Katrina Simon MRN: [id number] Date of Birth: Jan 04, 1951  [facility]) CM/SW Contact  Shasta DELENA Daring, RN Phone Number: 04/20/2024, 11:31 AM  Clinical Narrative:     Readmission Risk assessment complete.   Patient lives in single family home with husband. Husband provides transportation. HH services aready established with [facility].  SLT added and confirmed with agency. DME in home includes wheelchair and shower chair. Does not want any more DME.  PCP is Lamarr Slade in [place] ([facility]).  No additional TOC needs.      Barriers to Discharge: Barriers Resolved               Expected Discharge Plan and Services                         DME Arranged: Oxygen DME Agency: [facility] Date DME Agency Contacted: 04/17/24   Representative spoke with at DME Agency: Order emailed to [facility] HH Arranged: PT HH Agency: [facility] Date [facility] Agency Contacted: 04/17/24   Representative spoke with at [facility] Agency: Referral Sent Via EPIC   Social Drivers of Health (SDOH) Interventions SDOH Screenings   Food Insecurity: No Food Insecurity (04/15/2024)  Housing: Low Risk (04/15/2024)  Transportation Needs: No Transportation Needs (04/15/2024)  Utilities: Not At Risk (04/15/2024)  Depression (PHQ2-9): Low Risk (01/05/2023)  Financial Resource Strain: High Risk (12/07/2023)   Received from [facility]  Physical Activity: Insufficiently Active (12/07/2023)   Received from [facility]  Social Connections: Socially Integrated (04/15/2024)  Stress: No Stress Concern Present (12/07/2023)   Received from [facility]  Tobacco Use: Medium Risk (04/15/2024)    Readmission Risk Interventions    04/20/2024   11:31 AM 05/18/2023   12:24 PM  Readmission Risk Prevention Plan  Transportation Screening Complete Complete  PCP or Specialist Appt within 3-5 Days Complete Complete  HRI or Home  Care Consult Complete Complete  Social Work Consult for Recovery Care Planning/Counseling  Complete  Palliative Care Screening Not Applicable Complete  Medication Review Oceanographer) Complete Complete

## 2024-04-20 NOTE — Discharge Instructions (Addendum)
 Use your oxygen, inhaler, nebulizer as per instruction. Patient will follow-up with PCP in [facility] in 1 to 2 weeks. Family to make appointment
